# Patient Record
Sex: Male | Born: 1935 | Race: White | Hispanic: No | Marital: Married
Health system: Midwestern US, Community
[De-identification: ages and names within clinical notes are randomized; demographics above are authoritative.]

## PROBLEM LIST (undated history)

## (undated) DIAGNOSIS — N401 Enlarged prostate with lower urinary tract symptoms: Secondary | ICD-10-CM

## (undated) DIAGNOSIS — M2041 Other hammer toe(s) (acquired), right foot: Secondary | ICD-10-CM

## (undated) DIAGNOSIS — R3915 Urgency of urination: Secondary | ICD-10-CM

## (undated) DIAGNOSIS — N138 Other obstructive and reflux uropathy: Secondary | ICD-10-CM

## (undated) DIAGNOSIS — I499 Cardiac arrhythmia, unspecified: Secondary | ICD-10-CM

## (undated) DIAGNOSIS — Z8679 Personal history of other diseases of the circulatory system: Secondary | ICD-10-CM

## (undated) DIAGNOSIS — G4733 Obstructive sleep apnea (adult) (pediatric): Secondary | ICD-10-CM

## (undated) DIAGNOSIS — K219 Gastro-esophageal reflux disease without esophagitis: Secondary | ICD-10-CM

## (undated) DIAGNOSIS — I4819 Other persistent atrial fibrillation: Secondary | ICD-10-CM

## (undated) DIAGNOSIS — I629 Nontraumatic intracranial hemorrhage, unspecified: Secondary | ICD-10-CM

## (undated) DIAGNOSIS — I340 Nonrheumatic mitral (valve) insufficiency: Secondary | ICD-10-CM

## (undated) HISTORY — DX: Nontraumatic intracranial hemorrhage, unspecified: I62.9

## (undated) HISTORY — DX: Cardiac arrhythmia, unspecified: I49.9

## (undated) HISTORY — DX: Obstructive sleep apnea (adult) (pediatric): G47.33

## (undated) HISTORY — DX: Gastro-esophageal reflux disease without esophagitis: K21.9

## (undated) HISTORY — PX: SHOULDER SURGERY: SHX246

## (undated) HISTORY — PX: CRANIOTOMY: SHX93

---

## 2009-01-27 LAB — METABOLIC PANEL, COMPREHENSIVE
A-G Ratio: 1.4 (ref 0.8–1.7)
ALT (SGPT): 36 U/L (ref 30–65)
AST (SGOT): 19 U/L (ref 15–37)
Albumin: 3.9 g/dL (ref 3.4–5.0)
Alk. phosphatase: 79 U/L (ref 50–136)
Anion gap: 6 mmol/L (ref 5–15)
BUN/Creatinine ratio: 16 (ref 12–20)
BUN: 16 MG/DL (ref 7–18)
Bilirubin, total: 0.8 MG/DL (ref 0.1–0.9)
CO2: 30 MMOL/L (ref 21–32)
Calcium: 8.7 MG/DL (ref 8.4–10.4)
Chloride: 98 MMOL/L — ABNORMAL LOW (ref 100–108)
Creatinine: 1 MG/DL (ref 0.6–1.3)
GFR est AA: >60 mL/min/{1.73_m2} (ref >60)
GFR est non-AA: >60 mL/min/{1.73_m2} (ref >60)
Globulin: 2.7 g/dL (ref 2.0–4.0)
Glucose: 111 MG/DL — ABNORMAL HIGH (ref 74–99)
Potassium: 4.4 MMOL/L (ref 3.5–5.5)
Protein, total: 6.6 g/dL (ref 6.4–8.2)
Sodium: 134 MMOL/L — ABNORMAL LOW (ref 136–145)

## 2009-01-27 LAB — CBC WITH AUTOMATED DIFF
ABS. BASOPHILS: 0 10*3/uL (ref 0.0–0.1)
ABS. EOSINOPHILS: 0.2 10*3/uL (ref 0.0–0.4)
ABS. LYMPHOCYTES: 1.4 10*3/uL (ref 0.8–3.5)
ABS. MONOCYTES: 0.5 10*3/uL (ref 0–1.0)
ABS. NEUTROPHILS: 3.4 10*3/uL (ref 1.8–8.0)
BASOPHILS: 1 % (ref 0–3)
EOSINOPHILS: 3 % (ref 0–5)
HCT: 37.9 % (ref 37.0–49.0)
HGB: 12.8 g/dL — ABNORMAL LOW (ref 13.0–16.0)
LYMPHOCYTES: 25 % (ref 20–51)
MCH: 31.9 PG (ref 25.0–35.0)
MCHC: 33.8 g/dL (ref 31.0–37.0)
MCV: 94.5 FL (ref 78.0–98.0)
MONOCYTES: 9 % (ref 2–9)
MPV: 6.9 FL — ABNORMAL LOW (ref 7.4–10.4)
NEUTROPHILS: 62 % (ref 42–75)
PLATELET: 167 10*3/uL (ref 130–400)
RBC: 4.01 M/uL — ABNORMAL LOW (ref 4.50–5.30)
RDW: 13.5 % (ref 11.5–14.5)
WBC: 5.6 10*3/uL (ref 4.5–13.0)

## 2009-01-29 NOTE — Op Note (Unsigned)
[facility]   [address]   [facility] [place] [address]   OPERATIVE REPORT    PATIENT: Jose Stafford, Jose Stafford  MRN: [id number] DATE: 01/29/2009  BILLING: [id number] LOCATION: [facility]  DICTATING: Chrissie Noa, MD        SURGEON: Dr. Chrissie Noa.    SURGICAL ASSISTANT: Caffie Damme    ANESTHESIOLOGIST: Allyne Gee    PREOPERATIVE DIAGNOSIS: Chronic subacromial impingement, left shoulder,  with full-thickness rotator cuff tear.    POSTOPERATIVE DIAGNOSIS: Chronic subacromial impingement, left shoulder,  with full-thickness rotator cuff tear.    OPERATION: Open repair of rotator cuff tear, left shoulder, with  subacromial decompression, including anterior acromioplasty.    PATHOLOGY: The patient had evidence of chronic impingement occurring on  the undersurface of the anterior acromion where significant bony prominence  was present. This had a sharp leading edge and projected anteromedially as  well anterolaterally at the corner. This was impinging over the area of the  supraspinatus insertion into the greater tuberosity, and a large,  full-thickness tear had occurred. The entire supraspinatus was torn in a  portion of the anterior aspect of the supraspinatus. It was also torn and  retracted medially, creating a U- shaped tear that had a bit of an L  component to it, as the anterior flap seemed more mobile than posterior.  The biceps tendon had ruptured. The subscapular tendon was intact as was  the teres minor. Bursa was thickened and scarred. Some mild adhesions were  present. Range of motion was well preserved. The visible part of the  articular surface looked in good condition.    PROCEDURE: The patient was adequately sedated under general anesthesia  after scalene block was placed in the left shoulder. He was placed in the  beach chair position on the operating table. The left shoulder was  manipulated to make sure full motion was available. We then prepped and   draped the left shoulder in the usual sterile fashion. Time-out was  performed to confirm the correct side surgery. Incision was made as a  two-and-a-half-inch incision starting at the anterior acromion and  extending point lateral to the coracoid process. Deltoid fascia was exposed  and incised starting at the [facility] joint and extending distally for an inch and  a half. Blunt dissection of the deltoid fibers exposed the underlying  bursa, which was incised. The anterior deltoid was released off the  anterior acromion for 1.5 cm to allow good visualization of the anterior  inferior surface of the acromion. The humeral head was depressed and a  1-inch flat osteotome was used to osteotomize the anterior and inferior  one-third of the acromion to create a flat smooth undersurface. Rasp was  used to further smooth this area to prevent further impingement. We  irrigated copiously with bacitracin solution. We then carried out lysis of  adhesions and bursectomy. Rotator cuff tendon tear was identified and the  margins mobilized for repair. It was felt that an L-type repair would be  best and we first created a shallow trough for the anatomic restoration of  the site of reinsertion of the tendon. Through the trough, we placed a  series of three drill holes with a 3/32 drill bit, going through the trough  and out through the lateral cortex of the greater tuberosity. We also  placed a tendon-to-tendon figure-of-eight suture of 0 Tevdek at the  proximal corner where we were going to bring the two margins together and  excised  a small amount of eschar of rotator cuff at that site to prevent  dog-ear. Finally, we inserted a HEALIX 5.5-mm bioabsorbable suture anchor  double loaded with #2 Orthocord sutures. We passed the sutures first  through bone tunnels and through tendon, each suture going through bone and  tendon twice; then we inserted the HEALIX anchor at the apex and began   tying sutures sequentially to gradually lay the tendon down into anatomic  position. Finally, we added a second tendon-to-tendon figure-of-eight  suture of 0 Tevdek, and a very stable anatomic repair was achieved. We  irrigated again copiously with diluted solution and hemostasis was achieved  throughout the case using electrocautery. The anterior deltoid was then  repaired back to the anterior acromion with 0 Tevdek sutures through bone.  The slit in the deltoid was repaired with 2-0 Monocryl as was subcutaneous  tissue, and the skin was closed with running subcuticular stitch of 3-0  Monocryl. Steri-Strips, Xeroform, bulky soft dressing and sling applied.  The patient was taken to the recovery room in satisfactory condition.  Sponge and needle count correct.    SPECIMEN: None.    ESTIMATED BLOOD LOSS: 30 mL.                           Date:______Time:______Signature________________________________   Chrissie Noa, MD        TF:wmx  D: 01/29/2009 11:17 A T: 01/29/2009 12:58 P  BY: [id number]  Job#: [id number] CScriptDoc #: [id number]    cc: Chrissie Noa, MD

## 2010-04-25 NOTE — Progress Notes (Signed)
Elevated PSA   020909 psa 1.9; 081009 psa 2.3; 020810 psa 1.6; 161096 psa 2.2; 030111 psa 3.1; 045409 psa 2.9;   10/04/2009 chart entry - pt has fu appt with me in September having been referred by Dr Burnadette Peter per his retirement; 77 yo with normal psa but abnormal psa velocity; has bph;   11/19/2009 office encounter - Preexam psa pending, pt to call if hasn't heard psa report by two weeks; exam shows 30 gm prostate, l>r wo nodules; reassured pt no sign of cancer; copy of psa to pcp planned;   Psa 2.9, reassuring letter to patient, copy of report to Dr Donette Larry       BPH w LUTS   Early 2000's microwave treatment - successful   [id number] Burnadette Peter) notes This is a follow-up visit for Jose Stafford who has an established diagnosis of BPH with obstruction. There has been improvement of his symptoms. He reports nocturia one time a night. His current medications includes no medications. He has not experienced Problems when on these medications. Since his last visit he notes less frequency less nocturia less urgency   [id number] Burnadette Peter) notes There has been improvement of his symptoms. He reports nocturia is rare now. He is bothered by some daytime urgency, which has become more noticeable over the past 6 mos. His current medications includes no medications. Since his last visit he notes less frequency less nocturia But more daytime urgency; PLAN Sanctura trial   There has been further improvement of his symptoms. He reports nocturia is rare now, and not more than once nightly. He was bothered by some daytime urgency, but began Sanctura with good improvement.   [id number] Burnadette Peter) notes His current medications includes no BPH medications. He continues on Reunion, with good results.    [id number] Burnadette Peter) notes There has been improvement of his symptoms. He reports nocturia one or two times a night. He is emptying well, with a PVR of 310 cc today. He is reasonably happy with his voiding despite this. His current medications includes Sanctura 60 mgm qHS. He has not experienced side effects on this medication.   [id number] [name]) notes irritable sxs controlled with sanctura, notes 40 gm benign prostate   10/04/2009 chart entry - chart reviewed, note pt has rx for sudafed and is on sanctura with 40 gm prostate - will discuss impact of this rx on bph sxs and increased pvr;   11/19/2009 office encounter - bph surveillance, reviewed risk of sudafed with bph - uses rarely; remains on sanctura xr on empty stomach, pleased with bladder control; discussed trial of cessation if he would like since doing; denies hematuria or dysuria; urine is dip and micro negative, exam shows 30 gm benign prostate l>r wo Nodules; ok to trial cesation of sanctura if he'd like       IBE   POST VOID RESIDUAL  10/22/2007  04/21/2008  10/20/2008    PVR  72ml  58cc  310 (A)    11/19/2009 office encounter - auass 4, pvr 45 cc, pt wo bother       Family Hx of Prostate Cancer   11/19/2009 office encounter - reports 3 brothers with pca

## 2010-04-27 ENCOUNTER — Encounter

## 2010-04-27 NOTE — Telephone Encounter (Addendum)
New insurance, sanctura too expensive, needs an alternative  DEBBIE, YOU KNOW THE DRILL!!!!!     Please get list of approved drugs in class of sanctura from his insurance company for [place] to choose from!!!!!!!!!!!!!!!!      Update his allergy list!

## 2010-04-30 NOTE — Telephone Encounter (Signed)
Jose Stafford has scanned the list for the pt and has put it on your desk.

## 2010-05-01 MED ORDER — SOLIFENACIN 10 MG TAB
10 mg | ORAL_TABLET | Freq: Every day | ORAL | Status: DC
Start: 2010-05-01 — End: 2010-05-06

## 2010-05-01 NOTE — Telephone Encounter (Signed)
Elevated PSA   020909 psa 1.9; 081009 psa 2.3; 020810 psa 1.6; 098119 psa 2.2; 030111 psa 3.1; 147829 psa 2.9;   10/04/2009 chart entry - pt has fu appt with me in September having been referred by Dr Burnadette Peter per his retirement; 77 yo with normal psa but abnormal psa velocity; has bph;   11/19/2009 office encounter - Preexam psa pending, pt to call if hasn't heard psa report by two weeks; exam shows 30 gm prostate, l>r wo nodules; reassured pt no sign of cancer; copy of psa to pcp planned;   Psa 2.9, reassuring letter to patient, copy of report to Dr Donette Larry       BPH w LUTS   Early 2000's microwave treatment - successful   [id number] Burnadette Peter) notes This is a follow-up visit for Jose Stafford who has an established diagnosis of BPH with obstruction. There has been improvement of his symptoms. He reports nocturia one time a night. His current medications includes no medications. He has not experienced Problems when on these medications. Since his last visit he notes less frequency less nocturia less urgency   [id number] Burnadette Peter) notes There has been improvement of his symptoms. He reports nocturia is rare now. He is bothered by some daytime urgency, which has become more noticeable over the past 6 mos. His current medications includes no medications. Since his last visit he notes less frequency less nocturia But more daytime urgency; PLAN Sanctura trial   There has been further improvement of his symptoms. He reports nocturia is rare now, and not more than once nightly. He was bothered by some daytime urgency, but began Sanctura with good improvement.   [id number] Burnadette Peter) notes His current medications includes no BPH medications. He continues on Reunion, with good results.    [id number] Burnadette Peter) notes There has been improvement of his symptoms. He reports nocturia one or two times a night. He is emptying well, with a PVR of 310 cc today. He is reasonably happy with his voiding despite this. His current medications includes Sanctura 60 mgm qHS. He has not experienced side effects on this medication.   [id number] [name]) notes irritable sxs controlled with sanctura, notes 40 gm benign prostate   10/04/2009 chart entry - chart reviewed, note pt has rx for sudafed and is on sanctura with 40 gm prostate - will discuss impact of this rx on bph sxs and increased pvr;   11/19/2009 office encounter - bph surveillance, reviewed risk of sudafed with bph - uses rarely; remains on sanctura xr on empty stomach, pleased with bladder control; discussed trial of cessation if he would like since doing; denies hematuria or dysuria; urine is dip and micro negative, exam shows 30 gm benign prostate l>r wo Nodules; ok to trial cesation of sanctura if he'd like   05/01/2010 chart entry ??? changed insurance needs alternative for sanctura since not on his insurance approved list, meds listed include oxybutynin, oxybutynin er 5, 10, 15, enablex, gelnique, vesicare; will go with vesicare 10 mg #30 rfx11, sent to [facility] pharmacy      IBE   POST VOID RESIDUAL  10/22/2007  04/21/2008  10/20/2008    PVR  72ml  58cc  310   11/19/2009 office encounter - auass 4, pvr 45 cc, pt wo bother       Family Hx of Prostate Cancer   11/19/2009 office encounter - reports 3 brothers with pca

## 2010-05-03 NOTE — Telephone Encounter (Signed)
Pt to pu meds

## 2010-05-04 ENCOUNTER — Telehealth

## 2010-05-04 NOTE — Telephone Encounter (Addendum)
vesicare is too expensive for him.  Can we try something else?

## 2010-05-06 MED ORDER — OXYBUTYNIN CHLORIDE SR 10 MG 24 HR TAB
10 mg | ORAL_TABLET | Freq: Every day | ORAL | Status: DC
Start: 2010-05-06 — End: 2010-05-27

## 2010-05-06 NOTE — Telephone Encounter (Signed)
Addended by: Leone Payor D on: 05/06/2010 05:57 AM     Modules accepted: Orders

## 2010-05-06 NOTE — Telephone Encounter (Signed)
Elevated PSA   020909 psa 1.9; 081009 psa 2.3; 020810 psa 1.6; 161096 psa 2.2; 030111 psa 3.1; 045409 psa 2.9;   10/04/2009 chart entry - pt has fu appt with me in September having been referred by Dr Burnadette Peter per his retirement; 77 yo with normal psa but abnormal psa velocity; has bph;   11/19/2009 office encounter - Preexam psa pending, pt to call if hasn't heard psa report by two weeks; exam shows 30 gm prostate, l>r wo nodules; reassured pt no sign of cancer; copy of psa to pcp planned;   Psa 2.9, reassuring letter to patient, copy of report to Dr Donette Larry       BPH w LUTS   Early 2000's microwave treatment - successful   [id number] Burnadette Peter) notes This is a follow-up visit for Jose Stafford who has an established diagnosis of BPH with obstruction. There has been improvement of his symptoms. He reports nocturia one time a night. His current medications includes no medications. He has not experienced Problems when on these medications. Since his last visit he notes less frequency less nocturia less urgency   [id number] Burnadette Peter) notes There has been improvement of his symptoms. He reports nocturia is rare now. He is bothered by some daytime urgency, which has become more noticeable over the past 6 mos. His current medications includes no medications. Since his last visit he notes less frequency less nocturia But more daytime urgency; PLAN Sanctura trial   There has been further improvement of his symptoms. He reports nocturia is rare now, and not more than once nightly. He was bothered by some daytime urgency, but began Sanctura with good improvement.   [id number] Burnadette Peter) notes His current medications includes no BPH medications. He continues on Reunion, with good results.    [id number] Burnadette Peter) notes There has been improvement of his symptoms. He reports nocturia one or two times a night. He is emptying well, with a PVR of 310 cc today. He is reasonably happy with his voiding despite this. His current medications includes Sanctura 60 mgm qHS. He has not experienced side effects on this medication.   [id number] [name]) notes irritable sxs controlled with sanctura, notes 40 gm benign prostate   10/04/2009 chart entry - chart reviewed, note pt has rx for sudafed and is on sanctura with 40 gm prostate - will discuss impact of this rx on bph sxs and increased pvr;   11/19/2009 office encounter - bph surveillance, reviewed risk of sudafed with bph - uses rarely; remains on sanctura xr on empty stomach, pleased with bladder control; discussed trial of cessation if he would like since doing; denies hematuria or dysuria; urine is dip and micro negative, exam shows 30 gm benign prostate l>r wo Nodules; ok to trial cesation of sanctura if he'd like   05/01/2010 chart entry ??? changed insurance needs alternative for sanctura since not on his insurance approved list, meds listed include oxybutynin, oxybutynin er 5, 10, 15, enablex, gelnique, vesicare; will go with vesicare 10 mg #30 rfx11, sent to [facility] pharmacy  05/06/2010 chart entry ??? vesicare too expensive, pt wants generic, pt advised of short term memory loss associated with oxybutynin; Rx oxbutynin er 10 mg daily #30 rfx11      IBE   POST VOID RESIDUAL  10/22/2007  04/21/2008  10/20/2008    PVR  72ml  58cc  310   11/19/2009 office encounter - auass 4, pvr 45 cc, pt wo bother       Family Hx of Prostate  Cancer   11/19/2009 office encounter - reports 3 brothers with pca

## 2010-05-10 NOTE — Telephone Encounter (Signed)
Pt aware of short term memory loss with oxybutynin.  Pt still going to try

## 2010-05-20 NOTE — Progress Notes (Signed)
psa drawn today w/o complications

## 2010-05-21 NOTE — Progress Notes (Addendum)
Elevated PSA   020909 psa 1.9; 081009 psa 2.3; 020810 psa 1.6; 454098 psa 2.2; 030111 psa 3.1; 119147 psa 2.9; 829562 psa 2.23;   10/04/2009 chart entry - pt has fu appt with me in September having been referred by Dr Burnadette Peter per his retirement; 77 yo with normal psa but abnormal psa velocity; has bph;   11/19/2009 office encounter - Preexam psa pending, pt to call if hasn't heard psa report by two weeks; exam shows 30 gm prostate, l>r wo nodules; reassured pt no sign of cancer; copy of psa to pcp planned;   Psa 2.9, reassuring letter to patient, copy of report to Dr Donette Larry   05/20/2010 medical assistant encounter - psa draw 2.23;  appt near future        BPH w LUTS   Early 2000's microwave treatment - successful   [id number] Burnadette Peter) notes This is a follow-up visit for Jose Stafford who has an established diagnosis of BPH with obstruction. There has been improvement of his symptoms. He reports nocturia one time a night. His current medications includes no medications. He has not experienced Problems when on these medications. Since his last visit he notes less frequency less nocturia less urgency   [id number] Burnadette Peter) notes There has been improvement of his symptoms. He reports nocturia is rare now. He is bothered by some daytime urgency, which has become more noticeable over the past 6 mos. His current medications includes no medications. Since his last visit he notes less frequency less nocturia But more daytime urgency; PLAN Sanctura trial   There has been further improvement of his symptoms. He reports nocturia is rare now, and not more than once nightly. He was bothered by some daytime urgency, but began Sanctura with good improvement.   [id number] Burnadette Peter) notes His current medications includes no BPH medications. He continues on Reunion, with good results.    [id number] Burnadette Peter) notes There has been improvement of his symptoms. He reports nocturia one or two times a night. He is emptying well, with a PVR of 310 cc today. He is reasonably happy with his voiding despite this. His current medications includes Sanctura 60 mgm qHS. He has not experienced side effects on this medication.   [id number] [name]) notes irritable sxs controlled with sanctura, notes 40 gm benign prostate   10/04/2009 chart entry - chart reviewed, note pt has rx for sudafed and is on sanctura with 40 gm prostate - will discuss impact of this rx on bph sxs and increased pvr;   11/19/2009 office encounter - bph surveillance, reviewed risk of sudafed with bph - uses rarely; remains on sanctura xr on empty stomach, pleased with bladder control; discussed trial of cessation if he would like since doing; denies hematuria or dysuria; urine is dip and micro negative, exam shows 30 gm benign prostate l>r wo Nodules; ok to trial cesation of sanctura if he'd like   05/01/2010 chart entry ??? changed insurance needs alternative for sanctura since not on his insurance approved list, meds listed include oxybutynin, oxybutynin er 5, 10, 15, enablex, gelnique, vesicare; will go with vesicare 10 mg #30 rfx11, sent to [facility] pharmacy  05/06/2010 chart entry ??? vesicare too expensive, pt wants generic, pt advised of short term memory loss associated with oxybutynin; Rx oxbutynin er 10 mg daily #30 rfx11      IBE   POST VOID RESIDUAL  10/22/2007  04/21/2008  10/20/2008    PVR  72ml  58cc  310   11/19/2009 office encounter -  auass 4, pvr 45 cc, pt wo bother       Family Hx of Prostate Cancer   11/19/2009 office encounter - reports 3 brothers with pca

## 2010-05-27 LAB — AMB POC URINALYSIS DIP STICK AUTO W/ MICRO (MICRO RESULTS)
Bacteria (UA POC): 0
Bilirubin (UA POC): NEGATIVE
Blood (UA POC): NEGATIVE
Epithelial cells (UA POC): 0
Glucose (UA POC): NEGATIVE
Ketones (UA POC): NEGATIVE
Leukocyte esterase (UA POC): NEGATIVE
Nitrites (UA POC): NEGATIVE
Protein (UA POC): NEGATIVE mg/dL
RBCs (UA POC): 0
Specific gravity (UA POC): 1.02 (ref 1.001–1.035)
Urobilinogen (UA POC): 0.2
WBCs (UA POC): 0
pH (UA POC): 5.5 (ref 4.6–8.0)

## 2010-05-27 NOTE — Patient Instructions (Signed)
MyChart Activation    Thank you for requesting access to MyChart. Please follow the instructions below to securely access and download your online medical record. MyChart allows you to send messages to your doctor, view your test results, renew your prescriptions, schedule appointments, and more.    How Do I Sign Up?    1. In your internet browser, go to [email or web address].  2. Click on the First Time User? Click Here link in the Sign In box. You will see the New Member Sign Up page.  3. Enter your MyChart Access Code exactly as it appears below. You will not need to use this code after you???ve completed the sign-up process. If you do not sign up before the expiration date, you must request a new code.    MyChart Access Code: [email or web address]  Expires: 08/25/2010  9:01 AM (This is the date your MyChart access code will expire)    4. Enter the last four digits of your Social Security Number (xxxx) and Date of Birth (mm/dd/yyyy) as indicated and click Submit. You will be taken to the next sign-up page.  5. Create a MyChart ID. This will be your MyChart login ID and cannot be changed, so think of one that is secure and easy to remember.  6. Create a MyChart password. You can change your password at any time.  7. Enter your Password Reset Question and Answer. This can be used at a later time if you forget your password.   8. Enter your e-mail address. You will receive e-mail notification when new information is available in MyChart.  9. Click Sign Up. You can now view and download portions of your medical record.  10. Click the Download Summary menu link to download a portable copy of your medical information.    Additional Information    If you have questions, please call [phone]. Remember, MyChart is NOT to be used for urgent needs. For medical emergencies, dial 911.

## 2010-05-27 NOTE — Progress Notes (Signed)
Jose Heys, MD  [facility]  [address]  [place], [place] [address]  [phone]      ASSESSMENT/PLAN    Encounter Diagnoses   Name Primary?   ??? BPH w urinary obs/LUTS Yes   ??? Urgency of urination    ??? Family hx of prostate cancer    ??? Elevated PSA      Assessment:  psa appropriate for age and prostate size in pt anxious about his family hx of pca; bph sxs controlled with vesicare  Plan:  Titrate to minimum effective dose of vesicare; six month surveillance continue      HISTORY OF PRESENT ILLNESS  He is currently 77 y.o. and his primary care provider is Jose Stafford.    Elevated PSA   020909 psa 1.9; 081009 psa 2.3; 020810 psa 1.6; 914782 psa 2.2; 030111 psa 3.1; 956213 psa 2.9; 086578 psa 2.23;   10/04/2009 chart entry - pt has fu appt with me in September having been referred by Jose Stafford per his retirement; 77 yo with normal psa but abnormal psa velocity; has bph;   11/19/2009 office encounter - Preexam psa pending, pt to call if hasn't heard psa report by two weeks; exam shows 30 gm prostate, l>r wo nodules; reassured pt no sign of cancer; copy of psa to pcp planned;   Psa 2.9, reassuring letter to patient, copy of report to Jose Stafford   05/20/2010 medical assistant encounter - psa draw 2.23;  appt near future  05/27/2010 office encounter  - elev psa surveillance in pt with family hx of sig pca; psa range is 1.9 - 3.1, present 2.23; pt anxious about his psa and family hx wants six month checks; urine is micro benign; exam shows 30 gm prostate r>l wo nodules or induration; reassured pt no sign of prostate cancer but at risk due to age and family hx; reassured him pca death unlikely for him;       BPH w LUTS   Early 2000's microwave treatment - successful    [id number] Burnadette Stafford) notes This is a follow-up visit for Jose Stafford who has an established diagnosis of BPH with obstruction. There has been improvement of his symptoms. He reports nocturia one time a night. His current medications includes no medications. He has not experienced Problems when on these medications. Since his last visit he notes less frequency less nocturia less urgency   [id number] Burnadette Stafford) notes There has been improvement of his symptoms. He reports nocturia is rare now. He is bothered by some daytime urgency, which has become more noticeable over the past 6 mos. His current medications includes no medications. Since his last visit he notes less frequency less nocturia But more daytime urgency; PLAN Sanctura trial   There has been further improvement of his symptoms. He reports nocturia is rare now, and not more than once nightly. He was bothered by some daytime urgency, but began Sanctura with good improvement.   [id number] Burnadette Stafford) notes His current medications includes no BPH medications. He continues on Reunion, with good results.   [id number] Burnadette Stafford) notes There has been improvement of his symptoms. He reports nocturia one or two times a night. He is emptying well, with a PVR of 310 cc today. He is reasonably happy with his voiding despite this. His current medications includes Sanctura 60 mgm qHS. He has not experienced side effects on this medication.   [id number] Jose Stafford) notes irritable sxs controlled with sanctura, notes 40 gm  benign prostate   10/04/2009 chart entry - chart reviewed, note pt has rx for sudafed and is on sanctura with 40 gm prostate - will discuss impact of this rx on bph sxs and increased pvr;    11/19/2009 office encounter - bph surveillance, reviewed risk of sudafed with bph - uses rarely; remains on sanctura xr on empty stomach, pleased with bladder control; discussed trial of cessation if he would like since doing; denies hematuria or dysuria; urine is dip and micro negative, exam shows 30 gm benign prostate l>r wo Nodules; ok to trial cesation of sanctura if he'd like   05/01/2010 chart entry ??? changed insurance needs alternative for sanctura since not on his insurance approved list, meds listed include oxybutynin, oxybutynin er 5, 10, 15, enablex, gelnique, vesicare; will go with vesicare 10 mg #30 rfx11, sent to [facility] pharmacy  05/06/2010 chart entry ??? vesicare too expensive, pt wants generic, pt advised of short term memory loss associated with oxybutynin; Rx oxbutynin er 10 mg daily #30 rfx11  05/27/2010 office encounter  - pt opted to stay w vesicare and avoid side effects with the oxybutynin; his AUA symptom score (detailed in the documents flowsheet) is only 2 on vesicare; pt pleased; will titrate dose to every other and then every third; whatever minimum dosage is effective;       IBE   POST VOID RESIDUAL  10/22/2007  04/21/2008  10/20/2008    PVR  72ml  58cc  310   11/19/2009 office encounter - auass 4, pvr 45 cc, pt wo bother   05/27/2010 office encounter  - auass 2, under influence of vesicare      Family Hx of Prostate Cancer   11/19/2009 office encounter - reports 3 brothers with pca      PAST MEDICAL HISTORY    Current Outpatient Prescriptions   Medication Sig Dispense Refill   ??? azelastine (ASTELIN) 137 mcg nasal spray 1 Spray two (2) times a day. Use in each nostril as directed        ??? ipratropium (ATROVENT) 0.02 % nebulizer solution 0.5 mg by Nebulization route as needed.         ??? cyanocobalamin 1,000 mcg tablet Take 1,000 mcg by mouth daily.         ??? Calcium Carbonate-Vit D3-Min (CALTRATE 600+D PLUS MINERALS) 600 mg (1,500 mg)-400 unit Chew Take  by mouth.          ??? ferrous sulfate (IRON) 325 mg (65 mg elemental iron) tablet Take  by mouth Daily (before breakfast).         ??? ascorbic acid (VITAMIN C) 250 mg tablet Take  by mouth.         ??? clonazePAM (KLONOPIN) 0.5 mg tablet Take  by mouth nightly as needed.         ??? esomeprazole (NEXIUM) 20 mg capsule Take 40 mg by mouth daily.         ??? simvastatin (ZOCOR) 10 mg tablet Take  by mouth nightly.         ??? montelukast (SINGULAIR) 10 mg tablet Take 10 mg by mouth daily.         ??? Cetirizine (ZYRTEC) 10 mg Cap Take  by mouth.         ??? citalopram (CELEXA) 40 mg tablet Take 40 mg by mouth daily.         ??? MULTIVITAMIN (MULTIPLE VITAMINS PO) Take  by mouth.         ???  aspirin delayed-release 81 mg tablet Take  by mouth daily.         ??? solifenacin (VESICARE) 10 mg tablet Take 10 mg by mouth daily.         ??? GUAIFENESIN (MUCINEX PO) Take  by mouth.           Allergies   Allergen Reactions   ??? Sulfa (Sulfonamide Antibiotics) Unknown (comments)         REVIEW OF SYSTEMS  Complete systems review with patient who reports no change since last complete ROS dated 030812 and scanned to chart  Constitutional: Neg for fever, neg for chills, neg for weight loss, neg for change in appetite, neg for changes in energy level  Eyes: Neg for visual disturbance, neg for pain, neg for discharge  ENT: Neg for difficulty speaking, neg for pain with swallowing, neg for hearing difficulty  Respiratory: Neg for Shortness of breath, chronic cough with sputum, neg for hemoptysis  Cardiac: Neg for chest pain, neg for rapid heartbeat, occl irregular heartbeat  GU: Neg for history of kidney stones, neg for frequent UTI, neg for flank pain  GI: Neg for constipation, neg for diarrhea, neg for melena, neg for hematochezia  MSK: Neg for bone pain, some aging muscular weakness, neg for muscular tenderness  Skin: Neg for skin conditions, neg for skin rashes  Neurologic: Neg for focal weakness, neg for numbness, neg for seizures   Psychiatric: Neg for history of psychiatric illness  Endocrine: Neg for diabetes, neg for thyroid disease, neg for excessive urination, neg for excessive thirst, neg for heat intolerance  Lymph: Neg for frequent infections, has easy bruising, neg for  lymph node enlargement    PHYSICAL EXAM  BP 90/40   Ht 5\' 8"  (1.727 m)   Wt 166 lb (75.297 kg)   BMI 25.24 kg/m2    GENERAL APPEARANCE:  77 y.o. Caucasian  male alert, cooperative, no distress, appears stated age not obese;  ABDOMEN:  rounded soft, non-tender. Bowel sounds normal. No masses,  no organomegaly  BACK:  symmetric, no curvature. ROM normal. No CVA tenderness.  GENITOURINARY:    Penis:  normal, noncircumcised  Scrotum:  normal  Testes:  normal, no masses, size 20cc  Epididymides:  normal  Anus/Perineum:  within normal limits  Sphincter tone:  within normal limits  Rectum:  negative without mass, lesions or tenderness    Prostate:  30 gm prostate r>l wo nodules or induration  Seminal vesicles:  nonpalpable  LYMPHATIC:  Cervical, supraclavicular, and axillary nodes normal.    GU DATA    Results for orders placed in visit on 05/27/10   AMB POC URINALYSIS DIP STICK AUTO W/ MICRO (MICRO RESULTS)       Component Value Range    Color Yellow  (none)     Clarity Clear  (none)     Glucose Negative  (none)     Bilirubin Negative  (none)     Ketones Negative  (none)     Spec.Grav. 1.020  1.001 - 1.035     Blood Negative  (none)     pH 5.5  4.6 - 8.0     Protein n  ZOX:[id number](JW/JX)    Urobilinogen 0.2 mg/dL      Nitrites Negative  (none)     Leukocyte esterase Negative  (none)     Epithelial cells 0      WBCs 0      RBCs 0      Bacteria 0  Crystals        Other           AUA Assessment Score:  AUA Score: 2 ;    AUA Bother Rating: Pleased   His  AUASS symptoms are detailed in the document flow sheet

## 2010-11-30 NOTE — Progress Notes (Signed)
Blood drawn by venipuncture for psa

## 2010-12-02 LAB — PSA, DIAGNOSTIC (PROSTATE SPECIFIC AG): Prostate Specific Ag: 2.5 ng/mL (ref 0.00–4.00)

## 2010-12-05 NOTE — Progress Notes (Signed)
Elevated PSA   020909 psa 1.9; 081009 psa 2.3; 020810 psa 1.6; 161096 psa 2.2; 030111 psa 3.1; 045409 psa 2.9; 811914 psa 2.23; 782956 psa 2.5;    10/04/2009 chart entry - pt has fu appt with me in September having been referred by Dr Burnadette Peter per his retirement; 77 yo with normal psa but abnormal psa velocity; has bph;   11/19/2009 office encounter - Preexam psa pending, pt to call if hasn't heard psa report by two weeks; exam shows 30 gm prostate, l>r wo nodules; reassured pt no sign of cancer; copy of psa to pcp planned;   Psa 2.9, reassuring letter to patient, copy of report to Dr Donette Larry   05/20/2010 medical assistant encounter - psa draw 2.23;  appt near future  05/27/2010 office encounter  - elev psa surveillance in pt with family hx of sig pca; psa range is 1.9 - 3.1, present 2.23; pt anxious about his psa and family hx wants six month checks; urine is micro benign; exam shows 30 gm prostate r>l wo nodules or induration; reassured pt no sign of prostate cancer but at risk due to age and family hx; reassured him pca death unlikely for him;   Dec 04, 2010 medical assistant encounter - psa draw, reassuring stable value of 2.5; appt near future      BPH w LUTS   Early 2000's microwave treatment - successful   [id number] Burnadette Peter) notes This is a follow-up visit for Jose Stafford who has an established diagnosis of BPH with obstruction. There has been improvement of his symptoms. He reports nocturia one time a night. His current medications includes no medications. He has not experienced Problems when on these medications. Since his last visit he notes less frequency less nocturia less urgency    [id number] Burnadette Peter) notes There has been improvement of his symptoms. He reports nocturia is rare now. He is bothered by some daytime urgency, which has become more noticeable over the past 6 mos. His current medications includes no medications. Since his last visit he notes less frequency less nocturia But more daytime urgency; PLAN Sanctura trial   There has been further improvement of his symptoms. He reports nocturia is rare now, and not more than once nightly. He was bothered by some daytime urgency, but began Sanctura with good improvement.   [id number] Burnadette Peter) notes His current medications includes no BPH medications. He continues on Reunion, with good results.   [id number] Burnadette Peter) notes There has been improvement of his symptoms. He reports nocturia one or two times a night. He is emptying well, with a PVR of 310 cc today. He is reasonably happy with his voiding despite this. His current medications includes Sanctura 60 mgm qHS. He has not experienced side effects on this medication.   [id number] [name]) notes irritable sxs controlled with sanctura, notes 40 gm benign prostate   10/04/2009 chart entry - chart reviewed, note pt has rx for sudafed and is on sanctura with 40 gm prostate - will discuss impact of this rx on bph sxs and increased pvr;   11/19/2009 office encounter - bph surveillance, reviewed risk of sudafed with bph - uses rarely; remains on sanctura xr on empty stomach, pleased with bladder control; discussed trial of cessation if he would like since doing; denies hematuria or dysuria; urine is dip and micro negative, exam shows 30 gm benign prostate l>r wo Nodules; ok to trial cesation of sanctura if he'd like   05/01/2010 chart entry ??? changed insurance needs alternative for sanctura  since not on his insurance approved list, meds listed include oxybutynin, oxybutynin er 5, 10, 15, enablex, gelnique, vesicare; will go with vesicare 10 mg #30 rfx11, sent to [facility] pharmacy   05/06/2010 chart entry ??? vesicare too expensive, pt wants generic, pt advised of short term memory loss associated with oxybutynin; Rx oxbutynin er 10 mg daily #30 rfx11  05/27/2010 office encounter  - pt opted to stay w vesicare and avoid side effects with the oxybutynin; his AUA symptom score (detailed in the documents flowsheet) is only 2 on vesicare; pt pleased; will titrate dose to every other and then every third; whatever minimum dosage is effective;       IBE   POST VOID RESIDUAL  10/22/2007  04/21/2008  10/20/2008    PVR  72ml  58cc  310   11/19/2009 office encounter - auass 4, pvr 45 cc, pt wo bother   05/27/2010 office encounter  - auass 2, under influence of vesicare      Family Hx of Prostate Cancer   11/19/2009 office encounter - reports 3 brothers with pca

## 2010-12-06 LAB — AMB POC URINALYSIS DIP STICK AUTO W/ MICRO (MICRO RESULTS)
Bacteria (UA POC): NEGATIVE
Bilirubin (UA POC): NEGATIVE
Blood (UA POC): NEGATIVE
Glucose (UA POC): NEGATIVE
Ketones (UA POC): NEGATIVE
Leukocyte esterase (UA POC): NEGATIVE
Nitrites (UA POC): NEGATIVE
Protein (UA POC): NEGATIVE mg/dL
RBCs (UA POC): NEGATIVE
Specific gravity (UA POC): 1.01 (ref 1.001–1.035)
Urobilinogen (UA POC): 0.2
WBCs (UA POC): NEGATIVE
pH (UA POC): 6.5 (ref 4.6–8.0)

## 2010-12-06 LAB — AMB POC PVR, MEAS,POST-VOID RES,US,NON-IMAGING: PVR: 102 cc

## 2010-12-06 NOTE — Progress Notes (Signed)
Jose Heys, MD  [facility] [place]  [address]  [place], [place] [address]  [phone]      ASSESSMENT/PLAN    Encounter Diagnoses   Name Primary?   ??? BPH w urinary obs/LUTS Yes   ??? Urgency of urination    ??? Family hx of prostate cancer        Assessment:  Strong fam hx of pca - brothers, son nephews, high risk for disease from fam hx; psa variable but has stabilized at 2.5;   Plan:  psa in six months, if ok then will recheck psa and exam a year from now;       HISTORY OF PRESENT ILLNESS  He is currently 77 y.o. and his primary care provider is HERNANI VALERIO.    Elevated PSA   020909 psa 1.9; 081009 psa 2.3; 020810 psa 1.6; 914782 psa 2.2; 030111 psa 3.1; 956213 psa 2.9; 086578 psa 2.23; 469629 psa 2.5;    10/04/2009 chart entry - pt has fu appt with me in September having been referred by Dr Burnadette Peter per his retirement; 77 yo with normal psa but abnormal psa velocity; has bph;   11/19/2009 office encounter - Preexam psa pending, pt to call if hasn't heard psa report by two weeks; exam shows 30 gm prostate, l>r wo nodules; reassured pt no sign of cancer; copy of psa to pcp planned;   Psa 2.9, reassuring letter to patient, copy of report to Dr Donette Larry   05/20/2010 medical assistant encounter - psa draw 2.23;  appt near future  05/27/2010 office encounter  - elev psa surveillance in pt with family hx of sig pca; psa range is 1.9 - 3.1, present 2.23; pt anxious about his psa and family hx wants six month checks; urine is micro benign; exam shows 30 gm prostate r>l wo nodules or induration; reassured pt no sign of prostate cancer but at risk due to age and family hx; reassured him pca death unlikely for him;   2010-12-26 medical assistant encounter - psa draw, reassuring stable value of 2.5; appt near future   12/06/2010 office encounter  - elevated psa hx in pt with strong fam hx of pca - brothers and his son and 3 nephews all with pca; reviewed his psa hx which is reassuring with stable value of 2.5; pt wo sxs; feels great - even though 8 he wishes active surveillance; exam shows 30 gm prostate r>l wo nodules or induration; will continue six month surveillance      BPH w LUTS   Early 2000's microwave treatment - successful   [id number] Burnadette Peter) notes This is a follow-up visit for Jose Stafford who has an established diagnosis of BPH with obstruction. There has been improvement of his symptoms. He reports nocturia one time a night. His current medications includes no medications. He has not experienced Problems when on these medications. Since his last visit he notes less frequency less nocturia less urgency   [id number] Burnadette Peter) notes There has been improvement of his symptoms. He reports nocturia is rare now. He is bothered by some daytime urgency, which has become more noticeable over the past 6 mos. His current medications includes no medications. Since his last visit he notes less frequency less nocturia But more daytime urgency; PLAN Sanctura trial   There has been further improvement of his symptoms. He reports nocturia is rare now, and not more than once nightly. He was bothered by some daytime urgency, but began Reunion  with good improvement.   [id number] Burnadette Peter) notes His current medications includes no BPH medications. He continues on Reunion, with good results.   [id number] Burnadette Peter) notes There has been improvement of his symptoms. He reports nocturia one or two times a night. He is emptying well, with a PVR of 310 cc today. He is reasonably happy with his voiding despite this. His current medications includes Sanctura 60 mgm qHS. He has not experienced side effects on this medication.   [id number] [name]) notes irritable sxs controlled with sanctura, notes 40 gm benign prostate    10/04/2009 chart entry - chart reviewed, note pt has rx for sudafed and is on sanctura with 40 gm prostate - will discuss impact of this rx on bph sxs and increased pvr;   11/19/2009 office encounter - bph surveillance, reviewed risk of sudafed with bph - uses rarely; remains on sanctura xr on empty stomach, pleased with bladder control; discussed trial of cessation if he would like since doing; denies hematuria or dysuria; urine is dip and micro negative, exam shows 30 gm benign prostate l>r wo Nodules; ok to trial cesation of sanctura if he'd like   05/01/2010 chart entry ??? changed insurance needs alternative for sanctura since not on his insurance approved list, meds listed include oxybutynin, oxybutynin er 5, 10, 15, enablex, gelnique, vesicare; will go with vesicare 10 mg #30 rfx11, sent to [facility] pharmacy  05/06/2010 chart entry ??? vesicare too expensive, pt wants generic, pt advised of short term memory loss associated with oxybutynin; Rx oxbutynin er 10 mg daily #30 rfx11  05/27/2010 office encounter  - pt opted to stay w vesicare and avoid side effects with the oxybutynin; his AUA symptom score (detailed in the documents flowsheet) is only 2 on vesicare; pt pleased; will titrate dose to every other and then every third; whatever minimum dosage is effective;   12/06/2010 office encounter  - weaned off vesicare, his AUA symptom score (detailed in the documents flowsheet) is 7; denies hematuria or dysuria; ua dip and micro negative;       IBE   POST VOID RESIDUAL  10/22/2007  04/21/2008  10/20/2008    PVR  72ml  58cc  310   11/19/2009 office encounter - auass 4, pvr 45 cc, pt wo bother   05/27/2010 office encounter  - auass 2, under influence of vesicare  12/06/2010 office encounter  - auass 7, has stopped vesicare, pvr 10cc      Family Hx of Prostate Cancer   11/19/2009 office encounter - reports 3 brothers with pca  12/06/2010 office encounter  - now his 66 yo son has been diagnosed with pca;     PAST MEDICAL HISTORY     Current Outpatient Prescriptions   Medication Sig Dispense Refill   ??? omeprazole (PRILOSEC OTC) 20 mg tablet Take 20 mg by mouth daily.         ??? azelastine (ASTELIN) 137 mcg nasal spray 1 Spray two (2) times a day. Use in each nostril as directed        ??? cyanocobalamin 1,000 mcg tablet Take 1,000 mcg by mouth daily.         ??? Calcium Carbonate-Vit D3-Min (CALTRATE 600+D PLUS MINERALS) 600 mg (1,500 mg)-400 unit Chew Take  by mouth.         ??? simvastatin (ZOCOR) 10 mg tablet Take  by mouth nightly.         ??? montelukast (SINGULAIR) 10 mg tablet Take 10 mg by mouth daily.         ???  Cetirizine (ZYRTEC) 10 mg Cap Take  by mouth.         ??? aspirin delayed-release 81 mg tablet Take  by mouth daily.         ??? GUAIFENESIN (MUCINEX PO) Take  by mouth.           Allergies   Allergen Reactions   ??? Sulfa(Sulfonamide Antibiotics) Unknown (comments)       REVIEW OF SYSTEMS  Complete systems review with patient who reports no change since last complete ROS dated 030812 and scanned to chart  Constitutional: Neg for fever, neg for chills, neg for weight loss, neg for change in appetite, neg for changes in energy level  Eyes: Neg for visual disturbance, neg for pain, neg for discharge  ENT: Neg for difficulty speaking, neg for pain with swallowing, neg for hearing difficulty  Respiratory: Neg for Shortness of breath, chronic cough with sputum, neg for hemoptysis  Cardiac: Neg for chest pain, neg for rapid heartbeat, occl irregular heartbeat  GU: Neg for history of kidney stones, neg for frequent UTI, neg for flank pain  GI: Neg for constipation, neg for diarrhea, neg for melena, neg for hematochezia  MSK: Neg for bone pain, some aging muscular weakness, neg for muscular tenderness  Skin: Neg for skin conditions, neg for skin rashes  Neurologic: Neg for focal weakness, neg for numbness, neg for seizures  Psychiatric: Neg for history of psychiatric illness   Endocrine: Neg for diabetes, neg for thyroid disease, neg for excessive urination, neg for excessive thirst, neg for heat intolerance  Lymph: Neg for frequent infections, has easy bruising, neg for  lymph node enlargement    PHYSICAL EXAM  BP 120/80   Ht 5\' 7"  (1.702 m)   Wt 158 lb (71.668 kg)   BMI 24.75 kg/m2  Discussed the patient's BMI with him.  The BMI follow up plan is as follows: BMI is in an acceptable range.    GENERAL APPEARANCE:  77 y.o. Caucasian  male alert, cooperative, no distress, appears stated age not obese;  ABDOMEN:  flat soft, non-tender. Bowel sounds normal. No masses,  no organomegaly  BACK:  symmetric, no curvature. ROM normal. No CVA tenderness.  GENITOURINARY:    Penis:  normal, noncircumcised  Scrotum:  normal  Testes:  normal, no masses, size 20cc  Epididymides:  normal  Anus/Perineum:  within normal limits  Sphincter tone:  within normal limits  Rectum:  negative without mass, lesions or tenderness    Prostate:  30 gm prostate r>l wo nodules or induration  Seminal vesicles:  nonpalpable  LYMPHATIC:  Cervical, supraclavicular, and axillary nodes normal.    GU DATA    Results for orders placed in visit on 12/06/10   AMB POC PVR, MEAS,POST-VOID RES,US,NON-IMAGING       Component Value Range    PVR 102     AMB POC URINALYSIS DIP STICK AUTO W/ MICRO (MICRO RESULTS)       Component Value Range    Color Yellow  (none)    Clarity Clear  (none)    Glucose Negative  (none)    Bilirubin Negative  (none)    Ketones Negative  (none)    Spec.Grav. 1.010  1.001 - 1.035    Blood Negative  (none)    pH 6.5  4.6 - 8.0    Protein n  Negative mg/dL    Urobilinogen 0.2 mg/dL      Nitrites Negative  (none)    Leukocyte esterase Negative  (  none)    Epithelial cells        WBCs n      RBCs n      Bacteria n      Crystals        Other           AUA Assessment Score:  AUA Score: 7 ;    AUA Bother Rating: Pleased   His  AUASS symptoms are detailed in the document flow sheet

## 2011-06-06 NOTE — Progress Notes (Signed)
psa drawn today w/o complications

## 2011-06-06 NOTE — Patient Instructions (Signed)
MyChart Activation    Thank you for requesting access to MyChart. Please follow the instructions below to securely access and download your online medical record. MyChart allows you to send messages to your doctor, view your test results, renew your prescriptions, schedule appointments, and more.    How Do I Sign Up?    1. In your internet browser, go to [email or web address]  2. Click on the First Time User? Click Here link in the Sign In box. You will be redirect to the New Member Sign Up page.  3. Enter your MyChart Access Code exactly as it appears below. You will not need to use this code after you???ve completed the sign-up process. If you do not sign up before the expiration date, you must request a new code.    MyChart Access Code: Not generated  Current MyChart Status: Active (This is the date your MyChart access code will expire)    4. Enter the last four digits of your Social Security Number (xxxx) and Date of Birth (mm/dd/yyyy) as indicated and click Submit. You will be taken to the next sign-up page.  5. Create a MyChart ID. This will be your MyChart login ID and cannot be changed, so think of one that is secure and easy to remember.  6. Create a MyChart password. You can change your password at any time.  7. Enter your Password Reset Question and Answer. This can be used at a later time if you forget your password.   8. Enter your e-mail address. You will receive e-mail notification when new information is available in MyChart.  9. Click Sign Up. You can now view and download portions of your medical record.  10. Click the Download Summary menu link to download a portable copy of your medical information.    Additional Information    If you have questions, please visit the Frequently Asked Questions section of the MyChart website at [email or web address] Remember, MyChart is NOT to be used for urgent needs. For medical emergencies, dial 911.

## 2011-06-07 LAB — PSA, DIAGNOSTIC (PROSTATE SPECIFIC AG): Prostate Specific Ag: 2.2 ng/mL (ref 0.0–4.0)

## 2011-06-09 NOTE — Progress Notes (Signed)
LVM for pt to call office for lab results/[id number]

## 2011-06-09 NOTE — Progress Notes (Signed)
Elevated PSA   020909 psa 1.9; 081009 psa 2.3; 020810 psa 1.6; 213086 psa 2.2; 030111 psa 3.1; 578469 psa 2.9; 629528 psa 2.23; 413244 psa 2.5; 010272 psa 2.2;     10/04/2009 chart entry - pt has fu appt with me in September having been referred by Dr Jose Stafford per his retirement; 78 yo with normal psa but abnormal psa velocity; has bph;   11/19/2009 office encounter - Preexam psa pending, pt to call if hasn't heard psa report by two weeks; exam shows 30 gm prostate, l>r wo nodules; reassured pt no sign of cancer; copy of psa to pcp planned;   Psa 2.9, reassuring letter to patient, copy of report to Dr Jose Stafford   05/20/2010 medical assistant encounter - psa draw 2.23;  appt near future  05/27/2010 office encounter  - elev psa surveillance in pt with family hx of sig pca; psa range is 1.9 - 3.1, present 2.23; pt anxious about his psa and family hx wants six month checks; urine is micro benign; exam shows 30 gm prostate r>l wo nodules or induration; reassured pt no sign of prostate cancer but at risk due to age and family hx; reassured him pca death unlikely for him;   12-26-10 medical assistant encounter - psa draw, reassuring stable value of 2.5; appt near future  12/06/2010 office encounter  - elevated psa hx in pt with strong fam hx of pca - brothers and his son and 3 nephews all with pca; reviewed his psa hx which is reassuring with stable value of 2.5; pt wo sxs; feels great - even though 31 he wishes active surveillance; exam shows 30 gm prostate r>l wo nodules or induration; will continue six month surveillance  06/06/2011 medical assistant encounter - psa 2.2, cw, let pt know his psa improved normal at 2.2; arrange appt with me in six months for " ua surveillance bph luts fam hx pca " - i recommend no psa testing; if he insists on psa testing, then get psa level two week prior;         BPH w LUTS   Early 2000's microwave treatment - successful   [id number] Jose Stafford) notes This is a follow-up visit for Jose Stafford who  has an established diagnosis of BPH with obstruction. There has been improvement of his symptoms. He reports nocturia one time a night. His current medications includes no medications. He has not experienced Problems when on these medications. Since his last visit he notes less frequency less nocturia less urgency   [id number] Jose Stafford) notes There has been improvement of his symptoms. He reports nocturia is rare now. He is bothered by some daytime urgency, which has become more noticeable over the past 6 mos. His current medications includes no medications. Since his last visit he notes less frequency less nocturia But more daytime urgency; PLAN Sanctura trial   There has been further improvement of his symptoms. He reports nocturia is rare now, and not more than once nightly. He was bothered by some daytime urgency, but began Sanctura with good improvement.   [id number] Jose Stafford) notes His current medications includes no BPH medications. He continues on Reunion, with good results.   [id number] Jose Stafford) notes There has been improvement of his symptoms. He reports nocturia one or two times a night. He is emptying well, with a PVR of 310 cc today. He is reasonably happy with his voiding despite this. His current medications includes Sanctura 60 mgm qHS. He has not experienced side effects on this  medication.   [id number] Jose Stafford) notes irritable sxs controlled with sanctura, notes 40 gm benign prostate   10/04/2009 chart entry - chart reviewed, note pt has rx for sudafed and is on sanctura with 40 gm prostate - will discuss impact of this rx on bph sxs and increased pvr;   11/19/2009 office encounter - bph surveillance, reviewed risk of sudafed with bph - uses rarely; remains on sanctura xr on empty stomach, pleased with bladder control; discussed trial of cessation if he would like since doing; denies hematuria or dysuria; urine is dip and micro negative, exam shows 30 gm benign prostate l>r wo Nodules; ok to trial cesation of sanctura if  he'd like   05/01/2010 chart entry ??? changed insurance needs alternative for sanctura since not on his insurance approved list, meds listed include oxybutynin, oxybutynin er 5, 10, 15, enablex, gelnique, vesicare; will go with vesicare 10 mg #30 rfx11, sent to [facility] pharmacy  05/06/2010 chart entry ??? vesicare too expensive, pt wants generic, pt advised of short term memory loss associated with oxybutynin; Rx oxbutynin er 10 mg daily #30 rfx11  05/27/2010 office encounter  - pt opted to stay w vesicare and avoid side effects with the oxybutynin; his AUA symptom score (detailed in the documents flowsheet) is only 2 on vesicare; pt pleased; will titrate dose to every other and then every third; whatever minimum dosage is effective;   12/06/2010 office encounter  - weaned off vesicare, his AUA symptom score (detailed in the documents flowsheet) is 7; denies hematuria or dysuria; ua dip and micro negative;       IBE   POST VOID RESIDUAL  10/22/2007  04/21/2008  10/20/2008    PVR  72ml  58cc  310   11/19/2009 office encounter - auass 4, pvr 45 cc, pt wo bother   05/27/2010 office encounter  - auass 2, under influence of vesicare  12/06/2010 office encounter  - auass 7, has stopped vesicare, pvr 10cc      Family Hx of Prostate Cancer   11/19/2009 office encounter - reports 3 brothers with pca  12/06/2010 office encounter  - now his 37 yo son has been diagnosed with pca;

## 2011-06-14 NOTE — Progress Notes (Signed)
PSA results to pt and appt scheduled/[id number]

## 2011-06-14 NOTE — Progress Notes (Signed)
LVM for pt to contact office for lab results and appt/[id number]

## 2011-12-08 LAB — AMB POC URINALYSIS DIP STICK AUTO W/ MICRO (MICRO RESULTS)
Bacteria (UA POC): 0
Bilirubin (UA POC): NEGATIVE
Blood (UA POC): NEGATIVE
Glucose (UA POC): NEGATIVE
Ketones (UA POC): NEGATIVE
Leukocyte esterase (UA POC): NEGATIVE
Nitrites (UA POC): NEGATIVE
Protein (UA POC): NEGATIVE mg/dL
RBCs (UA POC): 0
Specific gravity (UA POC): 1.015 (ref 1.001–1.035)
Urobilinogen (UA POC): 0.2 (ref 0.2–1)
WBCs (UA POC): 0
pH (UA POC): 6 (ref 4.6–8.0)

## 2011-12-08 NOTE — Addendum Note (Signed)
Addended by: Clarene Critchley A on: 12/08/2011 02:51 PM     Modules accepted: Orders

## 2011-12-08 NOTE — Progress Notes (Signed)
Blood drawn by venipuncture for psa

## 2011-12-08 NOTE — Progress Notes (Addendum)
Jose Stafford  DOB [birth year]    [name], MD  [facility], [facility]  [address], [address]  [place], [place] [address]  [phone]      ASSESSMENT/PLAN    Encounter Diagnoses   Name Primary?   ??? BPH w urinary obs/LUTS Yes   ??? Urgency of urination    ??? Family hx of prostate cancer        Assessment:  BPH wo bother, hx of elevated psa, strong fam hx of pca; stable exam  Plan:  Pt requests psa in six months, exam in a year      HISTORY OF PRESENT ILLNESS  He is currently 78 y.o. and his primary care provider is [name], MD.    Chief Complaint   Patient presents with   ??? Prostate Cancer     Elevated PSA   020909 psa 1.9; 081009 psa 2.3; 020810 psa 1.6; 914782 psa 2.2; 030111 psa 3.1; 956213 psa 2.9; 086578 psa 2.23; 469629 psa 2.5; 528413 psa 2.2; 244010 psa 2.2;     10/04/2009 chart entry - pt has fu appt with me in September having been referred by Dr Burnadette Peter per his retirement; 78 yo with normal psa but abnormal psa velocity; has bph;   11/19/2009 office encounter - Preexam psa pending, pt to call if hasn't heard psa report by two weeks; exam shows 30 gm prostate, l>r wo nodules; reassured pt no sign of cancer; copy of psa to pcp planned;   Psa 2.9, reassuring letter to patient, copy of report to Dr Donette Larry   05/20/2010 medical assistant encounter - psa draw 2.23;  appt near future  05/27/2010 office encounter  - elev psa surveillance in pt with family hx of sig pca; psa range is 1.9 - 3.1, present 2.23; pt anxious about his psa and family hx wants six month checks; urine is micro benign; exam shows 30 gm prostate r>l wo nodules or induration; reassured pt no sign of prostate cancer but at risk due to age and family hx; reassured him pca death unlikely for him;   2010-12-07 medical assistant encounter - psa draw, reassuring stable value of 2.5; appt near future  12/06/2010 office encounter  - elevated psa hx in pt with strong fam hx of pca - brothers and his son and 3 nephews all with  pca; reviewed his psa hx which is reassuring with stable value of 2.5; pt wo sxs; feels great - even though 76 he wishes active surveillance; exam shows 30 gm prostate r>l wo nodules or induration; will continue six month surveillance  06/06/2011 medical assistant encounter - psa 2.2, cw, let pt know his psa improved normal at 2.2; arrange appt with me in six months for " ua surveillance bph luts fam hx pca " - i recommend no psa testing; if he insists on psa testing, then get psa level two week prior;   12/08/2011 office encounter  - extensive fam hx of pca; here for fu exam, psa's have been normal recently so pt deferred psa testing; exam shows 30 gm prostate R>L  wo nodules; continue six months surveillance  psa was drawn for some reason, psa 2.2; no more psa's, letter to patient      BPH w LUTS   Early 2000's microwave treatment - successful   [id number] Burnadette Peter) notes This is a follow-up visit for [name] who has an established diagnosis of BPH with obstruction. There has been improvement of his symptoms.  He reports nocturia one time a night. His current medications includes no medications. He has not experienced Problems when on these medications. Since his last visit he notes less frequency less nocturia less urgency   [id number] Burnadette Peter) notes There has been improvement of his symptoms. He reports nocturia is rare now. He is bothered by some daytime urgency, which has become more noticeable over the past 6 mos. His current medications includes no medications. Since his last visit he notes less frequency less nocturia But more daytime urgency; PLAN Sanctura trial   There has been further improvement of his symptoms. He reports nocturia is rare now, and not more than once nightly. He was bothered by some daytime urgency, but began Sanctura with good improvement.   [id number] Burnadette Peter) notes His current medications includes no BPH medications. He continues on Reunion, with good results.   [id number] Burnadette Peter) notes There has  been improvement of his symptoms. He reports nocturia one or two times a night. He is emptying well, with a PVR of 310 cc today. He is reasonably happy with his voiding despite this. His current medications includes Sanctura 60 mgm qHS. He has not experienced side effects on this medication.   [id number] [name]) notes irritable sxs controlled with sanctura, notes 40 gm benign prostate   10/04/2009 chart entry - chart reviewed, note pt has rx for sudafed and is on sanctura with 40 gm prostate - will discuss impact of this rx on bph sxs and increased pvr;   11/19/2009 office encounter - bph surveillance, reviewed risk of sudafed with bph - uses rarely; remains on sanctura xr on empty stomach, pleased with bladder control; discussed trial of cessation if he would like since doing; denies hematuria or dysuria; urine is dip and micro negative, exam shows 30 gm benign prostate l>r wo Nodules; ok to trial cesation of sanctura if he'd like   05/01/2010 chart entry ??? changed insurance needs alternative for sanctura since not on his insurance approved list, meds listed include oxybutynin, oxybutynin er 5, 10, 15, enablex, gelnique, vesicare; will go with vesicare 10 mg #30 rfx11, sent to [facility] pharmacy  05/06/2010 chart entry ??? vesicare too expensive, pt wants generic, pt advised of short term memory loss associated with oxybutynin; Rx oxbutynin er 10 mg daily #30 rfx11  05/27/2010 office encounter  - pt opted to stay w vesicare and avoid side effects with the oxybutynin; his AUA symptom score (detailed in the documents flowsheet) is only 2 on vesicare; pt pleased; will titrate dose to every other and then every third; whatever minimum dosage is effective;   12/06/2010 office encounter  - weaned off vesicare, his AUA symptom score (detailed in the documents flowsheet) is 7; denies hematuria or dysuria; ua dip and micro negative;   12/08/2011 office encounter  - bph surveillance, no meds; pt satisfied with voiding; his AUA symptom score  (detailed in the documents flowsheet) is 4, denies hematuria or dysuria; ua dip and micro negative; occl urgency      IBE   POST VOID RESIDUAL  10/22/2007  04/21/2008  10/20/2008    PVR  72ml  58cc  310   11/19/2009 office encounter - auass 4, pvr 45 cc, pt wo bother   05/27/2010 office encounter  - auass 2, under influence of vesicare  12/06/2010 office encounter  - auass 7, has stopped vesicare, pvr 10cc  12/08/2011 office encounter  - auass 4 wo bother, no meds      Family Hx of Prostate Cancer  11/19/2009 office encounter - reports 3 brothers with pca  12/06/2010 office encounter  - now his 63 yo son has been diagnosed with pca;     PAST MEDICAL HISTORY    Current Outpatient Prescriptions   Medication Sig Dispense Refill   ??? acetaminophen (TYLENOL) 325 mg tablet Take  by mouth every four (4) hours as needed.       ??? sertraline (ZOLOFT) 25 mg tablet Take  by mouth daily.       ??? CEPHALEXIN (KEFLEX PO) Take  by mouth.       ??? omeprazole (PRILOSEC OTC) 20 mg tablet Take 20 mg by mouth daily.         ??? azelastine (ASTELIN) 137 mcg nasal spray 1 Spray two (2) times a day. Use in each nostril as directed        ??? simvastatin (ZOCOR) 10 mg tablet Take  by mouth nightly.         ??? montelukast (SINGULAIR) 10 mg tablet Take 10 mg by mouth daily.         ??? Cetirizine (ZYRTEC) 10 mg Cap Take  by mouth.         ??? aspirin delayed-release 81 mg tablet Take  by mouth daily.         ??? GUAIFENESIN (MUCINEX PO) Take  by mouth.           Allergies   Allergen Reactions   ??? Sulfa (Sulfonamide Antibiotics) Unknown (comments)       REVIEW OF SYSTEMS  Complete systems review with patient who reports no change TODAY [phone] since last complete ROS dated 030812 and scanned to chart  Constitutional: Neg for fever, neg for chills, neg for weight loss, neg for change in appetite, neg for changes in energy level  Eyes: Neg for visual disturbance, neg for pain, neg for discharge  ENT: Neg for difficulty speaking, neg for pain with swallowing, neg for  hearing difficulty  Respiratory: Neg for Shortness of breath, chronic cough with sputum, neg for hemoptysis  Cardiac: Neg for chest pain, neg for rapid heartbeat, occl irregular heartbeat  GU: Neg for history of kidney stones, neg for frequent UTI, neg for flank pain  GI: Neg for constipation, neg for diarrhea, neg for melena, neg for hematochezia  MSK: Neg for bone pain, some aging muscular weakness, neg for muscular tenderness  Skin: Neg for skin conditions, neg for skin rashes  Neurologic: Neg for focal weakness, neg for numbness, neg for seizures  Psychiatric: Neg for history of psychiatric illness  Endocrine: Neg for diabetes, neg for thyroid disease, neg for excessive urination, neg for excessive thirst, neg for heat intolerance  Lymph: Neg for frequent infections, has easy bruising, neg for  lymph node enlargement    PHYSICAL EXAM  BP 102/62   Ht 5\' 5"  (1.651 m)   Wt 163 lb (73.936 kg)   BMI 27.12 kg/m2  Discussed the patient's BMI with him.  The BMI follow up plan is as follows: BMI OK.    GENERAL APPEARANCE:  78 y.o. Caucasian  male alert, cooperative, no distress, appears stated age not obese;  ABDOMEN:  flat soft, non-tender. Bowel sounds normal. No masses,  no organomegaly  BACK:  symmetric, no curvature. ROM normal. No CVA tenderness.  GENITOURINARY:    Penis:  normal, noncircumcised  Scrotum:  normal  Testes:  normal, no masses, size 20cc  Epididymides:  normal  Anus/Perineum:  within normal limits  Sphincter tone:  within normal  limits  Rectum:  negative without mass, lesions or tenderness    Prostate:  30 gm prostate r>l wo nodules or induration  Seminal vesicles:  nonpalpable  LYMPHATIC:  Cervical, supraclavicular, and axillary nodes normal.    GU DATA    Results for orders placed in visit on 12/08/11   AMB POC URINALYSIS DIP STICK AUTO W/ MICRO (MICRO RESULTS)       Component Value Range    Color (UA POC) Yellow  (none)    Clarity (UA POC) Clear  (none)    Glucose (UA POC) Negative  (none)     Bilirubin (UA POC) Negative  (none)    Ketones (UA POC) Negative  (none)    Specific gravity (UA POC) 1.015  1.001 - 1.035    Blood (UA POC) Negative  (none)    pH (UA POC) 6.0  4.6 - 8.0    Protein (UA POC) Negative  Negative mg/dL    Urobilinogen (UA POC) 0.2 mg/dL  0.2 - 1    Nitrites (UA POC) Negative  (none)    Leukocyte esterase (UA POC) Negative  (none)    Epithelial cells (UA POC)        WBCs (UA POC) 0      RBCs (UA POC) 0      Bacteria (UA POC) 0      Crystals (UA POC)        Other (UA POC)           AUA Assessment Score:  AUA Score: 4 ;    AUA Bother Rating: Pleased   His  AUASS symptoms are detailed in the document flow sheet

## 2011-12-09 LAB — PSA, DIAGNOSTIC (PROSTATE SPECIFIC AG): Prostate Specific Ag: 2.2 ng/mL (ref 0.0–4.0)

## 2012-06-06 NOTE — Progress Notes (Signed)
After verifying pt's name and date of birth   psa drawn today  and sent to [facility] w/o incident   pt tolerated procedure well. (600.01)

## 2012-06-07 LAB — PSA, DIAGNOSTIC (PROSTATE SPECIFIC AG): Prostate Specific Ag: 2.4 ng/mL (ref 0.0–4.0)

## 2012-06-07 NOTE — Progress Notes (Signed)
Elevated PSA   020909 psa 1.9; 081009 psa 2.3; 020810 psa 1.6; 147829 psa 2.2; 030111 psa 3.1; 562130 psa 2.9; 865784 psa 2.23; 696295 psa 2.5; 284132 psa 2.2; 440102 psa 2.2;  725366 psa 2.4;   10/04/2009 chart entry - pt has fu appt with me in September having been referred by Dr Burnadette Peter per his retirement; 79 yo with normal psa but abnormal psa velocity; has bph;   11/19/2009 office encounter - Preexam psa pending, pt to call if hasn't heard psa report by two weeks; exam shows 30 gm prostate, l>r wo nodules; reassured pt no sign of cancer; copy of psa to pcp planned;   Psa 2.9, reassuring letter to patient, copy of report to Dr Donette Larry   05/20/2010 medical assistant encounter - psa draw 2.23;  appt near future  05/27/2010 office encounter  - elev psa surveillance in pt with family hx of sig pca; psa range is 1.9 - 3.1, present 2.23; pt anxious about his psa and family hx wants six month checks; urine is micro benign; exam shows 30 gm prostate r>l wo nodules or induration; reassured pt no sign of prostate cancer but at risk due to age and family hx; reassured him pca death unlikely for him;   12/09/2010 medical assistant encounter - psa draw, reassuring stable value of 2.5; appt near future  12/06/2010 office encounter  - elevated psa hx in pt with strong fam hx of pca - brothers and his son and 3 nephews all with pca; reviewed his psa hx which is reassuring with stable value of 2.5; pt wo sxs; feels great - even though 16 he wishes active surveillance; exam shows 30 gm prostate r>l wo nodules or induration; will continue six month surveillance  06/06/2011 medical assistant encounter - psa 2.2, cw, let pt know his psa improved normal at 2.2; arrange appt with me in six months for " ua surveillance bph luts fam hx pca " - i recommend no psa testing; if he insists on psa testing, then get psa level two week prior;   12/08/2011 office encounter  - extensive fam hx of pca; here for fu exam, psa's have been normal recently  so pt deferred psa testing; exam shows 30 gm prostate R>L  wo nodules; continue six months surveillance  psa was drawn for some reason, psa 2.2; no more psa's, letter to patient      BPH w LUTS   Early 2000's microwave treatment - successful   [id number] Burnadette Peter) notes This is a follow-up visit for Jose Stafford who has an established diagnosis of BPH with obstruction. There has been improvement of his symptoms. He reports nocturia one time a night. His current medications includes no medications. He has not experienced Problems when on these medications. Since his last visit he notes less frequency less nocturia less urgency   [id number] Burnadette Peter) notes There has been improvement of his symptoms. He reports nocturia is rare now. He is bothered by some daytime urgency, which has become more noticeable over the past 6 mos. His current medications includes no medications. Since his last visit he notes less frequency less nocturia But more daytime urgency; PLAN Sanctura trial   There has been further improvement of his symptoms. He reports nocturia is rare now, and not more than once nightly. He was bothered by some daytime urgency, but began Sanctura with good improvement.   [id number] Burnadette Peter) notes His current medications includes no BPH medications. He continues on Reunion, with good results.  [id number] Burnadette Peter) notes There has been improvement of his symptoms. He reports nocturia one or two times a night. He is emptying well, with a PVR of 310 cc today. He is reasonably happy with his voiding despite this. His current medications includes Sanctura 60 mgm qHS. He has not experienced side effects on this medication.   [id number] [name]) notes irritable sxs controlled with sanctura, notes 40 gm benign prostate   10/04/2009 chart entry - chart reviewed, note pt has rx for sudafed and is on sanctura with 40 gm prostate - will discuss impact of this rx on bph sxs and increased pvr;   11/19/2009 office encounter - bph surveillance, reviewed  risk of sudafed with bph - uses rarely; remains on sanctura xr on empty stomach, pleased with bladder control; discussed trial of cessation if he would like since doing; denies hematuria or dysuria; urine is dip and micro negative, exam shows 30 gm benign prostate l>r wo Nodules; ok to trial cesation of sanctura if he'd like   05/01/2010 chart entry ??? changed insurance needs alternative for sanctura since not on his insurance approved list, meds listed include oxybutynin, oxybutynin er 5, 10, 15, enablex, gelnique, vesicare; will go with vesicare 10 mg #30 rfx11, sent to [facility] pharmacy  05/06/2010 chart entry ??? vesicare too expensive, pt wants generic, pt advised of short term memory loss associated with oxybutynin; Rx oxbutynin er 10 mg daily #30 rfx11  05/27/2010 office encounter  - pt opted to stay w vesicare and avoid side effects with the oxybutynin; his AUA symptom score (detailed in the documents flowsheet) is only 2 on vesicare; pt pleased; will titrate dose to every other and then every third; whatever minimum dosage is effective;   12/06/2010 office encounter  - weaned off vesicare, his AUA symptom score (detailed in the documents flowsheet) is 7; denies hematuria or dysuria; ua dip and micro negative;   12/08/2011 office encounter  - bph surveillance, no meds; pt satisfied with voiding; his AUA symptom score (detailed in the documents flowsheet) is 4, denies hematuria or dysuria; ua dip and micro negative; occl urgency      IBE   POST VOID RESIDUAL  10/22/2007  04/21/2008  10/20/2008    PVR  72ml  58cc  310   11/19/2009 office encounter - auass 4, pvr 45 cc, pt wo bother   05/27/2010 office encounter  - auass 2, under influence of vesicare  12/06/2010 office encounter  - auass 7, has stopped vesicare, pvr 10cc  12/08/2011 office encounter  - auass 4 wo bother, no meds      Family Hx of Prostate Cancer   11/19/2009 office encounter - reports 3 brothers with pca  12/06/2010 office encounter  - now his 29 yo son has been  diagnosed with pca;

## 2012-06-18 LAB — AMB POC URINALYSIS DIP STICK AUTO W/ MICRO (MICRO RESULTS)
Bacteria (UA POC): NEGATIVE
Bilirubin (UA POC): NEGATIVE
Blood (UA POC): NEGATIVE
Glucose (UA POC): NEGATIVE
Ketones (UA POC): NEGATIVE
Leukocyte esterase (UA POC): NEGATIVE
Nitrites (UA POC): NEGATIVE
Protein (UA POC): NEGATIVE mg/dL
RBCs (UA POC): NEGATIVE
Specific gravity (UA POC): 1.015 (ref 1.001–1.035)
Urobilinogen (UA POC): 0.2 (ref 0.2–1)
WBCs (UA POC): NEGATIVE
pH (UA POC): 7 (ref 4.6–8.0)

## 2012-06-18 LAB — AMB POC PVR, MEAS,POST-VOID RES,US,NON-IMAGING: PVR: 163 cc

## 2012-06-18 NOTE — Progress Notes (Signed)
Jose Stafford  DOB [birth year]    Jose Heys, MD  [facility] [place]  [address]  [place], [place] [address]  [phone]      ASSESSMENT/PLAN               Encounter Diagnoses   Name Primary?   ??? BPH w urinary obs/LUTS Yes   ??? Urgency of urination    ??? Family hx of prostate cancer    ??? Incomplete bladder emptying        Assessment:  Weakening of urinary stream, will check pvr before he leaves; sig fear of pca with strong fam hx of pca death - insists on continued six month surveillance  Plan:  pvr check today; fu in six month with psa prior; (pvr after seeing patient was around 269, had pt double void and pvr 163 cc - will recheck pvr in a month)       HISTORY OF PRESENT ILLNESS  He is currently 79 y.o. and his primary care provider is Jose Cage, MD.    Chief Complaint   Patient presents with   ??? Prostate Cancer       Elevated PSA   020909 psa 1.9; 081009 psa 2.3; 020810 psa 1.6; 914782 psa 2.2; 956213 psa 3.1; 086578 psa 2.9; 469629 psa 2.23; 528413 psa 2.5; 244010 psa 2.2; 272536 psa 2.2;  644034 psa 2.4;   10/04/2009 chart entry - pt has fu appt with me in September having been referred by Dr Burnadette Peter per his retirement; 79 yo with normal psa but abnormal psa velocity; has bph;   11/19/2009 office encounter - Preexam psa pending, pt to call if hasn't heard psa report by two weeks; exam shows 30 gm prostate, l>r wo nodules; reassured pt no sign of cancer; copy of psa to pcp planned;   Psa 2.9, reassuring letter to patient, copy of report to Dr Donette Larry   05/20/2010 medical assistant encounter - psa draw 2.23;  appt near future  05/27/2010 office encounter  - elev psa surveillance in pt with family hx of sig pca; psa range is 1.9 - 3.1, present 2.23; pt anxious about his psa and family hx wants six month checks; urine is micro benign; exam shows 30 gm prostate r>l wo nodules or induration; reassured pt no sign of prostate cancer but at risk due to age and family hx; reassured  him pca death unlikely for him;   12/20/2010 medical assistant encounter - psa draw, reassuring stable value of 2.5; appt near future  12/06/2010 office encounter  - elevated psa hx in pt with strong fam hx of pca - brothers and his son and 3 nephews all with pca; reviewed his psa hx which is reassuring with stable value of 2.5; pt wo sxs; feels great - even though 39 he wishes active surveillance; exam shows 30 gm prostate r>l wo nodules or induration; will continue six month surveillance  06/06/2011 medical assistant encounter - psa 2.2, cw, let pt know his psa improved normal at 2.2; arrange appt with me in six months for " ua surveillance bph luts fam hx pca " - i recommend no psa testing; if he insists on psa testing, then get psa level two week prior;   12/08/2011 office encounter  - extensive fam hx of pca; here for fu exam, psa's have been normal recently so pt deferred psa testing; exam shows 30 gm prostate R>L  wo nodules; continue six months surveillance  psa was drawn for some reason, psa 2.2; no more psa's, letter to patient  06/18/2012 office encounter  - pt insists on continued psa checks in addition to prostate exam due to strong family hx of pca death; exam shows 30 gm prostate l>r wo nodules or induration; psa currently is 2.4, reviewed psa hx with im, recheck in six months;       BPH w LUTS   Early 2000's microwave treatment - successful   [id number] Burnadette Peter) notes This is a follow-up visit for Jose Stafford who has an established diagnosis of BPH with obstruction. There has been improvement of his symptoms. He reports nocturia one time a night. His current medications includes no medications. He has not experienced Problems when on these medications. Since his last visit he notes less frequency less nocturia less urgency   [id number] Burnadette Peter) notes There has been improvement of his symptoms. He reports nocturia is rare now. He is bothered by some daytime urgency, which has become more noticeable over the past 6  mos. His current medications includes no medications. Since his last visit he notes less frequency less nocturia But more daytime urgency; PLAN Sanctura trial   There has been further improvement of his symptoms. He reports nocturia is rare now, and not more than once nightly. He was bothered by some daytime urgency, but began Sanctura with good improvement.   [id number] Burnadette Peter) notes His current medications includes no BPH medications. He continues on Reunion, with good results.   [id number] Burnadette Peter) notes There has been improvement of his symptoms. He reports nocturia one or two times a night. He is emptying well, with a PVR of 310 cc today. He is reasonably happy with his voiding despite this. His current medications includes Sanctura 60 mgm qHS. He has not experienced side effects on this medication.   [id number] [name]) notes irritable sxs controlled with sanctura, notes 40 gm benign prostate   10/04/2009 chart entry - chart reviewed, note pt has rx for sudafed and is on sanctura with 40 gm prostate - will discuss impact of this rx on bph sxs and increased pvr;   11/19/2009 office encounter - bph surveillance, reviewed risk of sudafed with bph - uses rarely; remains on sanctura xr on empty stomach, pleased with bladder control; discussed trial of cessation if he would like since doing; denies hematuria or dysuria; urine is dip and micro negative, exam shows 30 gm benign prostate l>r wo Nodules; ok to trial cesation of sanctura if he'd like   05/01/2010 chart entry ??? changed insurance needs alternative for sanctura since not on his insurance approved list, meds listed include oxybutynin, oxybutynin er 5, 10, 15, enablex, gelnique, vesicare; will go with vesicare 10 mg #30 rfx11, sent to [facility] pharmacy  05/06/2010 chart entry ??? vesicare too expensive, pt wants generic, pt advised of short term memory loss associated with oxybutynin; Rx oxbutynin er 10 mg daily #30 rfx11  05/27/2010 office encounter  - pt opted to stay w vesicare  and avoid side effects with the oxybutynin; his AUA symptom score (detailed in the documents flowsheet) is only 2 on vesicare; pt pleased; will titrate dose to every other and then every third; whatever minimum dosage is effective;   12/06/2010 office encounter  - weaned off vesicare, his AUA symptom score (detailed in the documents flowsheet) is 7; denies hematuria or dysuria; ua dip and micro negative;   12/08/2011 office encounter  - bph surveillance, no meds; pt satisfied with voiding; his AUA symptom  score (detailed in the documents flowsheet) is 4, denies hematuria or dysuria; ua dip and micro negative; occl urgency  06/18/2012 office encounter  - not on any meds right now, stream a little slower; his AUA symptom score (detailed in the documents flowsheet) is 9; denies hematuria or dysuria; ua dip and micro negative; exam shows 30 gm benign prostate wo nodules or induration; will check pvr; pt defers any meds for now; if beomes bothered will call and we will trial flomax;       IBE   POST VOID RESIDUAL  10/22/2007  04/21/2008  10/20/2008    PVR  72ml  58cc  310   11/19/2009 office encounter - auass 4, pvr 45 cc, pt wo bother   05/27/2010 office encounter  - auass 2, under influence of vesicare  12/06/2010 office encounter  - auass 7, has stopped vesicare, pvr 10cc  12/08/2011 office encounter  - auass 4 wo bother, no meds  06/18/2012 office encounter  - auass 9, stream a little slower; pvr 163 cc (initially 269, had pt double void and pvr 163 cc) will recheck pvr in a month;       Family Hx of Prostate Cancer   11/19/2009 office encounter - reports 3 brothers with pca  12/06/2010 office encounter  - now his 30 yo son has been diagnosed with pca;     PAST MEDICAL HISTORY    Past Surgical History   Procedure Laterality Date   ??? Hx orthopaedic     ??? Pr cardiac surg procedure unlist         Past Medical History   Diagnosis Date   ??? Elevated PSA    ??? Family hx of prostate cancer    ??? Urgency of urination    ??? Benign localized  hyperplasia of prostate with urinary obstruction and other lower urinary tract symptoms (LUTS)(600.21)      Current Outpatient Prescriptions   Medication Sig Dispense Refill   ??? lansoprazole (PREVACID) 15 mg capsule Take  by mouth Daily (before breakfast).       ??? ipratropium (ATROVENT) 0.03 % nasal spray 2 Sprays every twelve (12) hours.       ??? cyanocobalamin (VITAMIN B-12) 500 mcg tablet Take 500 mcg by mouth daily.       ??? Ferrous Fumarate 325 mg (106 mg iron) tab Take  by mouth.       ??? ASCORBATE CALCIUM (VITAMIN C PO) Take  by mouth.       ??? acetaminophen (TYLENOL) 325 mg tablet Take  by mouth every four (4) hours as needed.       ??? sertraline (ZOLOFT) 25 mg tablet Take  by mouth daily.       ??? simvastatin (ZOCOR) 10 mg tablet Take  by mouth nightly.         ??? montelukast (SINGULAIR) 10 mg tablet Take 10 mg by mouth daily.         ??? Cetirizine (ZYRTEC) 10 mg Cap Take  by mouth.         ??? aspirin delayed-release 81 mg tablet Take  by mouth daily.         ??? GUAIFENESIN (MUCINEX PO) Take  by mouth.           Allergies   Allergen Reactions   ??? Milk Contact Dermatitis   ??? Sulfa (Sulfonamide Antibiotics) Unknown (comments)     Family History   Problem Relation Age of Onset   ??? Cancer     ???  Hypertension         Social  History   Smoking status   ??? Never Smoker    Smokeless tobacco   ??? Never Used     History   Alcohol Use   ??? 0.5 oz/week   ??? 1 Glasses of wine per week        REVIEW OF SYSTEMS    Constitutional: Fever: No  Skin: Rash:   HEENT: Hearing difficulty: No  Eyes: Blurred vision: No  Cardiovascular: Chest pain: No  Respiratory: Shortness of breath: No  Gastrointestinal: Nausea/vomiting: No  Musculoskeletal: Back pain: No  Neurological: Weakness: Yes  Psychological: Memory loss: Yes  Comments/additional findings:       PHYSICAL EXAM  BP 102/62   Ht 5\' 7"  (1.702 m)   Wt 160 lb (72.576 kg)   BMI 25.05 kg/m2    The BMI follow up plan is as follows: bmi ok    GENERAL APPEARANCE:  79 y.o. Caucasian  male alert,  cooperative, no distress, appears stated age not obese;  ABDOMEN:  flat soft, non-tender. Bowel sounds normal. No masses,  no organomegaly  BACK:  symmetric, no curvature. ROM normal. No CVA tenderness.  GENITOURINARY:    Penis:  normal, noncircumcised  Scrotum:  normal  Testes:  normal, no masses, size 20cc  Epididymides:  normal  Anus/Perineum:  within normal limits  Sphincter tone:  within normal limits  Rectum:  negative without mass, lesions or tenderness    Prostate:  30 gm prostate l>r wo nodules or induration  Seminal vesicles:  nonpalpable  LYMPHATIC:  Cervical, supraclavicular, and axillary nodes normal.      GU DATA    Results for orders placed in visit on 06/18/12   AMB POC URINALYSIS DIP STICK AUTO W/ MICRO (MICRO RESULTS)       Result Value Range    Color (UA POC) Yellow      Clarity (UA POC) Clear      Glucose (UA POC) Negative  Negative    Bilirubin (UA POC) Negative  Negative    Ketones (UA POC) Negative  Negative    Specific gravity (UA POC) 1.015  1.001 - 1.035    Blood (UA POC) Negative  Negative    pH (UA POC) 7.0  4.6 - 8.0    Protein (UA POC) Negative  Negative mg/dL    Urobilinogen (UA POC) 0.2 mg/dL  0.2 - 1    Nitrites (UA POC) Negative  Negative    Leukocyte esterase (UA POC) Negative  Negative    Epithelial cells (UA POC)        WBCs (UA POC) n      RBCs (UA POC) n      Bacteria (UA POC) n      Crystals (UA POC)    Negative    Other (UA POC)       AMB POC PVR, MEAS,POST-VOID RES,US,NON-IMAGING       Result Value Range    PVR 163         AUA Symptom Score 06/18/2012   Over the past month how often have you had the sensation that your baldder was not completely empty after you finished urinating? 1   Over the past month, how often have had to urinate again less than 2 hours after you last finished urinating? 0   Over the past month, how often have you found you stopped and started again several times when you urinated? 1   Over  the past month, how often have you found it dificult to postpone  urination? 1   Over the past month, how often have you had a weak urinary stream? 3   Over the past month, how often have you had to push or strain to begin urinating? 2   Over the past month, how many times did you most tipically get up to urinate from the time you went to bed at night until the time you got up in the moring? 1   AUA Score 9   If you were to spend the rest of your life with your urinary condition the way it is now, how would you feel about that? Pleased       His  AUASS symptoms are detailed in the document flow sheet

## 2012-07-18 LAB — AMB POC UROFLOWMETRY
AVG FLOW RATE: 2 ml/Seconds
FLOW TIME: 64 Seconds
MAX FLOW RATE: 7.1 ml/Seconds
TIME TO MAX, POC: 18 Seconds
VOIDED VOLUME: 135 ml
VOIDING TIME: 186 Seconds

## 2012-07-18 LAB — AMB POC PVR, MEAS,POST-VOID RES,US,NON-IMAGING: PVR: 94 cc

## 2012-07-18 NOTE — Progress Notes (Signed)
Pt here for pvr auass and uroflow.  pvr 94ml.  AUA Assessment Score:  AUA Score: 6;    AUA Bother Rating: Pleased  See uroflow for results

## 2012-12-18 NOTE — Progress Notes (Signed)
Jose Stafford presents today for lab draw per Dr. Nicanor Alcon order.    Patient will be notified by the physician with lab results.    PSA obtained via venipuncture without any difficulty.      No orders of the defined types were placed in this encounter.       Belford Pascucci A Kitrina Maurin on 12/18/2012

## 2012-12-19 LAB — PSA TOTAL+% FREE
PSA, % free: 19 %
PSA, Free: 0.949 ng/mL
Prostate Specific Ag: 5.09 ng/mL — AB (ref 0.00–4.00)

## 2012-12-24 NOTE — Progress Notes (Signed)
Elevated PSA   020909 psa 1.9; 081009 psa 2.3; 020810 psa 1.6; 080910 psa 2.2; 030111 psa 3.1; 161096 psa 2.9; 045409 psa 2.23; 811914 psa 2.5; 782956 psa 2.2; 213086 psa 2.2;  578469 psa 2.4; 100714 psa 5.09,%free psa 19%;   10/04/2009 chart entry - pt has fu appt with me in September having been referred by Dr Burnadette Peter per his retirement; 79 yo with normal psa but abnormal psa velocity; has bph;   11/19/2009 office encounter - Preexam psa pending, pt to call if hasn't heard psa report by two weeks; exam shows 30 gm prostate, l>r wo nodules; reassured pt no sign of cancer; copy of psa to pcp planned;   Psa 2.9, reassuring letter to patient, copy of report to Dr Donette Larry   05/20/2010 medical assistant encounter - psa draw 2.23;  appt near future  05/27/2010 office encounter  - elev psa surveillance in pt with family hx of sig pca; psa range is 1.9 - 3.1, present 2.23; pt anxious about his psa and family hx wants six month checks; urine is micro benign; exam shows 30 gm prostate r>l wo nodules or induration; reassured pt no sign of prostate cancer but at risk due to age and family hx; reassured him pca death unlikely for him;   2010/12/22 medical assistant encounter - psa draw, reassuring stable value of 2.5; appt near future  12/06/2010 office encounter  - elevated psa hx in pt with strong fam hx of pca - brothers and his son and 3 nephews all with pca; reviewed his psa hx which is reassuring with stable value of 2.5; pt wo sxs; feels great - even though 23 he wishes active surveillance; exam shows 30 gm prostate r>l wo nodules or induration; will continue six month surveillance  06/06/2011 medical assistant encounter - psa 2.2, cw, let pt know his psa improved normal at 2.2; arrange appt with me in six months for " ua surveillance bph luts fam hx pca " - i recommend no psa testing; if he insists on psa testing, then get psa level two week prior;   12/08/2011 office encounter  - extensive fam hx of pca; here for fu exam,  psa's have been normal recently so pt deferred psa testing; exam shows 30 gm prostate R>L  wo nodules; continue six months surveillance  psa was drawn for some reason, psa 2.2; no more psa's, letter to patient  06/18/2012 office encounter  - pt insists on continued psa checks in addition to prostate exam due to strong family hx of pca death; exam shows 30 gm prostate l>r wo nodules or induration; psa currently is 2.4, reviewed psa hx with im, recheck in six months;       BPH w LUTS   Early 2000's microwave treatment - successful   [id number] Burnadette Peter) notes This is a follow-up visit for Jose Stafford who has an established diagnosis of BPH with obstruction. There has been improvement of his symptoms. He reports nocturia one time a night. His current medications includes no medications. He has not experienced Problems when on these medications. Since his last visit he notes less frequency less nocturia less urgency   [id number] Burnadette Peter) notes There has been improvement of his symptoms. He reports nocturia is rare now. He is bothered by some daytime urgency, which has become more noticeable over the past 6 mos. His current medications includes no medications. Since his last visit he notes less frequency less nocturia But more daytime urgency; PLAN Sanctura trial  There has been further improvement of his symptoms. He reports nocturia is rare now, and not more than once nightly. He was bothered by some daytime urgency, but began Sanctura with good improvement.   [id number] Burnadette Peter) notes His current medications includes no BPH medications. He continues on Reunion, with good results.   [id number] Burnadette Peter) notes There has been improvement of his symptoms. He reports nocturia one or two times a night. He is emptying well, with a PVR of 310 cc today. He is reasonably happy with his voiding despite this. His current medications includes Sanctura 60 mgm qHS. He has not experienced side effects on this medication.   [id number] [name]) notes  irritable sxs controlled with sanctura, notes 40 gm benign prostate   10/04/2009 chart entry - chart reviewed, note pt has rx for sudafed and is on sanctura with 40 gm prostate - will discuss impact of this rx on bph sxs and increased pvr;   11/19/2009 office encounter - bph surveillance, reviewed risk of sudafed with bph - uses rarely; remains on sanctura xr on empty stomach, pleased with bladder control; discussed trial of cessation if he would like since doing; denies hematuria or dysuria; urine is dip and micro negative, exam shows 30 gm benign prostate l>r wo Nodules; ok to trial cesation of sanctura if he'd like   05/01/2010 chart entry ??? changed insurance needs alternative for sanctura since not on his insurance approved list, meds listed include oxybutynin, oxybutynin er 5, 10, 15, enablex, gelnique, vesicare; will go with vesicare 10 mg #30 rfx11, sent to [facility] pharmacy  05/06/2010 chart entry ??? vesicare too expensive, pt wants generic, pt advised of short term memory loss associated with oxybutynin; Rx oxbutynin er 10 mg daily #30 rfx11  05/27/2010 office encounter  - pt opted to stay w vesicare and avoid side effects with the oxybutynin; his AUA symptom score (detailed in the documents flowsheet) is only 2 on vesicare; pt pleased; will titrate dose to every other and then every third; whatever minimum dosage is effective;   12/06/2010 office encounter  - weaned off vesicare, his AUA symptom score (detailed in the documents flowsheet) is 7; denies hematuria or dysuria; ua dip and micro negative;   12/08/2011 office encounter  - bph surveillance, no meds; pt satisfied with voiding; his AUA symptom score (detailed in the documents flowsheet) is 4, denies hematuria or dysuria; ua dip and micro negative; occl urgency  06/18/2012 office encounter  - not on any meds right now, stream a little slower; his AUA symptom score (detailed in the documents flowsheet) is 9; denies hematuria or dysuria; ua dip and micro negative;  exam shows 30 gm benign prostate wo nodules or induration; will check pvr; pt defers any meds for now; if beomes bothered will call and we will trial flomax;       IBE   POST VOID RESIDUAL  10/22/2007  04/21/2008  10/20/2008    PVR  72ml  58cc  310   11/19/2009 office encounter - auass 4, pvr 45 cc, pt wo bother   05/27/2010 office encounter  - auass 2, under influence of vesicare  12/06/2010 office encounter  - auass 7, has stopped vesicare, pvr 10cc  12/08/2011 office encounter  - auass 4 wo bother, no meds  06/18/2012 office encounter  - auass 9, stream a little slower; pvr 163 cc (initially 269, had pt double void and pvr 163 cc) will recheck pvr in a month;       Family  Hx of Prostate Cancer   11/19/2009 office encounter - reports 3 brothers with pca  12/06/2010 office encounter  - now his 72 yo son has been diagnosed with pca;

## 2012-12-25 LAB — AMB POC URINALYSIS DIP STICK AUTO W/ MICRO (MICRO RESULTS)
Bacteria (UA POC): NEGATIVE
Bilirubin (UA POC): NEGATIVE
Blood (UA POC): NEGATIVE
Glucose (UA POC): NEGATIVE
Ketones (UA POC): NEGATIVE
Leukocyte esterase (UA POC): NEGATIVE
Nitrites (UA POC): NEGATIVE
Protein (UA POC): NEGATIVE mg/dL
RBCs (UA POC): NEGATIVE
Specific gravity (UA POC): 1.01 (ref 1.001–1.035)
Urobilinogen (UA POC): 0.2 (ref 0.2–1)
WBCs (UA POC): NEGATIVE
pH (UA POC): 5.5 (ref 4.6–8.0)

## 2012-12-25 LAB — AMB POC PVR, MEAS,POST-VOID RES,US,NON-IMAGING: PVR: 184 cc

## 2012-12-25 NOTE — Progress Notes (Signed)
Jose Heys, MD  [facility], [facility] [place]  [address]  [place], [place] [address]  [id number]    Patient:  Jose Stafford  DOB:  05-11-1927  Date of Service:  12/25/2012    ASSESSMENT/PLAN    Encounter Diagnoses   Name Primary?   ??? BPH w urinary obs/LUTS Yes   ??? Incomplete bladder emptying    ??? Urgency of urination    ??? Family hx of prostate cancer        Assessment:  Sudden rise in psa over the past six months inpatient with sig fam hx of pca and pca death; bph/luts some bother, defers meds  Plan:  F/t psa in 3 months if stable, then anohter f/t psa in six months and exam in six months; if psa rises more, then will proceed with nbxp    HISTORY OF PRESENT ILLNESS  He is currently 79 y.o. and his primary care provider is [name], MD.    Chief Complaint   Patient presents with   ??? Benign Prostatic Hypertrophy   ??? (LUTS) Lower Urinary Tract Symptoms       Elevated PSA   020909 psa 1.9; 081009 psa 2.3; 020810 psa 1.6; 914782 psa 2.2; 956213 psa 3.1; 086578 psa 2.9; 469629 psa 2.23; 528413 psa 2.5; 244010 psa 2.2; 272536 psa 2.2;  644034 psa 2.4; 100714 psa 5.09,%free psa 19%;   10/04/2009 chart entry - pt has fu appt with me in September having been referred by Dr Burnadette Peter per his retirement; 79 yo with normal psa but abnormal psa velocity; has bph;   11/19/2009 office encounter - Preexam psa pending, pt to call if hasn't heard psa report by two weeks; exam shows 30 gm prostate, l>r wo nodules; reassured pt no sign of cancer; copy of psa to pcp planned;   Psa 2.9, reassuring letter to patient, copy of report to Dr Donette Larry   05/20/2010 medical assistant encounter - psa draw 2.23;  appt near future  05/27/2010 office encounter  - elev psa surveillance in pt with family hx of sig pca; psa range is 1.9 - 3.1, present 2.23; pt anxious about his psa and family hx wants six month checks; urine is micro benign; exam shows 30 gm prostate r>l wo nodules or induration; reassured pt no sign of  prostate cancer but at risk due to age and family hx; reassured him pca death unlikely for him;   07-Dec-2010 medical assistant encounter - psa draw, reassuring stable value of 2.5; appt near future  12/06/2010 office encounter  - elevated psa hx in pt with strong fam hx of pca - brothers and his son and 3 nephews all with pca; reviewed his psa hx which is reassuring with stable value of 2.5; pt wo sxs; feels great - even though 36 he wishes active surveillance; exam shows 30 gm prostate r>l wo nodules or induration; will continue six month surveillance  06/06/2011 medical assistant encounter - psa 2.2, cw, let pt know his psa improved normal at 2.2; arrange appt with me in six months for " ua surveillance bph luts fam hx pca " - i recommend no psa testing; if he insists on psa testing, then get psa level two week prior;   12/08/2011 office encounter  - extensive fam hx of pca; here for fu exam, psa's have been normal recently so pt deferred psa testing; exam shows 30 gm prostate R>L  wo nodules; continue six months surveillance  psa  was drawn for some reason, psa 2.2; no more psa's, letter to patient  06/18/2012 office encounter  - pt insists on continued psa checks in addition to prostate exam due to strong family hx of pca death; exam shows 30 gm prostate l>r wo nodules or induration; psa currently is 2.4, reviewed psa hx with im, recheck in six months;   12/25/2012 office encounter  - pt concerned about psa jump from 2.4 to 5; has sig fam hx pca; appetite good, weight stable, no unusual bone or joint pains; 30 gm prostate l>r wo nodules or induration; will recheck f/t psa in thee months, as long as stable, then fu exam in six months with f/t psa      BPH w LUTS   Early 2000's microwave treatment - successful   [id number] Burnadette Peter) notes This is a follow-up visit for Jose Stafford who has an established diagnosis of BPH with obstruction. There has been improvement of his symptoms. He reports nocturia one time a night. His  current medications includes no medications. He has not experienced Problems when on these medications. Since his last visit he notes less frequency less nocturia less urgency   [id number] Burnadette Peter) notes There has been improvement of his symptoms. He reports nocturia is rare now. He is bothered by some daytime urgency, which has become more noticeable over the past 6 mos. His current medications includes no medications. Since his last visit he notes less frequency less nocturia But more daytime urgency; PLAN Sanctura trial   There has been further improvement of his symptoms. He reports nocturia is rare now, and not more than once nightly. He was bothered by some daytime urgency, but began Sanctura with good improvement.   [id number] Burnadette Peter) notes His current medications includes no BPH medications. He continues on Reunion, with good results.   [id number] Burnadette Peter) notes There has been improvement of his symptoms. He reports nocturia one or two times a night. He is emptying well, with a PVR of 310 cc today. He is reasonably happy with his voiding despite this. His current medications includes Sanctura 60 mgm qHS. He has not experienced side effects on this medication.   [id number] [name]) notes irritable sxs controlled with sanctura, notes 40 gm benign prostate   10/04/2009 chart entry - chart reviewed, note pt has rx for sudafed and is on sanctura with 40 gm prostate - will discuss impact of this rx on bph sxs and increased pvr;   11/19/2009 office encounter - bph surveillance, reviewed risk of sudafed with bph - uses rarely; remains on sanctura xr on empty stomach, pleased with bladder control; discussed trial of cessation if he would like since doing; denies hematuria or dysuria; urine is dip and micro negative, exam shows 30 gm benign prostate l>r wo Nodules; ok to trial cesation of sanctura if he'd like   05/01/2010 chart entry ??? changed insurance needs alternative for sanctura since not on his insurance approved list, meds listed  include oxybutynin, oxybutynin er 5, 10, 15, enablex, gelnique, vesicare; will go with vesicare 10 mg #30 rfx11, sent to [facility] pharmacy  05/06/2010 chart entry ??? vesicare too expensive, pt wants generic, pt advised of short term memory loss associated with oxybutynin; Rx oxbutynin er 10 mg daily #30 rfx11  05/27/2010 office encounter  - pt opted to stay w vesicare and avoid side effects with the oxybutynin; his AUA symptom score (detailed in the documents flowsheet) is only 2 on vesicare; pt pleased; will titrate dose to every other  and then every third; whatever minimum dosage is effective;   12/06/2010 office encounter  - weaned off vesicare, his AUA symptom score (detailed in the documents flowsheet) is 7; denies hematuria or dysuria; ua dip and micro negative;   12/08/2011 office encounter  - bph surveillance, no meds; pt satisfied with voiding; his AUA symptom score (detailed in the documents flowsheet) is 4, denies hematuria or dysuria; ua dip and micro negative; occl urgency  06/18/2012 office encounter  - not on any meds right now, stream a little slower; his AUA symptom score (detailed in the documents flowsheet) is 9; denies hematuria or dysuria; ua dip and micro negative; exam shows 30 gm benign prostate wo nodules or induration; will check pvr; pt defers any meds for now; if beomes bothered will call and we will trial flomax;   12/25/2012 office encounter  - bph surveillance, mild bother, pt defers meds in spite of stop and start nature to stream;       IBE /Weak Stream  POST VOID RESIDUAL  10/22/2007  04/21/2008  10/20/2008    PVR  72ml  58cc  310   11/19/2009 office encounter - auass 4, pvr 45 cc, pt wo bother   05/27/2010 office encounter  - auass 2, under influence of vesicare  12/06/2010 office encounter  - auass 7, has stopped vesicare, pvr 10cc  12/08/2011 office encounter  - auass 4 wo bother, no meds  06/18/2012 office encounter  - auass 9, stream a little slower; pvr 163 cc (initially 269, had pt double void  and pvr 163 cc) will recheck pvr in a month;   12/25/2012 office encounter  - auass 13, not bothered, pvr 184 cc;       Family Hx of Prostate Cancer   11/19/2009 office encounter - reports 3 brothers with pca  12/06/2010 office encounter  - now his 53 yo son has been diagnosed with pca;  12/25/2012 office encounter  -  Sig fam hx of pca;     PAST MEDICAL HISTORY    Past Surgical History   Procedure Laterality Date   ??? Hx orthopaedic     ??? Pr cardiac surg procedure unlist         Past Medical History   Diagnosis Date   ??? Elevated PSA    ??? Family hx of prostate cancer    ??? Urgency of urination    ??? Benign localized hyperplasia of prostate with urinary obstruction and other lower urinary tract symptoms (LUTS)(600.21)    ??? Incomplete bladder emptying    ??? BPH without obstruction/lower urinary tract symptoms    ??? Urgency of urination      Current Outpatient Prescriptions   Medication Sig Dispense Refill   ??? oxycodone-acetaminophen (PERCOCET) 5-325 mg per tablet        ??? gabapentin (NEURONTIN) 300 mg capsule        ??? OMEPRAZOLE (PRILOSEC PO) Take  by mouth.       ??? ipratropium (ATROVENT) 0.03 % nasal spray 2 Sprays every twelve (12) hours.       ??? acetaminophen (TYLENOL) 325 mg tablet Take  by mouth every four (4) hours as needed.       ??? sertraline (ZOLOFT) 25 mg tablet Take  by mouth daily.       ??? montelukast (SINGULAIR) 10 mg tablet Take 10 mg by mouth daily.         ??? Cetirizine (ZYRTEC) 10 mg Cap Take  by mouth.         ???  aspirin delayed-release 81 mg tablet Take  by mouth daily.         ??? GUAIFENESIN (MUCINEX PO) Take  by mouth.           Allergies   Allergen Reactions   ??? Milk Contact Dermatitis   ??? Sulfa (Sulfonamide Antibiotics) Unknown (comments)     Family History   Problem Relation Age of Onset   ??? Cancer     ??? Hypertension         Social  History   Smoking status   ??? Never Smoker    Smokeless tobacco   ??? Never Used     History   Alcohol Use   ??? 0.5 oz/week   ??? 1 Glasses of wine per week        REVIEW OF SYSTEMS   Constitutional: Fever: No  Skin: Rash: No  HEENT: Hearing difficulty: No  Eyes: Blurred vision: No  Cardiovascular: Chest pain: No  Respiratory: Shortness of breath: No  Gastrointestinal: Nausea/vomiting: No  Musculoskeletal: Back pain: Yes  Neurological: Weakness: Yes  Psychological: Memory loss: No  Comments/additional findings:      PHYSICAL EXAM  BP 120/80   Ht 5\' 7"  (1.702 m)   Wt 163 lb (73.936 kg)   BMI 25.52 kg/m2    The BMI follow up plan is as follows: bmi ok    GENERAL APPEARANCE:  79 y.o. Caucasian  male alert, cooperative, no distress, appears stated age not obese;  ABDOMEN:  flat soft, non-tender. Bowel sounds normal. No masses,  no organomegaly  BACK:  symmetric, no curvature. ROM normal. No CVA tenderness.  GENITOURINARY:    Penis:  normal, noncircumcised  Scrotum:  normal  Testes:  normal, no masses, size 20cc  Epididymides:  normal  Anus/Perineum:  within normal limits  Sphincter tone:  within normal limits  Rectum:  negative without mass, lesions or tenderness    Prostate:  30 gm prostate l>r wo nodules or induration  Seminal vesicles:  nonpalpable  LYMPHATIC:  Cervical, supraclavicular, and axillary nodes normal.      GU DATA/REVIEW OF LABS AND IMAGING    AUA Assessment Score:  AUA Score: 13;    AUA Bother Rating: Pleased    AUA Symptom Score 12/25/2012   Over the past month how often have you had the sensation that your bladder was not completely empty after you finished urinating? 2   Over the past month, how often have had to urinate again less than 2 hours after you last finished urinating? 2   Over the past month, how often have you found you stopped and started again several times when you urinated? 4   Over the past month, how often have you found it difficult to postpone urination? 1   Over the past month, how often have you had a weak urinary stream? 2   Over the past month, how often have you had to push or strain to begin urinating? 1   Over the past month, how many times did you most  typically get up to urinate from the time you went to bed at night until the time you got up in the morning? 1   AUA Score 13   If you were to spend the rest of your life with your urinary condition the way it is now, how would you feel about that? Pleased       Results for orders placed in visit on 12/25/12   AMB POC PVR, MEAS,POST-VOID  RES,US,NON-IMAGING       Result Value Range    PVR 184     AMB POC URINALYSIS DIP STICK AUTO W/ MICRO (MICRO RESULTS)       Result Value Range    Color (UA POC) Yellow      Clarity (UA POC) Clear      Glucose (UA POC) Negative  Negative    Bilirubin (UA POC) Negative  Negative    Ketones (UA POC) Negative  Negative    Specific gravity (UA POC) 1.010  1.001 - 1.035    Blood (UA POC) Negative  Negative    pH (UA POC) 5.5  4.6 - 8.0    Protein (UA POC) Negative  Negative mg/dL    Urobilinogen (UA POC) 0.2 mg/dL  0.2 - 1    Nitrites (UA POC) Negative  Negative    Leukocyte esterase (UA POC) Negative  Negative    Epithelial cells (UA POC)        WBCs (UA POC) n      RBCs (UA POC) n      Bacteria (UA POC) n      Crystals (UA POC)    Negative    Other (UA POC)           Other Lab Data Reviewed with patient:   YES    PSA    Copy to [name], MD and other appropriate care team members eFaxed today.     Jose Heys, MD  12/25/2012

## 2013-01-08 NOTE — Progress Notes (Addendum)
Jose Stafford presents today for lab draw per Dr. Lasater order.    Patient will be notified by the physician with lab results.    F/T PSA obtained via venipuncture without any difficulty.      No orders of the defined types were placed in this encounter.       Azariel Banik E Shalynn Jorstad on 01/08/2013       Reviewed by John Lasater, MD 01/14/2013

## 2013-01-10 LAB — PSA TOTAL+% FREE
PSA, % Free: 33.5 %
PSA, Free: 0.77 ng/mL
Prostate Specific Ag: 2.3 ng/mL (ref 0.0–4.0)

## 2013-01-10 NOTE — Progress Notes (Deleted)
Elevated PSA   020909 psa 1.9; 081009 psa 2.3; 020810 psa 1.6; 080910 psa 2.2; 030111 psa 3.1; 161096 psa 2.9; 045409 psa 2.23; 811914 psa 2.5; 782956 psa 2.2; 213086 psa 2.2;  578469 psa 2.4; 100714 psa 5.09,%free psa 19%; 629528 psa 2.3,%free psa 33.5%;  10/04/2009 chart entry - pt has fu appt with me in September having been referred by Dr Jose Stafford per his retirement; 79 yo with normal psa but abnormal psa velocity; has bph;   11/19/2009 office encounter - Preexam psa pending, pt to call if hasn't heard psa report by two weeks; exam shows 30 gm prostate, l>r wo nodules; reassured pt no sign of cancer; copy of psa to pcp planned;   Psa 2.9, reassuring letter to patient, copy of report to Dr Donette Larry   05/20/2010 medical assistant encounter - psa draw 2.23;  appt near future  05/27/2010 office encounter  - elev psa surveillance in pt with family hx of sig pca; psa range is 1.9 - 3.1, present 2.23; pt anxious about his psa and family hx wants six month checks; urine is micro benign; exam shows 30 gm prostate r>l wo nodules or induration; reassured pt no sign of prostate cancer but at risk due to age and family hx; reassured him pca death unlikely for him;   December 24, 2010 medical assistant encounter - psa draw, reassuring stable value of 2.5; appt near future  12/06/2010 office encounter  - elevated psa hx in pt with strong fam hx of pca - brothers and his son and 3 nephews all with pca; reviewed his psa hx which is reassuring with stable value of 2.5; pt wo sxs; feels great - even though 12 he wishes active surveillance; exam shows 30 gm prostate r>l wo nodules or induration; will continue six month surveillance  06/06/2011 medical assistant encounter - psa 2.2, cw, let pt know his psa improved normal at 2.2; arrange appt with me in six months for " ua surveillance bph luts fam hx pca " - i recommend no psa testing; if he insists on psa testing, then get psa level two week prior;   12/08/2011 office encounter  - extensive fam  hx of pca; here for fu exam, psa's have been normal recently so pt deferred psa testing; exam shows 30 gm prostate R>L  wo nodules; continue six months surveillance  psa was drawn for some reason, psa 2.2; no more psa's, letter to patient  06/18/2012 office encounter  - pt insists on continued psa checks in addition to prostate exam due to strong family hx of pca death; exam shows 30 gm prostate l>r wo nodules or induration; psa currently is 2.4, reviewed psa hx with im, recheck in six months;   12/25/2012 office encounter  - pt concerned about psa jump from 2.4 to 5; has sig fam hx pca; appetite good, weight stable, no unusual bone or joint pains; 30 gm prostate l>r wo nodules or induration; will recheck f/t psa in thee months, as long as stable, then fu exam in six months with f/t psa  01/08/2013 medical assistant encounter - pt not willing to wait three months for recheck psa, repeat psa 2.3 with reassuring %free psa 33.5%  01/14/2013 telephone encounter - debbie, call pt, im sure he already knows value but his repeat psa 2.3 and %free psa is normal 33.5%, i recommend no further psa testing but pt insists on psa testing; cancel planned appt in January for psa testing; ask him when he wants his next  psa done? Likely arrange nurse appt mid April for f/t psa and exam with me late April for " pvr/ua surveillance fam hx pca bph luts ibe (confirm f/t psa prior) "       BPH w LUTS   Early 2000's microwave treatment - successful   [id number] Jose Stafford) notes This is a follow-up visit for Jose Stafford who has an established diagnosis of BPH with obstruction. There has been improvement of his symptoms. He reports nocturia one time a night. His current medications includes no medications. He has not experienced Problems when on these medications. Since his last visit he notes less frequency less nocturia less urgency   020909 Jose Stafford) notes There has been improvement of his symptoms. He reports nocturia is rare now. He is bothered  by some daytime urgency, which has become more noticeable over the past 6 mos. His current medications includes no medications. Since his last visit he notes less frequency less nocturia But more daytime urgency; PLAN Sanctura trial   There has been further improvement of his symptoms. He reports nocturia is rare now, and not more than once nightly. He was bothered by some daytime urgency, but began Sanctura with good improvement.   [id number] Jose Stafford) notes His current medications includes no BPH medications. He continues on [place]union, with good results.   811914 Jose Stafford) notes There has been improvement of his symptoms. He reports nocturia one or two times a night. He is emptying well, with a PVR of 310 cc today. He is reasonably happy with his voiding despite this. His current medications includes Sanctura 60 mgm qHS. He has not experienced side effects on this medication.   [id number] Jose Stafford) notes irritable sxs controlled with sanctura, notes 40 gm benign prostate   10/04/2009 chart entry - chart reviewed, note pt has rx for sudafed and is on sanctura with 40 gm prostate - will discuss impact of this rx on bph sxs and increased pvr;   11/19/2009 office encounter - bph surveillance, reviewed risk of sudafed with bph - uses rarely; remains on sanctura xr on empty stomach, pleased with bladder control; discussed trial of cessation if he would like since doing; denies hematuria or dysuria; urine is dip and micro negative, exam shows 30 gm benign prostate l>r wo Nodules; ok to trial cesation of sanctura if he'd like   05/01/2010 chart entry ??? changed insurance needs alternative for sanctura since not on his insurance approved list, meds listed include oxybutynin, oxybutynin er 5, 10, 15, enablex, gelnique, vesicare; will go with vesicare 10 mg #30 rfx11, sent to [facility] pharmacy  05/06/2010 chart entry ??? vesicare too expensive, pt wants generic, pt advised of short term memory loss associated with oxybutynin; Rx oxbutynin er 10 mg  daily #30 rfx11  05/27/2010 office encounter  - pt opted to stay w vesicare and avoid side effects with the oxybutynin; his AUA symptom score (detailed in the documents flowsheet) is only 2 on vesicare; pt pleased; will titrate dose to every other and then every third; whatever minimum dosage is effective;   12/06/2010 office encounter  - weaned off vesicare, his AUA symptom score (detailed in the documents flowsheet) is 7; denies hematuria or dysuria; ua dip and micro negative;   12/08/2011 office encounter  - bph surveillance, no meds; pt satisfied with voiding; his AUA symptom score (detailed in the documents flowsheet) is 4, denies hematuria or dysuria; ua dip and micro negative; occl urgency  06/18/2012 office encounter  - not on any meds right  now, stream a little slower; his AUA symptom score (detailed in the documents flowsheet) is 9; denies hematuria or dysuria; ua dip and micro negative; exam shows 30 gm benign prostate wo nodules or induration; will check pvr; pt defers any meds for now; if beomes bothered will call and we will trial flomax;   12/25/2012 office encounter  - bph surveillance, mild bother, pt defers meds in spite of stop and start nature to stream;       IBE /Weak Stream  POST VOID RESIDUAL  10/22/2007  04/21/2008  10/20/2008    PVR  72ml  58cc  310   11/19/2009 office encounter - auass 4, pvr 45 cc, pt wo bother   05/27/2010 office encounter  - auass 2, under influence of vesicare  12/06/2010 office encounter  - auass 7, has stopped vesicare, pvr 10cc  12/08/2011 office encounter  - auass 4 wo bother, no meds  06/18/2012 office encounter  - auass 9, stream a little slower; pvr 163 cc (initially 269, had pt double void and pvr 163 cc) will recheck pvr in a month;   12/25/2012 office encounter  - auass 13, not bothered, pvr 184 cc;       Family Hx of Prostate Cancer   11/19/2009 office encounter - reports 3 brothers with pca  12/06/2010 office encounter  - now his 46 yo son has been diagnosed with pca;   12/25/2012 office encounter  -  Sig fam hx of pca;

## 2013-01-14 NOTE — Progress Notes (Signed)
Pt already aware of results.  Pt had April apt with jdl.  F/t psa scheduled

## 2013-01-14 NOTE — Progress Notes (Signed)
Jose Stafford presents today for lab draw per Dr. Nicanor Alcon order.    Patient will be notified by the physician with lab results.    F/T PSA obtained via venipuncture without any difficulty.      No orders of the defined types were placed in this encounter.       Lorenza Cambridge on 01/08/2013       Reviewed by Leone Payor, MD 01/14/2013

## 2013-01-14 NOTE — Progress Notes (Signed)
Elevated PSA   020909 psa 1.9; 081009 psa 2.3; 020810 psa 1.6; 080910 psa 2.2; 030111 psa 3.1; 090811 psa 2.9; 030812 psa 2.23; 091812 psa 2.5; 032513 psa 2.2; 092613 psa 2.2;  032614 psa 2.4; 100714 psa 5.09,%free psa 19%; 102814 psa 2.3,%free psa 33.5%;  10/04/2009 chart entry - pt has fu appt with me in September having been referred by Dr Lynch per his retirement; 79 yo with normal psa but abnormal psa velocity; has bph;   11/19/2009 office encounter - Preexam psa pending, pt to call if hasn't heard psa report by two weeks; exam shows 30 gm prostate, l>r wo nodules; reassured pt no sign of cancer; copy of psa to pcp planned;   Psa 2.9, reassuring letter to patient, copy of report to Dr Sharp-Warthan   05/20/2010 medical assistant encounter - psa draw 2.23;  appt near future  05/27/2010 office encounter  - elev psa surveillance in pt with family hx of sig pca; psa range is 1.9 - 3.1, present 2.23; pt anxious about his psa and family hx wants six month checks; urine is micro benign; exam shows 30 gm prostate r>l wo nodules or induration; reassured pt no sign of prostate cancer but at risk due to age and family hx; reassured him pca death unlikely for him;   11/30/2010 medical assistant encounter - psa draw, reassuring stable value of 2.5; appt near future  12/06/2010 office encounter  - elevated psa hx in pt with strong fam hx of pca - brothers and his son and 3 nephews all with pca; reviewed his psa hx which is reassuring with stable value of 2.5; pt wo sxs; feels great - even though 83 he wishes active surveillance; exam shows 30 gm prostate r>l wo nodules or induration; will continue six month surveillance  06/06/2011 medical assistant encounter - psa 2.2, cw, let pt know his psa improved normal at 2.2; arrange appt with me in six months for " ua surveillance bph luts fam hx pca " - i recommend no psa testing; if he insists on psa testing, then get psa level two week prior;   12/08/2011 office encounter  - extensive fam  hx of pca; here for fu exam, psa's have been normal recently so pt deferred psa testing; exam shows 30 gm prostate R>L  wo nodules; continue six months surveillance  psa was drawn for some reason, psa 2.2; no more psa's, letter to patient  06/18/2012 office encounter  - pt insists on continued psa checks in addition to prostate exam due to strong family hx of pca death; exam shows 30 gm prostate l>r wo nodules or induration; psa currently is 2.4, reviewed psa hx with im, recheck in six months;   12/25/2012 office encounter  - pt concerned about psa jump from 2.4 to 5; has sig fam hx pca; appetite good, weight stable, no unusual bone or joint pains; 30 gm prostate l>r wo nodules or induration; will recheck f/t psa in thee months, as long as stable, then fu exam in six months with f/t psa  01/08/2013 medical assistant encounter - pt not willing to wait three months for recheck psa, repeat psa 2.3 with reassuring %free psa 33.5%  01/14/2013 telephone encounter - debbie, call pt, im sure he already knows value but his repeat psa 2.3 and %free psa is normal 33.5%, i recommend no further psa testing but pt insists on psa testing; cancel planned appt in January for psa testing; ask him when he wants his next   psa done? Likely arrange nurse appt mid April for f/t psa and exam with me late April for " pvr/ua surveillance fam hx pca bph luts ibe (confirm f/t psa prior) "       BPH w LUTS   Early 2000's microwave treatment - successful   080808 (Lynch) notes This is a follow-up visit for Markham Mignano who has an established diagnosis of BPH with obstruction. There has been improvement of his symptoms. He reports nocturia one time a night. His current medications includes no medications. He has not experienced Problems when on these medications. Since his last visit he notes less frequency less nocturia less urgency   020909 (Lynch) notes There has been improvement of his symptoms. He reports nocturia is rare now. He is bothered  by some daytime urgency, which has become more noticeable over the past 6 mos. His current medications includes no medications. Since his last visit he notes less frequency less nocturia But more daytime urgency; PLAN Sanctura trial   There has been further improvement of his symptoms. He reports nocturia is rare now, and not more than once nightly. He was bothered by some daytime urgency, but began Sanctura with good improvement.   081009 (Lynch) notes His current medications includes no BPH medications. He continues on Sanctura, with good results.   080910 (Lynch) notes There has been improvement of his symptoms. He reports nocturia one or two times a night. He is emptying well, with a PVR of 310 cc today. He is reasonably happy with his voiding despite this. His current medications includes Sanctura 60 mgm qHS. He has not experienced side effects on this medication.   030111 (Oscar) notes irritable sxs controlled with sanctura, notes 40 gm benign prostate   10/04/2009 chart entry - chart reviewed, note pt has rx for sudafed and is on sanctura with 40 gm prostate - will discuss impact of this rx on bph sxs and increased pvr;   11/19/2009 office encounter - bph surveillance, reviewed risk of sudafed with bph - uses rarely; remains on sanctura xr on empty stomach, pleased with bladder control; discussed trial of cessation if he would like since doing; denies hematuria or dysuria; urine is dip and micro negative, exam shows 30 gm benign prostate l>r wo Nodules; ok to trial cesation of sanctura if he'd like   05/01/2010 chart entry ??? changed insurance needs alternative for sanctura since not on his insurance approved list, meds listed include oxybutynin, oxybutynin er 5, 10, 15, enablex, gelnique, vesicare; will go with vesicare 10 mg #30 rfx11, sent to [facility] pharmacy  05/06/2010 chart entry ??? vesicare too expensive, pt wants generic, pt advised of short term memory loss associated with oxybutynin; Rx oxbutynin er 10 mg  daily #30 rfx11  05/27/2010 office encounter  - pt opted to stay w vesicare and avoid side effects with the oxybutynin; his AUA symptom score (detailed in the documents flowsheet) is only 2 on vesicare; pt pleased; will titrate dose to every other and then every third; whatever minimum dosage is effective;   12/06/2010 office encounter  - weaned off vesicare, his AUA symptom score (detailed in the documents flowsheet) is 7; denies hematuria or dysuria; ua dip and micro negative;   12/08/2011 office encounter  - bph surveillance, no meds; pt satisfied with voiding; his AUA symptom score (detailed in the documents flowsheet) is 4, denies hematuria or dysuria; ua dip and micro negative; occl urgency  06/18/2012 office encounter  - not on any meds right   now, stream a little slower; his AUA symptom score (detailed in the documents flowsheet) is 9; denies hematuria or dysuria; ua dip and micro negative; exam shows 30 gm benign prostate wo nodules or induration; will check pvr; pt defers any meds for now; if beomes bothered will call and we will trial flomax;   12/25/2012 office encounter  - bph surveillance, mild bother, pt defers meds in spite of stop and start nature to stream;       IBE /Weak Stream  POST VOID RESIDUAL  10/22/2007  04/21/2008  10/20/2008    PVR  72ml  58cc  310   11/19/2009 office encounter - auass 4, pvr 45 cc, pt wo bother   05/27/2010 office encounter  - auass 2, under influence of vesicare  12/06/2010 office encounter  - auass 7, has stopped vesicare, pvr 10cc  12/08/2011 office encounter  - auass 4 wo bother, no meds  06/18/2012 office encounter  - auass 9, stream a little slower; pvr 163 cc (initially 269, had pt double void and pvr 163 cc) will recheck pvr in a month;   12/25/2012 office encounter  - auass 13, not bothered, pvr 184 cc;       Family Hx of Prostate Cancer   11/19/2009 office encounter - reports 3 brothers with pca  12/06/2010 office encounter  - now his 51 yo son has been diagnosed with pca;   12/25/2012 office encounter  -  Sig fam hx of pca;

## 2013-02-01 NOTE — Progress Notes (Signed)
[facility]  [address] [place], [place], [place] [address]  Phone: [phone]   Fax: [phone]    Plan of Care/ Statement of Necessity for Physical Therapy Services    Patient name: Jose Stafford Start of Care: 02/01/2013   Referral source: Azucena Fallen, MD DOB: 1927-11-20    Medical Diagnosis: Pain in joint, pelvic region and thigh [719.45]   Onset Date:3-6 months ago    Treatment Diagnosis: L hip pain   Prior Hospitalization: see medical history Provider#: [id number]   Medications: Verified on Patient summary List    Comorbidities: arthritis, peripheral neuropathy, mitral valve prolapse, atrial fib, subdural hematoma, shoulder surgery x3, carpal tunnel surgery x2   Prior Level of Function: able to stand and walk without pain      The Plan of Care and following information is based on the information from the initial evaluation.  Physical Therapy Evaluation- Hip    The pt reports that he has had L hip pain for the past 3-6 months which got worse in the past month. During the past month he had company and they did a lot of walking around [place] and he was pushing his wife in a wheelchair. He has also noticed some back pain on the L side which comes and goes and he is not sure if it is related or not. He went to see the doctor and had a x-ray done and was told that there wasn't any significant arthritis and the joint looks good. He was told he has bursitis and given a cortisone injection which he thinks might have decreased his pain but it didn't make it go away.  The pt rates his current pain a 2-3/10 while he is sitting. He rates his pain an 8/10 at worst and a 0/10 at best.  Functional limitations: the pt has increased pain after standing for 5-10 minutes, he has increased pain after walking 20-17ft, sometimes he has difficulty sleeping because of pain and he is unable to sleep on his L side, he has some pain with climbing stairs as well  The pt scored a 55/80 on the LEFS  today.  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     Gait:  []  Normal    [x]  Abnormal    []  Antalgic    []  NWB    Device:    Describe: decreased hip flexion and extension, increased knee flexion, bilateral toeing out         ROM/Strength        AROM              Strength (1-5)  Hip Right Left Right Left   Flexion 103 104 5 4+   Glute max  (standing)   5 4+   Glute med   4- 4   Abduction 29 26/p! 5 4+   Adduction   5 3-   ER   5 4+   IR   4+ 4   Knee Right Left Right Left   Extension   5 5   Flexion  (seated)   4+ 4        Flexibility: []  Unable to assess at this time  Hamstrings:    (L) Tightness= []  WNL   []  Min   [x]  Mod   []  Severe    (R) Tightness= []  WNL   []  Min   [x]  Mod   []  Severe  Gastroc:    (L) Tightness= []  WNL   [x]  Min   []  Mod   []  Severe    (R) Tightness= []  WNL   [x]  Min   []  Mod   []  Severe                                  Other tests/ comments:  L greater trochanter tender to palpation  + L ober's test  + FABER's on L  - Scour test  Tenderness to palpation along bilateral IT band but greater on the L    Assessment/ key information: 80 yo male presents with decreased hip strength and painful ROM most likely due to bursa irritation. He has muscle shortness and IT band shortness which could be contributing to this bursa irritation. He would benefit from therapy to increase his strength and reduce muscle and fascia restrictions so that he is able to return to his PLOF.    Problem List: pain affecting function, decrease strength, impaired gait/ balance, decrease ADL/ functional abilitiies, decrease activity tolerance and decrease flexibility/ joint mobility   Treatment Plan may include any combination of the following: Therapeutic exercise, Therapeutic activities, Neuromuscular re-education, Physical agent/modality, Gait/balance training, Manual therapy, Aquatic therapy and Patient education  Patient / Family readiness to learn indicated by: asking questions,  trying to perform skills and interest  Persons(s) to be included in education: patient (P)  Barriers to Learning/Limitations: no  Patient Goal (s): ???minimize or eradicate pain???  Patient Self Reported Health Status: good  Rehabilitation Potential: good    Short Term Goals: To be accomplished in 4  treatments:   1) L standing glute max strength will increase to 5/5.    Long Term Goals: To be accomplished in 8  treatments:   1) The pt will be able to walk for at least 15 minutes without increased pain so he can complete shopping tasks.   2) The pt will be able to stand for 20 minutes without increased pain for cooking and cleaning tasks.   3) L abduction and glute med strength will increase to 5/5 to provide stability for standing and walking.    Frequency / Duration: Patient to be seen 2 times per week for 4  weeks.    Patient/ Caregiver education and instruction: other plan of care   [x]   Plan of care has been reviewed with PTA    G-Codes (GP)  Mobility  [phone] Current  CJ= 20-39%  [id number] Goal  CJ= 20-39%    The severity rating is based on clinical judgment and the Lower Extremity Funtional Score (LEFS) score.    Certification Period: 02/01/2013 to 03/01/2013   Lillie Columbia, PT 02/01/2013 9:04 AM  _____________________________________________________________________  I certify that the above Therapy Services are being furnished while the patient is under my care. I agree with the treatment plan and certify that this therapy is necessary.    Physician's Signature:____________________  Date:__________Time:______    Please sign and return to   [facility]  [address] [place], [place], [place] [address]  Phone: [phone]   Fax: [phone]

## 2013-02-01 NOTE — Progress Notes (Signed)
PT DAILY TREATMENT NOTE - MCR 8-14    Patient Name: Jose Stafford  Date:02/01/2013  DOB: [birth year]  [x]   Patient DOB Verified  Payor: [place] MED[employer]  Plan: [place] MEDICARE PART A & B  Product Type: Medicare     In time:9:00  Out time:9:45  Total Treatment Time (min): 45  Total Timed Codes (min): 8  1:1 Treatment Time (MC only): 45   Visit #: 1 of 9    Treatment Area: Pain in joint, pelvic region and thigh [719.45]    SUBJECTIVE  Pain Level (0-10 scale): 2-3/10  Any medication changes, allergies to medications, adverse drug reactions, diagnosis change, or new procedure performed?: [x]  No    []  Yes (see summary sheet for update)  Subjective functional status/changes:   []  No changes reported  See eval    OBJECTIVE    Modality rationale: increase tissue extensibility to improve the patient???s ability to stand   Min Type Additional Details    []  Estim: [] Att   [] Unatt        [] TENS instruct                  [] IFC  [] Premod   [] NMES                     [] Other:  [] w/US   [] w/ice   [] w/heat  Position:  Location:    []   Traction: []  Cervical       [] Lumbar                       []  Prone          [] Supine                       [] Intermittent   [] Continuous Lbs:  []  before manual  []  after manual    []   Ultrasound: [] Continuous   []  Pulsed                           []   [] Location:  W/cm2:    []   Iontophoresis with dexamethasone         Location: []  Take home patch   []  In clinic   5 []   Ice     [x]   heat  []   Ice massage Position: sidelying  Location: L IT band    []   Laser  []   Other: Position:  Location:    []   Vasopneumatic Device Pressure:       []  lo []  med []  hi   Temperature: []  lo []  med []  hi   []  Skin assessment post-treatment:  [] intact [] redness- no adverse reaction    [] redness ??? adverse reaction:      min Therapeutic Exercise:  []  See flow sheet :   Rationale:      min Therapeutic Activity:  []   See flow sheet :   Rationale:       min Neuromuscular Re-education:  []   See flow sheet :   Rationale:     8 min  Manual Therapy:  L IT band rolling, STM and trigger point release   Rationale: decrease pain and increase tissue extensibility to walk     min Gait Training:  ___ feet with ___ device on level surfaces with ___ level of assist   Rationale:          With   []   TE   []  TA   []  neuro   []  other: Patient Education: []  Review HEP    []  Progressed/Changed HEP based on:   []  positioning   []  body mechanics   []  transfers   []  heat/ice application    []  other:      Other Objective/Functional Measures: see eval     Pain Level (0-10 scale) post treatment: 1/10  The pt had reduced pain after manual work although he did have tenderness over one trigger point.     [x]   See Plan of Care  []   See progress note/recertification  []   See Discharge Summary         PLAN  []   Upgrade activities as tolerated     [x]   Continue plan of care  []   Update interventions per flow sheet       []   Discharge due to:_  []   Other:_      Lillie Columbia, PT 02/01/2013  9:55 AM

## 2013-02-12 NOTE — Progress Notes (Signed)
PT DAILY TREATMENT NOTE - MCR 8-14    Patient Name: Jose Stafford  Date:02/12/2013  DOB: 22-Oct-1927  [x]   Patient DOB Verified  Payor: [employer] MEDICARE  Plan: [place] MEDICARE PART A & B  Product Type: Medicare     In time:10:25  Out time:11:10  Total Treatment Time (min): 45  Total Timed Codes (min): 45  1:1 Treatment Time (MC only): 45   Visit #: 2 of 9    Treatment Area: Pain in joint, pelvic region and thigh [719.45]    SUBJECTIVE  Pain Level (0-10 scale): 2/10  Any medication changes, allergies to medications, adverse drug reactions, diagnosis change, or new procedure performed?: [x]  No    []  Yes (see summary sheet for update)  Subjective functional status/changes:   []  No changes reported  The pt reports that he has been taking lyrica for his neuropathy and he thinks it is helping with his hip pain as well.    OBJECTIVE    Modality rationale:    Min Type Additional Details    []  Estim: [] Att   [] Unatt        [] TENS instruct                  [] IFC  [] Premod   [] NMES                     [] Other:  [] w/US   [] w/ice   [] w/heat  Position:  Location:    []   Traction: []  Cervical       [] Lumbar                       []  Prone          [] Supine                       [] Intermittent   [] Continuous Lbs:  []  before manual  []  after manual    []   Ultrasound: [] Continuous   []  Pulsed                           []   [] Location:  W/cm2:    []   Iontophoresis with dexamethasone         Location: []  Take home patch   []  In clinic    []   Ice     []   heat  []   Ice massage Position:  Location:    []   Laser  []   Other: Position:  Location:    []   Vasopneumatic Device Pressure:       []  lo []  med []  hi   Temperature: []  lo []  med []  hi   []  Skin assessment post-treatment:  [] intact [] redness- no adverse reaction    [] redness ??? adverse reaction:     35 min Therapeutic Exercise:  [x]  See flow sheet :   Rationale: increase strength to improve the patient???s ability to walk     min Therapeutic Activity:  []   See flow sheet :   Rationale:       min Neuromuscular Re-education:  []   See flow sheet :   Rationale:     10 min Manual Therapy:  L ITB rolling and trigger point release   Rationale: decrease pain, increase tissue extensibility and decrease trigger points to improve his tolerance to standing     min Gait Training:  ___ feet with ___ device on level surfaces  with ___ level of assist   Rationale:          With   []  TE   []  TA   []  neuro   []  other: Patient Education: [x]  Review HEP    []  Progressed/Changed HEP based on:   []  positioning   []  body mechanics   []  transfers   []  heat/ice application    []  other:      Other Objective/Functional Measures: see goal review     Pain Level (0-10 scale) post treatment: 0/10  The pt had some L hip discomfort when he was performing standing R hip abduction but overall felt better after his session.     ASSESSMENT/Changes in Function: The pt continues to have some muscle restrictions and hip weakness which relates to his hip and bursa irritation. He would benefit from continued therapy to increase hip strength and stability so that he is able to stand and walk for prolonged periods of time without pain.    Patient will continue to benefit from skilled PT services to modify and progress therapeutic interventions, address functional mobility deficits, address strength deficits, analyze and address soft tissue restrictions, analyze and cue movement patterns, analyze and modify body mechanics/ergonomics and instruct in home and community integration to attain remaining goals.     [x]   See Plan of Care  []   See progress note/recertification  []   See Discharge Summary         Progress towards goals / Updated goals:  Short Term Goals: To be accomplished in 4 treatments:   1) L standing glute max strength will increase to 5/5.   Not met    Long Term Goals: To be accomplished in 8 treatments:   1) The pt will be able to walk for at least 15 minutes without increased pain so he can complete shopping tasks.   Progressing, pt  hasn't had any issues with walking lately but believes it is due to the medicine he is taking for his neuropathy  2) The pt will be able to stand for 20 minutes without increased pain for cooking and cleaning tasks.   Not met, pt can only stand for about 10 minutes  3) L abduction and glute med strength will increase to 5/5 to provide stability for standing and walking.  Not met, L abduction strength is 4+/5 and glute med is 4/5    PLAN  [x]   Upgrade activities as tolerated     [x]   Continue plan of care  []   Update interventions per flow sheet       []   Discharge due to:_  []   Other:_      Lillie Columbia, PT 02/12/2013  10:28 AM

## 2013-02-14 NOTE — Progress Notes (Signed)
PT DAILY TREATMENT NOTE - MCR 8-14    Patient Name: Jose Stafford  Date:02/14/2013  DOB: June 30, 1927  [x]   Patient DOB Verified  Payor: [employer] MEDICARE  Plan: [place] MEDICARE PART A & B  Product Type: Medicare     In time:10:00  Out time:10:43  Total Treatment Time (min): 43  Total Timed Codes (min): 40  1:1 Treatment Time (MC only): 38   Visit #: 3 of 9    Treatment Area: Pain in joint, pelvic region and thigh [719.45]    SUBJECTIVE  Pain Level (0-10 scale): 1/10  Any medication changes, allergies to medications, adverse drug reactions, diagnosis change, or new procedure performed?: [x]  No    []  Yes (see summary sheet for update)  Subjective functional status/changes:   []  No changes reported  The pt reports that he is barely having any pain today.    OBJECTIVE    Modality rationale:    Min Type Additional Details    []  Estim: [] Att   [] Unatt        [] TENS instruct                  [] IFC  [] Premod   [] NMES                     [] Other:  [] w/US   [] w/ice   [] w/heat  Position:  Location:    []   Traction: []  Cervical       [] Lumbar                       []  Prone          [] Supine                       [] Intermittent   [] Continuous Lbs:  []  before manual  []  after manual    []   Ultrasound: [] Continuous   []  Pulsed                           []   [] Location:  W/cm2:    []   Iontophoresis with dexamethasone         Location: []  Take home patch   []  In clinic    []   Ice     []   heat  []   Ice massage Position:  Location:    []   Laser  []   Other: Position:  Location:    []   Vasopneumatic Device Pressure:       []  lo []  med []  hi   Temperature: []  lo []  med []  hi   []  Skin assessment post-treatment:  [] intact [] redness- no adverse reaction    [] redness ??? adverse reaction:     32 min Therapeutic Exercise:  []  See flow sheet :   Rationale: increase ROM and increase strength to improve the patient???s ability to stand and walk     min Therapeutic Activity:  []   See flow sheet :   Rationale:      min Neuromuscular Re-education:  []    See flow sheet :   Rationale:      min Manual Therapy:     Rationale:      min Gait Training:  ___ feet with ___ device on level surfaces with ___ level of assist   Rationale:          With   []  TE   []  TA   []   neuro   []  other: Patient Education: [x]  Review HEP    []  Progressed/Changed HEP based on:   []  positioning   []  body mechanics   []  transfers   []  heat/ice application    []  other:      Other Objective/Functional Measures:      Pain Level (0-10 scale) post treatment: 0/10  The pt continues to respond well to exercises and manual and gets pain relief from it.    ASSESSMENT/Changes in Function: The pt would benefit from continued therapy to improve his strength and reduce muscle restrictions to improve his ability to stand walk and sleep without limitations due to pain.    Patient will continue to benefit from skilled PT services to modify and progress therapeutic interventions, address functional mobility deficits, address ROM deficits, address strength deficits, analyze and address soft tissue restrictions, analyze and cue movement patterns and instruct in home and community integration to attain remaining goals.     [x]   See Plan of Care  []   See progress note/recertification  []   See Discharge Summary         PLAN  [x]   Upgrade activities as tolerated     [x]   Continue plan of care  []   Update interventions per flow sheet       []   Discharge due to:_  []   Other:_      Lillie Columbia, PT 02/14/2013  11:28 AM

## 2013-02-19 NOTE — Progress Notes (Signed)
PT DAILY TREATMENT NOTE - MCR 8-14    Patient Name: Jose Stafford  Date:02/19/2013  DOB: 10-Jul-1927  [x]   Patient DOB Verified  Payor: Monia Pouch  Plan: [employer] PPO  Product Type: PPO     In time:9:55  Out time:10:40  Total Treatment Time (min): 45  Total Timed Codes (min): 45  1:1 Treatment Time (MC only): 40   Visit #: 4 of 9    Treatment Area: Pain in joint, pelvic region and thigh [719.45]    SUBJECTIVE  Pain Level (0-10 scale): 2/10  Any medication changes, allergies to medications, adverse drug reactions, diagnosis change, or new procedure performed?: [x]  No    []  Yes (see summary sheet for update)  Subjective functional status/changes:   []  No changes reported  The pt reports that he had an EMG done yesterday so he is more tired and sore today.    OBJECTIVE    Modality rationale:    Min Type Additional Details    []  Estim: [] Att   [] Unatt        [] TENS instruct                  [] IFC  [] Premod   [] NMES                     [] Other:  [] w/US   [] w/ice   [] w/heat  Position:  Location:    []   Traction: []  Cervical       [] Lumbar                       []  Prone          [] Supine                       [] Intermittent   [] Continuous Lbs:  []  before manual  []  after manual    []   Ultrasound: [] Continuous   []  Pulsed                           []   [] Location:  W/cm2:    []   Iontophoresis with dexamethasone         Location: []  Take home patch   []  In clinic    []   Ice     []   heat  []   Ice massage Position:  Location:    []   Laser  []   Other: Position:  Location:    []   Vasopneumatic Device Pressure:       []  lo []  med []  hi   Temperature: []  lo []  med []  hi   []  Skin assessment post-treatment:  [] intact [] redness- no adverse reaction    [] redness ??? adverse reaction:     37 min Therapeutic Exercise:  [x]  See flow sheet :   Rationale: increase ROM and increase strength to improve the patient???s ability to walk     min Therapeutic Activity:  []   See flow sheet :   Rationale:       min Neuromuscular Re-education:  []    See flow sheet :   Rationale:     8 min Manual Therapy:  IT band rolling and STM   Rationale: decrease pain, increase tissue extensibility and decrease trigger points to improve the pt's tolerance to standing     min Gait Training:  ___ feet with ___ device on level surfaces with ___ level of assist  Rationale:          With   []  TE   []  TA   []  neuro   []  other: Patient Education: [x]  Review HEP    []  Progressed/Changed HEP based on:   []  positioning   []  body mechanics   []  transfers   []  heat/ice application    []  other:      Other Objective/Functional Measures: see goal review     Pain Level (0-10 scale) post treatment: 0/10  The pt didn't have any increased pain with exercises and felt better after manual.    ASSESSMENT/Changes in Function: The pt would benefit from continued therapy to further increase his hip strength and reduce muscle and tissue restrictions contributing to irritation so that he is able to stand and walk without limitations from pain.    Patient will continue to benefit from skilled PT services to modify and progress therapeutic interventions, address functional mobility deficits, address strength deficits, analyze and address soft tissue restrictions, analyze and cue movement patterns, analyze and modify body mechanics/ergonomics and instruct in home and community integration to attain remaining goals.     [x]   See Plan of Care  []   See progress note/recertification  []   See Discharge Summary         Progress towards goals / Updated goals:  Short Term Goals: To be accomplished in 4 treatments:   1) L standing glute max strength will increase to 5/5.   Met    Long Term Goals: To be accomplished in 8 treatments:   1) The pt will be able to walk for at least 15 minutes without increased pain so he can complete shopping tasks.   Progressing, pt can walk for a few minutes but then starts to have increased pain, but he states it is getting better  2) The pt will be able to stand for 20 minutes  without increased pain for cooking and cleaning tasks.   Not met, pt can only stand for about 10 minutes   3) L abduction and glute med strength will increase to 5/5 to provide stability for standing and walking.   Progressing, L abduction strength is 4+/5 and glute med is 4+/5    PLAN  [x]   Upgrade activities as tolerated     [x]   Continue plan of care  []   Update interventions per flow sheet       []   Discharge due to:_  []   Other:_      Lillie Columbia, PT 02/19/2013  10:05 AM

## 2013-02-21 NOTE — Progress Notes (Signed)
PT DAILY TREATMENT NOTE - MCR 8-14    Patient Name: Jose Stafford  Date:02/21/2013  DOB: Jun 17, 1927  [x]   Patient DOB Verified  Payor: Monia Pouch  Plan: [employer] P[employer]  Product Type: PPO     In time:10:00  Out time:10:44  Total Treatment Time (min): 44  Total Timed Codes (min): 42  1:1 Treatment Time (MC only): 42   Visit #: 5 of 9    Treatment Area: Pain in joint, pelvic region and thigh [719.45]    SUBJECTIVE  Pain Level (0-10 scale): 3/10  Any medication changes, allergies to medications, adverse drug reactions, diagnosis change, or new procedure performed?: [x]  No    []  Yes (see summary sheet for update)  Subjective functional status/changes:   []  No changes reported  The pt reports that he had some pain when he came in but once he did the stepper it went away.    OBJECTIVE    Modality rationale:    Min Type Additional Details    []  Estim: [] Att   [] Unatt        [] TENS instruct                  [] IFC  [] Premod   [] NMES                     [] Other:  [] w/US   [] w/ice   [] w/heat  Position:  Location:    []   Traction: []  Cervical       [] Lumbar                       []  Prone          [] Supine                       [] Intermittent   [] Continuous Lbs:  []  before manual  []  after manual    []   Ultrasound: [] Continuous   []  Pulsed                           []   [] Location:  W/cm2:    []   Iontophoresis with dexamethasone         Location: []  Take home patch   []  In clinic    []   Ice     []   heat  []   Ice massage Position:  Location:    []   Laser  []   Other: Position:  Location:    []   Vasopneumatic Device Pressure:       []  lo []  med []  hi   Temperature: []  lo []  med []  hi   []  Skin assessment post-treatment:  [] intact [] redness- no adverse reaction    [] redness ??? adverse reaction:     34 min Therapeutic Exercise:  [x]  See flow sheet :   Rationale: increase strength to improve the patient???s ability to walk     min Therapeutic Activity:  []   See flow sheet :   Rationale:       min Neuromuscular Re-education:  []   See  flow sheet :   Rationale:     8 min Manual Therapy:  STM and rolling to L ITB   Rationale: decrease pain, increase tissue extensibility and decrease trigger points to stand     min Gait Training:  ___ feet with ___ device on level surfaces with ___ level of assist   Rationale:  With   []  TE   []  TA   []  neuro   []  other: Patient Education: [x]  Review HEP    []  Progressed/Changed HEP based on:   []  positioning   []  body mechanics   []  transfers   []  heat/ice application    []  other:      Other Objective/Functional Measures:      Pain Level (0-10 scale) post treatment: 0/10  The pt continues to respond well to exercises and get pain relief from them.    ASSESSMENT/Changes in Function: The pt would benefit from continued therapy to progress exercises so that he is able to tolerate standing and walking for more functional lengths of time without increased pain.    Patient will continue to benefit from skilled PT services to modify and progress therapeutic interventions, address functional mobility deficits, address strength deficits, analyze and address soft tissue restrictions, analyze and cue movement patterns, analyze and modify body mechanics/ergonomics and instruct in home and community integration to attain remaining goals.     [x]   See Plan of Care  []   See progress note/recertification  []   See Discharge Summary         PLAN  [x]   Upgrade activities as tolerated     [x]   Continue plan of care  []   Update interventions per flow sheet       []   Discharge due to:_  []   Other:_      Lillie Columbia, PT 02/21/2013  11:17 AM

## 2013-02-26 NOTE — Progress Notes (Signed)
PT DAILY TREATMENT NOTE - MCR 8-14    Patient Name: Jose Stafford  Date:02/26/2013  DOB: [birth year]  [x]   Patient DOB Verified  Payor: Monia Pouch  Plan: [employer] P[employer]  Product Type: PPO     In time:1:30  Out time:2:15  Total Treatment Time (min): 45  Total Timed Codes (min): 42  1:1 Treatment Time (MC only): 25   Visit #: 6 of 9    Treatment Area: Pain in joint, pelvic region and thigh [719.45]    SUBJECTIVE  Pain Level (0-10 scale): 0/10  Any medication changes, allergies to medications, adverse drug reactions, diagnosis change, or new procedure performed?: [x]  No    []  Yes (see summary sheet for update)  Subjective functional status/changes:   []  No changes reported  The pt reports that he doesn't have any pain this afternoon.    OBJECTIVE    Modality rationale:    Min Type Additional Details    []  Estim: [] Att   [] Unatt        [] TENS instruct                  [] IFC  [] Premod   [] NMES                     [] Other:  [] w/US   [] w/ice   [] w/heat  Position:  Location:    []   Traction: []  Cervical       [] Lumbar                       []  Prone          [] Supine                       [] Intermittent   [] Continuous Lbs:  []  before manual  []  after manual    []   Ultrasound: [] Continuous   []  Pulsed                           []   [] Location:  W/cm2:    []   Iontophoresis with dexamethasone         Location: []  Take home patch   []  In clinic    []   Ice     []   heat  []   Ice massage Position:  Location:    []   Laser  []   Other: Position:  Location:    []   Vasopneumatic Device Pressure:       []  lo []  med []  hi   Temperature: []  lo []  med []  hi   []  Skin assessment post-treatment:  [] intact [] redness- no adverse reaction    [] redness ??? adverse reaction:     37 min Therapeutic Exercise:  [x]  See flow sheet :   Rationale: increase strength to improve the patient???s ability to walk     min Therapeutic Activity:  []   See flow sheet :   Rationale:       min Neuromuscular Re-education:  []   See flow sheet :   Rationale:     8 min  Manual Therapy:  Rolling and STM to L ITB   Rationale: decrease pain, increase tissue extensibility and decrease trigger points to improve his ability to perform household tasks     min Gait Training:  ___ feet with ___ device on level surfaces with ___ level of assist   Rationale:  With   []  TE   []  TA   []  neuro   []  other: Patient Education: [x]  Review HEP    []  Progressed/Changed HEP based on:   []  positioning   []  body mechanics   []  transfers   []  heat/ice application    []  other:      Other Objective/Functional Measures: see goal review     Pain Level (0-10 scale) post treatment: 0/10  The pt didn't have any pain with exercises today.    ASSESSMENT/Changes in Function: The pt would benefit from continued therapy to further strengthen his hip and reduce IT band irritation so that he is able to perform all walking, standing and household chores without limitations from pain.    Patient will continue to benefit from skilled PT services to modify and progress therapeutic interventions, address functional mobility deficits, address strength deficits, analyze and address soft tissue restrictions, analyze and cue movement patterns, analyze and modify body mechanics/ergonomics and instruct in home and community integration to attain remaining goals.     []   See Plan of Care  []   See progress note/recertification  [x]   See Discharge Summary         Progress towards goals / Updated goals:  Short Term Goals: To be accomplished in 4 treatments:   1) L standing glute max strength will increase to 5/5.   Met     Long Term Goals: To be accomplished in 8 treatments:   1) The pt will be able to walk for at least 15 minutes without increased pain so he can complete shopping tasks.   Met, the pt reports that he has been able to walk for more then 20 minutes recently without pain  2) The pt will be able to stand for 20 minutes without increased pain for cooking and cleaning tasks.   Progressing, the pt has not had any  pain with standing recently but usually doesn't stand for any longer then 10 minutes   3) L abduction and glute med strength will increase to 5/5 to provide stability for standing and walking.   Progressing, L abduction strength is 4+/5 and glute med is 4+/5      PLAN  [x]   Upgrade activities as tolerated     [x]   Continue plan of care  []   Update interventions per flow sheet       []   Discharge due to:_  []   Other:_      Lillie Columbia, PT 02/26/2013  1:36 PM

## 2013-03-25 NOTE — Progress Notes (Signed)
[facility] [place], [place] [address]  Phone: [phone]   Fax: [phone]    Discharge Summary    Patient name: Jose Stafford     Start of Care: 02/01/2013  Referral source: Jacinto Halim, MD    DOB: [birth year]  Medical/Treatment Diagnosis: Pain in joint, pelvic region and thigh [719.45]  Onset Date:3-6 months prior to eval  Prior Hospitalization: see medical history   Provider#: [id number]  Comorbidities: arthritis, peripheral neuropathy, mitral valve prolapse, atrial fib, subdural hematoma, shoulder surgery x3, carpal tunnel surgery x2  Prior Level of Function:able to stand and walk without pain  Medications: Verified on Patient Summary List    Visits from Start of Care: 6    Missed Visits: 3  Reporting Period : 02/01/2013 to 02/26/2013    Short Term Goals: To be accomplished in 4 treatments:   1) L standing glute max strength will increase to 5/5.   Met     Long Term Goals: To be accomplished in 8 treatments:   1) The pt will be able to walk for at least 15 minutes without increased pain so he can complete shopping tasks.   Met, the pt reports that he has been able to walk for more then 20 minutes recently without pain   2) The pt will be able to stand for 20 minutes without increased pain for cooking and cleaning tasks.   Progressing, the pt has not had any pain with standing recently but usually doesn't stand for any longer then 10 minutes   3) L abduction and glute med strength will increase to 5/5 to provide stability for standing and walking.   Progressing, L abduction strength is 4+/5 and glute med is 4+/5    Assessment/ Summary of Care: The pt was making good progress and doing well with therapy but then his wife got ill and was in the hospital so he canceled his remaining appointments. He contacted the clinic recently and asked to be discharged because he is feeling better and he is busy caring for his wife. The above goal review was taken from his  last attended treatment on 02/26/2013.    RECOMMENDATIONS:  '[x]' Discontinue therapy: '[]' Patient has reached or is progressing toward set goals      '[x]' Patient is non-compliant or has abdicated      '[]' Due to lack of appreciable progress towards set goals    Lollie Marrow, PT 03/25/2013 3:47 PM

## 2013-06-12 NOTE — Progress Notes (Addendum)
Dena BilletHarold Mikels presents today for lab draw per Dr. Nicanor AlconLasater order.    Patient will be notified by the physician with lab results.    Free and Total and PSA obtained via venipuncture without any difficulty.      Orders Placed This Encounter   ??? PSA (TOTAL, REFLEX TO FREE)       Lonzo CloudAnn M Viviann Broyles on 06/12/2013       Results for orders placed in visit on 06/12/13   PSA (TOTAL, REFLEX TO FREE)       Result Value Ref Range    Prostate Specific Ag 2.78  0.00 - 4.00 ng/mL   PSA (FREE)       Result Value Ref Range    PSA, Free 0.903      % Free PSA 32

## 2013-06-13 LAB — PSA (TOTAL, REFLEX TO FREE): Prostate Specific Ag: 2.78 ng/mL (ref 0.00–4.00)

## 2013-06-14 LAB — PSA (FREE)
PSA, % Free: 32 %
PSA, Free: 0.903 ng/mL

## 2013-06-16 NOTE — Progress Notes (Signed)
Elevated PSA   020909 psa 1.9; 081009 psa 2.3; 020810 psa 1.6; 080910 psa 2.2; 030111 psa 3.1; 161096 psa 2.9; 045409 psa 2.23; 811914 psa 2.5; 782956 psa 2.2; 213086 psa 2.2;  578469 psa 2.4; 100714 psa 5.09,%free psa 19%; 102814 psa 2.3,%free psa 33.5%; 629528 psa 2.78 %free psa 32%;   10/04/2009 chart entry - pt has fu appt with me in September having been referred by Dr Burnadette Peter per his retirement; 80 yo with normal psa but abnormal psa velocity; has bph;   11/19/2009 office encounter - Preexam psa pending, pt to call if hasn't heard psa report by two weeks; exam shows 30 gm prostate, l>r wo nodules; reassured pt no sign of cancer; copy of psa to pcp planned;   Psa 2.9, reassuring letter to patient, copy of report to Dr Donette Larry   05/20/2010 medical assistant encounter - psa draw 2.23;  appt near future  05/27/2010 office encounter  - elev psa surveillance in pt with family hx of sig pca; psa range is 1.9 - 3.1, present 2.23; pt anxious about his psa and family hx wants six month checks; urine is micro benign; exam shows 30 gm prostate r>l wo nodules or induration; reassured pt no sign of prostate cancer but at risk due to age and family hx; reassured him pca death unlikely for him;   12/08/2010 medical assistant encounter - psa draw, reassuring stable value of 2.5; appt near future  12/06/2010 office encounter  - elevated psa hx in pt with strong fam hx of pca - brothers and his son and 3 nephews all with pca; reviewed his psa hx which is reassuring with stable value of 2.5; pt wo sxs; feels great - even though 31 he wishes active surveillance; exam shows 30 gm prostate r>l wo nodules or induration; will continue six month surveillance  06/06/2011 medical assistant encounter - psa 2.2, cw, let pt know his psa improved normal at 2.2; arrange appt with me in six months for " ua surveillance bph luts fam hx pca " - i recommend no psa testing; if he insists on psa testing, then get psa level two week prior;   12/08/2011  office encounter  - extensive fam hx of pca; here for fu exam, psa's have been normal recently so pt deferred psa testing; exam shows 30 gm prostate R>L  wo nodules; continue six months surveillance  psa was drawn for some reason, psa 2.2; no more psa's, letter to patient  06/18/2012 office encounter  - pt insists on continued psa checks in addition to prostate exam due to strong family hx of pca death; exam shows 30 gm prostate l>r wo nodules or induration; psa currently is 2.4, reviewed psa hx with im, recheck in six months;   12/25/2012 office encounter  - pt concerned about psa jump from 2.4 to 5; has sig fam hx pca; appetite good, weight stable, no unusual bone or joint pains; 30 gm prostate l>r wo nodules or induration; will recheck f/t psa in thee months, as long as stable, then fu exam in six months with f/t psa  01/08/2013 medical assistant encounter - pt not willing to wait three months for recheck psa, repeat psa 2.3 with reassuring %free psa 33.5%  01/14/2013 telephone encounter - debbie, call pt, im sure he already knows value but his repeat psa 2.3 and %free psa is normal 33.5%, i recommend no further psa testing but pt insists on psa testing; cancel planned appt in January for psa testing;  ask him when he wants his next psa done? Likely arrange nurse appt mid April for f/t psa and exam with me late April for " pvr/ua surveillance fam hx pca bph luts ibe (confirm f/t psa prior) "   06/12/2013 medical assistant encounter - psa normal 2.78 %free psa 32%; pt has an invalid appt for april 8th, this was missed on checkout april 1st and not rescheduled to a valid date and appt time; [name], please make sure this gets changed on Monday apirl 6th so pt doesn't come to office expecting to be seen on Wednesday        BPH w LUTS   Early 2000's microwave treatment - successful   [id number] Burnadette Peter) notes This is a follow-up visit for [name] who has an established diagnosis of BPH with obstruction. There has been  improvement of his symptoms. He reports nocturia one time a night. His current medications includes no medications. He has not experienced Problems when on these medications. Since his last visit he notes less frequency less nocturia less urgency   [id number] Burnadette Peter) notes There has been improvement of his symptoms. He reports nocturia is rare now. He is bothered by some daytime urgency, which has become more noticeable over the past 6 mos. His current medications includes no medications. Since his last visit he notes less frequency less nocturia But more daytime urgency; PLAN Sanctura trial   There has been further improvement of his symptoms. He reports nocturia is rare now, and not more than once nightly. He was bothered by some daytime urgency, but began Sanctura with good improvement.   [id number] Burnadette Peter) notes His current medications includes no BPH medications. He continues on Reunion, with good results.   [id number] Burnadette Peter) notes There has been improvement of his symptoms. He reports nocturia one or two times a night. He is emptying well, with a PVR of 310 cc today. He is reasonably happy with his voiding despite this. His current medications includes Sanctura 60 mgm qHS. He has not experienced side effects on this medication.   [id number] [name]) notes irritable sxs controlled with sanctura, notes 40 gm benign prostate   10/04/2009 chart entry - chart reviewed, note pt has rx for sudafed and is on sanctura with 40 gm prostate - will discuss impact of this rx on bph sxs and increased pvr;   11/19/2009 office encounter - bph surveillance, reviewed risk of sudafed with bph - uses rarely; remains on sanctura xr on empty stomach, pleased with bladder control; discussed trial of cessation if he would like since doing; denies hematuria or dysuria; urine is dip and micro negative, exam shows 30 gm benign prostate l>r wo Nodules; ok to trial cesation of sanctura if he'd like   05/01/2010 chart entry ??? changed insurance needs  alternative for sanctura since not on his insurance approved list, meds listed include oxybutynin, oxybutynin er 5, 10, 15, enablex, gelnique, vesicare; will go with vesicare 10 mg #30 rfx11, sent to [facility] pharmacy  05/06/2010 chart entry ??? vesicare too expensive, pt wants generic, pt advised of short term memory loss associated with oxybutynin; Rx oxbutynin er 10 mg daily #30 rfx11  05/27/2010 office encounter  - pt opted to stay w vesicare and avoid side effects with the oxybutynin; his AUA symptom score (detailed in the documents flowsheet) is only 2 on vesicare; pt pleased; will titrate dose to every other and then every third; whatever minimum dosage is effective;   12/06/2010 office encounter  - weaned off  vesicare, his AUA symptom score (detailed in the documents flowsheet) is 7; denies hematuria or dysuria; ua dip and micro negative;   12/08/2011 office encounter  - bph surveillance, no meds; pt satisfied with voiding; his AUA symptom score (detailed in the documents flowsheet) is 4, denies hematuria or dysuria; ua dip and micro negative; occl urgency  06/18/2012 office encounter  - not on any meds right now, stream a little slower; his AUA symptom score (detailed in the documents flowsheet) is 9; denies hematuria or dysuria; ua dip and micro negative; exam shows 30 gm benign prostate wo nodules or induration; will check pvr; pt defers any meds for now; if beomes bothered will call and we will trial flomax;   12/25/2012 office encounter  - bph surveillance, mild bother, pt defers meds in spite of stop and start nature to stream;       IBE /Weak Stream  POST VOID RESIDUAL  10/22/2007  04/21/2008  10/20/2008    PVR  72ml  58cc  310   11/19/2009 office encounter - auass 4, pvr 45 cc, pt wo bother   05/27/2010 office encounter  - auass 2, under influence of vesicare  12/06/2010 office encounter  - auass 7, has stopped vesicare, pvr 10cc  12/08/2011 office encounter  - auass 4 wo bother, no meds  06/18/2012 office encounter  -  auass 9, stream a little slower; pvr 163 cc (initially 269, had pt double void and pvr 163 cc) will recheck pvr in a month;   12/25/2012 office encounter  - auass 13, not bothered, pvr 184 cc;       Family Hx of Prostate Cancer   11/19/2009 office encounter - reports 3 brothers with pca  12/06/2010 office encounter  - now his 78 yo son has been diagnosed with pca;  12/25/2012 office encounter  -  Sig fam hx of pca;

## 2013-06-16 NOTE — Addendum Note (Signed)
Addended by: Leone PayorLASATER, Shanika Levings D on: 06/16/2013 07:28 PM     Modules accepted: Level of Service

## 2013-07-09 LAB — AMB POC URINALYSIS DIP STICK AUTO W/ MICRO (MICRO RESULTS)
Bacteria (UA POC): NEGATIVE
Bilirubin (UA POC): NEGATIVE
Blood (UA POC): NEGATIVE
Glucose (UA POC): NEGATIVE
Ketones (UA POC): NEGATIVE
Leukocyte esterase (UA POC): NEGATIVE
Nitrites (UA POC): NEGATIVE
Protein (UA POC): NEGATIVE mg/dL
RBCs (UA POC): NEGATIVE
Specific gravity (UA POC): 1.015 (ref 1.001–1.035)
Urobilinogen (UA POC): 0.2 (ref 0.2–1)
WBCs (UA POC): NEGATIVE
pH (UA POC): 6 (ref 4.6–8.0)

## 2013-07-09 LAB — AMB POC PVR, MEAS,POST-VOID RES,US,NON-IMAGING: PVR: 98 cc

## 2013-07-09 NOTE — Progress Notes (Signed)
Jose Heys, MD  [facility]  [address]  [place], [place] [address]  [phone]    Patient:  Jose Stafford  DOB:  [birth year]  Date of Service:  07/09/2013    ASSESSMENT/PLAN    Encounter Diagnoses   Name Primary?   ??? Elevated prostate specific antigen (PSA) Yes   ??? BPH w urinary obs/LUTS    ??? Urgency of urination    ??? Incomplete bladder emptying    ??? Family hx of prostate cancer        Assessment:  Sig fam hx of pca and pca death; has normal psa, normal %free psa and bph w mild luts he tolerates  Plan:  Pt insists on six month surveillance with f/t psa and exam     HISTORY OF PRESENT ILLNESS  He is currently 80 y.o. and his primary care provider is Jose Cage, MD.    Chief Complaint   Patient presents with   ??? Benign Prostatic Hypertrophy       Elevated PSA   020909 psa 1.9; 081009 psa 2.3; 020810 psa 1.6; 914782 psa 2.2; 956213 psa 3.1; 086578 psa 2.9; 469629 psa 2.23; 528413 psa 2.5; 244010 psa 2.2; 272536 psa 2.2;  644034 psa 2.4; 100714 psa 5.09,%free psa 19%; 102814 psa 2.3,%free psa 33.5%; 742595 psa 2.78 %free psa 32%;   10/04/2009 chart entry - pt has fu appt with me in September having been referred by Dr Jose Stafford per his retirement; 80 yo with normal psa but abnormal psa velocity; has bph;   11/19/2009 office encounter - Preexam psa pending, pt to call if hasn't heard psa report by two weeks; exam shows 30 gm prostate, l>r wo nodules; reassured pt no sign of cancer; copy of psa to pcp planned;   Psa 2.9, reassuring letter to patient, copy of report to Dr Jose Stafford   05/20/2010 medical assistant encounter - psa draw 2.23;  appt near future  05/27/2010 office encounter  - elev psa surveillance in pt with family hx of sig pca; psa range is 1.9 - 3.1, present 2.23; pt anxious about his psa and family hx wants six month checks; urine is micro benign; exam shows 30 gm prostate r>l wo nodules or induration; reassured pt no sign of prostate cancer but at risk due to age  and family hx; reassured him pca death unlikely for him;   Dec 12, 2010 medical assistant encounter - psa draw, reassuring stable value of 2.5; appt near future  12/06/2010 office encounter  - elevated psa hx in pt with strong fam hx of pca - brothers and his son and 3 nephews all with pca; reviewed his psa hx which is reassuring with stable value of 2.5; pt wo sxs; feels great - even though 65 he wishes active surveillance; exam shows 30 gm prostate r>l wo nodules or induration; will continue six month surveillance  06/06/2011 medical assistant encounter - psa 2.2, cw, let pt know his psa improved normal at 2.2; arrange appt with me in six months for " ua surveillance bph luts fam hx pca " - i recommend no psa testing; if he insists on psa testing, then get psa level two week prior;   12/08/2011 office encounter  - extensive fam hx of pca; here for fu exam, psa's have been normal recently so pt deferred psa testing; exam shows 30 gm prostate R>L  wo nodules; continue six months surveillance  psa was drawn for some reason, psa 2.2;  no more psa's, letter to patient  06/18/2012 office encounter  - pt insists on continued psa checks in addition to prostate exam due to strong family hx of pca death; exam shows 30 gm prostate l>r wo nodules or induration; psa currently is 2.4, reviewed psa hx with im, recheck in six months;   12/25/2012 office encounter  - pt concerned about psa jump from 2.4 to 5; has sig fam hx pca; appetite good, weight stable, no unusual bone or joint pains; 30 gm prostate l>r wo nodules or induration; will recheck f/t psa in thee months, as long as stable, then fu exam in six months with f/t psa  01/08/2013 medical assistant encounter - pt not willing to wait three months for recheck psa, repeat psa 2.3 with reassuring %free psa 33.5%  01/14/2013 telephone encounter - debbie, call pt, im sure he already knows value but his repeat psa 2.3 and %free psa is normal 33.5%, i recommend no further psa testing but  pt insists on psa testing; cancel planned appt in January for psa testing; ask him when he wants his next psa done? Likely arrange nurse appt mid April for f/t psa and exam with me late April for " pvr/ua surveillance fam hx pca bph luts ibe (confirm f/t psa prior) "   06/12/2013 medical assistant encounter - psa normal 2.78 %free psa 32%; pt has an invalid appt for april 8th, this was missed on checkout april 1st and not rescheduled to a valid date and appt time; [name], please make sure this gets changed on Monday apirl 6th so pt doesn't come to office expecting to be seen on Wednesday  07/09/2013 office encounter  - current psa 2.78, pt with fam hx of pca in two brothers, one son with pca, one died of pca; pt has high anxiety about pca; reviewed his psa hx in detail with him, asided from transient spike last october, psa have been very reassuringly normal and with reassuring %free psa; 3xam today shows 35 gm benign prostate wo nodules or induration; pt wants continued six month surveillance with f/t psa and exam;         BPH w LUTS   Early 2000's microwave treatment - successful   [id number] Jose Stafford) notes This is a follow-up visit for Jose Stafford who has an established diagnosis of BPH with obstruction. There has been improvement of his symptoms. He reports nocturia one time a night. His current medications includes no medications. He has not experienced Problems when on these medications. Since his last visit he notes less frequency less nocturia less urgency   [id number] Jose Stafford) notes There has been improvement of his symptoms. He reports nocturia is rare now. He is bothered by some daytime urgency, which has become more noticeable over the past 6 mos. His current medications includes no medications. Since his last visit he notes less frequency less nocturia But more daytime urgency; PLAN Sanctura trial   There has been further improvement of his symptoms. He reports nocturia is rare now, and not more than once nightly.  He was bothered by some daytime urgency, but began Sanctura with good improvement.   [id number] Jose Stafford) notes His current medications includes no BPH medications. He continues on Reunion, with good results.   [id number] Jose Stafford) notes There has been improvement of his symptoms. He reports nocturia one or two times a night. He is emptying well, with a PVR of 310 cc today. He is reasonably happy with his voiding despite this. His current  medications includes Sanctura 60 mgm qHS. He has not experienced side effects on this medication.   [id number] [name](Oscar) notes irritable sxs controlled with sanctura, notes 40 gm benign prostate   10/04/2009 chart entry - chart reviewed, note pt has rx for sudafed and is on sanctura with 40 gm prostate - will discuss impact of this rx on bph sxs and increased pvr;   11/19/2009 office encounter - bph surveillance, reviewed risk of sudafed with bph - uses rarely; remains on sanctura xr on empty stomach, pleased with bladder control; discussed trial of cessation if he would like since doing; denies hematuria or dysuria; urine is dip and micro negative, exam shows 30 gm benign prostate l>r wo Nodules; ok to trial cesation of sanctura if he'd like   05/01/2010 chart entry ??? changed insurance needs alternative for sanctura since not on his insurance approved list, meds listed include oxybutynin, oxybutynin er 5, 10, 15, enablex, gelnique, vesicare; will go with vesicare 10 mg #30 [id number], [facility]  05/06/2010 chart entry ??? vesicare too expensive, pt wants generic, pt advised of short term memory loss associated with oxybutynin; Rx oxbutynin er 10 mg daily #30 [id number]  05/27/2010 office encounter  - pt opted to stay w vesicare and avoid side effects with the oxybutynin; his AUA symptom score (detailed in the documents flowsheet) is only 2 on vesicare; pt pleased; will titrate dose to every other and then every third; whatever minimum dosage is effective;   12/06/2010 office encounter  - weaned off  vesicare, his AUA symptom score (detailed in the documents flowsheet) is 7; denies hematuria or dysuria; ua dip and micro negative;   12/08/2011 office encounter  - bph surveillance, no meds; pt satisfied with voiding; his AUA symptom score (detailed in the documents flowsheet) is 4, denies hematuria or dysuria; ua dip and micro negative; occl urgency  06/18/2012 office encounter  - not on any meds right now, stream a little slower; his AUA symptom score (detailed in the documents flowsheet) is 9; denies hematuria or dysuria; ua dip and micro negative; exam shows 30 gm benign prostate wo nodules or induration; will check pvr; pt defers any meds for now; if beomes bothered will call and we will trial flomax;   12/25/2012 office encounter  - bph surveillance, mild bother, pt defers meds in spite of stop and start nature to stream;   07/09/2013 office encounter  - bph surveillance, has enlarged prostate, tolerates his auass 12 ok; denies hematuria or dysuria;       IBE /Weak Stream  POST VOID RESIDUAL  10/22/2007  04/21/2008  10/20/2008    PVR  72ml  58cc  310   11/19/2009 office encounter - auass 4, pvr 45 cc, pt wo bother   05/27/2010 office encounter  - auass 2, under influence of vesicare  12/06/2010 office encounter  - auass 7, has stopped vesicare, pvr 10cc  12/08/2011 office encounter  - auass 4 wo bother, no meds  06/18/2012 office encounter  - auass 9, stream a little slower; pvr 163 cc (initially 269, had pt double void and pvr 163 cc) will recheck pvr in a month;   12/25/2012 office encounter  - auass 13, not bothered, pvr 184 cc;   07/09/2013 office encounter  - auass 12, pvr 98 cc, defers meds      Family Hx of Prostate Cancer   11/19/2009 office encounter - reports 3 brothers with pca  12/06/2010 office encounter  - now his 78  yo son has been diagnosed with pca;  12/25/2012 office encounter  -  Sig fam hx of pca;     PAST MEDICAL HISTORY    Past Surgical History   Procedure Laterality Date   ??? Hx orthopaedic     ??? Pr cardiac  surg procedure unlist         Past Medical History   Diagnosis Date   ??? Elevated PSA    ??? Family hx of prostate cancer    ??? Urgency of urination    ??? Benign localized hyperplasia of prostate with urinary obstruction and other lower urinary tract symptoms (LUTS)(600.21)    ??? Incomplete bladder emptying    ??? BPH without obstruction/lower urinary tract symptoms    ??? Urgency of urination      Current Outpatient Prescriptions   Medication Sig Dispense Refill   ??? loratadine (CLARITIN) 10 mg tablet Take 10 mg by mouth daily.       ??? gabapentin (NEURONTIN) 300 mg capsule        ??? OMEPRAZOLE (PRILOSEC PO) Take  by mouth.       ??? ipratropium (ATROVENT) 0.03 % nasal spray 2 Sprays every twelve (12) hours.       ??? acetaminophen (TYLENOL) 325 mg tablet Take  by mouth every four (4) hours as needed.       ??? sertraline (ZOLOFT) 25 mg tablet Take  by mouth daily.       ??? aspirin delayed-release 81 mg tablet Take  by mouth daily.         ??? GUAIFENESIN (MUCINEX PO) Take  by mouth.         ??? sertraline (ZOLOFT) 50 mg tablet     3   ??? oxycodone-acetaminophen (PERCOCET) 5-325 mg per tablet        ??? montelukast (SINGULAIR) 10 mg tablet Take 10 mg by mouth daily.         ??? Cetirizine (ZYRTEC) 10 mg Cap Take  by mouth.           Allergies   Allergen Reactions   ??? Milk Contact Dermatitis   ??? Other Plant, Animal, Environmental Other (comments)     Pollens, trees, dust causes chronic rhinitis and congestion   ??? Sulfa (Sulfonamide Antibiotics) Unknown (comments)     Family History   Problem Relation Age of Onset   ??? Cancer     ??? Hypertension         Social  History   Smoking status   ??? Never Smoker    Smokeless tobacco   ??? Never Used     History   Alcohol Use   ??? 0.5 oz/week   ??? 1 Glasses of wine per week        REVIEW OF SYSTEMS  Constitutional: Fever: No;  Skin: Rash: Yes;  HEENT: Hearing difficulty: No;  Eyes: Blurred vision: No;  Cardiovascular: Chest pain: No;  Respiratory: Shortness of breath: No;  Gastrointestinal: Nausea/vomiting: No;   Musculoskeletal: Back pain: No;  Neurological: Weakness: No;  Psychological: Memory loss: No;  Comments/additional findings:      PHYSICAL EXAM  BP 118/66   Ht 5\' 7"  (1.702 m)   Wt 157 lb (71.215 kg)   BMI 24.58 kg/m2   The BMI follow up plan is as follows: bmi ok    GENERAL APPEARANCE:  80 y.o. Caucasian  male alert, cooperative, no distress, appears stated age not obese;  ABDOMEN:  flat soft, non-tender. Bowel sounds normal.  No masses,  no organomegaly  BACK:  symmetric, no curvature. ROM normal. No CVA tenderness.  GENITOURINARY:    Penis:  normal, noncircumcised  Scrotum:  normal  Testes:  normal, no masses, size 20cc  Epididymides:  normal  Anus/Perineum:  within normal limits  Sphincter tone:  within normal limits  Rectum:  negative without mass, lesions or tenderness    Prostate:  35 gm benign prostate wo nodules or induration  Seminal vesicles:  nonpalpable  LYMPHATIC:  Cervical, supraclavicular, and axillary nodes normal.      GU DATA/REVIEW OF LABS AND IMAGING    AUA Assessment Score:  AUA Score: 12;    AUA Bother Rating: Mostly satisified    AUA Symptom Score 07/09/2013   Over the past month how often have you had the sensation that your bladder was not completely empty after you finished urinating? 2   Over the past month, how often have had to urinate again less than 2 hours after you last finished urinating? 1   Over the past month, how often have you found you stopped and started again several times when you urinated? 2   Over the past month, how often have you found it difficult to postpone urination? 2   Over the past month, how often have you had a weak urinary stream? 2   Over the past month, how often have you had to push or strain to begin urinating? 2   Over the past month, how many times did you most typically get up to urinate from the time you went to bed at night until the time you got up in the morning? 1   AUA Score 12   If you were to spend the rest of your life with your urinary  condition the way it is now, how would you feel about that? Mostly satisified       Results for orders placed in visit on 07/09/13   AMB POC PVR, MEAS,POST-VOID RES,US,NON-IMAGING       Result Value Ref Range    PVR 98     AMB POC URINALYSIS DIP STICK AUTO W/ MICRO (MICRO RESULTS)       Result Value Ref Range    Color (UA POC) Yellow      Clarity (UA POC) Clear      Glucose (UA POC) Negative  Negative    Bilirubin (UA POC) Negative  Negative    Ketones (UA POC) Negative  Negative    Specific gravity (UA POC) 1.015  1.001 - 1.035    Blood (UA POC) Negative  Negative    pH (UA POC) 6.0  4.6 - 8.0    Protein (UA POC) Negative  Negative mg/dL    Urobilinogen (UA POC) 0.2 mg/dL  0.2 - 1    Nitrites (UA POC) Negative  Negative    Leukocyte esterase (UA POC) Negative  Negative    Epithelial cells (UA POC)        WBCs (UA POC) n      RBCs (UA POC) n      Bacteria (UA POC) n      Crystals (UA POC)    Negative    Other (UA POC)           Other Lab Data Reviewed with patient:   YES    PSA    Copy to Jose CageHernani A Valerio, MD and other appropriate care team members eFaxed today.     Jose HeysJohn D Brandonn Capelli, MD  07/09/2013

## 2013-08-15 NOTE — ED Notes (Signed)
Patient armband removed and shreddedI have reviewed discharge instructions with the patient.  The patient verbalized understanding.

## 2013-08-15 NOTE — Progress Notes (Signed)
Kathlene November, ER Tech called to send patient for CT of Head

## 2013-08-15 NOTE — ED Provider Notes (Signed)
HPI Comments:   9:45 PM   Jose Stafford is a 80 y.o. male presenting to the ED C/O sharp headache and left-sided facial numbness onset 35-45 minutes ago. Pt reports he was watching TV when he noticed sxs. Took 2 extra strength Tylenol and baby aspirin for pain. Pt reports pain was initially about 8/10, is currently about 1/10. PMHx of subdural hematoma (Dr. Jiles Prows (neurosurgeon), 5-10 years ago). Pt is a social drinker. Pt denies smoking, visual changes, feeling unstable, changes in sound perception, numbness/tingling in arms and legs, nausea, vomiting, and any other Sx or complaints.      The history is provided by the patient. No language interpreter was used.    Written by Salina April, ED Scribe, as dictated by Placido Sou, MD        Past Medical History   Diagnosis Date   ??? Elevated PSA    ??? Family hx of prostate cancer    ??? Urgency of urination    ??? Benign localized hyperplasia of prostate with urinary obstruction and other lower urinary tract symptoms (LUTS)(600.21)    ??? Incomplete bladder emptying    ??? BPH without obstruction/lower urinary tract symptoms    ??? Urgency of urination         Past Surgical History   Procedure Laterality Date   ??? Hx orthopaedic     ??? Pr cardiac surg procedure unlist           Family History   Problem Relation Age of Onset   ??? Cancer     ??? Hypertension          History     Social History   ??? Marital Status: MARRIED     Spouse Name: N/A     Number of Children: N/A   ??? Years of Education: N/A     Occupational History   ??? Not on file.     Social History Main Topics   ??? Smoking status: Never Smoker    ??? Smokeless tobacco: Never Used   ??? Alcohol Use: 0.5 oz/week     1 Glasses of wine per week   ??? Drug Use: No   ??? Sexual Activity: Not on file     Other Topics Concern   ??? Not on file     Social History Narrative                  ALLERGIES: Milk; Other plant, animal, environmental; and Sulfa (sulfonamide antibiotics)      Review of Systems   Constitutional: Negative for fever,  chills, diaphoresis and unexpected weight change.   HENT: Negative for congestion, drooling, ear pain, rhinorrhea, sore throat, tinnitus and trouble swallowing.    Eyes: Negative for photophobia, pain, redness and visual disturbance.   Respiratory: Negative for cough, choking, chest tightness, shortness of breath, wheezing and stridor.    Cardiovascular: Negative for chest pain, palpitations and leg swelling.   Gastrointestinal: Negative for nausea, vomiting, abdominal pain, diarrhea, constipation, blood in stool, abdominal distention and anal bleeding.   Genitourinary: Negative for dysuria, urgency, frequency, hematuria, flank pain and difficulty urinating.   Musculoskeletal: Negative for back pain, arthralgias and neck pain.   Skin: Negative for color change, rash and wound.   Neurological: Positive for numbness (left-sided facial) and headaches. Negative for dizziness, seizures, syncope, speech difficulty and light-headedness.   Hematological: Does not bruise/bleed easily.   Psychiatric/Behavioral: Negative for suicidal ideas, hallucinations, behavioral problems, self-injury and agitation. The patient is not  hyperactive.    Written by Salina April, ED Scribe, as dictated by Placido Sou, MD        Filed Vitals:    08/15/13 2151   BP: 184/79   Pulse: 67   Temp: 98.5 ??F (36.9 ??C)   Resp: 22   Height: 5\' 7"  (1.702 m)   Weight: 71.215 kg (157 lb)   SpO2: 100%            Physical Exam   Constitutional: He is oriented to person, place, and time. He appears well-developed and well-nourished. No distress.   HENT:   Head: Normocephalic and atraumatic.   Right Ear: External ear normal.   Left Ear: External ear normal.   Mouth/Throat: Oropharynx is clear and moist. No oropharyngeal exudate.   Eyes: Conjunctivae and EOM are normal. Pupils are equal, round, and reactive to light. No scleral icterus.   No pallor   Neck: Normal range of motion. Neck supple. No JVD present. No tracheal deviation present. No thyromegaly present.    Cardiovascular: Normal rate, regular rhythm and normal heart sounds.    Mild 1/6 systolic murmur, heard best in the lower left parasternal margin.    Pulmonary/Chest: Effort normal and breath sounds normal. No stridor. No respiratory distress.   Abdominal: Soft. Bowel sounds are normal. He exhibits no distension. There is no tenderness. There is no rebound and no guarding.   Musculoskeletal: Normal range of motion. He exhibits no edema or tenderness.   No soft tissue injuries   Lymphadenopathy:     He has no cervical adenopathy.   Neurological: He is alert and oriented to person, place, and time. He has normal reflexes. No cranial nerve deficit. Coordination normal.   No pronator drift, no nystagmus. Normal fine motor coordination.   Skin: Skin is warm and dry. No rash noted. He is not diaphoretic. No erythema.   Psychiatric: He has a normal mood and affect. His behavior is normal. Judgment and thought content normal.   Nursing note and vitals reviewed.    RESULTS:    EKG interpretation: (Preliminary)  9:48 PM   NSR. Normal ECG. Vent.rate 67 bpm.   EKG read by Placido Sou, MD     CT HEAD WO CONT   Impression:   1. No evidence of acute intercranial process.   2. Mild atrophy.  As read by radiologist        Labs Reviewed   CARDIAC PANEL,(CK, CKMB & TROPONIN)   CBC WITH AUTOMATED DIFF   MAGNESIUM   PTT   PROTHROMBIN TIME + INR   METABOLIC PANEL, COMPREHENSIVE       Recent Results (from the past 12 hour(s))   EKG, 12 LEAD, INITIAL    Collection Time     08/15/13  9:48 PM       Result Value Ref Range    Ventricular Rate 67      Atrial Rate 67      P-R Interval 196      QRS Duration 98      Q-T Interval 396      QTC Calculation (Bezet) 418      Calculated P Axis 57      Calculated R Axis -26      Calculated T Axis 33      Diagnosis        Value: Normal sinus rhythm  Normal ECG  When compared with ECG of 22-Jan-2010 15:14,  No significant change was found  MDM  Number of Diagnoses or Management Options   Diagnosis management comments: DDx: headache, scalp infection, intracranial hemorrhage, brain tumor       Amount and/or Complexity of Data Reviewed  Clinical lab tests: ordered and reviewed  Tests in the radiology section of CPT??: ordered and reviewed (Head CT wo cont)  Tests in the medicine section of CPT??: ordered and reviewed (EKG)  Independent visualization of images, tracings, or specimens: yes (EKG)    MEDICATIONS GIVEN:  Medications - No data to display      Procedures    PROGRESS NOTE:   9:45 PM   Initial assessment completed.  Written by Salina AprilNick Doyle, ED Scribe, as dictated by Placido SouJeffrey Tori Dattilio, MD     DISCHARGE NOTE:  Jose SharkHarold Stafford's  results have been reviewed with him.  He has been counseled regarding his diagnosis, treatment, and plan.  He verbally conveys understanding and agreement of the signs, symptoms, diagnosis, treatment and prognosis and additionally agrees to follow up as discussed.  He also agrees with the care-plan and conveys that all of his questions have been answered.  I have also provided discharge instructions for him that include: educational information regarding their diagnosis and treatment, and list of reasons why they would want to return to the ED prior to their follow-up appointment, should his condition change.     CLINICAL IMPRESSION:    No diagnosis found.    AFTER VISIT PLAN:    Current Discharge Medication List      CONTINUE these medications which have NOT CHANGED    Details   loratadine (CLARITIN) 10 mg tablet Take 10 mg by mouth daily.      !! sertraline (ZOLOFT) 50 mg tablet Refills: 3      oxycodone-acetaminophen (PERCOCET) 5-325 mg per tablet       gabapentin (NEURONTIN) 300 mg capsule       OMEPRAZOLE (PRILOSEC PO) Take  by mouth.      ipratropium (ATROVENT) 0.03 % nasal spray 2 Sprays every twelve (12) hours.      acetaminophen (TYLENOL) 325 mg tablet Take  by mouth every four (4) hours as needed.      !! sertraline (ZOLOFT) 25 mg tablet Take  by mouth daily.       montelukast (SINGULAIR) 10 mg tablet Take 10 mg by mouth daily.        Cetirizine (ZYRTEC) 10 mg Cap Take  by mouth.        aspirin delayed-release 81 mg tablet Take  by mouth daily.        GUAIFENESIN (MUCINEX PO) Take  by mouth.         !! - Potential duplicate medications found. Please discuss with provider.           Follow-up Information    None           Written by Salina AprilNick Doyle, ED Scribe, as dictated by Placido SouJeffrey Brittany Osier, MD     I agree with the above documentation as written by the scribe.    Nada LibmanJeffrey D Talya Quain, MD

## 2013-08-15 NOTE — ED Notes (Signed)
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 Jiles Prows and pt didn't feel comfortable going to bed tonight without getting checked out; pt drove himself here "[facility] Pt is alert oriented, states pain down to 1/10 slight across from left to right of scalp, very minimal near non existent left facial cheek numbness; pt denies cp/sob/weakness etc;

## 2013-08-16 LAB — CBC WITH AUTOMATED DIFF
ABS. BASOPHILS: 0.1 10*3/uL — ABNORMAL HIGH (ref 0.0–0.06)
ABS. EOSINOPHILS: 0.3 10*3/uL (ref 0.0–0.4)
ABS. LYMPHOCYTES: 2 10*3/uL (ref 0.9–3.6)
ABS. MONOCYTES: 0.7 10*3/uL (ref 0.05–1.2)
ABS. NEUTROPHILS: 3.2 10*3/uL (ref 1.8–8.0)
BASOPHILS: 1 % (ref 0–2)
EOSINOPHILS: 5 % (ref 0–5)
HCT: 39 % (ref 36.0–48.0)
HGB: 13 g/dL (ref 13.0–16.0)
LYMPHOCYTES: 32 % (ref 21–52)
MCH: 30.4 PG (ref 24.0–34.0)
MCHC: 33.3 g/dL (ref 31.0–37.0)
MCV: 91.3 FL (ref 74.0–97.0)
MONOCYTES: 12 % — ABNORMAL HIGH (ref 3–10)
MPV: 8.5 FL — ABNORMAL LOW (ref 9.2–11.8)
NEUTROPHILS: 50 % (ref 40–73)
PLATELET: 173 10*3/uL (ref 135–420)
RBC: 4.27 M/uL — ABNORMAL LOW (ref 4.70–5.50)
RDW: 13.2 % (ref 11.6–14.5)
WBC: 6.4 10*3/uL (ref 4.6–13.2)

## 2013-08-16 LAB — PTT: aPTT: 44.5 s — ABNORMAL HIGH (ref 24.6–37.7)

## 2013-08-16 LAB — METABOLIC PANEL, COMPREHENSIVE
A-G Ratio: 1.2 (ref 0.8–1.7)
ALT (SGPT): 16 U/L (ref 16–61)
AST (SGOT): 15 U/L (ref 15–37)
Albumin: 4 g/dL (ref 3.4–5.0)
Alk. phosphatase: 97 U/L (ref 45–117)
Anion gap: 7 mmol/L (ref 3.0–18)
BUN/Creatinine ratio: 16 (ref 12–20)
BUN: 21 MG/DL — ABNORMAL HIGH (ref 7.0–18)
Bilirubin, total: 0.5 MG/DL (ref 0.2–1.0)
CO2: 30 mmol/L (ref 21–32)
Calcium: 8.9 MG/DL (ref 8.5–10.1)
Chloride: 100 mmol/L (ref 100–108)
Creatinine: 1.28 MG/DL (ref 0.6–1.3)
GFR est AA: >60 mL/min/{1.73_m2} (ref >60)
GFR est non-AA: 57 mL/min/{1.73_m2} — ABNORMAL LOW (ref >60)
Globulin: 3.4 g/dL (ref 2.0–4.0)
Glucose: 101 mg/dL — ABNORMAL HIGH (ref 74–99)
Potassium: 4.1 mmol/L (ref 3.5–5.5)
Protein, total: 7.4 g/dL (ref 6.4–8.2)
Sodium: 137 mmol/L (ref 136–145)

## 2013-08-16 LAB — CARDIAC PANEL,(CK, CKMB & TROPONIN)
CK - MB: 1.7 ng/ml (ref 0.5–3.6)
CK-MB Index: 1.8 % (ref 0.0–4.0)
CK: 97 U/L (ref 39–308)
Troponin-I, QT: <0.02 NG/ML (ref 0.00–0.06)

## 2013-08-16 LAB — PROTHROMBIN TIME + INR
INR: 1 (ref 0.8–1.2)
Prothrombin time: 12.8 s (ref 11.5–15.2)

## 2013-08-16 LAB — MAGNESIUM: Magnesium: 1.9 mg/dL (ref 1.8–2.4)

## 2013-08-17 LAB — EKG, 12 LEAD, INITIAL
Atrial Rate: 67 {beats}/min
Calculated P Axis: 57 degrees
Calculated R Axis: -26 degrees
Calculated T Axis: 33 degrees
P-R Interval: 196 ms
Q-T Interval: 396 ms
QRS Duration: 98 ms
QTC Calculation (Bezet): 418 ms
Ventricular Rate: 67 {beats}/min

## 2013-09-27 LAB — AMB POC URINALYSIS DIP STICK AUTO W/O MICRO
Bilirubin (UA POC): NEGATIVE
Blood (UA POC): NEGATIVE
Glucose (UA POC): NEGATIVE
Ketones (UA POC): NEGATIVE
Leukocyte esterase (UA POC): NEGATIVE
Nitrites (UA POC): NEGATIVE
Protein (UA POC): NEGATIVE mg/dL
Specific gravity (UA POC): 1.01 (ref 1.001–1.035)
Urobilinogen (UA POC): 0.2 (ref 0.2–1)
pH (UA POC): 6 (ref 4.6–8.0)

## 2013-09-27 LAB — PROSTATE SPECIFIC ANTIGEN, TOTAL (PSA): Prostate Specific Ag: 4.44 ng/mL — ABNORMAL HIGH (ref 0.00–4.00)

## 2013-09-27 LAB — AMB POC PVR, MEAS,POST-VOID RES,US,NON-IMAGING: PVR: 80 cc

## 2013-09-27 NOTE — Progress Notes (Addendum)
Rollyn Scialdone is here today per the request of Dr. Nicanor Alcon.     He is here to give a urine specimen.  Urine is obtained from patient via clean catch.       Urine was not sent for culture.      Patient denies urinary sxs     Results for orders placed or performed in visit on 09/27/13   PROSTATE SPECIFIC ANTIGEN, TOTAL (PSA)   Result Value Ref Range    Prostate Specific Ag 4.44 (H) 0.00 - 4.00 ng/mL   AMB POC PVR, MEAS,POST-VOID RES,US,NON-IMAGING   Result Value Ref Range    PVR 80 cc   AMB POC URINALYSIS DIP STICK AUTO W/O MICRO   Result Value Ref Range    Color (UA POC) Yellow     Clarity (UA POC) Clear     Glucose (UA POC) Negative Negative    Bilirubin (UA POC) Negative Negative    Ketones (UA POC) Negative Negative    Specific gravity (UA POC) 1.010 1.001 - 1.035    Blood (UA POC) Negative Negative    pH (UA POC) 6.0 4.6 - 8.0    Protein (UA POC) Negative Negative mg/dL    Urobilinogen (UA POC) 0.2 mg/dL 0.2 - 1    Nitrites (UA POC) Negative Negative    Leukocyte esterase (UA POC) Negative Negative        Orders Placed This Encounter   ??? PROSTATE SPECIFIC ANTIGEN, TOTAL (PSA)   ??? AMB POC PVR, MEAS,POST-VOID RES,US,NON-IMAGING   ??? AMB POC URINALYSIS DIP STICK AUTO W/O MICRO         Pete Schnitzer A Kendyl Festa on 09/27/2013

## 2013-09-27 NOTE — Progress Notes (Signed)
Dena BilletHarold Favaro presents today for lab draw per Dr. Nicanor AlconLasater order.    Patient will be notified by the physician with lab results.    PSA obtained via venipuncture without any difficulty.      Orders Placed This Encounter   ??? PROSTATE SPECIFIC ANTIGEN, TOTAL (PSA)   ??? AMB POC PVR, MEAS,POST-VOID RES,US,NON-IMAGING       Dhanya Bogle A Lennie Vasco on 09/27/2013

## 2013-09-28 NOTE — Addendum Note (Signed)
Addended by: Leone Payor D on: 09/28/2013 08:09 PM      Modules accepted: Level of Service

## 2013-09-28 NOTE — Progress Notes (Signed)
Elevated PSA   020909 psa 1.9; 081009 psa 2.3; 020810 psa 1.6; 080910 psa 2.2; 030111 psa 3.1; 161096 psa 2.9; 045409 psa 2.23; 811914 psa 2.5; 782956 psa 2.2; 213086 psa 2.2;  578469 psa 2.4; 100714 psa 5.09,%free psa 19%; 102814 psa 2.3,%free psa 33.5%; 629528 psa 2.78 %free psa 32%; 413244 psa 4.44;     10/04/2009 chart entry - pt has fu appt with me in September having been referred by Dr Burnadette Peter per his retirement; 80 yo with normal psa but abnormal psa velocity; has bph;   11/19/2009 office encounter - Preexam psa pending, pt to call if hasn't heard psa report by two weeks; exam shows 30 gm prostate, l>r wo nodules; reassured pt no sign of cancer; copy of psa to pcp planned;   Psa 2.9, reassuring letter to patient, copy of report to Dr Donette Larry   05/20/2010 medical assistant encounter - psa draw 2.23;  appt near future  05/27/2010 office encounter  - elev psa surveillance in pt with family hx of sig pca; psa range is 1.9 - 3.1, present 2.23; pt anxious about his psa and family hx wants six month checks; urine is micro benign; exam shows 30 gm prostate r>l wo nodules or induration; reassured pt no sign of prostate cancer but at risk due to age and family hx; reassured him pca death unlikely for him;   12/07/2010 medical assistant encounter - psa draw, reassuring stable value of 2.5; appt near future  12/06/2010 office encounter  - elevated psa hx in pt with strong fam hx of pca - brothers and his son and 3 nephews all with pca; reviewed his psa hx which is reassuring with stable value of 2.5; pt wo sxs; feels great - even though 75 he wishes active surveillance; exam shows 30 gm prostate r>l wo nodules or induration; will continue six month surveillance  06/06/2011 medical assistant encounter - psa 2.2, cw, let pt know his psa improved normal at 2.2; arrange appt with me in six months for " ua surveillance bph luts fam hx pca " - i recommend no psa testing; if he  insists on psa testing, then get psa level two week prior;   12/08/2011 office encounter  - extensive fam hx of pca; here for fu exam, psa's have been normal recently so pt deferred psa testing; exam shows 30 gm prostate R>L  wo nodules; continue six months surveillance  psa was drawn for some reason, psa 2.2; no more psa's, letter to patient  06/18/2012 office encounter  - pt insists on continued psa checks in addition to prostate exam due to strong family hx of pca death; exam shows 30 gm prostate l>r wo nodules or induration; psa currently is 2.4, reviewed psa hx with im, recheck in six months;   12/25/2012 office encounter  - pt concerned about psa jump from 2.4 to 5; has sig fam hx pca; appetite good, weight stable, no unusual bone or joint pains; 30 gm prostate l>r wo nodules or induration; will recheck f/t psa in thee months, as long as stable, then fu exam in six months with f/t psa  01/08/2013 medical assistant encounter - pt not willing to wait three months for recheck psa, repeat psa 2.3 with reassuring %free psa 33.5%  01/14/2013 telephone encounter - debbie, call pt, im sure he already knows value but his repeat psa 2.3 and %free psa is normal 33.5%, i recommend no further psa testing but pt insists on psa testing; cancel planned appt  in January for psa testing; ask him when he wants his next psa done? Likely arrange nurse appt mid April for f/t psa and exam with me late April for " pvr/ua surveillance fam hx pca bph luts ibe (confirm f/t psa prior) "   06/12/2013 medical assistant encounter - psa normal 2.78 %free psa 32%; pt has an invalid appt for april 8th, this was missed on checkout april 1st and not rescheduled to a valid date and appt time; [name], please make sure this gets changed on Monday apirl 6th so pt doesn't come to office expecting to be seen on Wednesday  07/09/2013 office encounter  - current psa 2.78, pt with fam hx of pca in  two brothers, one son with pca, one died of pca; pt has high anxiety about pca; reviewed his psa hx in detail with him, asided from transient spike last october, psa have been very reassuringly normal and with reassuring %free psa; 3xam today shows 35 gm benign prostate wo nodules or induration; pt wants continued six month surveillance with f/t psa and exam;   09/27/2013 medical assistant encounter - psa increased to 4.44, spiked to 5.09 last fall so no particular worry here, pvr 80 cc, ua benign;    09/30/2013 telephone encounter - debbie, not sure why he came in for psa at this time - ask him reason when you call him; yes, his psa did bump up a bit to 4.44 but that is not concerning, had spike last fall to 5.09; he has appts for f/t psa in late september and fu exam mid october; also let him know his urine ok and pvr ok;         BPH w LUTS   Early 2000's microwave treatment - successful   [id number] Burnadette Peter) notes This is a follow-up visit for Jose Stafford who has an established diagnosis of BPH with obstruction. There has been improvement of his symptoms. He reports nocturia one time a night. His current medications includes no medications. He has not experienced Problems when on these medications. Since his last visit he notes less frequency less nocturia less urgency   [id number] Burnadette Peter) notes There has been improvement of his symptoms. He reports nocturia is rare now. He is bothered by some daytime urgency, which has become more noticeable over the past 6 mos. His current medications includes no medications. Since his last visit he notes less frequency less nocturia But more daytime urgency; PLAN Sanctura trial   There has been further improvement of his symptoms. He reports nocturia is rare now, and not more than once nightly. He was bothered by some daytime urgency, but began Sanctura with good improvement.   [id number] Burnadette Peter) notes His current medications includes no BPH medications.  He continues on Reunion, with good results.   [id number] Burnadette Peter) notes There has been improvement of his symptoms. He reports nocturia one or two times a night. He is emptying well, with a PVR of 310 cc today. He is reasonably happy with his voiding despite this. His current medications includes Sanctura 60 mgm qHS. He has not experienced side effects on this medication.   [id number] [name]) notes irritable sxs controlled with sanctura, notes 40 gm benign prostate   10/04/2009 chart entry - chart reviewed, note pt has rx for sudafed and is on sanctura with 40 gm prostate - will discuss impact of this rx on bph sxs and increased pvr;   11/19/2009 office encounter - bph surveillance, reviewed risk of sudafed with bph -  uses rarely; remains on sanctura xr on empty stomach, pleased with bladder control; discussed trial of cessation if he would like since doing; denies hematuria or dysuria; urine is dip and micro negative, exam shows 30 gm benign prostate l>r wo Nodules; ok to trial cesation of sanctura if he'd like   05/01/2010 chart entry ??? changed insurance needs alternative for sanctura since not on his insurance approved list, meds listed include oxybutynin, oxybutynin er 5, 10, 15, enablex, gelnique, vesicare; will go with vesicare 10 mg #30 rfx11, [facility]  05/06/2010 chart entry ??? vesicare too expensive, pt wants generic, pt advised of short term memory loss associated with oxybutynin; Rx oxbutynin er 10 mg daily #30 rfx11  05/27/2010 office encounter  - pt opted to stay w vesicare and avoid side effects with the oxybutynin; his AUA symptom score (detailed in the documents flowsheet) is only 2 on vesicare; pt pleased; will titrate dose to every other and then every third; whatever minimum dosage is effective;   12/06/2010 office encounter  - weaned off vesicare, his AUA symptom score (detailed in the documents flowsheet) is 7; denies hematuria or dysuria; ua dip and micro negative;    12/08/2011 office encounter  - bph surveillance, no meds; pt satisfied with voiding; his AUA symptom score (detailed in the documents flowsheet) is 4, denies hematuria or dysuria; ua dip and micro negative; occl urgency  06/18/2012 office encounter  - not on any meds right now, stream a little slower; his AUA symptom score (detailed in the documents flowsheet) is 9; denies hematuria or dysuria; ua dip and micro negative; exam shows 30 gm benign prostate wo nodules or induration; will check pvr; pt defers any meds for now; if beomes bothered will call and we will trial flomax;   12/25/2012 office encounter  - bph surveillance, mild bother, pt defers meds in spite of stop and start nature to stream;   07/09/2013 office encounter  - bph surveillance, has enlarged prostate, tolerates his auass 12 ok; denies hematuria or dysuria;   09/27/2013 medical assistant encounter - psa increased to 4.44, spiked to 5.09 last fall so no particular worry here, pvr 80 cc, ua benign;        IBE /Weak Stream  POST VOID RESIDUAL  10/22/2007  04/21/2008  10/20/2008    PVR  72ml  58cc  310   11/19/2009 office encounter - auass 4, pvr 45 cc, pt wo bother   05/27/2010 office encounter  - auass 2, under influence of vesicare  12/06/2010 office encounter  - auass 7, has stopped vesicare, pvr 10cc  12/08/2011 office encounter  - auass 4 wo bother, no meds  06/18/2012 office encounter  - auass 9, stream a little slower; pvr 163 cc (initially 269, had pt double void and pvr 163 cc) will recheck pvr in a month;   12/25/2012 office encounter  - auass 13, not bothered, pvr 184 cc;   07/09/2013 office encounter  - auass 12, pvr 98 cc, defers meds  09/27/2013 medical assistant encounter - pvr 80 cc      Family Hx of Prostate Cancer   11/19/2009 office encounter - reports 3 brothers with pca  12/06/2010 office encounter  - now his 78 yo son has been diagnosed with pca;  12/25/2012 office encounter  -  Sig fam hx of pca;

## 2013-09-30 NOTE — Progress Notes (Addendum)
Result to pt.  He is concerned he has prostate cancer.  Has family hx of " fast growing prostate cancer"  He feels he needs a bx.          From   [name]    To   [name], MD    Sent   09/25/2013 3:52 PM        My insurance company will cover another PSA if it is medically necessary. It must be designated as a diagnostic procedure, not a routine procedure. I will call for an appointment.             Previous Messages     ----- Message -----  From: Trey Sailors  Sent: 09/24/2013 5:41 PM EDT  To: [name]  Subject: RE: Non-Urgent Medical Question    Mr Humble,  I would like to get you into the office for a urine check and pvr. We can collect your blood prior to September 30. You will have to check with your insurance first to make sure they will cover it. Please call the desk so we can schedule you for a urine test as soon as possible.    ----- Message -----  FromDena Billet  Sent: 09/24/2013 2:29 PM EDT  To: [name], MD  Subject: Non-Urgent Medical Question    I am experiencing increased urinary problems over the last few months, and recently some pains in the right groin area and lower right back. I know that I might be a little overly concerned about my last PSA rise and my family history of Prostate Cancer, but would it be possible to have another PSA and checkup sooner than the one I have scheduled for September 30? Would my insurance cover that?            RE: Non-Urgent Medical Question    From   [name]    To   [name], MD    Sent   09/24/2013 10:50 PM       I will call for an appointment and I will call my insurance company tomorrow             Previous Messages     ----- Message -----  From: Trey Sailors  Sent: 09/24/2013 5:41 PM EDT  To: [name]  Subject: RE: Non-Urgent Medical Question    Mr Jacober,  I would like to get you into the office for a urine check and pvr. We can collect your blood prior to September 30. You will have to check with your  insurance first to make sure they will cover it. Please call the desk so we can schedule you for a urine test as soon as possible.    ----- Message -----  FromDena Billet  Sent: 09/24/2013 2:29 PM EDT  To: [name], MD  Subject: Non-Urgent Medical Question    I am experiencing increased urinary problems over the last few months, and recently some pains in the right groin area and lower right back. I know that I might be a little overly concerned about my last PSA rise and my family history of Prostate Cancer, but would it be possible to have another PSA and checkup sooner than the one I have scheduled for September 30? Would my insurance cover that?            RE: Non-Urgent Medical Question    From   Trey Sailors  To   Angelino Rumery    Sent and Delivered   09/24/2013 5:41 PM       Last Read in MyChart   09/28/2013 9:09 PM by [name]       Mr Cullipher,  I would like to get you into the office for a urine check and pvr. We can collect your blood prior to September 30. You will have to check with your insurance first to make sure they will cover it. Please call the desk so we can schedule you for a urine test as soon as possible.             Previous

## 2013-12-11 NOTE — Progress Notes (Addendum)
Dena BilletHarold Mcnamara presents today for lab draw per Dr. Nicanor AlconLasater order.    Results for orders placed or performed in visit on 12/11/13   PSA (TOTAL, REFLEX TO FREE)   Result Value Ref Range    Prostate Specific Ag 5.81 (H) 0.00 - 4.00 ng/mL   PSA (FREE)   Result Value Ref Range    PSA, Free 1.420 ng/mL    % Free PSA 24 %       Patient will be notified by the physician with lab results.    PSA F/T obtained via venipuncture without any difficulty.      Orders Placed This Encounter   ??? PSA (TOTAL, REFLEX TO FREE)   ??? PR COLLECTION VENOUS BLOOD,VENIPUNCTURE       Samson Ralph J Smt Lokey on 12/11/2013

## 2013-12-12 LAB — PSA (TOTAL, REFLEX TO FREE): Prostate Specific Ag: 5.81 ng/mL — ABNORMAL HIGH (ref 0.00–4.00)

## 2013-12-13 LAB — PSA (FREE)
PSA, % Free: 24 %
PSA, Free: 1.42 ng/mL

## 2013-12-23 ENCOUNTER — Ambulatory Visit: Admit: 2013-12-23 | Discharge: 2013-12-23 | Payer: MEDICARE | Attending: Urology | Primary: Family Medicine

## 2013-12-23 DIAGNOSIS — R972 Elevated prostate specific antigen [PSA]: Secondary | ICD-10-CM

## 2013-12-23 LAB — AMB POC URINALYSIS DIP STICK AUTO W/ MICRO (MICRO RESULTS)
Bacteria (UA POC): NEGATIVE
Bilirubin (UA POC): NEGATIVE
Blood (UA POC): NEGATIVE
Glucose (UA POC): NEGATIVE
Ketones (UA POC): NEGATIVE
Leukocyte esterase (UA POC): NEGATIVE
Nitrites (UA POC): NEGATIVE
Protein (UA POC): NEGATIVE mg/dL
RBCs (UA POC): NEGATIVE
Specific gravity (UA POC): 1.015 (ref 1.001–1.035)
Urobilinogen (UA POC): 0.2 (ref 0.2–1)
WBCs (UA POC): NEGATIVE
pH (UA POC): 6 (ref 4.6–8.0)

## 2013-12-23 LAB — AMB POC PVR, MEAS,POST-VOID RES,US,NON-IMAGING: PVR: 32 cc

## 2013-12-23 NOTE — Progress Notes (Signed)
[name], MD  [facility] [place]  [address]  [place], [place] [address]  [phone]    Patient:  Jose Stafford  DOB:  [birth year]  Date of Service:  12/23/2013    ASSESSMENT/PLAN    Encounter Diagnoses   Name Primary?   ??? Elevated prostate specific antigen (PSA) Yes   ??? Enlarged prostate with lower urinary tract symptoms (LUTS)    ??? Family hx of prostate cancer    ??? Urgency of urination    ??? Incomplete bladder emptying        Assessment:  Variable psa readings but general increase in psa over the past couple years, has favorable %free psa readings; has fam hx of pca death; pt accustomed to care by gu cancer specialists (shellhammer and lynch in past) and has finally reached his level of intolerable anxiety with my recommended ww/active surveillance at age 26 to request gu onc consultation regarding advisability of proceeding with nbxp at age 27 with psa appropriate for age but admittedly rate of change over the past year is somewhat concerning - I don't see it as threatening but pt very concerned and apprehensive;   Plan:  Consult gu oncology - Dr Given    HISTORY OF PRESENT ILLNESS  He is currently 80 y.o. and his primary care provider is [name], MD.    Chief Complaint   Patient presents with   ??? Elevated PSA       Elevated PSA/Family Hx of PCA death   May 08, 2007 psa 1.9; 081009 psa 2.3; 020810 psa 1.6; 080910 psa 2.2; 102725 psa 3.1; 366440 psa 2.9; 347425 psa 2.23; 956387 psa 2.5; 564332 psa 2.2; 951884 psa 2.2;  166063 psa 2.4; 100714 psa 5.09,%free psa 19%; 102814 psa 2.3,%free psa 33.5%; 016010 psa 2.78 %free psa 32%; 071715 psa 4.44; 932355 psa 5.81 %free psa 24%;      10/04/2009 chart entry - pt has fu appt with me in September having been referred by Dr Burnadette Peter per his retirement; 80 yo with normal psa but abnormal psa velocity; has bph;   11/19/2009 office encounter - Preexam psa pending, pt to call if hasn't  heard psa report by two weeks; exam shows 30 gm prostate, l>r wo nodules; reassured pt no sign of cancer; copy of psa to pcp planned;   Psa 2.9, reassuring letter to patient, copy of report to Dr Donette Larry   05/20/2010 medical assistant encounter - psa draw 2.23;  appt near future  05/27/2010 office encounter  - elev psa surveillance in pt with family hx of sig pca; psa range is 1.9 - 3.1, present 2.23; pt anxious about his psa and family hx wants six month checks; urine is micro benign; exam shows 30 gm prostate r>l wo nodules or induration; reassured pt no sign of prostate cancer but at risk due to age and family hx; reassured him pca death unlikely for him;   12-15-2010 medical assistant encounter - psa draw, reassuring stable value of 2.5; appt near future  12/06/2010 office encounter  - elevated psa hx in pt with strong fam hx of pca - brothers and his son and 3 nephews all with pca; reviewed his psa hx which is reassuring with stable value of 2.5; pt wo sxs; feels great - even though 80 he wishes active surveillance; exam shows 30 gm prostate r>l wo nodules or induration; will continue six month surveillance  06/06/2011 medical assistant encounter - psa 2.2, cw, let pt  know his psa improved normal at 2.2; arrange appt with me in six months for " ua surveillance bph luts fam hx pca " - i recommend no psa testing; if he insists on psa testing, then get psa level two week prior;   12/08/2011 office encounter  - extensive fam hx of pca; here for fu exam, psa's have been normal recently so pt deferred psa testing; exam shows 30 gm prostate R>L  wo nodules; continue six months surveillance  psa was drawn for some reason, psa 2.2; no more psa's, letter to patient  06/18/2012 office encounter  - pt insists on continued psa checks in addition to prostate exam due to strong family hx of pca death; exam shows 30 gm prostate l>r wo nodules or induration; psa currently is 2.4, reviewed psa hx with im, recheck in six months;    12/25/2012 office encounter  - pt concerned about psa jump from 2.4 to 5; has sig fam hx pca; appetite good, weight stable, no unusual bone or joint pains; 30 gm prostate l>r wo nodules or induration; will recheck f/t psa in thee months, as long as stable, then fu exam in six months with f/t psa  01/08/2013 medical assistant encounter - pt not willing to wait three months for recheck psa, repeat psa 2.3 with reassuring %free psa 33.5%  01/14/2013 telephone encounter - debbie, call pt, im sure he already knows value but his repeat psa 2.3 and %free psa is normal 33.5%, i recommend no further psa testing but pt insists on psa testing; cancel planned appt in January for psa testing; ask him when he wants his next psa done? Likely arrange nurse appt mid April for f/t psa and exam with me late April for " pvr/ua surveillance fam hx pca bph luts ibe (confirm f/t psa prior) "   06/12/2013 medical assistant encounter - psa normal 2.78 %free psa 32%; pt has an invalid appt for april 8th, this was missed on checkout april 1st and not rescheduled to a valid date and appt time; [name], please make sure this gets changed on Monday apirl 6th so pt doesn't come to office expecting to be seen on Wednesday  07/09/2013 office encounter  - current psa 2.78, pt with fam hx of pca in two brothers, one son with pca, one died of pca; pt has high anxiety about pca; reviewed his psa hx in detail with him, asided from transient spike last october, psa have been very reassuringly normal and with reassuring %free psa; 3xam today shows 35 gm benign prostate wo nodules or induration; pt wants continued six month surveillance with f/t psa and exam;   09/27/2013 medical assistant encounter - psa increased to 4.44, spiked to 5.09 last fall so no particular worry here, pvr 80 cc, ua benign;    09/30/2013 telephone encounter - debbie, not sure why he came in for psa at this time - ask him reason when you call him; yes, his psa did bump up a  bit to 4.44 but that is not concerning, had spike last fall to 5.09; he has appts for f/t psa in late september and fu exam mid october; also let him know his urine ok and pvr ok;   12/23/2013 office encounter  - psa has increased to 5.81 with favorable %free psa 24% - have finally reached his level of intolerance of my recommended ww/active surveillance; pt has fam hx of pca death and is very apprehensive about this rise in psa; has seen  shellhammer and lynch in past; discussed options - he understands risks of nbxp including death from sepsis; he would like to see oncologist; will arrange for him to see Dr Given; offered him 12 core biopsy today; discussed targeted biopsy but best to go with 12 core first; happy to see him in fu and do biopsy pending his discussion with Dr Given; pt understands reluctance to proceed with radiation therapy at age 80 with its risks and complications; certainly would not entertain surgery; will see what Dr Given has to say.  Exam today shows 35 gm benign prostate wo nodules or induration, left lobe maybe slightly larger than right       BPH w LUTS   Early 2000's microwave treatment - successful   [id number] Burnadette Peter(Lynch) notes This is a follow-up visit for Dena BilletHarold Nickson who has an established diagnosis of BPH with obstruction. There has been improvement of his symptoms. He reports nocturia one time a night. His current medications includes no medications. He has not experienced Problems when on these medications. Since his last visit he notes less frequency less nocturia less urgency   [id number] Burnadette Peter(Lynch) notes There has been improvement of his symptoms. He reports nocturia is rare now. He is bothered by some daytime urgency, which has become more noticeable over the past 6 mos. His current medications includes no medications. Since his last visit he notes less frequency less nocturia But more daytime urgency; PLAN Sanctura trial    There has been further improvement of his symptoms. He reports nocturia is rare now, and not more than once nightly. He was bothered by some daytime urgency, but began Sanctura with good improvement.   [id number] Burnadette Peter(Lynch) notes His current medications includes no BPH medications. He continues on ReunionSanctura, with good results.   [id number] Burnadette Peter(Lynch) notes There has been improvement of his symptoms. He reports nocturia one or two times a night. He is emptying well, with a PVR of 310 cc today. He is reasonably happy with his voiding despite this. His current medications includes Sanctura 60 mgm qHS. He has not experienced side effects on this medication.   [id number] [name](Oscar) notes irritable sxs controlled with sanctura, notes 40 gm benign prostate   10/04/2009 chart entry - chart reviewed, note pt has rx for sudafed and is on sanctura with 40 gm prostate - will discuss impact of this rx on bph sxs and increased pvr;   11/19/2009 office encounter - bph surveillance, reviewed risk of sudafed with bph - uses rarely; remains on sanctura xr on empty stomach, pleased with bladder control; discussed trial of cessation if he would like since doing; denies hematuria or dysuria; urine is dip and micro negative, exam shows 30 gm benign prostate l>r wo Nodules; ok to trial cesation of sanctura if he'd like   05/01/2010 chart entry ??? changed insurance needs alternative for sanctura since not on his insurance approved list, meds listed include oxybutynin, oxybutynin er 5, 10, 15, enablex, gelnique, vesicare; will go with vesicare 10 mg #30 rfx11, [facility]  05/06/2010 chart entry ??? vesicare too expensive, pt wants generic, pt advised of short term memory loss associated with oxybutynin; Rx oxbutynin er 10 mg daily #30 [id number]  05/27/2010 office encounter  - pt opted to stay w vesicare and avoid side effects with the oxybutynin; his AUA symptom score (detailed in the documents flowsheet) is only 2 on vesicare; pt pleased; will titrate dose  to every other and then every third; whatever minimum dosage is  effective;   12/06/2010 office encounter  - weaned off vesicare, his AUA symptom score (detailed in the documents flowsheet) is 7; denies hematuria or dysuria; ua dip and micro negative;   12/08/2011 office encounter  - bph surveillance, no meds; pt satisfied with voiding; his AUA symptom score (detailed in the documents flowsheet) is 4, denies hematuria or dysuria; ua dip and micro negative; occl urgency  06/18/2012 office encounter  - not on any meds right now, stream a little slower; his AUA symptom score (detailed in the documents flowsheet) is 9; denies hematuria or dysuria; ua dip and micro negative; exam shows 30 gm benign prostate wo nodules or induration; will check pvr; pt defers any meds for now; if beomes bothered will call and we will trial flomax;   12/25/2012 office encounter  - bph surveillance, mild bother, pt defers meds in spite of stop and start nature to stream;   07/09/2013 office encounter  - bph surveillance, has enlarged prostate, tolerates his auass 12 ok; denies hematuria or dysuria;   09/27/2013 medical assistant encounter - psa increased to 4.44, spiked to 5.09 last fall so no particular worry here, pvr 80 cc, ua benign;    12/23/2013 office encounter  - bph /luts still some bother but not more so than his anxiety about pca and rising psa; his AUA symptom score (detailed in GU DATA/REVIEW OF LABS AND IMAGING below) is 18;       IBE /Weak Stream  POST VOID RESIDUAL  10/22/2007  04/21/2008  10/20/2008    PVR  72ml  58cc  310   11/19/2009 office encounter - auass 4, pvr 45 cc, pt wo bother   05/27/2010 office encounter  - auass 2, under influence of vesicare  12/06/2010 office encounter  - auass 7, has stopped vesicare, pvr 10cc  12/08/2011 office encounter  - auass 4 wo bother, no meds  06/18/2012 office encounter  - auass 9, stream a little slower; pvr 163 cc (initially 269, had pt double void and pvr 163 cc) will recheck pvr in a month;    12/25/2012 office encounter  - auass 13, not bothered, pvr 184 cc;   07/09/2013 office encounter  - auass 12, pvr 98 cc, defers meds  09/27/2013 medical assistant encounter - pvr 80 cc  12/23/2013 office encounter  - auass 18; pvr 32 cc, some bother      Family Hx of Prostate Cancer   11/19/2009 office encounter - reports 3 brothers with pca  12/06/2010 office encounter  - now his 74 yo son has been diagnosed with pca;  12/25/2012 office encounter  -  Sig fam hx of pca;     PAST MEDICAL HISTORY    Past Surgical History   Procedure Laterality Date   ??? Hx orthopaedic     ??? Pr cardiac surg procedure unlist         Past Medical History   Diagnosis Date   ??? Elevated PSA    ??? Family hx of prostate cancer    ??? Urgency of urination    ??? Benign localized hyperplasia of prostate with urinary obstruction and other lower urinary tract symptoms (LUTS)(600.21)    ??? Incomplete bladder emptying    ??? BPH without obstruction/lower urinary tract symptoms    ??? Urgency of urination      Current Outpatient Prescriptions   Medication Sig Dispense Refill   ??? FLUZONE HIGH-DOSE 2015-16, PF, syrg injection   0   ??? simvastatin (ZOCOR) 20 mg  tablet   1   ??? loratadine (CLARITIN) 10 mg tablet Take 10 mg by mouth daily.     ??? sertraline (ZOLOFT) 50 mg tablet   3   ??? oxycodone-acetaminophen (PERCOCET) 5-325 mg per tablet      ??? gabapentin (NEURONTIN) 300 mg capsule      ??? OMEPRAZOLE (PRILOSEC PO) Take  by mouth.     ??? ipratropium (ATROVENT) 0.03 % nasal spray 2 Sprays every twelve (12) hours.     ??? acetaminophen (TYLENOL) 325 mg tablet Take  by mouth every four (4) hours as needed.     ??? montelukast (SINGULAIR) 10 mg tablet Take 10 mg by mouth daily.       ??? Cetirizine (ZYRTEC) 10 mg Cap Take  by mouth.       ??? aspirin delayed-release 81 mg tablet Take  by mouth daily.       ??? GUAIFENESIN (MUCINEX PO) Take  by mouth.         Allergies   Allergen Reactions   ??? Milk Contact Dermatitis   ??? Other Plant, Animal, Environmental Other (comments)      Pollens, trees, dust causes chronic rhinitis and congestion   ??? Sulfa (Sulfonamide Antibiotics) Unknown (comments)     Family History   Problem Relation Age of Onset   ??? Cancer     ??? Hypertension         Social  History   Smoking status   ??? Never Smoker    Smokeless tobacco   ??? Never Used     History   Alcohol Use   ??? 0.5 oz/week   ??? 1 Glasses of wine per week        REVIEW OF SYSTEMS  Constitutional: Fever: No;  Skin: Rash: No;  HEENT: Hearing difficulty: No;  Eyes: Blurred vision: No;  Cardiovascular: Chest pain: No;  Respiratory: Shortness of breath: No;  Gastrointestinal: Nausea/vomiting: No;  Musculoskeletal: Back pain: No;  Neurological: Weakness: No;  Psychological: Memory loss: No;  Comments/additional findings:      PHYSICAL EXAM  BP 110/60 mmHg   Ht 5\' 7"  (1.702 m)   Wt 162 lb (73.483 kg)   BMI 25.37 kg/m2    The BMI follow up plan is as follows: bmi no issue    GENERAL APPEARANCE:  80 y.o. Caucasian  male alert, cooperative, no distress, appears stated age not obese;  ABDOMEN:  flat soft, non-tender. Bowel sounds normal. No masses,  no organomegaly  BACK:  symmetric, no curvature. ROM normal. No CVA tenderness.  GENITOURINARY:    Penis:  normal, noncircumcised  Scrotum:  normal  Testes:  normal, no masses, size 20cc  Epididymides:  normal  Anus/Perineum:  within normal limits  Sphincter tone:  within normal limits  Rectum:  negative without mass, lesions or tenderness    Prostate:  35 gm benign prostate wo nodules or induration, left lobe maybe slightly larger than right  Seminal vesicles:  nonpalpable  LYMPHATIC:  Cervical, supraclavicular, and axillary nodes normal.      GU DATA/REVIEW OF LABS AND IMAGING    AUA Assessment Score:  AUA Score: 19;    AUA Bother Rating: Mixed-about equally satisfied    AUA Symptom Score 12/23/2013   Over the past month how often have you had the sensation that your bladder was not completely empty after you finished urinating? 3    Over the past month, how often have had to urinate again less than 2 hours after  you last finished urinating? 2   Over the past month, how often have you found you stopped and started again several times when you urinated? 4   Over the past month, how often have you found it difficult to postpone urination? 2   Over the past month, how often have you had a weak urinary stream? 4   Over the past month, how often have you had to push or strain to begin urinating? 3   Over the past month, how many times did you most typically get up to urinate from the time you went to bed at night until the time you got up in the morning? 1   AUA Score 19   If you were to spend the rest of your life with your urinary condition the way it is now, how would you feel about that? Mixed-about equally satisfied       Results for orders placed or performed in visit on 12/23/13   AMB POC PVR, MEAS,POST-VOID RES,US,NON-IMAGING   Result Value Ref Range    PVR 32 cc   AMB POC URINALYSIS DIP STICK AUTO W/ MICRO (MICRO RESULTS)   Result Value Ref Range    Color (UA POC) Yellow     Clarity (UA POC) Clear     Glucose (UA POC) Negative Negative    Bilirubin (UA POC) Negative Negative    Ketones (UA POC) Negative Negative    Specific gravity (UA POC) 1.015 1.001 - 1.035    Blood (UA POC) Negative Negative    pH (UA POC) 6.0 4.6 - 8.0    Protein (UA POC) Negative Negative mg/dL    Urobilinogen (UA POC) 0.2 mg/dL 0.2 - 1    Nitrites (UA POC) Negative Negative    Leukocyte esterase (UA POC) Negative Negative    Epithelial cells (UA POC)      WBCs (UA POC) n     RBCs (UA POC) n     Bacteria (UA POC) n Negative    Crystals (UA POC)  Negative    Other (UA POC)       Other Lab Data Reviewed with patient:   YES    PSA    Copy to [name], MD  and other appropriate care team members eFaxed today.     [name], MD  12/23/2013

## 2013-12-24 NOTE — Progress Notes (Signed)
Patient called to see if he can get earlier appointment with dr. Given due to his rising PSA level ref. From dr. Nicanor AlconLasater. Call was [facility] to [facility].

## 2014-01-01 ENCOUNTER — Encounter: Primary: Family Medicine

## 2014-01-01 ENCOUNTER — Institutional Professional Consult (permissible substitution): Admit: 2014-01-01 | Discharge: 2014-01-01 | Payer: MEDICARE | Primary: Family Medicine

## 2014-01-01 DIAGNOSIS — R972 Elevated prostate specific antigen [PSA]: Secondary | ICD-10-CM

## 2014-01-01 NOTE — Progress Notes (Signed)
Jose BilletHarold Stafford presents today for lab draw per Dr. Given order.    Patient will be notified by the physician with lab results.    PSA F/T obtained via venipuncture without any difficulty.      Orders Placed This Encounter   ??? PSA, TOTAL &  FREE   ??? PR COLLECTION VENOUS BLOOD,VENIPUNCTURE       [name] on 01/01/2014

## 2014-01-02 LAB — PSA, TOTAL &  FREE
PSA, % Free: 31.8 %
PSA, Free: 1.05 ng/mL
Prostate Specific Ag: 3.3 ng/mL (ref 0.0–4.0)

## 2014-01-09 ENCOUNTER — Encounter: Attending: Urology | Primary: Family Medicine

## 2014-01-15 ENCOUNTER — Ambulatory Visit: Admit: 2014-01-15 | Discharge: 2014-01-15 | Payer: MEDICARE | Attending: Urology | Primary: Family Medicine

## 2014-01-15 DIAGNOSIS — R972 Elevated prostate specific antigen [PSA]: Secondary | ICD-10-CM

## 2014-01-15 LAB — AMB POC URINALYSIS DIP STICK AUTO W/O MICRO
Bilirubin (UA POC): NEGATIVE
Blood (UA POC): NEGATIVE
Glucose (UA POC): NEGATIVE
Ketones (UA POC): NEGATIVE
Leukocyte esterase (UA POC): NEGATIVE
Nitrites (UA POC): NEGATIVE
Protein (UA POC): NEGATIVE mg/dL
Specific gravity (UA POC): 1.01 (ref 1.001–1.035)
Urobilinogen (UA POC): 0.2 (ref 0.2–1)
pH (UA POC): 5.5 (ref 4.6–8.0)

## 2014-01-15 NOTE — Progress Notes (Signed)
IMPRESSION:  1. Variable PSA, highest of 5.81ng/ml in 11/2013 and most recently 3.3ng/ml in 12/2013. Has been on active surveillance in the past with Dr. Nicanor AlconLasater but requested consultation with oncology. I advised pt at his age that he should not get too excited about his PSA, in fact the guidelines would not advise further screening.    2. BPH with mild LUTS, specifically weak FOS, followed by Dr. Nicanor AlconLasater     PLAN:  1. FU with Dr. Lasater q326months as planned     Chief Complaint   Patient presents with   ??? Elevated PSA       HPI:    Jose Stafford is a 80 y.o. male who presents for elevated PSA consult referred by Dr. Nicanor AlconLasater. He has been previously followed by Dr. Nicanor AlconLasater for prostate cancer screening however he wanted a second opinion as to if he should remain on active surveillance. Highest documented PSA of 5.81ng/ml in 11/2013 and most recently 3.3ng/ml in 12/2013. Patient admits to being anxious about his PSA readings as his brother died from metastatic prostate cancer. Patient with strong family history of prostate cancer in 3 brothers and 2 sons.     Patient also with a hx of BPH with mild LUTS. Currently reports weakened FOS and difficulty starting and stopping his urinary stream. Denies dysuria, urgency, frequency, or incontinence.       Past Medical History   Diagnosis Date   ??? Elevated PSA    ??? Family hx of prostate cancer    ??? Urgency of urination    ??? Benign localized hyperplasia of prostate with urinary obstruction and other lower urinary tract symptoms (LUTS)(600.21)    ??? Incomplete bladder emptying    ??? BPH without obstruction/lower urinary tract symptoms    ??? Urgency of urination    :  Past Surgical History   Procedure Laterality Date   ??? Hx orthopaedic     ??? Pr cardiac surg procedure unlist       Current Outpatient Prescriptions on File Prior to Visit   Medication Sig Dispense Refill   ??? simvastatin (ZOCOR) 20 mg tablet   1   ??? sertraline (ZOLOFT) 50 mg tablet   3    ??? OMEPRAZOLE (PRILOSEC PO) Take  by mouth.     ??? ipratropium (ATROVENT) 0.03 % nasal spray 2 Sprays every twelve (12) hours.     ??? acetaminophen (TYLENOL) 325 mg tablet Take  by mouth every four (4) hours as needed.     ??? montelukast (SINGULAIR) 10 mg tablet Take 10 mg by mouth daily.       ??? Cetirizine (ZYRTEC) 10 mg Cap Take  by mouth.       ??? aspirin delayed-release 81 mg tablet Take  by mouth daily.       ??? GUAIFENESIN (MUCINEX PO) Take  by mouth.         No current facility-administered medications on file prior to visit.   :      Allergies   Allergen Reactions   ??? Milk Contact Dermatitis   ??? Other Plant, Animal, Environmental Other (comments)     Pollens, trees, dust causes chronic rhinitis and congestion   ??? Sulfa (Sulfonamide Antibiotics) Unknown (comments)   :    Family History   Problem Relation Age of Onset   ??? Cancer     ??? Hypertension       History     Social History   ??? Marital Status: MARRIED  Spouse Name: N/A     Number of Children: N/A   ??? Years of Education: N/A     Occupational History   ??? Not on file.     Social History Main Topics   ??? Smoking status: Never Smoker    ??? Smokeless tobacco: Never Used   ??? Alcohol Use: 0.5 oz/week     1 Glasses of wine per week   ??? Drug Use: No   ??? Sexual Activity: Not on file     Other Topics Concern   ??? Not on file     Social History Narrative       Review of Systems  Constitutional: Fever: No  Skin: Rash: No  HEENT: Hearing difficulty: No  Eyes: Blurred vision: No  Cardiovascular: Chest pain: No  Respiratory: Shortness of breath: No  Gastrointestinal: Nausea/vomiting: No  Musculoskeletal: Back pain: No  Neurological: Weakness: No  Psychological: Memory loss: No  Comments/additional findings:     PHYSICAL EXAM:  BP 130/70 mmHg   Ht 5\' 7"  (1.702 m)   Wt 156 lb (70.761 kg)   BMI 24.43 kg/m2  General: alert, oriented to person, place and time, no acute distress and no anxiety, depression or agitation   Chest: inspection normal - no chest wall deformities or tenderness, respiratory effort normal  Prostate: 2+ smooth and symmetric without nodules or tenderness  Extremities: extremities normal, atraumatic, no cyanosis or edema  Neuro: alert, oriented x3         Results for orders placed or performed in visit on 01/15/14   AMB POC URINALYSIS DIP STICK AUTO W/O MICRO   Result Value Ref Range    Color (UA POC)      Clarity (UA POC)      Glucose (UA POC) Negative Negative    Bilirubin (UA POC) Negative Negative    Ketones (UA POC) Negative Negative    Specific gravity (UA POC) 1.010 1.001 - 1.035    Blood (UA POC) Negative Negative    pH (UA POC) 5.5 4.6 - 8.0    Protein (UA POC) Negative Negative mg/dL    Urobilinogen (UA POC) 0.2 mg/dL 0.2 - 1    Nitrites (UA POC) Negative Negative    Leukocyte esterase (UA POC) Negative Negative     PSA /TESTOSTERONE - B[facility]SI PSA PSA   Latest Ref Rng 0.0 - 4.0 ng/mL 0.00 - 4.00 ng/mL   01/01/2014 3.3    12/11/2013 5.81 (H)    09/27/2013 4.44 (H)    08/15/2013     06/12/2013 2.78    01/08/2013 2.3    12/18/2012  5.09 (A)   06/06/2012 2.4    12/08/2011 2.2    06/06/2011 2.2    11/30/2010  2.500        CC: Talbert CageHernani A Valerio, MD       Molly Maduroobert Mayur Duman MD   Urologic Oncologist  [facility]   Ann and Oren SectionPaul Schellhammer MD Professorship  Associate Professor of Urology  [facility] [employer]  Office [phone] Ext [phone]    Medical Documentation is provided with the assistance of [facility] Hines, medical scribe for Abbott PaoOBERT W Caileigh Canche, MD on 01/17/2014.

## 2014-01-19 NOTE — Progress Notes (Signed)
Elevated PSA/Family Hx of PCA death   05-07-2007 psa 1.9; 081009 psa 2.3; 020810 psa 1.6; 080910 psa 2.2; 030111 psa 3.1; 161096 psa 2.9; 045409 psa 2.23; 811914 psa 2.5; 782956 psa 2.2; 213086 psa 2.2;  578469 psa 2.4; 100714 psa 5.09,%free psa 19%; 102814 psa 2.3,%free psa 33.5%; 629528 psa 2.78 %free psa 32%; 413244 psa 4.44; 010272 psa 5.81 %free psa 24%;      10/04/2009 chart entry - pt has fu appt with me in September having been referred by Dr Burnadette Peter per his retirement; 80 yo with normal psa but abnormal psa velocity; has bph;   11/19/2009 office encounter - Preexam psa pending, pt to call if hasn't heard psa report by two weeks; exam shows 30 gm prostate, l>r wo nodules; reassured pt no sign of cancer; copy of psa to pcp planned;   Psa 2.9, reassuring letter to patient, copy of report to Dr Donette Larry   05/20/2010 medical assistant encounter - psa draw 2.23;  appt near future  05/27/2010 office encounter  - elev psa surveillance in pt with family hx of sig pca; psa range is 1.9 - 3.1, present 2.23; pt anxious about his psa and family hx wants six month checks; urine is micro benign; exam shows 30 gm prostate r>l wo nodules or induration; reassured pt no sign of prostate cancer but at risk due to age and family hx; reassured him pca death unlikely for him;   2010-12-14 medical assistant encounter - psa draw, reassuring stable value of 2.5; appt near future  12/06/2010 office encounter  - elevated psa hx in pt with strong fam hx of pca - brothers and his son and 3 nephews all with pca; reviewed his psa hx which is reassuring with stable value of 2.5; pt wo sxs; feels great - even though 80 he wishes active surveillance; exam shows 30 gm prostate r>l wo nodules or induration; will continue six month surveillance  06/06/2011 medical assistant encounter - psa 2.2, cw, let pt know his psa improved normal at 2.2; arrange appt with me in six months for " ua surveillance bph luts fam hx pca " - i recommend no psa testing; if he  insists on psa testing, then get psa level two week prior;   12/08/2011 office encounter  - extensive fam hx of pca; here for fu exam, psa's have been normal recently so pt deferred psa testing; exam shows 30 gm prostate R>L  wo nodules; continue six months surveillance  psa was drawn for some reason, psa 2.2; no more psa's, letter to patient  06/18/2012 office encounter  - pt insists on continued psa checks in addition to prostate exam due to strong family hx of pca death; exam shows 30 gm prostate l>r wo nodules or induration; psa currently is 2.4, reviewed psa hx with im, recheck in six months;   12/25/2012 office encounter  - pt concerned about psa jump from 2.4 to 5; has sig fam hx pca; appetite good, weight stable, no unusual bone or joint pains; 30 gm prostate l>r wo nodules or induration; will recheck f/t psa in thee months, as long as stable, then fu exam in six months with f/t psa  01/08/2013 medical assistant encounter - pt not willing to wait three months for recheck psa, repeat psa 2.3 with reassuring %free psa 33.5%  01/14/2013 telephone encounter - debbie, call pt, im sure he already knows value but his repeat psa 2.3 and %free psa is normal 33.5%, i recommend no further  psa testing but pt insists on psa testing; cancel planned appt in January for psa testing; ask him when he wants his next psa done? Likely arrange nurse appt mid April for f/t psa and exam with me late April for " pvr/ua surveillance fam hx pca bph luts ibe (confirm f/t psa prior) "   06/12/2013 medical assistant encounter - psa normal 2.78 %free psa 32%; pt has an invalid appt for april 8th, this was missed on checkout april 1st and not rescheduled to a valid date and appt time; [name], please make sure this gets changed on Monday apirl 6th so pt doesn't come to office expecting to be seen on Wednesday  07/09/2013 office encounter  - current psa 2.78, pt with fam hx of pca in  two brothers, one son with pca, one died of pca; pt has high anxiety about pca; reviewed his psa hx in detail with him, asided from transient spike last october, psa have been very reassuringly normal and with reassuring %free psa; 3xam today shows 35 gm benign prostate wo nodules or induration; pt wants continued six month surveillance with f/t psa and exam;   09/27/2013 medical assistant encounter - psa increased to 4.44, spiked to 5.09 last fall so no particular worry here, pvr 80 cc, ua benign;    09/30/2013 telephone encounter - debbie, not sure why he came in for psa at this time - ask him reason when you call him; yes, his psa did bump up a bit to 4.44 but that is not concerning, had spike last fall to 5.09; he has appts for f/t psa in late september and fu exam mid october; also let him know his urine ok and pvr ok;   12/23/2013 office encounter  - psa has increased to 5.81 with favorable %free psa 24% - have finally reached his level of intolerance of my recommended ww/active surveillance; pt has fam hx of pca death and is very apprehensive about this rise in psa; has seen [facility]hammer and lynch in past; discussed options - he understands risks of nbxp including death from sepsis; he would like to see oncologist; will arrange for him to see Dr Given; offered him 12 core biopsy today; discussed targeted biopsy but best to go with 12 core first; happy to see him in fu and do biopsy pending his discussion with Dr Given; pt understands reluctance to proceed with radiation therapy at age 80 with its risks and complications; certainly would not entertain surgery; will see what Dr Given has to say.  Exam today shows 35 gm benign prostate wo nodules or induration, left lobe maybe slightly larger than right   [id number] GU ONC CONSULT GIVEN notes IMPRESSION: 1. Variable PSA, highest of 5.81ng/ml in 11/2013 and most recently 3.3ng/ml in 12/2013. Has been on active surveillance in the past with Dr. Nicanor Alcon but requested  consultation with oncology. I advised pt at his age that he should not get too excited about his PSA, in fact the guidelines would not advise further screening. 2. BPH with mild LUTS, specifically weak FOS, followed by Dr. Nicanor Alcon PLAN: 1. FU with Dr. Nicanor Alcon q 6 months as planned       BPH w LUTS   Early 2000's microwave treatment - successful   [id number] Burnadette Peter) notes This is a follow-up visit for Jose Stafford who has an established diagnosis of BPH with obstruction. There has been improvement of his symptoms. He reports nocturia one time a night. His current medications includes no medications.  He has not experienced Problems when on these medications. Since his last visit he notes less frequency less nocturia less urgency   [id number] Burnadette Peter(Lynch) notes There has been improvement of his symptoms. He reports nocturia is rare now. He is bothered by some daytime urgency, which has become more noticeable over the past 6 mos. His current medications includes no medications. Since his last visit he notes less frequency less nocturia But more daytime urgency; PLAN Sanctura trial   There has been further improvement of his symptoms. He reports nocturia is rare now, and not more than once nightly. He was bothered by some daytime urgency, but began Sanctura with good improvement.   [id number] Burnadette Peter(Lynch) notes His current medications includes no BPH medications. He continues on ReunionSanctura, with good results.   [id number] Burnadette Peter(Lynch) notes There has been improvement of his symptoms. He reports nocturia one or two times a night. He is emptying well, with a PVR of 310 cc today. He is reasonably happy with his voiding despite this. His current medications includes Sanctura 60 mgm qHS. He has not experienced side effects on this medication.   [id number] [name](Oscar) notes irritable sxs controlled with sanctura, notes 40 gm benign prostate   10/04/2009 chart entry - chart reviewed, note pt has rx for sudafed and is  on sanctura with 40 gm prostate - will discuss impact of this rx on bph sxs and increased pvr;   11/19/2009 office encounter - bph surveillance, reviewed risk of sudafed with bph - uses rarely; remains on sanctura xr on empty stomach, pleased with bladder control; discussed trial of cessation if he would like since doing; denies hematuria or dysuria; urine is dip and micro negative, exam shows 30 gm benign prostate l>r wo Nodules; ok to trial cesation of sanctura if he'd like   05/01/2010 chart entry ??? changed insurance needs alternative for sanctura since not on his insurance approved list, meds listed include oxybutynin, oxybutynin er 5, 10, 15, enablex, gelnique, vesicare; will go with vesicare 10 mg #30 [id number], [facility]  05/06/2010 chart entry ??? vesicare too expensive, pt wants generic, pt advised of short term memory loss associated with oxybutynin; Rx oxbutynin er 10 mg daily #30 [id number]  05/27/2010 office encounter  - pt opted to stay w vesicare and avoid side effects with the oxybutynin; his AUA symptom score (detailed in the documents flowsheet) is only 2 on vesicare; pt pleased; will titrate dose to every other and then every third; whatever minimum dosage is effective;   12/06/2010 office encounter  - weaned off vesicare, his AUA symptom score (detailed in the documents flowsheet) is 7; denies hematuria or dysuria; ua dip and micro negative;   12/08/2011 office encounter  - bph surveillance, no meds; pt satisfied with voiding; his AUA symptom score (detailed in the documents flowsheet) is 4, denies hematuria or dysuria; ua dip and micro negative; occl urgency  06/18/2012 office encounter  - not on any meds right now, stream a little slower; his AUA symptom score (detailed in the documents flowsheet) is 9; denies hematuria or dysuria; ua dip and micro negative; exam shows 30 gm benign prostate wo nodules or induration; will check pvr; pt defers any  meds for now; if beomes bothered will call and we will trial flomax;   12/25/2012 office encounter  - bph surveillance, mild bother, pt defers meds in spite of stop and start nature to stream;   07/09/2013 office encounter  - bph surveillance, has enlarged prostate, tolerates his  auass 12 ok; denies hematuria or dysuria;   09/27/2013 medical assistant encounter - psa increased to 4.44, spiked to 5.09 last fall so no particular worry here, pvr 80 cc, ua benign;    12/23/2013 office encounter  - bph /luts still some bother but not more so than his anxiety about pca and rising psa; his AUA symptom score (detailed in GU DATA/REVIEW OF LABS AND IMAGING below) is 18;       IBE /Weak Stream  POST VOID RESIDUAL  10/22/2007  04/21/2008  10/20/2008    PVR  72ml  58cc  310   11/19/2009 office encounter - auass 4, pvr 45 cc, pt wo bother   05/27/2010 office encounter  - auass 2, under influence of vesicare  12/06/2010 office encounter  - auass 7, has stopped vesicare, pvr 10cc  12/08/2011 office encounter  - auass 4 wo bother, no meds  06/18/2012 office encounter  - auass 9, stream a little slower; pvr 163 cc (initially 269, had pt double void and pvr 163 cc) will recheck pvr in a month;   12/25/2012 office encounter  - auass 13, not bothered, pvr 184 cc;   07/09/2013 office encounter  - auass 12, pvr 98 cc, defers meds  09/27/2013 medical assistant encounter - pvr 80 cc  12/23/2013 office encounter  - auass 18; pvr 32 cc, some bother      Family Hx of Prostate Cancer   11/19/2009 office encounter - reports 3 brothers with pca  12/06/2010 office encounter  - now his 78 yo son has been diagnosed with pca;  12/25/2012 office encounter  -  Sig fam hx of pca;

## 2014-01-30 ENCOUNTER — Encounter: Primary: Family Medicine

## 2014-02-12 ENCOUNTER — Encounter: Attending: Urology | Primary: Family Medicine

## 2014-07-16 ENCOUNTER — Encounter: Attending: Urology | Primary: Family Medicine

## 2014-07-17 ENCOUNTER — Encounter: Attending: Urology | Primary: Family Medicine

## 2014-08-05 ENCOUNTER — Ambulatory Visit: Attending: Urology | Primary: Family Medicine

## 2014-08-05 LAB — AMB POC URINALYSIS DIP STICK AUTO W/ MICRO (MICRO RESULTS)
Bacteria (UA POC): NEGATIVE
Bilirubin (UA POC): NEGATIVE
Blood (UA POC): NEGATIVE
Glucose (UA POC): NEGATIVE
Ketones (UA POC): NEGATIVE
Leukocyte esterase (UA POC): NEGATIVE
Nitrites (UA POC): NEGATIVE
Protein (UA POC): NEGATIVE mg/dL
RBCs (UA POC): NEGATIVE
Specific gravity (UA POC): 1.01 (ref 1.001–1.035)
Urobilinogen (UA POC): 0.2 (ref 0.2–1)
WBCs (UA POC): NEGATIVE
pH (UA POC): 6.5 (ref 4.6–8.0)

## 2014-08-05 NOTE — Progress Notes (Addendum)
[name], MD  [facility] [place], [facility]  [address]  [place], [place] [address]  [phone]    Patient:  Jose Stafford  DOB:  Mar 10, 1928  Date of Service:  08/05/2014    ASSESSMENT/PLAN    Encounter Diagnoses   Name Primary?   ??? Elevated prostate specific antigen (PSA) Yes   ??? Family hx of prostate cancer    ??? Benign non-nodular prostatic hyperplasia with lower urinary tract symptoms    ??? Urgency of urination    ??? Incomplete bladder emptying        Assessment:  Sig fam hx of pca, pt has anxiety about pca; has rising psa, current psa pending, bph /luts not as bothersome as before;   Plan:  arrrange fu pending restuls today      HISTORY OF PRESENT ILLNESS  He is currently 81 y.o. and his primary care provider is [name], MD.    Chief Complaint   Patient presents with   ??? Elevated PSA       Elevated PSA/Family Hx of PCA death   May 02, 2007 psa 1.9; 081009 psa 2.3; 020810 psa 1.6; 914782 psa 2.2; 956213 psa 3.1; 086578 psa 2.9; 469629 psa 2.23; 528413 psa 2.5; 244010 psa 2.2; 272536 psa 2.2;  644034 psa 2.4; 100714 psa 5.09,%free psa 19%; 102814 psa 2.3,%free psa 33.5%; 742595 psa 2.78 %free psa 32%; 071715 psa 4.44; 093015 psa 5.81 %free psa 24%; 052416 psa 2.91 %free psa 32%;      10/04/2009 chart entry - pt has fu appt with me in September having been referred by Dr Burnadette Peter per his retirement; 81 yo with normal psa but abnormal psa velocity; has bph;   11/19/2009 office encounter - Preexam psa pending, pt to call if hasn't heard psa report by two weeks; exam shows 30 gm prostate, l>r wo nodules; reassured pt no sign of cancer; copy of psa to pcp planned;   Psa 2.9, reassuring letter to patient, copy of report to Dr Donette Larry   05/20/2010 medical assistant encounter - psa draw 2.23;  appt near future  05/27/2010 office encounter  - elev psa surveillance in pt with family hx of sig pca; psa range is 1.9 - 3.1, present 2.23; pt anxious about his psa  and family hx wants six month checks; urine is micro benign; exam shows 30 gm prostate r>l wo nodules or induration; reassured pt no sign of prostate cancer but at risk due to age and family hx; reassured him pca death unlikely for him;   12/09/10 medical assistant encounter - psa draw, reassuring stable value of 2.5; appt near future  12/06/2010 office encounter  - elevated psa hx in pt with strong fam hx of pca - brothers and his son and 3 nephews all with pca; reviewed his psa hx which is reassuring with stable value of 2.5; pt wo sxs; feels great - even though 1 he wishes active surveillance; exam shows 30 gm prostate r>l wo nodules or induration; will continue six month surveillance  06/06/2011 medical assistant encounter - psa 2.2, cw, let pt know his psa improved normal at 2.2; arrange appt with me in six months for " ua surveillance bph luts fam hx pca " - i recommend no psa testing; if he insists on psa testing, then get psa level two week prior;   12/08/2011 office encounter  - extensive fam hx of pca; here for fu exam, psa's have been normal recently so pt deferred  psa testing; exam shows 30 gm prostate R>L  wo nodules; continue six months surveillance  psa was drawn for some reason, psa 2.2; no more psa's, letter to patient  06/18/2012 office encounter  - pt insists on continued psa checks in addition to prostate exam due to strong family hx of pca death; exam shows 30 gm prostate l>r wo nodules or induration; psa currently is 2.4, reviewed psa hx with im, recheck in six months;   12/25/2012 office encounter  - pt concerned about psa jump from 2.4 to 5; has sig fam hx pca; appetite good, weight stable, no unusual bone or joint pains; 30 gm prostate l>r wo nodules or induration; will recheck f/t psa in thee months, as long as stable, then fu exam in six months with f/t psa  01/08/2013 medical assistant encounter - pt not willing to wait three  months for recheck psa, repeat psa 2.3 with reassuring %free psa 33.5%  01/14/2013 telephone encounter - debbie, call pt, im sure he already knows value but his repeat psa 2.3 and %free psa is normal 33.5%, i recommend no further psa testing but pt insists on psa testing; cancel planned appt in January for psa testing; ask him when he wants his next psa done? Likely arrange nurse appt mid April for f/t psa and exam with me late April for " pvr/ua surveillance fam hx pca bph luts ibe (confirm f/t psa prior) "   06/12/2013 medical assistant encounter - psa normal 2.78 %free psa 32%; pt has an invalid appt for april 8th, this was missed on checkout april 1st and not rescheduled to a valid date and appt time; [name], please make sure this gets changed on Monday apirl 6th so pt doesn't come to office expecting to be seen on Wednesday  07/09/2013 office encounter  - current psa 2.78, pt with fam hx of pca in two brothers, one son with pca, one died of pca; pt has high anxiety about pca; reviewed his psa hx in detail with him, asided from transient spike last october, psa have been very reassuringly normal and with reassuring %free psa; 3xam today shows 35 gm benign prostate wo nodules or induration; pt wants continued six month surveillance with f/t psa and exam;   09/27/2013 medical assistant encounter - psa increased to 4.44, spiked to 5.09 last fall so no particular worry here, pvr 80 cc, ua benign;    09/30/2013 telephone encounter - debbie, not sure why he came in for psa at this time - ask him reason when you call him; yes, his psa did bump up a bit to 4.44 but that is not concerning, had spike last fall to 5.09; he has appts for f/t psa in late september and fu exam mid october; also let him know his urine ok and pvr ok;   12/23/2013 office encounter  - psa has increased to 5.81 with favorable %free psa 24% - have finally reached his level of intolerance of my  recommended ww/active surveillance; pt has fam hx of pca death and is very apprehensive about this rise in psa; has seen shellhammer and lynch in past; discussed options - he understands risks of nbxp including death from sepsis; he would like to see oncologist; will arrange for him to see Dr Given; offered him 12 core biopsy today; discussed targeted biopsy but best to go with 12 core first; happy to see him in fu and do biopsy pending his discussion with Dr Given; pt understands reluctance to  proceed with radiation therapy at age 18 with its risks and complications; certainly would not entertain surgery; will see what Dr Given has to say.  Exam today shows 35 gm benign prostate wo nodules or induration, left lobe maybe slightly larger than right   [id number] GU ONC CONSULT GIVEN notes IMPRESSION: 1. Variable PSA, highest of 5.81ng/ml in 11/2013 and most recently 3.3ng/ml in 12/2013. Has been on active surveillance in the past with Dr. Nicanor Alcon but requested consultation with oncology. I advised pt at his age that he should not get too excited about his PSA, in fact the guidelines would not advise further screening. 2. BPH with mild LUTS, specifically weak FOS, followed by Dr. Nicanor Alcon PLAN: 1. FU with Dr. Nicanor Alcon q 6 months as planned   08/05/2014 office encounter  - Preexam F/T PSA pending, pt to call if hasn't heard F/T PSA report by two weeks; sig fam hx of pca; exam shows 40 gm benign prostate wo nodules or induration; will make fu plans pending current psa results  psa 2.91, %free psa 32%; letter to patient and results to pcp, gave pt choice of fu six months or a year.      BPH w LUTS   Early 2000's microwave treatment - successful   [id number] Burnadette Peter) notes This is a follow-up visit for Ponciano Shealy who has an established diagnosis of BPH with obstruction. There has been improvement of his symptoms. He reports nocturia one time a night. His current medications includes no medications. He has not experienced  Problems when on these medications. Since his last visit he notes less frequency less nocturia less urgency   [id number] Burnadette Peter) notes There has been improvement of his symptoms. He reports nocturia is rare now. He is bothered by some daytime urgency, which has become more noticeable over the past 6 mos. His current medications includes no medications. Since his last visit he notes less frequency less nocturia But more daytime urgency; PLAN Sanctura trial   There has been further improvement of his symptoms. He reports nocturia is rare now, and not more than once nightly. He was bothered by some daytime urgency, but began Sanctura with good improvement.   [id number] Burnadette Peter) notes His current medications includes no BPH medications. He continues on Reunion, with good results.   [id number] Burnadette Peter) notes There has been improvement of his symptoms. He reports nocturia one or two times a night. He is emptying well, with a PVR of 310 cc today. He is reasonably happy with his voiding despite this. His current medications includes Sanctura 60 mgm qHS. He has not experienced side effects on this medication.   [id number] [name]) notes irritable sxs controlled with sanctura, notes 40 gm benign prostate   10/04/2009 chart entry - chart reviewed, note pt has rx for sudafed and is on sanctura with 40 gm prostate - will discuss impact of this rx on bph sxs and increased pvr;   11/19/2009 office encounter - bph surveillance, reviewed risk of sudafed with bph - uses rarely; remains on sanctura xr on empty stomach, pleased with bladder control; discussed trial of cessation if he would like since doing; denies hematuria or dysuria; urine is dip and micro negative, exam shows 30 gm benign prostate l>r wo Nodules; ok to trial cesation of sanctura if he'd like   05/01/2010 chart entry ??? changed insurance needs alternative for sanctura since not on his insurance approved list, meds listed include oxybutynin,  oxybutynin er 5, 10, 15, enablex, gelnique, vesicare; will go  with vesicare 10 mg #30 rfx11, [facility]  05/06/2010 chart entry ??? vesicare too expensive, pt wants generic, pt advised of short term memory loss associated with oxybutynin; Rx oxbutynin er 10 mg daily #30 [id number]  05/27/2010 office encounter  - pt opted to stay w vesicare and avoid side effects with the oxybutynin; his AUA symptom score (detailed in the documents flowsheet) is only 2 on vesicare; pt pleased; will titrate dose to every other and then every third; whatever minimum dosage is effective;   12/06/2010 office encounter  - weaned off vesicare, his AUA symptom score (detailed in the documents flowsheet) is 7; denies hematuria or dysuria; ua dip and micro negative;   12/08/2011 office encounter  - bph surveillance, no meds; pt satisfied with voiding; his AUA symptom score (detailed in the documents flowsheet) is 4, denies hematuria or dysuria; ua dip and micro negative; occl urgency  06/18/2012 office encounter  - not on any meds right now, stream a little slower; his AUA symptom score (detailed in the documents flowsheet) is 9; denies hematuria or dysuria; ua dip and micro negative; exam shows 30 gm benign prostate wo nodules or induration; will check pvr; pt defers any meds for now; if beomes bothered will call and we will trial flomax;   12/25/2012 office encounter  - bph surveillance, mild bother, pt defers meds in spite of stop and start nature to stream;   07/09/2013 office encounter  - bph surveillance, has enlarged prostate, tolerates his auass 12 ok; denies hematuria or dysuria;   09/27/2013 medical assistant encounter - psa increased to 4.44, spiked to 5.09 last fall so no particular worry here, pvr 80 cc, ua benign;    12/23/2013 office encounter  - bph /luts still some bother but not more so than his anxiety about pca and rising psa; his AUA symptom score (detailed in GU DATA/REVIEW OF LABS AND IMAGING below) is 18; \\   08/05/2014 office encounter  - bph surveillance, his AUA symptom score (detailed in GU DATA/REVIEW OF LABS AND IMAGING below) is 13, tolerates, denies gross hematuria or dysuria; ua dip and micro negative;      IBE /Weak Stream  POST VOID RESIDUAL  10/22/2007  04/21/2008  10/20/2008    PVR  72ml  58cc  310   11/19/2009 office encounter - auass 4, pvr 45 cc, pt wo bother   05/27/2010 office encounter  - auass 2, under influence of vesicare  12/06/2010 office encounter  - auass 7, has stopped vesicare, pvr 10cc  12/08/2011 office encounter  - auass 4 wo bother, no meds  06/18/2012 office encounter  - auass 9, stream a little slower; pvr 163 cc (initially 269, had pt double void and pvr 163 cc) will recheck pvr in a month;   12/25/2012 office encounter  - auass 13, not bothered, pvr 184 cc;   07/09/2013 office encounter  - auass 12, pvr 98 cc, defers meds  09/27/2013 medical assistant encounter - pvr 80 cc  12/23/2013 office encounter  - auass 18; pvr 32 cc, some bother  08/05/2014 office encounter  - auass 13, pt tolerates      Family Hx of Prostate Cancer   11/19/2009 office encounter - reports 3 brothers with pca  12/06/2010 office encounter  - now his 79 yo son has been diagnosed with pca;  12/25/2012 office encounter  -  Sig fam hx of pca;     PAST MEDICAL HISTORY    Past Surgical History  Procedure Laterality Date   ??? Hx orthopaedic     ??? Pr cardiac surg procedure unlist         Past Medical History   Diagnosis Date   ??? Elevated PSA    ??? Family hx of prostate cancer    ??? Urgency of urination    ??? Benign localized hyperplasia of prostate with urinary obstruction and other lower urinary tract symptoms (LUTS)(600.21)    ??? Incomplete bladder emptying    ??? BPH without obstruction/lower urinary tract symptoms    ??? Urgency of urination      Current Outpatient Prescriptions   Medication Sig Dispense Refill   ??? cyanocobalamin (VITAMIN B-12) 2,500 mcg sublingual tablet Take 2,500 mcg by mouth daily.      ??? simvastatin (ZOCOR) 20 mg tablet   1   ??? sertraline (ZOLOFT) 50 mg tablet   3   ??? OMEPRAZOLE (PRILOSEC PO) Take  by mouth.     ??? ipratropium (ATROVENT) 0.03 % nasal spray 2 Sprays every twelve (12) hours.     ??? acetaminophen (TYLENOL) 325 mg tablet Take  by mouth every four (4) hours as needed.     ??? montelukast (SINGULAIR) 10 mg tablet Take 10 mg by mouth daily.       ??? Cetirizine (ZYRTEC) 10 mg Cap Take  by mouth.       ??? aspirin delayed-release 81 mg tablet Take  by mouth daily.       ??? GUAIFENESIN (MUCINEX PO) Take  by mouth.         Allergies   Allergen Reactions   ??? Milk Contact Dermatitis   ??? Other Plant, Animal, Environmental Other (comments)     Pollens, trees, dust causes chronic rhinitis and congestion   ??? Sulfa (Sulfonamide Antibiotics) Unknown (comments)     Family History   Problem Relation Age of Onset   ??? Cancer     ??? Hypertension         Social  History   Smoking status   ??? Never Smoker    Smokeless tobacco   ??? Never Used     History   Alcohol Use   ??? 0.5 oz/week   ??? 1 Glasses of wine per week        REVIEW OF SYSTEMS  Constitutional: Fever: No  Skin: Rash: No  HEENT: Hearing difficulty: No  Eyes: Blurred vision: No  Cardiovascular: Chest pain: No  Respiratory: Shortness of breath: No  Gastrointestinal: Nausea/vomiting: No  Musculoskeletal: Back pain: No  Neurological: Weakness: No  Psychological: Memory loss: No  Comments/additional findings:      PHYSICAL EXAM  BP 120/70 mmHg   Ht 5\' 7"  (1.702 m)   Wt 165 lb (74.844 kg)   BMI 25.84 kg/m2    The BMI follow up plan is as follows: bmi ok    GENERAL APPEARANCE:  81 y.o. Caucasian  male alert, cooperative, no distress, appears stated age not obese;  ABDOMEN:  flat soft, non-tender. Bowel sounds normal. No masses,  no organomegaly  BACK:  symmetric, no curvature. ROM normal. No CVA tenderness.  GENITOURINARY:    Penis:  normal, noncircumcised  Scrotum:  normal  Testes:  normal, no masses, size 20cc  Epididymides:  normal   Anus/Perineum:  within normal limits  Sphincter tone:  within normal limits  Rectum:  negative without mass, lesions or tenderness    Prostate:  40 gm benign prostate wo nodules or induration, left lobe maybe slightly larger  than right  Seminal vesicles:  nonpalpable  LYMPHATIC:  Cervical, supraclavicular, and axillary nodes normal.    GU DATA/REVIEW OF LABS AND IMAGING    AUA Assessment Score:  AUA Score: 13;    AUA Bother Rating: Mostly satisified    AUA Symptom Score 08/05/2014   Over the past month how often have you had the sensation that your bladder was not completely empty after you finished urinating? 1   Over the past month, how often have had to urinate again less than 2 hours after you last finished urinating? 3   Over the past month, how often have you found you stopped and started again several times when you urinated? 3   Over the past month, how often have you found it difficult to postpone urination? 3   Over the past month, how often have you had a weak urinary stream? 2   Over the past month, how often have you had to push or strain to begin urinating? 1   Over the past month, how many times did you most typically get up to urinate from the time you went to bed at night until the time you got up in the morning? 0   AUA Score 13   If you were to spend the rest of your life with your urinary condition the way it is now, how would you feel about that? Mostly satisified       Results for orders placed or performed in visit on 08/05/14   PSA (TOTAL, REFLEX TO FREE)   Result Value Ref Range    Prostate Specific Ag 2.91 0.00 - 4.00 ng/mL   PSA (FREE)   Result Value Ref Range    PSA, Free 0.919 ng/mL    PSA, % Free 32 %   AMB POC URINALYSIS DIP STICK AUTO W/ MICRO (MICRO RESULTS)   Result Value Ref Range    Color (UA POC) Yellow     Clarity (UA POC) Clear     Glucose (UA POC) Negative Negative    Bilirubin (UA POC) Negative Negative    Ketones (UA POC) Negative Negative     Specific gravity (UA POC) 1.010 1.001 - 1.035    Blood (UA POC) Negative Negative    pH (UA POC) 6.5 4.6 - 8.0    Protein (UA POC) Negative Negative mg/dL    Urobilinogen (UA POC) 0.2 mg/dL 0.2 - 1    Nitrites (UA POC) Negative Negative    Leukocyte esterase (UA POC) Negative Negative    Epithelial cells (UA POC)      WBCs (UA POC) n     RBCs (UA POC) n     Bacteria (UA POC) n Negative    Crystals (UA POC)  Negative    Other (UA POC)         Copy to [name], MD and other appropriate care team members eFaxed today.     [name], MD  08/05/2014

## 2014-08-06 LAB — PSA (TOTAL, REFLEX TO FREE): Prostate Specific Ag: 2.91 ng/mL (ref 0.00–4.00)

## 2014-08-08 LAB — PSA (FREE)
PSA, % Free: 32 %
PSA, Free: 0.919 ng/mL

## 2015-01-13 ENCOUNTER — Encounter: Primary: Family Medicine

## 2015-01-19 ENCOUNTER — Institutional Professional Consult (permissible substitution): Admit: 2015-01-19 | Discharge: 2015-01-19 | Payer: MEDICARE | Primary: Family Medicine

## 2015-01-19 DIAGNOSIS — N401 Enlarged prostate with lower urinary tract symptoms: Secondary | ICD-10-CM

## 2015-01-19 NOTE — Progress Notes (Addendum)
Jose BilletHarold Stafford presents today for lab draw per Dr. Nicanor AlconLasater order.    Results for orders placed or performed in visit on 01/19/15   PSA (TOTAL, REFLEX TO FREE)   Result Value Ref Range    Prostate Specific Ag 2.69 0.00 - 4.00 ng/mL   PSA (FREE)   Result Value Ref Range    PSA, Free 0.841 ng/mL    PSA, % Free 31 %       Patient will be notified by the physician with lab results.    PSA free and total obtained via venipuncture without any difficulty.      Orders Placed This Encounter   ??? PSA (TOTAL, REFLEX TO FREE)   ??? PR COLLECTION VENOUS BLOOD,VENIPUNCTURE       [name]

## 2015-01-20 LAB — PSA (TOTAL, REFLEX TO FREE): Prostate Specific Ag: 2.69 ng/mL (ref 0.00–4.00)

## 2015-01-22 LAB — PSA (FREE)
PSA, % Free: 31 %
PSA, Free: 0.841 ng/mL

## 2015-01-22 NOTE — Addendum Note (Signed)
Addended by: Leone PayorLASATER, Lynnette Pote D on: 01/22/2015 04:14 AM      Modules accepted: Level of Service

## 2015-01-27 ENCOUNTER — Ambulatory Visit: Admit: 2015-01-27 | Discharge: 2015-01-27 | Payer: MEDICARE | Attending: Urology | Primary: Family Medicine

## 2015-01-27 DIAGNOSIS — N401 Enlarged prostate with lower urinary tract symptoms: Secondary | ICD-10-CM

## 2015-01-27 LAB — AMB POC URINALYSIS DIP STICK AUTO W/ MICRO (MICRO RESULTS)
Bilirubin (UA POC): NEGATIVE
Blood (UA POC): NEGATIVE
Crystals (UA POC): NEGATIVE
Epithelial cells (UA POC): NEGATIVE
Glucose (UA POC): NEGATIVE
Ketones (UA POC): NEGATIVE
Leukocyte esterase (UA POC): NEGATIVE
Nitrites (UA POC): NEGATIVE
Other (UA POC): NEGATIVE
Protein (UA POC): NEGATIVE mg/dL
RBCs (UA POC): 0
Specific gravity (UA POC): 1.015 (ref 1.001–1.035)
Urobilinogen (UA POC): 0.2 (ref 0.2–1)
WBCs (UA POC): 0
pH (UA POC): 6.5 (ref 4.6–8.0)

## 2015-01-27 NOTE — Progress Notes (Signed)
[name], MD  [facility], [facility]  [address]  [place], [place] [address]  [phone]    Patient:  Jose Stafford  DOB:  1927/06/21  Date of Service:  01/27/2015    ASSESSMENT/PLAN    Encounter Diagnoses   Name Primary?   ??? Benign non-nodular prostatic hyperplasia with lower urinary tract symptoms Yes   ??? Urgency of urination    ??? Incomplete bladder emptying    ??? Family hx of prostate cancer        Assessment:  Bph/luts with hx of transient elev psa and fam hx of pca - pt has high anxiety in spite of normal psa now with favorable %free psa  Plan:  Six month surveillance      HISTORY OF PRESENT ILLNESS  He is currently 81 y.o. and his primary care provider is [name], MD.    Chief Complaint   Patient presents with   ??? Elevated PSA       Elevated PSA/Family Hx of PCA death   05/15/07 psa 1.9; 081009 psa 2.3; 020810 psa 1.6; 962952 psa 2.2; 841324 psa 3.1; 401027 psa 2.9; 253664 psa 2.23; 403474 psa 2.5; 259563 psa 2.2; 875643 psa 2.2;  329518 psa 2.4; 100714 psa 5.09,%free psa 19%; 102814 psa 2.3,%free psa 33.5%; 841660 psa 2.78 %free psa 32%; 071715 psa 4.44; 093015 psa 5.81 %free psa 24%; 052416 psa 2.91 %free psa 32%;      10/04/2009 chart entry - pt has fu appt with me in September having been referred by Dr Burnadette Peter per his retirement; 81 yo with normal psa but abnormal psa velocity; has bph;   11/19/2009 office encounter - Preexam psa pending, pt to call if hasn't heard psa report by two weeks; exam shows 30 gm prostate, l>r wo nodules; reassured pt no sign of cancer; copy of psa to pcp planned;   Psa 2.9, reassuring letter to patient, copy of report to Dr Donette Larry   05/20/2010 medical assistant encounter - psa draw 2.23;  appt near future  05/27/2010 office encounter  - elev psa surveillance in pt with family hx of sig pca; psa range is 1.9 - 3.1, present 2.23; pt anxious about his psa and family hx wants six month checks; urine is micro benign; exam shows 30  gm prostate r>l wo nodules or induration; reassured pt no sign of prostate cancer but at risk due to age and family hx; reassured him pca death unlikely for him;   Dec 22, 2010 medical assistant encounter - psa draw, reassuring stable value of 2.5; appt near future  12/06/2010 office encounter  - elevated psa hx in pt with strong fam hx of pca - brothers and his son and 3 nephews all with pca; reviewed his psa hx which is reassuring with stable value of 2.5; pt wo sxs; feels great - even though 81 he wishes active surveillance; exam shows 30 gm prostate r>l wo nodules or induration; will continue six month surveillance  06/06/2011 medical assistant encounter - psa 2.2, cw, let pt know his psa improved normal at 2.2; arrange appt with me in six months for " ua surveillance bph luts fam hx pca " - i recommend no psa testing; if he insists on psa testing, then get psa level two week prior;   12/08/2011 office encounter  - extensive fam hx of pca; here for fu exam, psa's have been normal recently so pt deferred psa testing; exam shows 30 gm prostate R>L  wo nodules; continue six months surveillance  psa was drawn for some reason, psa 2.2; no more psa's, letter to patient  06/18/2012 office encounter  - pt insists on continued psa checks in addition to prostate exam due to strong family hx of pca death; exam shows 30 gm prostate l>r wo nodules or induration; psa currently is 2.4, reviewed psa hx with im, recheck in six months;   12/25/2012 office encounter  - pt concerned about psa jump from 2.4 to 5; has sig fam hx pca; appetite good, weight stable, no unusual bone or joint pains; 30 gm prostate l>r wo nodules or induration; will recheck f/t psa in thee months, as long as stable, then fu exam in six months with f/t psa  01/08/2013 medical assistant encounter - pt not willing to wait three months for recheck psa, repeat psa 2.3 with reassuring %free psa 33.5%   01/14/2013 telephone encounter - debbie, call pt, im sure he already knows value but his repeat psa 2.3 and %free psa is normal 33.5%, i recommend no further psa testing but pt insists on psa testing; cancel planned appt in January for psa testing; ask him when he wants his next psa done? Likely arrange nurse appt mid April for f/t psa and exam with me late April for " pvr/ua surveillance fam hx pca bph luts ibe (confirm f/t psa prior) "   06/12/2013 medical assistant encounter - psa normal 2.78 %free psa 32%; pt has an invalid appt for april 8th, this was missed on checkout april 1st and not rescheduled to a valid date and appt time; [name], please make sure this gets changed on Monday apirl 6th so pt doesn't come to office expecting to be seen on Wednesday  07/09/2013 office encounter  - current psa 2.78, pt with fam hx of pca in two brothers, one son with pca, one died of pca; pt has high anxiety about pca; reviewed his psa hx in detail with him, asided from transient spike last october, psa have been very reassuringly normal and with reassuring %free psa; 3xam today shows 35 gm benign prostate wo nodules or induration; pt wants continued six month surveillance with f/t psa and exam;   09/27/2013 medical assistant encounter - psa increased to 4.44, spiked to 5.09 last fall so no particular worry here, pvr 80 cc, ua benign;    09/30/2013 telephone encounter - debbie, not sure why he came in for psa at this time - ask him reason when you call him; yes, his psa did bump up a bit to 4.44 but that is not concerning, had spike last fall to 5.09; he has appts for f/t psa in late september and fu exam mid october; also let him know his urine ok and pvr ok;   12/23/2013 office encounter  - psa has increased to 5.81 with favorable %free psa 24% - have finally reached his level of intolerance of my recommended ww/active surveillance; pt has fam hx of pca death and is very  apprehensive about this rise in psa; has seen shellhammer and lynch in past; discussed options - he understands risks of nbxp including death from sepsis; he would like to see oncologist; will arrange for him to see Dr Given; offered him 12 core biopsy today; discussed targeted biopsy but best to go with 12 core first; happy to see him in fu and do biopsy pending his discussion with Dr Given; pt understands reluctance to proceed with radiation therapy at age 10 with its  risks and complications; certainly would not entertain surgery; will see what Dr Given has to say.  Exam today shows 35 gm benign prostate wo nodules or induration, left lobe maybe slightly larger than right   [id number] GU ONC CONSULT GIVEN notes IMPRESSION: 1. Variable PSA, highest of 5.81ng/ml in 11/2013 and most recently 3.3ng/ml in 12/2013. Has been on active surveillance in the past with Dr. Nicanor Alcon but requested consultation with oncology. I advised pt at his age that he should not get too excited about his PSA, in fact the guidelines would not advise further screening. 2. BPH with mild LUTS, specifically weak FOS, followed by Dr. Nicanor Alcon PLAN: 1. FU with Dr. Nicanor Alcon q 6 months as planned   08/05/2014 office encounter  - Preexam F/T PSA pending, pt to call if hasn't heard F/T PSA report by two weeks; sig fam hx of pca; exam shows 40 gm benign prostate wo nodules or induration; will make fu plans pending current psa results  psa 2.91, %free psa 32%; letter to patient and results to pcp, gave pt choice of fu six months or a year.   01/27/2015 office encounter  - hx of elev psa, fam hx pca, pt has high anxiety, current psa favorable 2.69 and %free psa favorable 31%; urine benign; exam shows 35 gm prostate L>R wo nodules or induration; pt wants six month surveillance       BPH w LUTS   Early 2000's microwave treatment - successful   [id number] Burnadette Peter) notes This is a follow-up visit for [name] who has  an established diagnosis of BPH with obstruction. There has been improvement of his symptoms. He reports nocturia one time a night. His current medications includes no medications. He has not experienced Problems when on these medications. Since his last visit he notes less frequency less nocturia less urgency   [id number] Burnadette Peter) notes There has been improvement of his symptoms. He reports nocturia is rare now. He is bothered by some daytime urgency, which has become more noticeable over the past 6 mos. His current medications includes no medications. Since his last visit he notes less frequency less nocturia But more daytime urgency; PLAN Sanctura trial   There has been further improvement of his symptoms. He reports nocturia is rare now, and not more than once nightly. He was bothered by some daytime urgency, but began Sanctura with good improvement.   [id number] Burnadette Peter) notes His current medications includes no BPH medications. He continues on Reunion, with good results.   [id number] Burnadette Peter) notes There has been improvement of his symptoms. He reports nocturia one or two times a night. He is emptying well, with a PVR of 310 cc today. He is reasonably happy with his voiding despite this. His current medications includes Sanctura 60 mgm qHS. He has not experienced side effects on this medication.   [id number] [name]) notes irritable sxs controlled with sanctura, notes 40 gm benign prostate   10/04/2009 chart entry - chart reviewed, note pt has rx for sudafed and is on sanctura with 40 gm prostate - will discuss impact of this rx on bph sxs and increased pvr;   11/19/2009 office encounter - bph surveillance, reviewed risk of sudafed with bph - uses rarely; remains on sanctura xr on empty stomach, pleased with bladder control; discussed trial of cessation if he would like since doing; denies hematuria or dysuria; urine is dip and micro negative, exam shows 30 gm benign prostate l>r wo Nodules; ok to trial cesation of  sanctura  if he'd like   05/01/2010 chart entry ??? changed insurance needs alternative for sanctura since not on his insurance approved list, meds listed include oxybutynin, oxybutynin er 5, 10, 15, enablex, gelnique, vesicare; will go with vesicare 10 mg #30 rfx11, [facility]  05/06/2010 chart entry ??? vesicare too expensive, pt wants generic, pt advised of short term memory loss associated with oxybutynin; Rx oxbutynin er 10 mg daily #30 [id number]  05/27/2010 office encounter  - pt opted to stay w vesicare and avoid side effects with the oxybutynin; his AUA symptom score (detailed in the documents flowsheet) is only 2 on vesicare; pt pleased; will titrate dose to every other and then every third; whatever minimum dosage is effective;   12/06/2010 office encounter  - weaned off vesicare, his AUA symptom score (detailed in the documents flowsheet) is 7; denies hematuria or dysuria; ua dip and micro negative;   12/08/2011 office encounter  - bph surveillance, no meds; pt satisfied with voiding; his AUA symptom score (detailed in the documents flowsheet) is 4, denies hematuria or dysuria; ua dip and micro negative; occl urgency  06/18/2012 office encounter  - not on any meds right now, stream a little slower; his AUA symptom score (detailed in the documents flowsheet) is 9; denies hematuria or dysuria; ua dip and micro negative; exam shows 30 gm benign prostate wo nodules or induration; will check pvr; pt defers any meds for now; if beomes bothered will call and we will trial flomax;   12/25/2012 office encounter  - bph surveillance, mild bother, pt defers meds in spite of stop and start nature to stream;   07/09/2013 office encounter  - bph surveillance, has enlarged prostate, tolerates his auass 12 ok; denies hematuria or dysuria;   09/27/2013 medical assistant encounter - psa increased to 4.44, spiked to 5.09 last fall so no particular worry here, pvr 80 cc, ua benign;     12/23/2013 office encounter  - bph /luts still some bother but not more so than his anxiety about pca and rising psa; his AUA symptom score (detailed in GU DATA/REVIEW OF LABS AND IMAGING below) is 18; \\  08/05/2014 office encounter  - bph surveillance, his AUA symptom score (detailed in GU DATA/REVIEW OF LABS AND IMAGING below) is 13, tolerates, denies gross hematuria or dysuria; ua dip and micro negative;  01/27/2015 office encounter  - denies gross hematuria or dysuria; ua dip and micro negative; his AUA symptom score (detailed in GU DATA/REVIEW OF LABS AND IMAGING below) is 18, offered alpha blocker pt declines      IBE /Weak Stream  POST VOID RESIDUAL  10/22/2007  04/21/2008  10/20/2008    PVR  72ml  58cc  310   11/19/2009 office encounter - auass 4, pvr 45 cc, pt wo bother   05/27/2010 office encounter  - auass 2, under influence of vesicare  12/06/2010 office encounter  - auass 7, has stopped vesicare, pvr 10cc  12/08/2011 office encounter  - auass 4 wo bother, no meds  06/18/2012 office encounter  - auass 9, stream a little slower; pvr 163 cc (initially 269, had pt double void and pvr 163 cc) will recheck pvr in a month;   12/25/2012 office encounter  - auass 13, not bothered, pvr 184 cc;   07/09/2013 office encounter  - auass 12, pvr 98 cc, defers meds  09/27/2013 medical assistant encounter - pvr 80 cc  12/23/2013 office encounter  - auass 18; pvr 32 cc, some bother  08/05/2014 office encounter  - auass 13, pt tolerates  01/27/2015 office encounter  - auass 18, declines tx; tolerates      Family Hx of Prostate Cancer   11/19/2009 office encounter - reports 3 brothers with pca  12/06/2010 office encounter  - now his 19 yo son has been diagnosed with pca;  12/25/2012 office encounter  -  Sig fam hx of pca;     PAST MEDICAL HISTORY    Past Surgical History   Procedure Laterality Date   ??? Hx orthopaedic     ??? Pr cardiac surg procedure unlist         Past Medical History   Diagnosis Date    ??? Benign localized hyperplasia of prostate with urinary obstruction and other lower urinary tract symptoms (LUTS)(600.21)    ??? BPH without obstruction/lower urinary tract symptoms    ??? Elevated PSA    ??? Family hx of prostate cancer    ??? Incomplete bladder emptying    ??? Urgency of urination    ??? Urgency of urination      Current Outpatient Prescriptions   Medication Sig Dispense Refill   ??? cyanocobalamin (VITAMIN B-12) 2,500 mcg sublingual tablet Take 2,500 mcg by mouth daily.     ??? simvastatin (ZOCOR) 20 mg tablet   1   ??? sertraline (ZOLOFT) 50 mg tablet   3   ??? OMEPRAZOLE (PRILOSEC PO) Take  by mouth.     ??? ipratropium (ATROVENT) 0.03 % nasal spray 2 Sprays every twelve (12) hours.     ??? acetaminophen (TYLENOL) 325 mg tablet Take  by mouth every four (4) hours as needed.     ??? montelukast (SINGULAIR) 10 mg tablet Take 10 mg by mouth daily.       ??? Cetirizine (ZYRTEC) 10 mg Cap Take  by mouth.       ??? aspirin delayed-release 81 mg tablet Take  by mouth daily.       ??? GUAIFENESIN (MUCINEX PO) Take  by mouth.         Allergies   Allergen Reactions   ??? Milk Contact Dermatitis   ??? Other Plant, Animal, Environmental Other (comments)     Pollens, trees, dust causes chronic rhinitis and congestion   ??? Sulfa (Sulfonamide Antibiotics) Unknown (comments)     Family History   Problem Relation Age of Onset   ??? Cancer     ??? Hypertension         Social  History   Smoking Status   ??? Never Smoker   Smokeless Tobacco   ??? Never Used     History   Alcohol Use   ??? 0.5 oz/week   ??? 1 Glasses of wine per week        REVIEW OF SYSTEMS  Constitutional: Fever: No  Skin: Rash: No  HEENT: Hearing difficulty: No  Eyes: Blurred vision: No  Cardiovascular: Chest pain: No  Respiratory: Shortness of breath: No  Gastrointestinal: Nausea/vomiting: No  Musculoskeletal: Back pain: No  Neurological: Weakness: No  Psychological: Memory loss: No  Comments/additional findings:      PHYSICAL EXAM  Visit Vitals   ??? BP 130/84   ??? Ht  (1.702 m)    ??? Wt 160 lb (72.6 kg)   ??? BMI 25.06 kg/m2      The BMI follow up plan is as follows: bmi ok    GENERAL APPEARANCE:  81 y.o. Caucasian  male alert, cooperative, no distress, appears stated age  not obese;  ABDOMEN:  flat soft, non-tender. Bowel sounds normal. No masses,  no organomegaly  BACK:  symmetric, no curvature. ROM normal. No CVA tenderness.  GENITOURINARY:    Penis:  normal, noncircumcised  Scrotum:  normal  Testes:  normal, no masses, size 20cc  Epididymides:  normal  Anus/Perineum:  within normal limits  Sphincter tone:  within normal limits  Rectum:  negative without mass, lesions or tenderness    Prostate:  35 gm benign prostate wo nodules or induration, left lobe maybe slightly larger than right  Seminal vesicles:  nonpalpable  LYMPHATIC:  Cervical, supraclavicular, and axillary nodes normal.      GU DATA/REVIEW OF LABS AND IMAGING    AUA Assessment Score:  AUA Score: 18;    AUA Bother Rating: Mostly satisified    AUA Symptom Score 01/27/2015   Over the past month how often have you had the sensation that your bladder was not completely empty after you finished urinating? 3   Over the past month, how often have had to urinate again less than 2 hours after you last finished urinating? 3   Over the past month, how often have you found you stopped and started again several times when you urinated? 5   Over the past month, how often have you found it difficult to postpone urination? 2   Over the past month, how often have you had a weak urinary stream? 3   Over the past month, how often have you had to push or strain to begin urinating? 2   Over the past month, how many times did you most typically get up to urinate from the time you went to bed at night until the time you got up in the morning? 0   AUA Score 18   If you were to spend the rest of your life with your urinary condition the way it is now, how would you feel about that? Mostly satisified        Results for orders placed or performed in visit on 01/19/15   PSA (TOTAL, REFLEX TO FREE)   Result Value Ref Range    Prostate Specific Ag 2.69 0.00 - 4.00 ng/mL   PSA (FREE)   Result Value Ref Range    PSA, Free 0.841 ng/mL    PSA, % Free 31 %     Results for orders placed or performed in visit on 01/27/15   AMB POC URINALYSIS DIP STICK AUTO W/ MICRO (MICRO RESULTS)   Result Value Ref Range    Color (UA POC) Yellow     Clarity (UA POC) Clear     Glucose (UA POC) Negative Negative    Bilirubin (UA POC) Negative Negative    Ketones (UA POC) Negative Negative    Specific gravity (UA POC) 1.015 1.001 - 1.035    Blood (UA POC) Negative Negative    pH (UA POC) 6.5 4.6 - 8.0    Protein (UA POC) Negative Negative mg/dL    Urobilinogen (UA POC) 0.2 mg/dL 0.2 - 1    Nitrites (UA POC) Negative Negative    Leukocyte esterase (UA POC) Negative Negative    Epithelial cells (UA POC) n     WBCs (UA POC) 0      RBCs (UA POC) 0      Bacteria (UA POC) None Negative    Crystals (UA POC) Negative Negative    Other (UA POC) n        Copy to Hernani  Lorenza Evangelist, MD and other appropriate care team members eFaxed today.     [name], MD  01/27/2015

## 2015-07-13 ENCOUNTER — Encounter: Primary: Family Medicine

## 2015-07-17 ENCOUNTER — Institutional Professional Consult (permissible substitution): Admit: 2015-07-17 | Discharge: 2015-07-17 | Payer: MEDICARE | Primary: Family Medicine

## 2015-07-17 DIAGNOSIS — R972 Elevated prostate specific antigen [PSA]: Secondary | ICD-10-CM

## 2015-07-17 NOTE — Progress Notes (Signed)
Jose BilletHarold Stafford presents today for lab draw per Dr. Nicanor AlconLasater order.    Patient will be notified by the physician with lab results.    PSA f/t obtained via venipuncture without any difficulty.      Orders Placed This Encounter   ??? PSA TOTAL+% FREE   ??? PR COLLECTION VENOUS BLOOD,VENIPUNCTURE       Trey Sailorseborah A Guerline Happ

## 2015-07-20 LAB — PSA TOTAL+% FREE
PSA, % Free: 30.4 %
PSA, Free: 0.73 ng/mL
Prostate Specific Ag: 2.4 ng/mL (ref 0.0–4.0)

## 2015-07-27 ENCOUNTER — Encounter: Attending: Urology | Primary: Family Medicine

## 2015-07-30 ENCOUNTER — Ambulatory Visit: Admit: 2015-07-30 | Discharge: 2015-07-30 | Payer: MEDICARE | Attending: Urology | Primary: Family Medicine

## 2015-07-30 DIAGNOSIS — N401 Enlarged prostate with lower urinary tract symptoms: Secondary | ICD-10-CM

## 2015-07-30 LAB — AMB POC URINALYSIS DIP STICK AUTO W/ MICRO (MICRO RESULTS)
Bilirubin (UA POC): NEGATIVE
Blood (UA POC): NEGATIVE
Crystals (UA POC): NEGATIVE
Epithelial cells (UA POC): 0
Glucose (UA POC): NEGATIVE
Ketones (UA POC): NEGATIVE
Leukocyte esterase (UA POC): NEGATIVE
Nitrites (UA POC): NEGATIVE
Protein (UA POC): NEGATIVE mg/dL
RBCs (UA POC): 0
Specific gravity (UA POC): 1.01 (ref 1.001–1.035)
Urobilinogen (UA POC): 0.2 (ref 0.2–1)
WBCs (UA POC): 0
pH (UA POC): 6 (ref 4.6–8.0)

## 2015-07-30 NOTE — Progress Notes (Addendum)
[name], MD  [facility]  [address]  [place], [place] [address]  [phone]    Patient:  Jose Stafford  DOB:  16-Oct-1927  Date of Service:  07/30/2015    ASSESSMENT/PLAN    Encounter Diagnoses   Name Primary?   ??? Benign non-nodular prostatic hyperplasia with lower urinary tract symptoms Yes   ??? History of elevated PSA    ??? Slowing of urinary stream    ??? Family hx of prostate cancer        Assessment:  Bph/luts wo bother, hx of elevated psa and fam hx of pca with stable normal psa and reassuring %free psa  Plan:  Pt insists on six month surveillance,       HISTORY OF PRESENT ILLNESS  He is currently 82 y.o. and his primary care provider is [name], MD.    Chief Complaint   Patient presents with   ??? Elevated PSA       Elevated PSA/Family Hx of PCA death   05/23/2007 psa 1.9; 081009 psa 2.3; 020810 psa 1.6; 132440 psa 2.2; 102725 psa 3.1; 366440 psa 2.9; 347425 psa 2.23; 956387 psa 2.5; 564332 psa 2.2; 951884 psa 2.2;  166063 psa 2.4; 100714 psa 5.09,%free psa 19%; 102814 psa 2.3,%free psa 33.5%; 016010 psa 2.78 %free psa 32%; 071715 psa 4.44; 093015 psa 5.81 %free psa 24%; 052416 psa 2.91 %free psa 32%; 932355 psa 2.4 %free psa 30%;       10/04/2009 chart entry - pt has fu appt with me in September having been referred by Dr Burnadette Peter per his retirement; 82 yo with normal psa but abnormal psa velocity; has bph;   11/19/2009 office encounter - Preexam psa pending, pt to call if hasn't heard psa report by two weeks; exam shows 30 gm prostate, l>r wo nodules; reassured pt no sign of cancer; copy of psa to pcp planned;   Psa 2.9, reassuring letter to patient, copy of report to Dr Donette Larry   05/20/2010 medical assistant encounter - psa draw 2.23;  appt near future  05/27/2010 office encounter  - elev psa surveillance in pt with family hx of sig pca; psa range is 1.9 - 3.1, present 2.23; pt anxious about his psa  and family hx wants six month checks; urine is micro benign; exam shows 30 gm prostate r>l wo nodules or induration; reassured pt no sign of prostate cancer but at risk due to 82 and family hx; reassured him pca death unlikely for him;   30-Dec-2010 medical assistant encounter - psa draw, reassuring stable value of 2.5; appt near future  12/06/2010 office encounter  - elevated psa hx in pt with strong fam hx of pca - brothers and his son and 3 nephews all with pca; reviewed his psa hx which is reassuring with stable value of 2.5; pt wo sxs; feels great - even though 82 he wishes active surveillance; exam shows 30 gm prostate r>l wo nodules or induration; will continue six month surveillance  06/06/2011 medical assistant encounter - psa 2.2, cw, let pt know his psa improved normal at 2.2; arrange appt with me in six months for " ua surveillance bph luts fam hx pca " - i recommend no psa testing; if he insists on psa testing, then get psa level two week prior;   12/08/2011 office encounter  - extensive fam hx of pca; here for fu exam, psa's have been normal recently so pt deferred psa testing;  exam shows 30 gm prostate R>L  wo nodules; continue six months surveillance  psa was drawn for some reason, psa 2.2; no more psa's, letter to patient  06/18/2012 office encounter  - pt insists on continued psa checks in addition to prostate exam due to strong family hx of pca death; exam shows 30 gm prostate l>r wo nodules or induration; psa currently is 2.4, reviewed psa hx with im, recheck in six months;   12/25/2012 office encounter  - pt concerned about psa jump from 2.4 to 5; has sig fam hx pca; appetite good, weight stable, no unusual bone or joint pains; 30 gm prostate l>r wo nodules or induration; will recheck f/t psa in thee months, as long as stable, then fu exam in six months with f/t psa  01/08/2013 medical assistant encounter - pt not willing to wait three  months for recheck psa, repeat psa 2.3 with reassuring %free psa 33.5%  01/14/2013 telephone encounter - debbie, call pt, im sure he already knows value but his repeat psa 2.3 and %free psa is normal 33.5%, i recommend no further psa testing but pt insists on psa testing; cancel planned appt in January for psa testing; ask him when he wants his next psa done? Likely arrange nurse appt mid April for f/t psa and exam with me late April for " pvr/ua surveillance fam hx pca bph luts ibe (confirm f/t psa prior) "   06/12/2013 medical assistant encounter - psa normal 2.78 %free psa 32%; pt has an invalid appt for april 8th, this was missed on checkout april 1st and not rescheduled to a valid date and appt time; Florentina Addisonkatie, please make sure this gets changed on Monday apirl 6th so pt doesn't come to office expecting to be seen on Wednesday  07/09/2013 office encounter  - current psa 2.78, pt with fam hx of pca in two brothers, one son with pca, one died of pca; pt has high anxiety about pca; reviewed his psa hx in detail with him, asided from transient spike last october, psa have been very reassuringly normal and with reassuring %free psa; 3xam today shows 35 gm benign prostate wo nodules or induration; pt wants continued six month surveillance with f/t psa and exam;   09/27/2013 medical assistant encounter - psa increased to 4.44, spiked to 5.09 last fall so no particular worry here, pvr 80 cc, ua benign;    09/30/2013 telephone encounter - debbie, not sure why he came in for psa at this time - ask him reason when you call him; yes, his psa did bump up a bit to 4.44 but that is not concerning, had spike last fall to 5.09; he has appts for f/t psa in late september and fu exam mid october; also let him know his urine ok and pvr ok;   12/23/2013 office encounter  - psa has increased to 5.81 with favorable %free psa 24% - have finally reached his level of intolerance of my  recommended ww/active surveillance; pt has fam hx of pca death and is very apprehensive about this rise in psa; has seen shellhammer and lynch in past; discussed options - he understands risks of nbxp including death from sepsis; he would like to see oncologist; will arrange for him to see Dr Given; offered him 12 core biopsy today; discussed targeted biopsy but best to go with 12 core first; happy to see him in fu and do biopsy pending his discussion with Dr Given; pt understands reluctance to proceed with  radiation therapy at age 63 with its risks and complications; certainly would not entertain surgery; will see what Dr Given has to say.  Exam today shows 35 gm benign prostate wo nodules or induration, left lobe maybe slightly larger than right   [id number] GU ONC CONSULT GIVEN notes IMPRESSION: 1. Variable PSA, highest of 5.81ng/ml in 11/2013 and most recently 3.3ng/ml in 12/2013. Has been on active surveillance in the past with Dr. Nicanor Alcon but requested consultation with oncology. I advised pt at his age that he should not get too excited about his PSA, in fact the guidelines would not advise further screening. 2. BPH with mild LUTS, specifically weak FOS, followed by Dr. Nicanor Alcon PLAN: 1. FU with Dr. Nicanor Alcon q 6 months as planned   08/05/2014 office encounter  - Preexam F/T PSA pending, pt to call if hasn't heard F/T PSA report by two weeks; sig fam hx of pca; exam shows 40 gm benign prostate wo nodules or induration; will make fu plans pending current psa results  psa 2.91, %free psa 32%; letter to patient and results to pcp, gave pt choice of fu six months or a year.   01/27/2015 office encounter  - hx of elev psa, fam hx pca, pt has high anxiety, current psa favorable 2.69 and %free psa favorable 31%; urine benign; exam shows 35 gm prostate L>R wo nodules or induration; pt wants six month surveillance   07/30/2015 office encounter  - hx of elev psa surveillance and fam hx of  pca; pt has high anxiety regarding pca and insists on surveillance; , current psa 2.4 %free psa 30%; urine benign, exam shows 30 gm benign prostate l>r wo nodules or induration; pt wants six month surveillance      BPH w LUTS   Early 2000's microwave treatment - successful   [id number] Burnadette Peter) notes This is a follow-up visit for [name] who has an established diagnosis of BPH with obstruction. There has been improvement of his symptoms. He reports nocturia one time a night. His current medications includes no medications. He has not experienced Problems when on these medications. Since his last visit he notes less frequency less nocturia less urgency   [id number] Burnadette Peter) notes There has been improvement of his symptoms. He reports nocturia is rare now. He is bothered by some daytime urgency, which has become more noticeable over the past 6 mos. His current medications includes no medications. Since his last visit he notes less frequency less nocturia But more daytime urgency; PLAN Sanctura trial   There has been further improvement of his symptoms. He reports nocturia is rare now, and not more than once nightly. He was bothered by some daytime urgency, but began Sanctura with good improvement.   [id number] Burnadette Peter) notes His current medications includes no BPH medications. He continues on Reunion, with good results.   [id number] Burnadette Peter) notes There has been improvement of his symptoms. He reports nocturia one or two times a night. He is emptying well, with a PVR of 310 cc today. He is reasonably happy with his voiding despite this. His current medications includes Sanctura 60 mgm qHS. He has not experienced side effects on this medication.   [id number] [name]) notes irritable sxs controlled with sanctura, notes 40 gm benign prostate   10/04/2009 chart entry - chart reviewed, note pt has rx for sudafed and is on sanctura with 40 gm prostate - will discuss impact of this rx on bph sxs and increased pvr;    11/19/2009 office encounter -  bph surveillance, reviewed risk of sudafed with bph - uses rarely; remains on sanctura xr on empty stomach, pleased with bladder control; discussed trial of cessation if he would like since doing; denies hematuria or dysuria; urine is dip and micro negative, exam shows 30 gm benign prostate l>r wo Nodules; ok to trial cesation of sanctura if he'd like   05/01/2010 chart entry ??? changed insurance needs alternative for sanctura since not on his insurance approved list, meds listed include oxybutynin, oxybutynin er 5, 10, 15, enablex, gelnique, vesicare; will go with vesicare 10 mg #30 [id number], sent to [facility] pharmacy  05/06/2010 chart entry ??? vesicare too expensive, pt wants generic, pt advised of short term memory loss associated with oxybutynin; Rx oxbutynin er 10 mg daily #30 [id number]  05/27/2010 office encounter  - pt opted to stay w vesicare and avoid side effects with the oxybutynin; his AUA symptom score (detailed in the documents flowsheet) is only 2 on vesicare; pt pleased; will titrate dose to every other and then every third; whatever minimum dosage is effective;   12/06/2010 office encounter  - weaned off vesicare, his AUA symptom score (detailed in the documents flowsheet) is 7; denies hematuria or dysuria; ua dip and micro negative;   12/08/2011 office encounter  - bph surveillance, no meds; pt satisfied with voiding; his AUA symptom score (detailed in the documents flowsheet) is 4, denies hematuria or dysuria; ua dip and micro negative; occl urgency  06/18/2012 office encounter  - not on any meds right now, stream a little slower; his AUA symptom score (detailed in the documents flowsheet) is 9; denies hematuria or dysuria; ua dip and micro negative; exam shows 30 gm benign prostate wo nodules or induration; will check pvr; pt defers any meds for now; if beomes bothered will call and we will trial flomax;   12/25/2012 office encounter  - bph surveillance, mild bother, pt defers  meds in spite of stop and start nature to stream;   07/09/2013 office encounter  - bph surveillance, has enlarged prostate, tolerates his auass 12 ok; denies hematuria or dysuria;   09/27/2013 medical assistant encounter - psa increased to 4.44, spiked to 5.09 last fall so no particular worry here, pvr 80 cc, ua benign;    12/23/2013 office encounter  - bph /luts still some bother but not more so than his anxiety about pca and rising psa; his AUA symptom score (detailed in GU DATA/REVIEW OF LABS AND IMAGING below) is 18; \\  08/05/2014 office encounter  - bph surveillance, his AUA symptom score (detailed in GU DATA/REVIEW OF LABS AND IMAGING below) is 13, tolerates, denies gross hematuria or dysuria; ua dip and micro negative;  01/27/2015 office encounter  - denies gross hematuria or dysuria; ua dip and micro negative; his AUA symptom score (detailed in GU DATA/REVIEW OF LABS AND IMAGING below) is 18, offered alpha blocker pt declines  07/30/2015 office encounter  - bph surveillance, nlo meds and satisfied with voidning; his AUA symptom score (detailed in GU DATA/REVIEW OF LABS AND IMAGING below) is 11, denies hematuria or dysuria; ua dip and micro negative;      IBE /Weak Stream  POST VOID RESIDUAL  10/22/2007  04/21/2008  10/20/2008    PVR  72ml  58cc  310   11/19/2009 office encounter - auass 4, pvr 45 cc, pt wo bother   05/27/2010 office encounter  - auass 2, under influence of vesicare  12/06/2010 office encounter  - auass 7, has stopped vesicare,  pvr 10cc  12/08/2011 office encounter  - auass 4 wo bother, no meds  06/18/2012 office encounter  - auass 9, stream a little slower; pvr 163 cc (initially 269, had pt double void and pvr 163 cc) will recheck pvr in a month;   12/25/2012 office encounter  - auass 13, not bothered, pvr 184 cc;   07/09/2013 office encounter  - auass 12, pvr 98 cc, defers meds  09/27/2013 medical assistant encounter - pvr 80 cc  12/23/2013 office encounter  - auass 18; pvr 32 cc, some bother   08/05/2014 office encounter  - auass 13, pt tolerates  01/27/2015 office encounter  - auass 18, declines tx; tolerates  07/30/2015 office encounter  - auass 11, no bother      Family Hx of Prostate Cancer   11/19/2009 office encounter - reports 3 brothers with pca  12/06/2010 office encounter  - now his 10 yo son has been diagnosed with pca;  12/25/2012 office encounter  -  Sig fam hx of pca;     PAST MEDICAL HISTORY    Past Surgical History:   Procedure Laterality Date   ??? CARDIAC SURG PROCEDURE UNLIST     ??? HX ORTHOPAEDIC         Past Medical History:   Diagnosis Date   ??? Benign localized hyperplasia of prostate with urinary obstruction and other lower urinary tract symptoms (LUTS)    ??? BPH without obstruction/lower urinary tract symptoms    ??? Elevated PSA    ??? Family hx of prostate cancer    ??? Incomplete bladder emptying    ??? Urgency of urination    ??? Urgency of urination      Current Outpatient Prescriptions   Medication Sig Dispense Refill   ??? cyanocobalamin (VITAMIN B-12) 2,500 mcg sublingual tablet Take 2,500 mcg by mouth daily.     ??? simvastatin (ZOCOR) 20 mg tablet   1   ??? sertraline (ZOLOFT) 50 mg tablet   3   ??? OMEPRAZOLE (PRILOSEC PO) Take  by mouth.     ??? ipratropium (ATROVENT) 0.03 % nasal spray 2 Sprays every twelve (12) hours.     ??? acetaminophen (TYLENOL) 325 mg tablet Take  by mouth every four (4) hours as needed.     ??? montelukast (SINGULAIR) 10 mg tablet Take 10 mg by mouth daily.       ??? Cetirizine (ZYRTEC) 10 mg Cap Take  by mouth.       ??? aspirin delayed-release 81 mg tablet Take  by mouth daily.       ??? GUAIFENESIN (MUCINEX PO) Take  by mouth.         Allergies   Allergen Reactions   ??? Milk Contact Dermatitis   ??? Other Plant, Animal, Environmental Other (comments)     Pollens, trees, dust causes chronic rhinitis and congestion   ??? Sulfa (Sulfonamide Antibiotics) Unknown (comments)   ??? Tolmetin Other (comments)     Family History   Problem Relation Age of Onset   ??? Cancer     ??? Hypertension          Social  History   Smoking Status   ??? Never Smoker   Smokeless Tobacco   ??? Never Used     History   Alcohol Use   ??? 0.5 oz/week   ??? 1 Glasses of wine per week        REVIEW OF SYSTEMS  Constitutional: Fever: No  Skin: Rash: No  HEENT: Hearing difficulty: No  Eyes: Blurred vision: No  Cardiovascular: Chest pain: No  Respiratory: Shortness of breath: No  Gastrointestinal: Nausea/vomiting: No  Musculoskeletal: Back pain: No  Neurological: Weakness: No  Psychological: Memory loss: No  Comments/additional findings:      PHYSICAL EXAM  Visit Vitals   ??? BP 110/50   ??? Ht  (1.702 m)   ??? Wt 159 lb (72.1 kg)   ??? BMI 24.9 kg/m2     GENERAL APPEARANCE:  82 y.o. Caucasian  male alert, cooperative, no distress, appears stated age not obese;  ABDOMEN:  flat soft, non-tender. Bowel sounds normal. No masses,  no organomegaly  BACK:  symmetric, no curvature. ROM normal. No CVA tenderness.  GENITOURINARY:    Penis:  normal, noncircumcised  Scrotum:  normal  Testes:  normal, no masses, size 20cc  Epididymides:  normal  Anus/Perineum:  within normal limits  Sphincter tone:  within normal limits  Rectum:  negative without mass, lesions or tenderness    Prostate:  30 gm benign prostate wo nodules or induration, left lobe maybe slightly larger than right  Seminal vesicles:  nonpalpable  LYMPHATIC:  Cervical, supraclavicular, and axillary nodes normal.      GU DATA/REVIEW OF LABS AND IMAGING    AUA Assessment Score:  AUA Score: 11;    AUA Bother Rating: Pleased    AUA Symptom Score 07/30/2015   Over the past month how often have you had the sensation that your bladder was not completely empty after you finished urinating? 2   Over the past month, how often have had to urinate again less than 2 hours after you last finished urinating? 3   Over the past month, how often have you found you stopped and started again several times when you urinated? 3   Over the past month, how often have you found it difficult to postpone urination? 0    Over the past month, how often have you had a weak urinary stream? 3   Over the past month, how often have you had to push or strain to begin urinating? 0   Over the past month, how many times did you most typically get up to urinate from the time you went to bed at night until the time you got up in the morning? 0   AUA Score 11   If you were to spend the rest of your life with your urinary condition the way it is now, how would you feel about that? Pleased       Results for orders placed or performed in visit on 07/30/15   AMB POC URINALYSIS DIP STICK AUTO W/ MICRO (MICRO RESULTS)   Result Value Ref Range    Color (UA POC) Yellow     Clarity (UA POC) Clear     Glucose (UA POC) Negative Negative    Bilirubin (UA POC) Negative Negative    Ketones (UA POC) Negative Negative    Specific gravity (UA POC) 1.010 1.001 - 1.035    Blood (UA POC) Negative Negative    pH (UA POC) 6.0 4.6 - 8.0    Protein (UA POC) Negative Negative mg/dL    Urobilinogen (UA POC) 0.2 mg/dL 0.2 - 1    Nitrites (UA POC) Negative Negative    Leukocyte esterase (UA POC) Negative Negative    Epithelial cells (UA POC) 0     WBCs (UA POC) 0      RBCs (UA POC) 0  Bacteria (UA POC) None Negative    Crystals (UA POC) Negative Negative    Other (UA POC) none        Copy to [name], MD and other appropriate care team members eFaxed today.     [name], MD  07/30/2015

## 2015-08-04 ENCOUNTER — Inpatient Hospital Stay: Admit: 2015-08-04 | Payer: PRIVATE HEALTH INSURANCE | Attending: Podiatrist | Primary: Family Medicine

## 2015-08-04 ENCOUNTER — Encounter

## 2015-08-04 DIAGNOSIS — Z01818 Encounter for other preprocedural examination: Secondary | ICD-10-CM

## 2015-08-04 LAB — METABOLIC PANEL, BASIC
Anion gap: 7 mmol/L (ref 3.0–18)
BUN/Creatinine ratio: 18 (ref 12–20)
BUN: 19 MG/DL — ABNORMAL HIGH (ref 7.0–18)
CO2: 28 mmol/L (ref 21–32)
Calcium: 8.1 MG/DL — ABNORMAL LOW (ref 8.5–10.1)
Chloride: 105 mmol/L (ref 100–108)
Creatinine: 1.03 MG/DL (ref 0.6–1.3)
GFR est AA: >60 mL/min/{1.73_m2} (ref >60)
GFR est non-AA: >60 mL/min/{1.73_m2} (ref >60)
Glucose: 80 mg/dL (ref 74–99)
Potassium: 4.3 mmol/L (ref 3.5–5.5)
Sodium: 140 mmol/L (ref 136–145)

## 2015-08-04 LAB — CBC WITH AUTOMATED DIFF
ABS. BASOPHILS: 0.1 10*3/uL — ABNORMAL HIGH (ref 0.0–0.06)
ABS. EOSINOPHILS: 0.2 10*3/uL (ref 0.0–0.4)
ABS. LYMPHOCYTES: 1.2 10*3/uL (ref 0.9–3.6)
ABS. MONOCYTES: 0.6 10*3/uL (ref 0.05–1.2)
ABS. NEUTROPHILS: 3.7 10*3/uL (ref 1.8–8.0)
BASOPHILS: 1 % (ref 0–2)
EOSINOPHILS: 4 % (ref 0–5)
HCT: 34.7 % — ABNORMAL LOW (ref 36.0–48.0)
HGB: 11.6 g/dL — ABNORMAL LOW (ref 13.0–16.0)
LYMPHOCYTES: 21 % (ref 21–52)
MCH: 30.1 PG (ref 24.0–34.0)
MCHC: 33.4 g/dL (ref 31.0–37.0)
MCV: 90.1 FL (ref 74.0–97.0)
MONOCYTES: 10 % (ref 3–10)
MPV: 8.1 FL — ABNORMAL LOW (ref 9.2–11.8)
NEUTROPHILS: 64 % (ref 40–73)
PLATELET: 162 10*3/uL (ref 135–420)
RBC: 3.85 M/uL — ABNORMAL LOW (ref 4.70–5.50)
RDW: 13.2 % (ref 11.6–14.5)
WBC: 5.8 10*3/uL (ref 4.6–13.2)

## 2015-08-05 LAB — MIH FAX RESULT: FAX TO NUMBER: 8330062

## 2015-08-13 ENCOUNTER — Inpatient Hospital Stay: Admit: 2015-08-14 | Payer: PRIVATE HEALTH INSURANCE | Primary: Family Medicine

## 2015-08-13 DIAGNOSIS — M2041 Other hammer toe(s) (acquired), right foot: Secondary | ICD-10-CM

## 2016-01-27 ENCOUNTER — Encounter: Primary: Family Medicine

## 2016-02-02 ENCOUNTER — Institutional Professional Consult (permissible substitution): Admit: 2016-02-02 | Discharge: 2016-02-02 | Payer: MEDICARE | Primary: Family Medicine

## 2016-02-02 DIAGNOSIS — R972 Elevated prostate specific antigen [PSA]: Secondary | ICD-10-CM

## 2016-02-02 NOTE — Progress Notes (Signed)
Jose BilletHarold Stafford presents today for lab draw per Dr. Nicanor AlconLasater order.    Results for orders placed or performed in visit on 02/02/16   PSA TOTAL+% FREE   Result Value Ref Range    Prostate Specific Ag 3.0 0.0 - 4.0 ng/mL    PSA, Free 1.00 N/A ng/mL    PSA, % Free 33.3 %       Patient will be notified by the physician with lab results.    PSA obtained via venipuncture without any difficulty.      Orders Placed This Encounter   ??? PSA TOTAL+% FREE   ??? PR COLLECTION VENOUS BLOOD,VENIPUNCTURE

## 2016-02-04 LAB — PSA TOTAL+% FREE
PSA, % Free: 33.3 %
PSA, Free: 1 ng/mL
Prostate Specific Ag: 3 ng/mL (ref 0.0–4.0)

## 2016-02-10 ENCOUNTER — Encounter: Attending: Urology | Primary: Family Medicine

## 2016-02-16 ENCOUNTER — Encounter: Attending: Urology | Primary: Family Medicine

## 2016-02-25 ENCOUNTER — Ambulatory Visit: Admit: 2016-02-25 | Discharge: 2016-02-25 | Payer: MEDICARE | Attending: Urology | Primary: Family Medicine

## 2016-02-25 DIAGNOSIS — N401 Enlarged prostate with lower urinary tract symptoms: Secondary | ICD-10-CM

## 2016-02-25 LAB — AMB POC URINALYSIS DIP STICK AUTO W/ MICRO (MICRO RESULTS)
Blood (UA POC): NEGATIVE
Glucose (UA POC): NEGATIVE
Ketones (UA POC): NEGATIVE
Leukocyte esterase (UA POC): NEGATIVE
Nitrites (UA POC): NEGATIVE
RBCs (UA POC): 0
Specific gravity (UA POC): 1.02 (ref 1.001–1.035)
Urobilinogen (UA POC): 0.2 (ref 0.2–1)
WBCs (UA POC): 0
pH (UA POC): 5.5 (ref 4.6–8.0)

## 2016-02-25 NOTE — Progress Notes (Signed)
Jose HeysJohn D Domingue Coltrain, MD  [facility]  [address], [address]  [place], [place] [id number]  [phone]    Patient:  Jose BilletHarold Stafford  DOB:  [birth year]  Date of Service:  02/25/2016    ASSESSMENT/PLAN    Encounter Diagnoses   Name Primary?   ??? Benign prostatic hyperplasia with lower urinary tract symptoms, symptom details unspecified Yes   ??? Urgency of urination    ??? Family hx of prostate cancer    ??? History of elevated PSA        Assessment:  bph w luts tolerated, fam hx of pca death and pt remains anxious regarding prostate cancer risk, psa normal stable and exam normal stable  Plan:  Reassured pt that no sign of pca risk other than fam hx of pca - pt wishes six month surveillance;       HISTORY OF PRESENT ILLNESS  He is currently 82 y.o. and his primary care provider is Talbert CageHernani A Valerio, MD.    Chief Complaint   Patient presents with   ??? Elevated PSA       Elevated PSA/Family Hx of PCA death   020909 psa 1.9; 081009 psa 2.3; 020810 psa 1.6; [id number] psa 2.2; [id number] psa 3.1; [id number] psa 2.9; [id number] psa 2.23; [id number] psa 2.5; [id number] psa 2.2; [id number] psa 2.2;  [id number] psa 2.4; 100714 psa 5.09,%free psa 19%; 102814 psa 2.3,%free psa 33.5%; [id number] psa 2.78 %free psa 32%; 071715 psa 4.44; 093015 psa 5.81 %free psa 24%; 052416 psa 2.91 %free psa 32%; [id number] psa 2.4 %free psa 30%; 110117 psa 3.0, %free psa 33%;        10/04/2009 chart entry - pt has fu appt with me in September having been referred by Dr Burnadette PeterLynch per his retirement; 82 yo with normal psa but abnormal psa velocity; has bph;   11/19/2009 office encounter - Preexam psa pending, pt to call if hasn't heard psa report by two weeks; exam shows 30 gm prostate, l>r wo nodules; reassured pt no sign of cancer; copy of psa to pcp planned;   Psa 2.9, reassuring letter to patient, copy of report to Dr Donette LarrySharp-Warthan   05/20/2010 medical assistant encounter - psa draw 2.23;  appt near future  05/27/2010 office encounter  - elev psa surveillance in pt with family hx  of sig pca; psa range is 1.9 - 3.1, present 2.23; pt anxious about his psa and family hx wants six month checks; urine is micro benign; exam shows 30 gm prostate r>l wo nodules or induration; reassured pt no sign of prostate cancer but at risk due to age and family hx; reassured him pca death unlikely for him;   11/30/2010 medical assistant encounter - psa draw, reassuring stable value of 2.5; appt near future  12/06/2010 office encounter  - elevated psa hx in pt with strong fam hx of pca - brothers and his son and 3 nephews all with pca; reviewed his psa hx which is reassuring with stable value of 2.5; pt wo sxs; feels great - even though 7381 he wishes active surveillance; exam shows 30 gm prostate r>l wo nodules or induration; will continue six month surveillance  06/06/2011 medical assistant encounter - psa 2.2, cw, let pt know his psa improved normal at 2.2; arrange appt with me in six months for " ua surveillance bph luts fam hx pca " - i recommend no psa testing; if he insists on psa testing, then get psa level two week prior;  12/08/2011 office encounter  - extensive fam hx of pca; here for fu exam, psa's have been normal recently so pt deferred psa testing; exam shows 30 gm prostate R>L  wo nodules; continue six months surveillance  psa was drawn for some reason, psa 2.2; no more psa's, letter to patient  06/18/2012 office encounter  - pt insists on continued psa checks in addition to prostate exam due to strong family hx of pca death; exam shows 30 gm prostate l>r wo nodules or induration; psa currently is 2.4, reviewed psa hx with im, recheck in six months;   12/25/2012 office encounter  - pt concerned about psa jump from 2.4 to 5; has sig fam hx pca; appetite good, weight stable, no unusual bone or joint pains; 30 gm prostate l>r wo nodules or induration; will recheck f/t psa in thee months, as long as stable, then fu exam in six months with f/t psa   01/08/2013 medical assistant encounter - pt not willing to wait three months for recheck psa, repeat psa 2.3 with reassuring %free psa 33.5%  01/14/2013 telephone encounter - debbie, call pt, im sure he already knows value but his repeat psa 2.3 and %free psa is normal 33.5%, i recommend no further psa testing but pt insists on psa testing; cancel planned appt in January for psa testing; ask him when he wants his next psa done? Likely arrange nurse appt mid April for f/t psa and exam with me late April for " pvr/ua surveillance fam hx pca bph luts ibe (confirm f/t psa prior) "   06/12/2013 medical assistant encounter - psa normal 2.78 %free psa 32%; pt has an invalid appt for april 8th, this was missed on checkout april 1st and not rescheduled to a valid date and appt time; [name], please make sure this gets changed on Monday apirl 6th so pt doesn't come to office expecting to be seen on Wednesday  07/09/2013 office encounter  - current psa 2.78, pt with fam hx of pca in two brothers, one son with pca, one died of pca; pt has high anxiety about pca; reviewed his psa hx in detail with him, asided from transient spike last october, psa have been very reassuringly normal and with reassuring %free psa; 3xam today shows 35 gm benign prostate wo nodules or induration; pt wants continued six month surveillance with f/t psa and exam;   09/27/2013 medical assistant encounter - psa increased to 4.44, spiked to 5.09 last fall so no particular worry here, pvr 80 cc, ua benign;    09/30/2013 telephone encounter - debbie, not sure why he came in for psa at this time - ask him reason when you call him; yes, his psa did bump up a bit to 4.44 but that is not concerning, had spike last fall to 5.09; he has appts for f/t psa in late september and fu exam mid october; also let him know his urine ok and pvr ok;   12/23/2013 office encounter  - psa has increased to 5.81 with favorable  %free psa 24% - have finally reached his level of intolerance of my recommended ww/active surveillance; pt has fam hx of pca death and is very apprehensive about this rise in psa; has seen shellhammer and lynch in past; discussed options - he understands risks of nbxp including death from sepsis; he would like to see oncologist; will arrange for him to see Dr Given; offered him 12 core biopsy today; discussed targeted biopsy but best to go with  12 core first; happy to see him in fu and do biopsy pending his discussion with Dr Given; pt understands reluctance to proceed with radiation therapy at age 82 with its risks and complications; certainly would not entertain surgery; will see what Dr Given has to say.  Exam today shows 35 gm benign prostate wo nodules or induration, left lobe maybe slightly larger than right   [id number] GU ONC CONSULT GIVEN notes IMPRESSION: 1. Variable PSA, highest of 5.81ng/ml in 11/2013 and most recently 3.3ng/ml in 12/2013. Has been on active surveillance in the past with Dr. Nicanor Alcon but requested consultation with oncology. I advised pt at his age that he should not get too excited about his PSA, in fact the guidelines would not advise further screening. 2. BPH with mild LUTS, specifically weak FOS, followed by Dr. Nicanor Alcon PLAN: 1. FU with Dr. Nicanor Alcon q 6 months as planned   08/05/2014 office encounter  - Preexam F/T PSA pending, pt to call if hasn't heard F/T PSA report by two weeks; sig fam hx of pca; exam shows 40 gm benign prostate wo nodules or induration; will make fu plans pending current psa results  psa 2.91, %free psa 32%; letter to patient and results to pcp, gave pt choice of fu six months or a year.   01/27/2015 office encounter  - hx of elev psa, fam hx pca, pt has high anxiety, current psa favorable 2.69 and %free psa favorable 31%; urine benign; exam shows 35 gm prostate L>R wo nodules or induration; pt wants six month surveillance    07/30/2015 office encounter  - hx of elev psa surveillance and fam hx of pca; pt has high anxiety regarding pca and insists on surveillance; , current psa 2.4 %free psa 30%; urine benign, exam shows 30 gm benign prostate l>r wo nodules or induration; pt wants six month surveillance  02/25/2016 office encounter  - continues with personal requested six month surveillance with exam and psa due to fam hx pca death; current psa stable 3.0 with reassuring %free psa 33%; denies hematuria or dysuria; ua dip and micro negative; exam shows 30 gm benign prostate wo nodules or induration; reassured pt no sign of pca risk other than his fam hx;       BPH w LUTS   Early 2000's microwave treatment - successful   [id number] Burnadette Peter) notes This is a follow-up visit for Jose Stafford who has an established diagnosis of BPH with obstruction. There has been improvement of his symptoms. He reports nocturia one time a night. His current medications includes no medications. He has not experienced Problems when on these medications. Since his last visit he notes less frequency less nocturia less urgency   [id number] Burnadette Peter) notes There has been improvement of his symptoms. He reports nocturia is rare now. He is bothered by some daytime urgency, which has become more noticeable over the past 6 mos. His current medications includes no medications. Since his last visit he notes less frequency less nocturia But more daytime urgency; PLAN Sanctura trial   There has been further improvement of his symptoms. He reports nocturia is rare now, and not more than once nightly. He was bothered by some daytime urgency, but began Sanctura with good improvement.   [id number] Burnadette Peter) notes His current medications includes no BPH medications. He continues on Reunion, with good results.   [id number] Burnadette Peter) notes There has been improvement of his symptoms. He reports nocturia one or two times a night. He is emptying well, with  a PVR  of 310 cc today. He is reasonably happy with his voiding despite this. His current medications includes Sanctura 60 mgm qHS. He has not experienced side effects on this medication.   [id number] [name]) notes irritable sxs controlled with sanctura, notes 40 gm benign prostate   10/04/2009 chart entry - chart reviewed, note pt has rx for sudafed and is on sanctura with 40 gm prostate - will discuss impact of this rx on bph sxs and increased pvr;   11/19/2009 office encounter - bph surveillance, reviewed risk of sudafed with bph - uses rarely; remains on sanctura xr on empty stomach, pleased with bladder control; discussed trial of cessation if he would like since doing; denies hematuria or dysuria; urine is dip and micro negative, exam shows 30 gm benign prostate l>r wo Nodules; ok to trial cesation of sanctura if he'd like   05/01/2010 chart entry ??? changed insurance needs alternative for sanctura since not on his insurance approved list, meds listed include oxybutynin, oxybutynin er 5, 10, 15, enablex, gelnique, vesicare; will go with vesicare 10 mg #30 rfx11, sent to [facility] pharmacy  05/06/2010 chart entry ??? vesicare too expensive, pt wants generic, pt advised of short term memory loss associated with oxybutynin; Rx oxbutynin er 10 mg daily #30 rfx11  05/27/2010 office encounter  - pt opted to stay w vesicare and avoid side effects with the oxybutynin; his AUA symptom score (detailed in the documents flowsheet) is only 2 on vesicare; pt pleased; will titrate dose to every other and then every third; whatever minimum dosage is effective;   12/06/2010 office encounter  - weaned off vesicare, his AUA symptom score (detailed in the documents flowsheet) is 7; denies hematuria or dysuria; ua dip and micro negative;   12/08/2011 office encounter  - bph surveillance, no meds; pt satisfied with voiding; his AUA symptom score (detailed in the documents flowsheet) is 4,  denies hematuria or dysuria; ua dip and micro negative; occl urgency  06/18/2012 office encounter  - not on any meds right now, stream a little slower; his AUA symptom score (detailed in the documents flowsheet) is 9; denies hematuria or dysuria; ua dip and micro negative; exam shows 30 gm benign prostate wo nodules or induration; will check pvr; pt defers any meds for now; if beomes bothered will call and we will trial flomax;   12/25/2012 office encounter  - bph surveillance, mild bother, pt defers meds in spite of stop and start nature to stream;   07/09/2013 office encounter  - bph surveillance, has enlarged prostate, tolerates his auass 12 ok; denies hematuria or dysuria;   09/27/2013 medical assistant encounter - psa increased to 4.44, spiked to 5.09 last fall so no particular worry here, pvr 80 cc, ua benign;    12/23/2013 office encounter  - bph /luts still some bother but not more so than his anxiety about pca and rising psa; his AUA symptom score (detailed in GU DATA/REVIEW OF LABS AND IMAGING below) is 18; \\  08/05/2014 office encounter  - bph surveillance, his AUA symptom score (detailed in GU DATA/REVIEW OF LABS AND IMAGING below) is 13, tolerates, denies gross hematuria or dysuria; ua dip and micro negative;  01/27/2015 office encounter  - denies gross hematuria or dysuria; ua dip and micro negative; his AUA symptom score (detailed in GU DATA/REVIEW OF LABS AND IMAGING below) is 18, offered alpha blocker pt declines  07/30/2015 office encounter  - bph surveillance, nlo meds and satisfied with voidning; his  AUA symptom score (detailed in GU DATA/REVIEW OF LABS AND IMAGING below) is 11, denies hematuria or dysuria; ua dip and micro negative;  02/25/2016 office encounter  - bph surveillance, his AUA symptom score (detailed in GU DATA/REVIEW OF LABS AND IMAGING below) is 9 denies hematuria or dysuria; urine shows clear;       IBE /Weak Stream  POST VOID RESIDUAL  10/22/2007  04/21/2008  10/20/2008     PVR  72ml  58cc  310   11/19/2009 office encounter - auass 4, pvr 45 cc, pt wo bother   06/18/2012 office encounter  - auass 9, stream a little slower; pvr 163 cc (initially 269, had pt double void and pvr 163 cc) will recheck pvr in a month;   12/25/2012 office encounter  - auass 13, not bothered, pvr 184 cc;   07/09/2013 office encounter  - auass 12, pvr 98 cc, defers meds  09/27/2013 medical assistant encounter - pvr 80 cc  12/23/2013 office encounter  - auass 18; pvr 32 cc, some bother  02/25/2016 office encounter  - auass 9, no bother, no meds;       Family Hx of Prostate Cancer   11/19/2009 office encounter - reports 3 brothers with pca  12/06/2010 office encounter  - now his 49 yo son has been diagnosed with pca;  12/25/2012 office encounter  -  Sig fam hx of pca;     PAST MEDICAL HISTORY    Past Surgical History:   Procedure Laterality Date   ??? CARDIAC SURG PROCEDURE UNLIST     ??? HX ORTHOPAEDIC         Past Medical History:   Diagnosis Date   ??? Benign localized hyperplasia of prostate with urinary obstruction and other lower urinary tract symptoms (LUTS)(600.21)    ??? BPH without obstruction/lower urinary tract symptoms    ??? Elevated PSA    ??? Family hx of prostate cancer    ??? Incomplete bladder emptying    ??? Urgency of urination    ??? Urgency of urination      Current Outpatient Prescriptions   Medication Sig Dispense Refill   ??? cyanocobalamin (VITAMIN B-12) 2,500 mcg sublingual tablet Take 2,500 mcg by mouth daily.     ??? simvastatin (ZOCOR) 20 mg tablet   1   ??? sertraline (ZOLOFT) 50 mg tablet   3   ??? OMEPRAZOLE (PRILOSEC PO) Take  by mouth.     ??? ipratropium (ATROVENT) 0.03 % nasal spray 2 Sprays every twelve (12) hours.     ??? acetaminophen (TYLENOL) 325 mg tablet Take  by mouth every four (4) hours as needed.     ??? montelukast (SINGULAIR) 10 mg tablet Take 10 mg by mouth daily.       ??? Cetirizine (ZYRTEC) 10 mg Cap Take  by mouth.       ??? aspirin delayed-release 81 mg tablet Take  by mouth daily.        ??? GUAIFENESIN (MUCINEX PO) Take  by mouth.         Allergies   Allergen Reactions   ??? Milk Contact Dermatitis   ??? Other Plant, Animal, Environmental Other (comments)     Pollens, trees, dust causes chronic rhinitis and congestion   ??? Sulfa (Sulfonamide Antibiotics) Unknown (comments)   ??? Tolmetin Other (comments)     Family History   Problem Relation Age of Onset   ??? Cancer     ??? Hypertension  Social  History   Smoking Status   ??? Never Smoker   Smokeless Tobacco   ??? Never Used     History   Alcohol Use   ??? 0.5 oz/week   ??? 1 Glasses of wine per week        REVIEW OF SYSTEMS  Constitutional: Fever: No  Skin: Rash: No  HEENT: Hearing difficulty: No  Eyes: Blurred vision: No  Cardiovascular: Chest pain: No  Respiratory: Shortness of breath: No  Gastrointestinal: Nausea/vomiting: No  Musculoskeletal: Back pain: No  Neurological: Weakness: No  Psychological: Memory loss: No  Comments/additional findings:      PHYSICAL EXAM  Visit Vitals   ??? BP 110/62   ??? Ht 5\' 7"  (1.702 m)   ??? Wt 155 lb (70.3 kg)   ??? BMI 24.28 kg/m2     GENERAL APPEARANCE:  82 y.o. Caucasian  male alert, cooperative, no distress, appears stated age not obese;  ABDOMEN:  flat soft, non-tender. Bowel sounds normal. No masses,  no organomegaly  BACK:  symmetric, no curvature. ROM normal. No CVA tenderness.  GENITOURINARY:    Penis:  normal, noncircumcised  Scrotum:  normal  Testes:  normal, no masses, size 20cc  Epididymides:  normal  Anus/Perineum:  within normal limits  Sphincter tone:  within normal limits  Rectum:  negative without mass, lesions or tenderness    Prostate:  30 gm benign prostate wo nodules or induration  Seminal vesicles:  nonpalpable  LYMPHATIC:  Cervical, supraclavicular, and axillary nodes normal.      GU DATA/REVIEW OF LABS AND IMAGING    AUA Assessment Score:  AUA Score: 9;    AUA Bother Rating: Pleased    AUA Symptom Score 02/25/2016   Over the past month how often have you had the sensation that your bladder  was not completely empty after you finished urinating? 1   Over the past month, how often have had to urinate again less than 2 hours after you last finished urinating? 1   Over the past month, how often have you found you stopped and started again several times when you urinated? 2   Over the past month, how often have you found it difficult to postpone urination? 2   Over the past month, how often have you had a weak urinary stream? 2   Over the past month, how often have you had to push or strain to begin urinating? 0   Over the past month, how many times did you most typically get up to urinate from the time you went to bed at night until the time you got up in the morning? 1   AUA Score 9   If you were to spend the rest of your life with your urinary condition the way it is now, how would you feel about that? Pleased       Results for orders placed or performed in visit on 02/02/16   PSA TOTAL+% FREE   Result Value Ref Range    Prostate Specific Ag 3.0 0.0 - 4.0 ng/mL    PSA, Free 1.00 N/A ng/mL    PSA, % Free 33.3 %     Results for orders placed or performed in visit on 02/25/16   AMB POC URINALYSIS DIP STICK AUTO W/ MICRO (MICRO RESULTS)   Result Value Ref Range    Color (UA POC) Yellow     Clarity (UA POC) Clear     Glucose (UA POC) Negative Negative  Bilirubin (UA POC) 1+ Negative    Ketones (UA POC) Negative Negative    Specific gravity (UA POC) 1.020 1.001 - 1.035    Blood (UA POC) Negative Negative    pH (UA POC) 5.5 4.6 - 8.0    Protein (UA POC) Trace Negative    Urobilinogen (UA POC) 0.2 mg/dL 0.2 - 1    Nitrites (UA POC) Negative Negative    Leukocyte esterase (UA POC) Negative Negative    Epithelial cells (UA POC)      WBCs (UA POC) 0      RBCs (UA POC) 0      Bacteria (UA POC) None Negative    Crystals (UA POC)  Negative    Other (UA POC)            Copy to [name], MD and other appropriate care team members eFaxed today.     Jose Heys, MD  02/25/2016

## 2016-04-03 NOTE — Progress Notes (Deleted)
ED Provider Notes  Date of Service: 04/03/16 1028  Garrison, Reuben A, PA   Emergency Medicine   Cosigned by: Smith, Joshua R, MD at 04/03/16 1301      Hide copied text  Hover for attribution information  ??  [facility] EMERGENCY DEPARTMENT     Time of Arrival:  04/03/2016 10:20 AM   .  ??      Chief Complaint   Patient presents with   ??? HEMATURIA   ??  HPI  -----------------------------------------HISTORY OF PRESENT ILLNESS----------------------------------------  04/03/2016, 10:44 AM              Jose W Scott Jr. is a 83 y.o. male with hx of gout, hypercholesteremia, and HTN presenting to the ED for hematuria onset 4 days ago. Reports Jose Stafford was recently prescribed Flomax by Dr. Bowers (PCP), however Jose Stafford stopped taking it 4 days ago when Jose Stafford noted the hematuria. Complains of decreased urination since Jose Stafford stopped taking the Flomax, adding when Jose Stafford is able to urinate Jose Stafford noted an increased amount of blood. Additionally complains of suprapubic pain. Notes at initial onset of symptoms Jose Stafford felt a burning sensation in his penis, that has since resolved. Has hx of kidney stones but current sxs do not feel similar. Denies fevers, chills, congestion,cough, SOB, CP, N/V/D, falls, dysuria, rash, or HA.  ??  Review of Systems:  Constitutional: Negative for fever and chills.   HENT: Negative for congestion.    Eyes: Negative for pain.   Respiratory: Negative for cough and shortness of breath.    Cardiovascular: Negative for chest pain.   Gastrointestinal: Positive for abdominal pain. Negative for nausea, vomiting and diarrhea.   Genitourinary: Positive for hematuria. Negative for dysuria.   Musculoskeletal: Negative for falls.   Skin: Negative for rash.   Neurological: Negative for headaches.   ??  ??       Past Medical History:   Diagnosis Date   ??? BPH (benign prostatic hyperplasia) ??   ??? Gout ??   ??? Hypercholesteremia ??   ??? Hypertension ??   ??  No past surgical history on file.  No family history on file.  Social History   ??         Social History   ??? Marital status: Married   ?? ?? Spouse name: N/A   ??? Number of children: N/A   ??? Years of education: N/A   ??      Occupational History   ??? Not on file.   ??        Social History Main Topics   ??? Smoking status: Former Smoker   ?? ?? Quit date: 04/28/2000   ??? Smokeless tobacco: Not on file   ??? Alcohol use 1.5 oz/week   ?? ?? 1 Cans of beer, 1 Shots of liquor, 1 Glasses of wine per week   ??? Drug use: Unknown   ??? Sexual activity: Not on file   ??  Other Topics Concern   ??? Not on file   ??      Social History Narrative   ??? No narrative on file   ??  No outpatient prescriptions have been marked as taking for the 04/03/16 encounter (Hospital Encounter).   ??  No Known Allergies  ??  Vital Signs:  Patient Vitals for the past 72 hrs:  ?? Temp Heart Rate Resp BP BP Mean SpO2   04/03/16 1022 98.1 ??F (36.7 ??C) 111 17 166/94 118 MM HG 95 %   ??  ??    Physical Exam  ----------------------------------------------------PHYSICAL EXAM----------------------------------------------------  ??  CONSTITUTIONAL: Well developed, well nourished. Awake & alert. No acute respiratory distress, non-toxic in appearance, not diaphoretic.   ??  HEAD: Atraumatic. Normocephalic.  ??  ENT: Mucous membranes are moist and intact. Extra-ocular muscles are intact. Posterior pharynx is clear, no exudates.  ??  NECK: Normal ROM. Neck is supple without meningismus.  ??  CARDIOVASCULAR: Regular rate and rhythm, no murmurs, rubs, or gallops. Peripheral pulses are 2+ and equal.  ??  PULMONARY/CHEST: Clear to auscultation bilaterally. No wheezing, rales or rhonchi. Patient is speaking in full sentences without accessory muscle usage.  ??  ABDOMINAL: Soft and non-distended. There is suprapubic tenderness. No rebound, guarding, or rigidity.  ??  BACK: Non-tender. No CVA tenderness.  ??  MUSCULOSKELETAL: Full range of motion in all extremities. No peripheral edema or muscular tenderness.  ??  SKIN: Skin is warm and dry. No diaphoresis, rashes, or lesions.  ??   NEUROLOGICAL: Alert, awake, and appropriately oriented. Non-focal.  ??  -----------------------------------------------------DIAGNOSTICS------------------------------------------------------  ??  LABORATORY:         Results for orders placed or performed during the hospital encounter of 04/03/16   BASIC METABOLIC PANEL   Result Value Ref Range   ?? Potassium 4.7 3.5 - 5.5 mmol/L   ?? SODIUM 146 (H) 133 - 145 mmol/L   ?? CHLORIDE 106 98 - 110 mmol/L   ?? Glucose 160 (H) 70 - 99 mg/dL   ?? CALCIUM 10.0 8.4 - 10.4 mg/dL   ?? BUN 31 (H) 6 - 22 mg/dL   ?? CREATININE 1.1 0.8 - 1.6 mg/dL   ?? CO2 25 20 - 32 mmol/L   ?? eGFR African American >60.0 >60.0   ?? eGFR Non African American >60.0 >60.0   ?? ANION GAP 14.8 mmol/L   CBC WITH DIFFERENTIAL   Result Value Ref Range   ?? WBC x 10*3 8.7 4.0 - 11.0 K/uL   ?? RBC x 10^6 3.98 3.80 - 5.80 M/uL   ?? HGB 10.8 (L) 12.6 - 17.1 g/dL   ?? HCT 33.5 (L) 37.8 - 52.2 %   ?? MCV 84 80 - 95 fL   ?? MCH 27 26 - 34 pg   ?? MCHC 32 32 - 36 g/dL   ?? RDW 14.3 10.0 - 16.0 %   ?? Platelet 357 140 - 440 K/uL   ?? MPV 9.9 6.0 - 10.8 fL   ?? Segmented Neutrophils 80 (H) 40 - 75 %   ?? Lymphocytes 12 (L) 27 - 45 %   ?? Monocytes 7 3 - 9 %   ?? Eosinophil 2 0 - 6 %   ?? Basophils 0 0 - 2 %   ?? Absolute Neutrophils 6.9 1.8 - 7.7 K/uL   ?? Absolute Lymphocytes 1.0 1.0 - 4.8 K/uL   ?? Absolute Monocyte Count 0.6 0.1 - 0.9 K/uL   ?? Absolute Eosinophil 0.1 0.0 - 0.5 K/uL   ?? Absolute Basophil Count 0.0 0.0 - 0.2 K/uL   Urinalysis with Microscopic   Result Value Ref Range   ?? Urine pH 5.0 5.0 - 8.0 pH   ?? Urine Protein Screen 30* (A) Negative, Trace mg/dL   ?? Urine Glucose Negative Negative mg/dL   ?? Urine Ketones Negative Negative mg/dL   ?? Urine Occult Blood Moderate (A) Negative   ?? Urine Specific Gravity 1.016 1.005 - 1.030   ?? Urine Nitrite Negative Negative   ?? Urine Leukocyte Esterase Negative Negative   ??   Urine Bilirubin Negative Negative   ?? Urine Urobilinogen <2.0 <2.0 mg/dL   ?? Urine RBC 20-50 (A) Negative, 0-2 /hpf    ?? Urine WBC 0-2 0 - 2 /hpf   ??  ??  RADIOLOGY:   No orders to display   Interpreted by radiologist.  ??  ED Objective Order Summary:       Orders Placed This Encounter   Procedures   ??? BASIC METABOLIC PANEL   ??? CBC WITH DIFFERENTIAL   ??? Urinalysis with Microscopic   ??? CBC WITH DIFFERENTIAL   ??  ??  -----------------------------ED COURSE -AND- MEDICAL DECISION MAKING----------------------------  ??  Documentation Review:              I have examined the available past medical records from previous ED visits, hospital admissions, or clinic visits.  The information obtained has contributed to PMSHx and the context for this visit.                The nursing notes for this patient's current visit have been reviewed, and any pertinent information has been incorporated into this note, particularly the vital signs, PMSFSHx, and initial presenting complaint.                ??  Medications   cephALEXin (KEFLEX) capsule 500 mg (500 mg Oral Given 04/03/16 1216)   ??  ??  ??  Provider's Notes:   DIFFERENTIAL DIAGNOSIS INCLUDES: urinary retention, BPH, UTI, prostatitis, urethral stricture, prostate lesion, hematuria with clots, outlet obstruction, renal insufficiency or failure.  ??  12:18 PM Jose Stafford was found to be in urinary retention with >999mL on bladder scan, after foley placed Jose Stafford feels sig better. Abdomen soft non-tender on reassessment. No pain. Instructed to restart Flomax, will send home with foley in place and leg bag, start on abx and refer to urology for definitive treatment. Urinary retention/hematuria/foley d/c instructions, f/u and RTED precautions reviewed, Jose Stafford is agreeable and all questions are answered.   ??  I have discussed the results and have answered all questions Jose Stafford during his ED visit. If, at any time, Jose Stafford feels worse or is not improving, I have urged him to return to the ED immediately, and Jose Stafford agrees with the plan to be discharged. I have discussed the need for  follow up with his PCP/referral physician and Jose Stafford has agreed to follow up as instructed in his discharge instructions.     ??  FINAL DIAGNOSIS:  (R33.8) Acute urinary retention  ??  (R31.0) Gross hematuria  ??  -------------------------------------------------------DISPOSITION-----------------------------------------------------  ??  Destination: Home  ??  Discharge Rx:        New Prescriptions   ?? CEPHALEXIN (KEFLEX) 500 MG PO CAPS    Take 1 Cap by Mouth Every 12 hours for 7 days.   ??  ??  Scribe Note:  By signing my name below, I, SARAH I BAYSAL, attest that this documentation has been prepared under the direction and in the presence of Reuben Garrison, PA-C.  SARAH I BAYSAL, ED scribe  04/03/2016, 10:29 AM  ??  I, Reuben Garrison, PA-C, personally performed the services described in this documentation. All medical record entries made by the scribe were at my direction and in my presence. I have reviewed the chart and discharge instructions (if applicable) and agree that the record reflects my personal performance and is accurate and complete.  Reuben Garrison, PA-C  04/03/2016, 12:17 PM  ??            ED on 04/03/2016    ??       Revision History    ??       Detailed Report    ??   Electronic Signature Info     Author Note Status Last Update User   Garrison, Reuben A, PA Signed Garrison, Reuben A, PA   Last Update Date/Time: 04/03/2016 12:20 PM

## 2016-04-03 NOTE — Progress Notes (Signed)
A user error has taken place: encounter opened in error, closed for administrative reasons.

## 2016-04-17 ENCOUNTER — Inpatient Hospital Stay
Admit: 2016-04-17 | Discharge: 2016-04-17 | Disposition: A | Discharge status: Home / Self Care | Payer: MEDICARE | Attending: Internal Medicine

## 2016-04-17 DIAGNOSIS — N3001 Acute cystitis with hematuria: Secondary | ICD-10-CM

## 2016-04-17 LAB — URINALYSIS W/ RFLX MICROSCOPIC
Bilirubin: NEGATIVE
Glucose: NEGATIVE mg/dL
Ketone: NEGATIVE mg/dL
Nitrites: NEGATIVE
Specific gravity: 1.016 (ref 1.005–1.030)
Urobilinogen: 0.2 EU/dL (ref 0.2–1.0)
pH (UA): 5.5 (ref 5.0–8.0)

## 2016-04-17 LAB — URINE MICROSCOPIC ONLY
Epithelial cells: NEGATIVE /lpf (ref 0–5)
RBC: 4 /hpf (ref 0–5)

## 2016-04-17 MED ORDER — CEPHALEXIN 500 MG CAP
500 mg | ORAL_CAPSULE | Freq: Two times a day (BID) | ORAL | 0 refills | Status: AC
Start: 2016-04-17 — End: 2016-04-24

## 2016-04-17 MED ORDER — CEPHALEXIN 500 MG CAP
500 mg | ORAL_CAPSULE | Freq: Two times a day (BID) | ORAL | 0 refills | Status: DC
Start: 2016-04-17 — End: 2016-04-17

## 2016-04-17 NOTE — Progress Notes (Signed)
Pending sensitivity. Jose ShaveElizabeth M Sya Stafford, [place]

## 2016-04-17 NOTE — ED Provider Notes (Signed)
EMERGENCY DEPARTMENT HISTORY AND PHYSICAL EXAM    Date: 04/17/2016  Patient Name: Jose Stafford    History of Presenting Illness     Chief Complaint   Patient presents with   ??? Urinary Pain         History Provided By: Patient    Chief Complaint: dysuria  Duration: 6 Weeks  Timing:  recurrent  Quality: Burning  Severity: Mild  Modifying Factors: none  Associated Symptoms: frequency    Additional History (Context):   10:50 AM   Jose Stafford is a 83 y.o. male with PMHX of UTI (6 weeks ago) who presents to the emergency department C/O recurrent burning dysuria onset 3 days ago. Associated sxs include increased frequency. Pt was seen at [facility] for his previous UTI  About 6 weeks ago and was prescribed a drug BID for 10 days, and states that sxs are consistent with UTI. Pt is seen by [name], MD, Urologist for BPH and urinary urgency. Pt states that he gets HA when he takes Sulfa. Pt states that he has "rampant" FHx of prostate CA. Pt denies fever, testicular pain, rash or swelling in the genitals, abdominal pain, and any other sxs or complaints.     PCP: [name], MD    Current Outpatient Prescriptions   Medication Sig Dispense Refill   ??? ferrous sulfate ER (IRON) 160 mg (50 mg iron) TbER tablet Take 1 Tab by mouth daily.     ??? cephALEXin (KEFLEX) 500 mg capsule Take 1 Cap by mouth two (2) times a day for 7 days. 14 Cap 0   ??? cyanocobalamin (VITAMIN B-12) 2,500 mcg sublingual tablet Take 2,500 mcg by mouth daily.     ??? simvastatin (ZOCOR) 20 mg tablet   1   ??? sertraline (ZOLOFT) 50 mg tablet   3   ??? OMEPRAZOLE (PRILOSEC PO) Take  by mouth.     ??? acetaminophen (TYLENOL) 325 mg tablet Take  by mouth every four (4) hours as needed.     ??? montelukast (SINGULAIR) 10 mg tablet Take 10 mg by mouth daily.       ??? Cetirizine (ZYRTEC) 10 mg Cap Take  by mouth.       ??? aspirin delayed-release 81 mg tablet Take  by mouth daily.       ??? GUAIFENESIN (MUCINEX PO) Take  by mouth.           Past History      Past Medical History:  Past Medical History:   Diagnosis Date   ??? Anemia    ??? Atrial fibrillation (HCC)    ??? Benign localized hyperplasia of prostate with urinary obstruction and other lower urinary tract symptoms (LUTS)(600.21)    ??? BPH without obstruction/lower urinary tract symptoms    ??? Elevated PSA    ??? Family hx of prostate cancer    ??? Incomplete bladder emptying    ??? PVC (premature ventricular contraction)    ??? Sleep apnea    ??? Urgency of urination    ??? Urgency of urination        Past Surgical History:  Past Surgical History:   Procedure Laterality Date   ??? CARDIAC SURG PROCEDURE UNLIST     ??? HX ORTHOPAEDIC     ??? HX OTHER SURGICAL      subdural hematoma   ??? HX SHOULDER ARTHROSCOPY      bilateral        Family History:  Family History  Problem Relation Age of Onset   ??? Cancer Other    ??? Hypertension Other        Social History:  Social History   Substance Use Topics   ??? Smoking status: Never Smoker   ??? Smokeless tobacco: Never Used   ??? Alcohol use 0.5 oz/week     1 Glasses of wine per week      Comment: rare       Allergies:  Allergies   Allergen Reactions   ??? Milk Contact Dermatitis   ??? Other Plant, Animal, Environmental Other (comments)     Pollens, trees, dust causes chronic rhinitis and congestion   ??? Sulfa (Sulfonamide Antibiotics) Unknown (comments)   ??? Tolmetin Other (comments)         Review of Systems   Review of Systems   Constitutional: Negative for fever.   Gastrointestinal: Negative for abdominal pain.   Genitourinary: Positive for dysuria and frequency. Negative for penile pain, penile swelling, scrotal swelling and testicular pain.   All other systems reviewed and are negative.      Physical Exam     Vitals:    04/17/16 1019 04/17/16 1134   BP: 105/46 103/48   Pulse: 73 70   Resp: 16 16   Temp: 98.1 ??F (36.7 ??C)    SpO2: 98% 99%   Weight: 65.8 kg (145 lb)    Height: 5\' 7"  (1.702 m)      Physical Exam   Nursing note and vitals reviewed.  Vital signs and nursing notes reviewed.     CONSTITUTIONAL: Alert. Well-appearing; well-nourished; elderly male, in no apparent distress.      CV: Normal S1, S2; no murmurs, rubs, or gallops.   RESPIRATORY: Normal chest excursion with respiration; breath sounds clear and equal bilaterally; no wheezes, rhonchi, or rales.  GI: Normal bowel sounds; non-distended; non-tender; no guarding or rigidity; no palpable organomegaly. No CVA tenderness.   GU:  Normal appearing uncircumcised penis without rash or discharge; foreskin easily retracted; no scrotal rash or testicular tenderness or mass.  SKIN: Normal for age and race; warm; dry; good turgor; no apparent lesions or exudate.  NEURO: A & O x3.   PSYCH:  Mood and affect appropriate.         Diagnostic Study Results     Labs -     Recent Results (from the past 12 hour(s))   URINALYSIS W/ RFLX MICROSCOPIC    Collection Time: 04/17/16 10:22 AM   Result Value Ref Range    Color YELLOW      Appearance TURBID      Specific gravity 1.016 1.005 - 1.030      pH (UA) 5.5 5.0 - 8.0      Protein TRACE (A) NEG mg/dL    Glucose NEGATIVE  NEG mg/dL    Ketone NEGATIVE  NEG mg/dL    Bilirubin NEGATIVE  NEG      Blood MODERATE (A) NEG      Urobilinogen 0.2 0.2 - 1.0 EU/dL    Nitrites NEGATIVE  NEG      Leukocyte Esterase LARGE (A) NEG     URINE MICROSCOPIC ONLY    Collection Time: 04/17/16 10:22 AM   Result Value Ref Range    WBC TOO NUMEROUS TO COUNT 0 - 5 /hpf    RBC 4 to 10 0 - 5 /hpf    Epithelial cells NEGATIVE  0 - 5 /lpf    Bacteria 2+ (A) NEG /hpf  Radiologic Studies -   No orders to display     CT Results  (Last 48 hours)    None        CXR Results  (Last 48 hours)    None          Medications given in the ED-  Medications - No data to display      Medical Decision Making   I am the first provider for this patient.    I reviewed the vital signs, available nursing notes, past medical history, past surgical history, family history and social history.    Vital Signs-Reviewed the patient's vital signs.     Pulse Oximetry Analysis - 98% on RA     Records Reviewed: Nursing Notes and Old Medical Records   Stable, normal  PSA. Urology note states no risk of Prostate CA, although he has a FHx of this.    Provider Notes (Medical Decision Making):     Procedures:  Procedures    ED Course:   10:50 AM Initial assessment performed. The patients presenting problems have been discussed, and they are in agreement with the care plan formulated and outlined with them.  I have encouraged them to ask questions as they arise throughout their visit.    Diagnosis and Disposition       DISCHARGE NOTE:  11:24 AM  Jake Shark [place]'s  results have been reviewed with him.  He has been counseled regarding his diagnosis, treatment, and plan.  He verbally conveys understanding and agreement of the signs, symptoms, diagnosis, treatment and prognosis and additionally agrees to follow up as discussed.  He also agrees with the care-plan and conveys that all of his questions have been answered.  I have also provided discharge instructions for him that include: educational information regarding their diagnosis and treatment, and list of reasons why they would want to return to the ED prior to their follow-up appointment, should his condition change. He has been provided with education for proper emergency department utilization.     CLINICAL IMPRESSION:    1. Acute cystitis with hematuria        PLAN:  1. D/C Home  2.   Discharge Medication List as of 04/17/2016 11:35 AM        3.   Follow-up Information     Follow up With Details Comments Contact Info    [name], MD Schedule an appointment as soon as possible for a visit in 2 days For follow up with urology [address]  [place] [address]  [facility] [place] [address]  [phone]      [facility] EMERGENCY DEPT Go to As needed, If symptoms worsen [address]  [place] [address]  [phone]        _______________________________    Attestations:   This note is prepared by [employer], acting as Neurosurgeon for [employer], PA-C.    Zebedee Iba, PA-C:  The scribe's documentation has been prepared under my direction and personally reviewed by me in its entirety.  I confirm that the note above accurately reflects all work, treatment, procedures, and medical decision making performed by me.  _______________________________

## 2016-04-17 NOTE — ED Triage Notes (Signed)
Pt states he is having uti sx. Treated for same about 4-6 weeks ago, pt c/o burning with urination and increase frequency

## 2016-04-17 NOTE — ED Notes (Signed)
I have reviewed discharge instructions with the patient.  The patient verbalized understanding.

## 2016-04-18 NOTE — Progress Notes (Signed)
Pt aware of appt.  [id number]

## 2016-04-18 NOTE — Progress Notes (Addendum)
BPH w LUTS/Hx of Elevated PSA/Family Hx of PCA death   May 21, 2007 psa 1.9; 081009 psa 2.3; 020810 psa 1.6; 080910 psa 2.2; 030111 psa 3.1; 161096 psa 2.9; 045409 psa 2.23; 811914 psa 2.5; 032513 psa 2.2; 782956 psa 2.2;  213086 psa 2.4; 100714 psa 5.09,%free psa 19%; 102814 psa 2.3,%free psa 33.5%; 578469 psa 2.78 %free psa 32%; 071715 psa 4.44; 093015 psa 5.81 %free psa 24%; 052416 psa 2.91 %free psa 32%; 629528 psa 2.4 %free psa 30%; 110117 psa 3.0, %free psa 33%;        10/04/2009 chart entry - pt has fu appt with me in September having been referred by Dr Burnadette Peter per his retirement; 83 yo with normal psa but abnormal psa velocity; has bph;   11/19/2009 office encounter - Preexam psa pending, pt to call if hasn't heard psa report by two weeks; exam shows 30 gm prostate, l>r wo nodules; reassured pt no sign of cancer; copy of psa to pcp planned;   Psa 2.9, reassuring letter to patient, copy of report to Dr Donette Larry   05/20/2010 medical assistant encounter - psa draw 2.23;  appt near future  05/27/2010 office encounter  - elev psa surveillance in pt with family hx of sig pca; psa range is 1.9 - 3.1, present 2.23; pt anxious about his psa and family hx wants six month checks; urine is micro benign; exam shows 30 gm prostate r>l wo nodules or induration; reassured pt no sign of prostate cancer but at risk due to age and family hx; reassured him pca death unlikely for him;   12/28/2010 medical assistant encounter - psa draw, reassuring stable value of 2.5; appt near future  12/06/2010 office encounter  - elevated psa hx in pt with strong fam hx of pca - brothers and his son and 3 nephews all with pca; reviewed his psa hx which is reassuring with stable value of 2.5; pt wo sxs; feels great - even though 48 he wishes active surveillance; exam shows 30 gm prostate r>l wo nodules or induration; will continue six month surveillance  06/06/2011 medical assistant encounter - psa 2.2, cw, let pt know his psa  improved normal at 2.2; arrange appt with me in six months for " ua surveillance bph luts fam hx pca " - i recommend no psa testing; if he insists on psa testing, then get psa level two week prior;   12/08/2011 office encounter  - extensive fam hx of pca; here for fu exam, psa's have been normal recently so pt deferred psa testing; exam shows 30 gm prostate R>L  wo nodules; continue six months surveillance  psa was drawn for some reason, psa 2.2; no more psa's, letter to patient  06/18/2012 office encounter  - pt insists on continued psa checks in addition to prostate exam due to strong family hx of pca death; exam shows 30 gm prostate l>r wo nodules or induration; psa currently is 2.4, reviewed psa hx with im, recheck in six months;   12/25/2012 office encounter  - pt concerned about psa jump from 2.4 to 5; has sig fam hx pca; appetite good, weight stable, no unusual bone or joint pains; 30 gm prostate l>r wo nodules or induration; will recheck f/t psa in thee months, as long as stable, then fu exam in six months with f/t psa  01/08/2013 medical assistant encounter - pt not willing to wait three months for recheck psa, repeat psa 2.3 with reassuring %free psa 33.5%  01/14/2013 telephone encounter -  debbie, call pt, im sure he already knows value but his repeat psa 2.3 and %free psa is normal 33.5%, i recommend no further psa testing but pt insists on psa testing; cancel planned appt in January for psa testing; ask him when he wants his next psa done? Likely arrange nurse appt mid April for f/t psa and exam with me late April for " pvr/ua surveillance fam hx pca bph luts ibe (confirm f/t psa prior) "   06/12/2013 medical assistant encounter - psa normal 2.78 %free psa 32%; pt has an invalid appt for april 8th, this was missed on checkout april 1st and not rescheduled to a valid date and appt time; [name], please make sure this gets changed on Monday apirl 6th so pt doesn't come to office expecting to be seen on Wednesday   07/09/2013 office encounter  - current psa 2.78, pt with fam hx of pca in two brothers, one son with pca, one died of pca; pt has high anxiety about pca; reviewed his psa hx in detail with him, asided from transient spike last october, psa have been very reassuringly normal and with reassuring %free psa; 3xam today shows 35 gm benign prostate wo nodules or induration; pt wants continued six month surveillance with f/t psa and exam;   09/27/2013 medical assistant encounter - psa increased to 4.44, spiked to 5.09 last fall so no particular worry here, pvr 80 cc, ua benign;    09/30/2013 telephone encounter - debbie, not sure why he came in for psa at this time - ask him reason when you call him; yes, his psa did bump up a bit to 4.44 but that is not concerning, had spike last fall to 5.09; he has appts for f/t psa in late september and fu exam mid october; also let him know his urine ok and pvr ok;   12/23/2013 office encounter  - psa has increased to 5.81 with favorable %free psa 24% - have finally reached his level of intolerance of my recommended ww/active surveillance; pt has fam hx of pca death and is very apprehensive about this rise in psa; has seen [facility] and [facility] in past; discussed options - he understands risks of nbxp including death from sepsis; he would like to see oncologist; will arrange for him to see Dr Given; offered him 12 core biopsy today; discussed targeted biopsy but best to go with 12 core first; happy to see him in fu and do biopsy pending his discussion with Dr Given; pt understands reluctance to proceed with radiation therapy at age 82 with its risks and complications; certainly would not entertain surgery; will see what Dr Given has to say.  Exam today shows 35 gm benign prostate wo nodules or induration, left lobe maybe slightly larger than right   [id number] [facility] ONC CONSULT GIVEN notes IMPRESSION: 1. Variable PSA, highest of  5.81ng/ml in 11/2013 and most recently 3.3ng/ml in 12/2013. Has been on active surveillance in the past with Dr. Nicanor Alcon but requested consultation with oncology. I advised pt at his age that he should not get too excited about his PSA, in fact the guidelines would not advise further screening. 2. BPH with mild LUTS, specifically weak FOS, followed by Dr. Nicanor Alcon PLAN: 1. FU with Dr. Nicanor Alcon q 6 months as planned   08/05/2014 office encounter  - Preexam F/T PSA pending, pt to call if hasn't heard F/T PSA report by two weeks; sig fam hx of pca; exam shows 40 gm benign prostate wo  nodules or induration; will make fu plans pending current psa results  psa 2.91, %free psa 32%; letter to patient and results to pcp, gave pt choice of fu six months or a year.   01/27/2015 office encounter  - hx of elev psa, fam hx pca, pt has high anxiety, current psa favorable 2.69 and %free psa favorable 31%; urine benign; exam shows 35 gm prostate L>R wo nodules or induration; pt wants six month surveillance   07/30/2015 office encounter  - hx of elev psa surveillance and fam hx of pca; pt has high anxiety regarding pca and insists on surveillance; , current psa 2.4 %free psa 30%; urine benign, exam shows 30 gm benign prostate l>r wo nodules or induration; pt wants six month surveillance  02/25/2016 office encounter  - continues with personal requested six month surveillance with exam and psa due to fam hx pca death; current psa stable 3.0 with reassuring %free psa 33%; denies hematuria or dysuria; ua dip and micro negative; exam shows 30 gm benign prostate wo nodules or induration; reassured pt no sign of pca risk other than his fam hx;   [id number] [facility] ED notes Alexandre Faries is a 83 y.o. male with PMHX of UTI (6 weeks ago) who presents to the emergency department C/O recurrent burning dysuria onset 3 days ago. Associated sxs include increased frequency. Pt was seen at [facility] for his previous UTI  About 6 weeks ago and was  prescribed a drug BID for 10 days, and states that sxs are consistent with UTI. Pt is seen by [name], MD, Urologist for BPH and urinary urgency. Pt states that he gets HA when he takes Sulfa. Pt states that he has "rampant" FHx of prostate CA. Pt denies fever, testicular pain, rash or swelling in the genitals, abdominal pain, and any other sxs or complaints. sx uti, ua shows pyuria, culture set up Rx keflex  04/18/2016 telephone encounter - lynn/cs - work together on this - call pt to let him know has appt this thursday at 0900 - should have culture back by then, pt on keflex, reassure him that Dr Nicanor Alcon aware of his ER visit, please make sure the urine culture from [id number] is followed up on to make sure pt is on correct abx  Thanks/[id number]      IBE /Weak Stream  POST VOID RESIDUAL  10/22/2007  04/21/2008  10/20/2008    PVR  72ml  58cc  310   11/19/2009 office encounter - auass 4, pvr 45 cc, pt wo bother   06/18/2012 office encounter  - auass 9, stream a little slower; pvr 163 cc (initially 269, had pt double void and pvr 163 cc) will recheck pvr in a month;   12/25/2012 office encounter  - auass 13, not bothered, pvr 184 cc;   07/09/2013 office encounter  - auass 12, pvr 98 cc, defers meds  09/27/2013 medical assistant encounter - pvr 80 cc  12/23/2013 office encounter  - auass 18; pvr 32 cc, some bother  02/25/2016 office encounter  - auass 9, no bother, no meds;       UTI  [id number] [facility] ED notes Andros Channing is a 83 y.o. male with PMHX of UTI (6 weeks ago) who presents to the emergency department C/O recurrent burning dysuria onset 3 days ago. Associated sxs include increased frequency. Pt was seen at [facility] for his previous UTI  About 6 weeks ago and was prescribed a drug BID for 10 days, and  states that sxs are consistent with UTI. Pt is seen by [name], MD, Urologist for BPH and urinary urgency. Pt states that he gets HA when he takes Sulfa. Pt states that he  has "rampant" FHx of prostate CA. Pt denies fever, testicular pain, rash or swelling in the genitals, abdominal pain, and any other sxs or complaints. sx uti, ua shows pyuria, culture set up Rx keflex  CULTURE SHOWS E COLI pans sensitive including the keflex he is taking  04/18/2016 telephone encounter - lynn/cs - work together on this - call pt to let him know has appt this thursday at 0900 - should have culture back by then, pt on keflex, reassure him that Dr Nicanor Alcon aware of his ER visit, please make sure the urine culture from [id number] is followed up on to make sure pt is on correct abx  Thanks/[id number]      Family Hx of Prostate Cancer   11/19/2009 office encounter - reports 3 brothers with pca  12/06/2010 office encounter  - now his 29 yo son has been diagnosed with pca;  12/25/2012 office encounter  -  Sig fam hx of pca;       BPH w LUTS   Early 2000's microwave treatment - successful   [id number] Burnadette Peter) notes This is a follow-up visit for [name] who has an established diagnosis of BPH with obstruction. There has been improvement of his symptoms. He reports nocturia one time a night. His current medications includes no medications. He has not experienced Problems when on these medications. Since his last visit he notes less frequency less nocturia less urgency   [id number] Burnadette Peter) notes There has been improvement of his symptoms. He reports nocturia is rare now. He is bothered by some daytime urgency, which has become more noticeable over the past 6 mos. His current medications includes no medications. Since his last visit he notes less frequency less nocturia But more daytime urgency; PLAN Sanctura trial   There has been further improvement of his symptoms. He reports nocturia is rare now, and not more than once nightly. He was bothered by some daytime urgency, but began Sanctura with good improvement.   [id number] Burnadette Peter) notes His current medications includes no BPH medications.  He continues on Reunion, with good results.   [id number] Burnadette Peter) notes There has been improvement of his symptoms. He reports nocturia one or two times a night. He is emptying well, with a PVR of 310 cc today. He is reasonably happy with his voiding despite this. His current medications includes Sanctura 60 mgm qHS. He has not experienced side effects on this medication.   [id number] [name]) notes irritable sxs controlled with sanctura, notes 40 gm benign prostate   10/04/2009 chart entry - chart reviewed, note pt has rx for sudafed and is on sanctura with 40 gm prostate - will discuss impact of this rx on bph sxs and increased pvr;   11/19/2009 office encounter - bph surveillance, reviewed risk of sudafed with bph - uses rarely; remains on sanctura xr on empty stomach, pleased with bladder control; discussed trial of cessation if he would like since doing; denies hematuria or dysuria; urine is dip and micro negative, exam shows 30 gm benign prostate l>r wo Nodules; ok to trial cesation of sanctura if he'd like   05/01/2010 chart entry ??? changed insurance needs alternative for sanctura since not on his insurance approved list, meds listed include oxybutynin, oxybutynin er 5, 10, 15, enablex, gelnique, vesicare; will go with  vesicare 10 mg #30 rfx11, [facility]  05/06/2010 chart entry ??? vesicare too expensive, pt wants generic, pt advised of short term memory loss associated with oxybutynin; Rx oxbutynin er 10 mg daily #30 [id number]  05/27/2010 office encounter  - pt opted to stay w vesicare and avoid side effects with the oxybutynin; his AUA symptom score (detailed in the documents flowsheet) is only 2 on vesicare; pt pleased; will titrate dose to every other and then every third; whatever minimum dosage is effective;   12/06/2010 office encounter  - weaned off vesicare, his AUA symptom score (detailed in the documents flowsheet) is 7; denies hematuria or dysuria; ua dip and micro negative;    12/08/2011 office encounter  - bph surveillance, no meds; pt satisfied with voiding; his AUA symptom score (detailed in the documents flowsheet) is 4, denies hematuria or dysuria; ua dip and micro negative; occl urgency  06/18/2012 office encounter  - not on any meds right now, stream a little slower; his AUA symptom score (detailed in the documents flowsheet) is 9; denies hematuria or dysuria; ua dip and micro negative; exam shows 30 gm benign prostate wo nodules or induration; will check pvr; pt defers any meds for now; if beomes bothered will call and we will trial flomax;   12/25/2012 office encounter  - bph surveillance, mild bother, pt defers meds in spite of stop and start nature to stream;   07/09/2013 office encounter  - bph surveillance, has enlarged prostate, tolerates his auass 12 ok; denies hematuria or dysuria;   09/27/2013 medical assistant encounter - psa increased to 4.44, spiked to 5.09 last fall so no particular worry here, pvr 80 cc, ua benign;    12/23/2013 office encounter  - bph /luts still some bother but not more so than his anxiety about pca and rising psa; his AUA symptom score (detailed in GU DATA/REVIEW OF LABS AND IMAGING below) is 18; \\  08/05/2014 office encounter  - bph surveillance, his AUA symptom score (detailed in GU DATA/REVIEW OF LABS AND IMAGING below) is 13, tolerates, denies gross hematuria or dysuria; ua dip and micro negative;  01/27/2015 office encounter  - denies gross hematuria or dysuria; ua dip and micro negative; his AUA symptom score (detailed in GU DATA/REVIEW OF LABS AND IMAGING below) is 18, offered alpha blocker pt declines  07/30/2015 office encounter  - bph surveillance, nlo meds and satisfied with voidning; his AUA symptom score (detailed in GU DATA/REVIEW OF LABS AND IMAGING below) is 11, denies hematuria or dysuria; ua dip and micro negative;  02/25/2016 office encounter  - bph surveillance, his AUA symptom score  (detailed in GU DATA/REVIEW OF LABS AND IMAGING below) is 9 denies hematuria or dysuria; urine shows clear;

## 2016-04-19 LAB — CULTURE, URINE: Culture result:: >100000 — AB

## 2016-04-21 ENCOUNTER — Ambulatory Visit: Admit: 2016-04-21 | Discharge: 2016-04-21 | Payer: MEDICARE | Attending: Urology | Primary: Family Medicine

## 2016-04-21 DIAGNOSIS — N390 Urinary tract infection, site not specified: Secondary | ICD-10-CM

## 2016-04-21 LAB — AMB POC URINALYSIS DIP STICK AUTO W/ MICRO (MICRO RESULTS)
Bilirubin (UA POC): NEGATIVE
Blood (UA POC): NEGATIVE
Glucose (UA POC): NEGATIVE
Ketones (UA POC): NEGATIVE
Nitrites (UA POC): NEGATIVE
Protein (UA POC): NEGATIVE
RBCs (UA POC): 0
Specific gravity (UA POC): 1.015 (ref 1.001–1.035)
Urobilinogen (UA POC): 0.2 (ref 0.2–1)
WBCs (UA POC): 0
pH (UA POC): 6 (ref 4.6–8.0)

## 2016-04-21 LAB — AMB POC PVR, MEAS,POST-VOID RES,US,NON-IMAGING: PVR: 0 cc

## 2016-04-21 MED ORDER — CIPROFLOXACIN 250 MG TAB
250 mg | ORAL_TABLET | Freq: Two times a day (BID) | ORAL | 0 refills | Status: DC
Start: 2016-04-21 — End: 2016-06-22

## 2016-04-21 NOTE — Progress Notes (Signed)
[name], MD  [facility] [place]  [address]  [place], [place] [address]  [phone]    Patient:  Jose Stafford  DOB:  06-18-1927  Date of Service:  04/21/2016    ASSESSMENT/PLAN    Encounter Diagnoses   Name Primary?   ??? Urinary tract infection without hematuria, site unspecified Yes   ??? Benign prostatic hyperplasia with lower urinary tract symptoms, symptom details unspecified    ??? Urgency of urination    ??? Incomplete bladder emptying        Assessment:  Recurrent uti - actually probable incomplete treatment of uti in early january; bph/luts stable, pvr ok  Plan:  Stop keflex, cipro 250 bid for 2 weeks, probiotic for two weeks; ua and culture in six weeks , come to office any time for sxs of uti      HISTORY OF PRESENT ILLNESS  He is currently 83 y.o. and his primary care provider is [name], MD.    Chief Complaint   Patient presents with   ??? (LUTS) Lower Urinary Tract Symptoms   ??? Benign Prostatic Hypertrophy   ??? UTI     follow-up       BPH w LUTS/Hx of Elevated PSA/Family Hx of PCA death   May 19, 2007 psa 1.9; 081009 psa 2.3; 020810 psa 1.6; 914782 psa 2.2; 030111 psa 3.1; 956213 psa 2.9; 086578 psa 2.23; 469629 psa 2.5; 528413 psa 2.2; 244010 psa 2.2;  272536 psa 2.4; 100714 psa 5.09,%free psa 19%; 102814 psa 2.3,%free psa 33.5%; 644034 psa 2.78 %free psa 32%; 071715 psa 4.44; 093015 psa 5.81 %free psa 24%; 052416 psa 2.91 %free psa 32%; 742595 psa 2.4 %free psa 30%; 110117 psa 3.0, %free psa 33%;        10/04/2009 chart entry - pt has fu appt with me in September having been referred by Dr Burnadette Peter per his retirement; 83 yo with normal psa but abnormal psa velocity; has bph;   11/19/2009 office encounter - Preexam psa pending, pt to call if hasn't heard psa report by two weeks; exam shows 30 gm prostate, l>r wo nodules; reassured pt no sign of cancer; copy of psa to pcp planned;   Psa 2.9, reassuring letter to patient, copy of report to Dr Donette Larry    05/20/2010 medical assistant encounter - psa draw 2.23;  appt near future  05/27/2010 office encounter  - elev psa surveillance in pt with family hx of sig pca; psa range is 1.9 - 3.1, present 2.23; pt anxious about his psa and family hx wants six month checks; urine is micro benign; exam shows 30 gm prostate r>l wo nodules or induration; reassured pt no sign of prostate cancer but at risk due to age and family hx; reassured him pca death unlikely for him;   December 26, 2010 medical assistant encounter - psa draw, reassuring stable value of 2.5; appt near future  12/06/2010 office encounter  - elevated psa hx in pt with strong fam hx of pca - brothers and his son and 3 nephews all with pca; reviewed his psa hx which is reassuring with stable value of 2.5; pt wo sxs; feels great - even though 7 he wishes active surveillance; exam shows 30 gm prostate r>l wo nodules or induration; will continue six month surveillance  06/06/2011 medical assistant encounter - psa 2.2, cw, let pt know his psa improved normal at 2.2; arrange appt with me in six months for " ua surveillance bph luts fam  hx pca " - i recommend no psa testing; if he insists on psa testing, then get psa level two week prior;   12/08/2011 office encounter  - extensive fam hx of pca; here for fu exam, psa's have been normal recently so pt deferred psa testing; exam shows 30 gm prostate R>L  wo nodules; continue six months surveillance  psa was drawn for some reason, psa 2.2; no more psa's, letter to patient  06/18/2012 office encounter  - pt insists on continued psa checks in addition to prostate exam due to strong family hx of pca death; exam shows 30 gm prostate l>r wo nodules or induration; psa currently is 2.4, reviewed psa hx with im, recheck in six months;   12/25/2012 office encounter  - pt concerned about psa jump from 2.4 to 5; has sig fam hx pca; appetite good, weight stable, no unusual bone or joint  pains; 30 gm prostate l>r wo nodules or induration; will recheck f/t psa in thee months, as long as stable, then fu exam in six months with f/t psa  01/08/2013 medical assistant encounter - pt not willing to wait three months for recheck psa, repeat psa 2.3 with reassuring %free psa 33.5%  01/14/2013 telephone encounter - debbie, call pt, im sure he already knows value but his repeat psa 2.3 and %free psa is normal 33.5%, i recommend no further psa testing but pt insists on psa testing; cancel planned appt in January for psa testing; ask him when he wants his next psa done? Likely arrange nurse appt mid April for f/t psa and exam with me late April for " pvr/ua surveillance fam hx pca bph luts ibe (confirm f/t psa prior) "   06/12/2013 medical assistant encounter - psa normal 2.78 %free psa 32%; pt has an invalid appt for april 8th, this was missed on checkout april 1st and not rescheduled to a valid date and appt time; [name], please make sure this gets changed on Monday apirl 6th so pt doesn't come to office expecting to be seen on Wednesday  07/09/2013 office encounter  - current psa 2.78, pt with fam hx of pca in two brothers, one son with pca, one died of pca; pt has high anxiety about pca; reviewed his psa hx in detail with him, asided from transient spike last october, psa have been very reassuringly normal and with reassuring %free psa; 3xam today shows 35 gm benign prostate wo nodules or induration; pt wants continued six month surveillance with f/t psa and exam;   09/27/2013 medical assistant encounter - psa increased to 4.44, spiked to 5.09 last fall so no particular worry here, pvr 80 cc, ua benign;    09/30/2013 telephone encounter - debbie, not sure why he came in for psa at this time - ask him reason when you call him; yes, his psa did bump up a bit to 4.44 but that is not concerning, had spike last fall to 5.09; he has appts for f/t psa in late september and fu exam mid october; also let  him know his urine ok and pvr ok;   12/23/2013 office encounter  - psa has increased to 5.81 with favorable %free psa 24% - have finally reached his level of intolerance of my recommended ww/active surveillance; pt has fam hx of pca death and is very apprehensive about this rise in psa; has seen [facility] and [facility] in past; discussed options - he understands risks of nbxp including death from sepsis; he would like to  see oncologist; will arrange for him to see Dr Given; offered him 12 core biopsy today; discussed targeted biopsy but best to go with 12 core first; happy to see him in fu and do biopsy pending his discussion with Dr Given; pt understands reluctance to proceed with radiation therapy at age 83 with its risks and complications; certainly would not entertain surgery; will see what Dr Given has to say.  Exam today shows 35 gm benign prostate wo nodules or induration, left lobe maybe slightly larger than right   [id number] GU ONC CONSULT GIVEN notes IMPRESSION: 1. Variable PSA, highest of 5.81ng/ml in 11/2013 and most recently 3.3ng/ml in 12/2013. Has been on active surveillance in the past with Dr. Nicanor AlconLasater but requested consultation with oncology. I advised pt at his age that he should not get too excited about his PSA, in fact the guidelines would not advise further screening. 2. BPH with mild LUTS, specifically weak FOS, followed by Dr. Nicanor AlconLasater PLAN: 1. FU with Dr. Nicanor AlconLasater q 6 months as planned   08/05/2014 office encounter  - Preexam F/T PSA pending, pt to call if hasn't heard F/T PSA report by two weeks; sig fam hx of pca; exam shows 40 gm benign prostate wo nodules or induration; will make fu plans pending current psa results  psa 2.91, %free psa 32%; letter to patient and results to pcp, gave pt choice of fu six months or a year.   01/27/2015 office encounter  - hx of elev psa, fam hx pca, pt has high anxiety, current psa favorable 2.69 and %free psa favorable 31%; urine  benign; exam shows 35 gm prostate L>R wo nodules or induration; pt wants six month surveillance   07/30/2015 office encounter  - hx of elev psa surveillance and fam hx of pca; pt has high anxiety regarding pca and insists on surveillance; , current psa 2.4 %free psa 30%; urine benign, exam shows 30 gm benign prostate l>r wo nodules or induration; pt wants six month surveillance  02/25/2016 office encounter  - continues with personal requested six month surveillance with exam and psa due to fam hx pca death; current psa stable 3.0 with reassuring %free psa 33%; denies hematuria or dysuria; ua dip and micro negative; exam shows 30 gm benign prostate wo nodules or induration; reassured pt no sign of pca risk other than his fam hx;   [id number] [facility] Jose BilletHarold Stafford is a 83 y.o. male with PMHX of UTI (6 weeks ago) who presents to the emergency department C/O recurrent burning dysuria onset 3 days ago. Associated sxs include increased frequency. Pt was seen at [facility] for his previous UTI  About 6 weeks ago and was prescribed a drug BID for 10 days, and states that sxs are consistent with UTI. Pt is seen by Harlin HeysJohn D Mileydi Milsap, MD, Urologist for BPH and urinary urgency. Pt states that he gets HA when he takes Sulfa. Pt states that he has "rampant" FHx of prostate CA. Pt denies fever, testicular pain, rash or swelling in the genitals, abdominal pain, and any other sxs or complaints. sx uti, ua shows pyuria, culture set up Rx keflex  04/18/2016 telephone encounter - lynn/cs - work together on this - call pt to let him know has appt this thursday at 0900 - should have culture back by then, pt on keflex, reassure him that Dr Nicanor AlconLasater aware of his ER visit, please make sure the urine culture from [id number] is followed up on to make sure pt  is on correct abx  Thanks/[id number]      IBE /Weak Stream  POST VOID RESIDUAL  10/22/2007  04/21/2008  10/20/2008    PVR  72ml  58cc  310    11/19/2009 office encounter - auass 4, pvr 45 cc, pt wo bother   06/18/2012 office encounter  - auass 9, stream a little slower; pvr 163 cc (initially 269, had pt double void and pvr 163 cc) will recheck pvr in a month;   12/25/2012 office encounter  - auass 13, not bothered, pvr 184 cc;   07/09/2013 office encounter  - auass 12, pvr 98 cc, defers meds  09/27/2013 medical assistant encounter - pvr 80 cc  12/23/2013 office encounter  - auass 18; pvr 32 cc, some bother  02/25/2016 office encounter  - auass 9, no bother, no meds;       UTI  [id number] [facility] ED notes Long Brimage is a 83 y.o. male with PMHX of UTI (6 weeks ago) who presents to the emergency department C/O recurrent burning dysuria onset 3 days ago. Associated sxs include increased frequency. Pt was seen at [facility] for his previous UTI  About 6 weeks ago and was prescribed a drug BID for 10 days, and states that sxs are consistent with UTI. Pt is seen by [name], MD, Urologist for BPH and urinary urgency. Pt states that he gets HA when he takes Sulfa. Pt states that he has "rampant" FHx of prostate CA. Pt denies fever, testicular pain, rash or swelling in the genitals, abdominal pain, and any other sxs or complaints. sx uti, ua shows pyuria, culture set up Rx keflex  CULTURE SHOWS E COLI pans sensitive including the keflex he is taking  04/18/2016 telephone encounter - lynn/cs - work together on this - call pt to let him know has appt this thursday at 0900 - should have culture back by then, pt on keflex, reassure him that Dr Nicanor Alcon aware of his ER visit, please make sure the urine culture from [id number] is followed up on to make sure pt is on correct abx  Thanks/[id number]   04/21/2016 office encounter  - pt comes in for fu check after recent uti treated at [facility] ed, interestingly, he had [facility] - suspect inadequate treatment with first uti; sxs resolved  now; urine benign appearing exam benign; discussed an extended course of cipro 250 bid for two weeks along with probiotic to clear out any residual bacteria in the prostate; discussed black box warning for cipro, discussed importance of probiotic; will stop keflex now and start the cipro;       Family Hx of Prostate Cancer   11/19/2009 office encounter - reports 3 brothers with pca  12/06/2010 office encounter  - now his 59 yo son has been diagnosed with pca;  12/25/2012 office encounter  -  Sig fam hx of pca;       BPH w LUTS   Early 2000's microwave treatment - successful   [id number] Burnadette Peter) notes This is a follow-up visit for Jose Stafford who has an established diagnosis of BPH with obstruction. There has been improvement of his symptoms. He reports nocturia one time a night. His current medications includes no medications. He has not experienced Problems when on these medications. Since his last visit he notes less frequency less nocturia less urgency   [id number] Burnadette Peter) notes There has been improvement of his symptoms. He reports nocturia is rare now.  He is bothered by some daytime urgency, which has become more noticeable over the past 6 mos. His current medications includes no medications. Since his last visit he notes less frequency less nocturia But more daytime urgency; PLAN Sanctura trial   There has been further improvement of his symptoms. He reports nocturia is rare now, and not more than once nightly. He was bothered by some daytime urgency, but began Sanctura with good improvement.   [id number] Burnadette Peter) notes His current medications includes no BPH medications. He continues on Reunion, with good results.   [id number] Burnadette Peter) notes There has been improvement of his symptoms. He reports nocturia one or two times a night. He is emptying well, with a PVR of 310 cc today. He is reasonably happy with his voiding despite this. His current medications includes Sanctura 60 mgm qHS. He has not experienced  side effects on this medication.   [id number] [name]) notes irritable sxs controlled with sanctura, notes 40 gm benign prostate   10/04/2009 chart entry - chart reviewed, note pt has rx for sudafed and is on sanctura with 40 gm prostate - will discuss impact of this rx on bph sxs and increased pvr;   11/19/2009 office encounter - bph surveillance, reviewed risk of sudafed with bph - uses rarely; remains on sanctura xr on empty stomach, pleased with bladder control; discussed trial of cessation if he would like since doing; denies hematuria or dysuria; urine is dip and micro negative, exam shows 30 gm benign prostate l>r wo Nodules; ok to trial cesation of sanctura if he'd like   05/01/2010 chart entry ??? changed insurance needs alternative for sanctura since not on his insurance approved list, meds listed include oxybutynin, oxybutynin er 5, 10, 15, enablex, gelnique, vesicare; will go with vesicare 10 mg #30 [id number], sent to [facility] pharmacy  05/06/2010 chart entry ??? vesicare too expensive, pt wants generic, pt advised of short term memory loss associated with oxybutynin; Rx oxbutynin er 10 mg daily #30 [id number]  05/27/2010 office encounter  - pt opted to stay w vesicare and avoid side effects with the oxybutynin; his AUA symptom score (detailed in the documents flowsheet) is only 2 on vesicare; pt pleased; will titrate dose to every other and then every third; whatever minimum dosage is effective;   12/06/2010 office encounter  - weaned off vesicare, his AUA symptom score (detailed in the documents flowsheet) is 7; denies hematuria or dysuria; ua dip and micro negative;   12/08/2011 office encounter  - bph surveillance, no meds; pt satisfied with voiding; his AUA symptom score (detailed in the documents flowsheet) is 4, denies hematuria or dysuria; ua dip and micro negative; occl urgency  06/18/2012 office encounter  - not on any meds right now, stream a little slower; his AUA symptom score (detailed in the documents flowsheet) is 9;  denies hematuria or dysuria; ua dip and micro negative; exam shows 30 gm benign prostate wo nodules or induration; will check pvr; pt defers any meds for now; if beomes bothered will call and we will trial flomax;   12/25/2012 office encounter  - bph surveillance, mild bother, pt defers meds in spite of stop and start nature to stream;   07/09/2013 office encounter  - bph surveillance, has enlarged prostate, tolerates his auass 12 ok; denies hematuria or dysuria;   09/27/2013 medical assistant encounter - psa increased to 4.44, spiked to 5.09 last fall so no particular worry here, pvr 80 cc, ua benign;    12/23/2013 office encounter  -  bph /luts still some bother but not more so than his anxiety about pca and rising psa; his AUA symptom score (detailed in GU DATA/REVIEW OF LABS AND IMAGING below) is 18; \\  08/05/2014 office encounter  - bph surveillance, his AUA symptom score (detailed in GU DATA/REVIEW OF LABS AND IMAGING below) is 13, tolerates, denies gross hematuria or dysuria; ua dip and micro negative;  01/27/2015 office encounter  - denies gross hematuria or dysuria; ua dip and micro negative; his AUA symptom score (detailed in GU DATA/REVIEW OF LABS AND IMAGING below) is 18, offered alpha blocker pt declines  07/30/2015 office encounter  - bph surveillance, nlo meds and satisfied with voidning; his AUA symptom score (detailed in GU DATA/REVIEW OF LABS AND IMAGING below) is 11, denies hematuria or dysuria; ua dip and micro negative;  02/25/2016 office encounter  - bph surveillance, his AUA symptom score (detailed in GU DATA/REVIEW OF LABS AND IMAGING below) is 9 denies hematuria or dysuria; urine shows clear;       PAST MEDICAL HISTORY    Past Surgical History:   Procedure Laterality Date   ??? CARDIAC SURG PROCEDURE UNLIST     ??? HX ORTHOPAEDIC     ??? HX OTHER SURGICAL      subdural hematoma   ??? HX SHOULDER ARTHROSCOPY      bilateral        Past Medical History:   Diagnosis Date   ??? Anemia     ??? Atrial fibrillation (HCC)    ??? Benign localized hyperplasia of prostate with urinary obstruction and other lower urinary tract symptoms (LUTS)(600.21)    ??? BPH without obstruction/lower urinary tract symptoms    ??? Elevated PSA    ??? Family hx of prostate cancer    ??? Incomplete bladder emptying    ??? PVC (premature ventricular contraction)    ??? Sleep apnea    ??? Urgency of urination    ??? Urgency of urination      Current Outpatient Prescriptions   Medication Sig Dispense Refill   ??? ascorbic acid, vitamin C, (VITAMIN C) 500 mg tablet Take  by mouth.     ??? donepezil (ARICEPT) 10 mg tablet Take 10 mg by mouth nightly.     ??? ferrous sulfate ER (IRON) 160 mg (50 mg iron) TbER tablet Take 1 Tab by mouth daily.     ??? cephALEXin (KEFLEX) 500 mg capsule Take 1 Cap by mouth two (2) times a day for 7 days. 14 Cap 0   ??? cyanocobalamin (VITAMIN B-12) 2,500 mcg sublingual tablet Take 2,500 mcg by mouth daily.     ??? simvastatin (ZOCOR) 20 mg tablet   1   ??? sertraline (ZOLOFT) 50 mg tablet   3   ??? OMEPRAZOLE (PRILOSEC PO) Take  by mouth.     ??? acetaminophen (TYLENOL) 325 mg tablet Take  by mouth every four (4) hours as needed.     ??? montelukast (SINGULAIR) 10 mg tablet Take 10 mg by mouth daily.       ??? Cetirizine (ZYRTEC) 10 mg Cap Take  by mouth.       ??? aspirin delayed-release 81 mg tablet Take  by mouth daily.       ??? GUAIFENESIN (MUCINEX PO) Take  by mouth.         Allergies   Allergen Reactions   ??? Milk Contact Dermatitis   ??? Other Plant, Educational psychologist, Environmental Other (comments)     Pollens, trees, dust causes  chronic rhinitis and congestion   ??? Sulfa (Sulfonamide Antibiotics) Unknown (comments)   ??? Tolmetin Other (comments)     Family History   Problem Relation Age of Onset   ??? Cancer Other    ??? Hypertension Other        Social  History   Smoking Status   ??? Never Smoker   Smokeless Tobacco   ??? Never Used     History   Alcohol Use   ??? 0.5 oz/week   ??? 1 Glasses of wine per week     Comment: very rare        REVIEW OF SYSTEMS   Constitutional: Fever: No  Skin: Rash: No  HEENT: Hearing difficulty: No  Eyes: Blurred vision: No  Cardiovascular: Chest pain: No  Respiratory: Shortness of breath: No  Gastrointestinal: Nausea/vomiting: No  Musculoskeletal: Back pain: No  Neurological: Weakness: No  Psychological: Memory loss: No  Comments/additional findings:      PHYSICAL EXAM  Visit Vitals   ??? BP 116/70   ??? Ht 5\' 7"  (1.702 m)   ??? Wt 151 lb 6.4 oz (68.7 kg)   ??? BMI 23.71 kg/m2     GENERAL APPEARANCE:  83 y.o. Caucasian  male alert, cooperative, no distress, appears stated age not obese;  ABDOMEN:  rounded soft, non-tender. Bowel sounds normal. No masses,  no organomegaly  BACK:  symmetric, no curvature. ROM normal. No CVA tenderness.  GENITOURINARY:  Bladder not palpable nontender        GU DATA/REVIEW OF LABS AND IMAGING    AUA Assessment Score:  AUA Score: 22;    AUA Bother Rating: Mixed-about equally satisfied    AUA Symptom Score 04/21/2016   Over the past month how often have you had the sensation that your bladder was not completely empty after you finished urinating? 3   Over the past month, how often have had to urinate again less than 2 hours after you last finished urinating? 3   Over the past month, how often have you found you stopped and started again several times when you urinated? 4   Over the past month, how often have you found it difficult to postpone urination? 2   Over the past month, how often have you had a weak urinary stream? 4   Over the past month, how often have you had to push or strain to begin urinating? 3   Over the past month, how many times did you most typically get up to urinate from the time you went to bed at night until the time you got up in the morning? 3   AUA Score 22   If you were to spend the rest of your life with your urinary condition the way it is now, how would you feel about that? Mixed-about equally satisfied       Results for orders placed or performed in visit on 04/21/16    AMB POC URINALYSIS DIP STICK AUTO W/ MICRO (MICRO RESULTS)   Result Value Ref Range    Color (UA POC) Yellow     Clarity (UA POC) Clear     Glucose (UA POC) Negative Negative    Bilirubin (UA POC) Negative Negative    Ketones (UA POC) Negative Negative    Specific gravity (UA POC) 1.015 1.001 - 1.035    Blood (UA POC) Negative Negative    pH (UA POC) 6.0 4.6 - 8.0    Protein (UA POC) Negative Negative  Urobilinogen (UA POC) 0.2 mg/dL 0.2 - 1    Nitrites (UA POC) Negative Negative    Leukocyte esterase (UA POC) Trace Negative    Epithelial cells (UA POC)      WBCs (UA POC) 0      RBCs (UA POC) 0      Bacteria (UA POC) None Negative    Crystals (UA POC)  Negative    Other (UA POC)     AMB POC PVR, MEAS,POST-VOID RES,US,NON-IMAGING   Result Value Ref Range    PVR 0 ml cc     Copy to [name], MD and other appropriate care team members eFaxed today.     [name], MD  04/21/2016

## 2016-04-21 NOTE — Progress Notes (Signed)
[name] presents today for PVR per Dr. Nicanor AlconLasater order.  Patient's identity has been verified.   Dr. Nicanor AlconLasater was available in the clinic as incident to provider.     Patient states that He has emptied their bladder to the best of their ability.     PVR today is 0 ml.       Results for orders placed or performed in visit on 04/21/16   AMB POC URINALYSIS DIP STICK AUTO W/ MICRO (MICRO RESULTS)   Result Value Ref Range    Color (UA POC) Yellow     Clarity (UA POC) Clear     Glucose (UA POC) Negative Negative    Bilirubin (UA POC) Negative Negative    Ketones (UA POC) Negative Negative    Specific gravity (UA POC) 1.015 1.001 - 1.035    Blood (UA POC) Negative Negative    pH (UA POC) 6.0 4.6 - 8.0    Protein (UA POC) Negative Negative    Urobilinogen (UA POC) 0.2 mg/dL 0.2 - 1    Nitrites (UA POC) Negative Negative    Leukocyte esterase (UA POC) Trace Negative    Epithelial cells (UA POC)      WBCs (UA POC) 0      RBCs (UA POC) 0      Bacteria (UA POC) None Negative    Crystals (UA POC)  Negative    Other (UA POC)     AMB POC PVR, MEAS,POST-VOID RES,US,NON-IMAGING   Result Value Ref Range    PVR 0 ml cc       Orders Placed This Encounter   ??? AMB POC URINALYSIS DIP STICK AUTO W/ MICRO (MICRO RESULTS)   ??? AMB POC PVR, MEAS,POST-VOID RES,US,NON-IMAGING   ??? ascorbic acid, vitamin C, (VITAMIN C) 500 mg tablet     Sig: Take  by mouth.   ??? donepezil (ARICEPT) 10 mg tablet     Sig: Take 10 mg by mouth nightly.       Jose ShutterAlice Williams

## 2016-05-30 ENCOUNTER — Institutional Professional Consult (permissible substitution): Admit: 2016-05-30 | Discharge: 2016-05-30 | Payer: MEDICARE | Primary: Family Medicine

## 2016-05-30 DIAGNOSIS — N390 Urinary tract infection, site not specified: Secondary | ICD-10-CM

## 2016-05-30 LAB — AMB POC URINALYSIS DIP STICK AUTO W/ MICRO (MICRO RESULTS)
Bilirubin (UA POC): NEGATIVE
Blood (UA POC): NEGATIVE
Glucose (UA POC): NEGATIVE
Nitrites (UA POC): NEGATIVE
Protein (UA POC): NEGATIVE
RBCs (UA POC): 0
Specific gravity (UA POC): 1.015 (ref 1.001–1.035)
Urobilinogen (UA POC): 0.2 (ref 0.2–1)
WBCs (UA POC): 0
pH (UA POC): 5.5 (ref 4.6–8.0)

## 2016-05-30 LAB — AMB POC PVR, MEAS,POST-VOID RES,US,NON-IMAGING: PVR: 105 cc

## 2016-05-30 NOTE — Progress Notes (Addendum)
BPH w LUTS/Hx of Elevated PSA/Family Hx of PCA death   2007/05/20 psa 1.9; 081009 psa 2.3; 020810 psa 1.6; 080910 psa 2.2; 030111 psa 3.1; 161096 psa 2.9; 045409 psa 2.23; 811914 psa 2.5; 032513 psa 2.2; 782956 psa 2.2;  213086 psa 2.4; 100714 psa 5.09,%free psa 19%; 102814 psa 2.3,%free psa 33.5%; 578469 psa 2.78 %free psa 32%; 071715 psa 4.44; 093015 psa 5.81 %free psa 24%; 052416 psa 2.91 %free psa 32%; 629528 psa 2.4 %free psa 30%; 110117 psa 3.0, %free psa 33%;        10/04/2009 chart entry - pt has fu appt with me in September having been referred by Dr Burnadette Peter per his retirement; 83 yo with normal psa but abnormal psa velocity; has bph;   11/19/2009 office encounter - Preexam psa pending, pt to call if hasn't heard psa report by two weeks; exam shows 30 gm prostate, l>r wo nodules; reassured pt no sign of cancer; copy of psa to pcp planned;   Psa 2.9, reassuring letter to patient, copy of report to Dr Donette Larry   05/20/2010 medical assistant encounter - psa draw 2.23;  appt near future  05/27/2010 office encounter  - elev psa surveillance in pt with family hx of sig pca; psa range is 1.9 - 3.1, present 2.23; pt anxious about his psa and family hx wants six month checks; urine is micro benign; exam shows 30 gm prostate r>l wo nodules or induration; reassured pt no sign of prostate cancer but at risk due to age and family hx; reassured him pca death unlikely for him;   12-27-10 medical assistant encounter - psa draw, reassuring stable value of 2.5; appt near future  12/06/2010 office encounter  - elevated psa hx in pt with strong fam hx of pca - brothers and his son and 3 nephews all with pca; reviewed his psa hx which is reassuring with stable value of 2.5; pt wo sxs; feels great - even though 57 he wishes active surveillance; exam shows 30 gm prostate r>l wo nodules or induration; will continue six month surveillance  06/06/2011 medical assistant encounter - psa 2.2, cw, let pt know his psa  improved normal at 2.2; arrange appt with me in six months for " ua surveillance bph luts fam hx pca " - i recommend no psa testing; if he insists on psa testing, then get psa level two week prior;   12/08/2011 office encounter  - extensive fam hx of pca; here for fu exam, psa's have been normal recently so pt deferred psa testing; exam shows 30 gm prostate R>L  wo nodules; continue six months surveillance  psa was drawn for some reason, psa 2.2; no more psa's, letter to patient  06/18/2012 office encounter  - pt insists on continued psa checks in addition to prostate exam due to strong family hx of pca death; exam shows 30 gm prostate l>r wo nodules or induration; psa currently is 2.4, reviewed psa hx with im, recheck in six months;   12/25/2012 office encounter  - pt concerned about psa jump from 2.4 to 5; has sig fam hx pca; appetite good, weight stable, no unusual bone or joint pains; 30 gm prostate l>r wo nodules or induration; will recheck f/t psa in thee months, as long as stable, then fu exam in six months with f/t psa  01/08/2013 medical assistant encounter - pt not willing to wait three months for recheck psa, repeat psa 2.3 with reassuring %free psa 33.5%  01/14/2013 telephone encounter -  debbie, call pt, im sure he already knows value but his repeat psa 2.3 and %free psa is normal 33.5%, i recommend no further psa testing but pt insists on psa testing; cancel planned appt in January for psa testing; ask him when he wants his next psa done? Likely arrange nurse appt mid April for f/t psa and exam with me late April for " pvr/ua surveillance fam hx pca bph luts ibe (confirm f/t psa prior) "   06/12/2013 medical assistant encounter - psa normal 2.78 %free psa 32%; pt has an invalid appt for april 8th, this was missed on checkout april 1st and not rescheduled to a valid date and appt time; [name], please make sure this gets changed on Monday apirl 6th so pt doesn't come to office expecting to be seen on Wednesday   07/09/2013 office encounter  - current psa 2.78, pt with fam hx of pca in two brothers, one son with pca, one died of pca; pt has high anxiety about pca; reviewed his psa hx in detail with him, asided from transient spike last october, psa have been very reassuringly normal and with reassuring %free psa; 3xam today shows 35 gm benign prostate wo nodules or induration; pt wants continued six month surveillance with f/t psa and exam;   09/27/2013 medical assistant encounter - psa increased to 4.44, spiked to 5.09 last fall so no particular worry here, pvr 80 cc, ua benign;    09/30/2013 telephone encounter - debbie, not sure why he came in for psa at this time - ask him reason when you call him; yes, his psa did bump up a bit to 4.44 but that is not concerning, had spike last fall to 5.09; he has appts for f/t psa in late september and fu exam mid october; also let him know his urine ok and pvr ok;   12/23/2013 office encounter  - psa has increased to 5.81 with favorable %free psa 24% - have finally reached his level of intolerance of my recommended ww/active surveillance; pt has fam hx of pca death and is very apprehensive about this rise in psa; has seen [facility] and [facility]ch in past; discussed options - he understands risks of nbxp including death from sepsis; he would like to see oncologist; will arrange for him to see Dr Given; offered him 12 core biopsy today; discussed targeted biopsy but best to go with 12 core first; happy to see him in fu and do biopsy pending his discussion with Dr Given; pt understands reluctance to proceed with radiation therapy at age 76 with its risks and complications; certainly would not entertain surgery; will see what Dr Given has to say.  Exam today shows 35 gm benign prostate wo nodules or induration, left lobe maybe slightly larger than right   [id number] [facility] ONC CONSULT GIVEN notes IMPRESSION: 1. Variable PSA, highest of  5.81ng/ml in 11/2013 and most recently 3.3ng/ml in 12/2013. Has been on active surveillance in the past with Dr. Nicanor Alcon but requested consultation with oncology. I advised pt at his age that he should not get too excited about his PSA, in fact the guidelines would not advise further screening. 2. BPH with mild LUTS, specifically weak FOS, followed by Dr. Nicanor Alcon PLAN: 1. FU with Dr. Nicanor Alcon q 6 months as planned   08/05/2014 office encounter  - Preexam F/T PSA pending, pt to call if hasn't heard F/T PSA report by two weeks; sig fam hx of pca; exam shows 40 gm benign prostate wo  nodules or induration; will make fu plans pending current psa results  psa 2.91, %free psa 32%; letter to patient and results to pcp, gave pt choice of fu six months or a year.   01/27/2015 office encounter  - hx of elev psa, fam hx pca, pt has high anxiety, current psa favorable 2.69 and %free psa favorable 31%; urine benign; exam shows 35 gm prostate L>R wo nodules or induration; pt wants six month surveillance   07/30/2015 office encounter  - hx of elev psa surveillance and fam hx of pca; pt has high anxiety regarding pca and insists on surveillance; , current psa 2.4 %free psa 30%; urine benign, exam shows 30 gm benign prostate l>r wo nodules or induration; pt wants six month surveillance  02/25/2016 office encounter  - continues with personal requested six month surveillance with exam and psa due to fam hx pca death; current psa stable 3.0 with reassuring %free psa 33%; denies hematuria or dysuria; ua dip and micro negative; exam shows 30 gm benign prostate wo nodules or induration; reassured pt no sign of pca risk other than his fam hx;   [id number] [facility] ED notes Demarko Zeimet is a 83 y.o. male with PMHX of UTI (6 weeks ago) who presents to the emergency department C/O recurrent burning dysuria onset 3 days ago. Associated sxs include increased frequency. Pt was seen at [facility] for his previous UTI  About 6 weeks ago and was  prescribed a drug BID for 10 days, and states that sxs are consistent with UTI. Pt is seen by [name], MD, Urologist for BPH and urinary urgency. Pt states that he gets HA when he takes Sulfa. Pt states that he has "rampant" FHx of prostate CA. Pt denies fever, testicular pain, rash or swelling in the genitals, abdominal pain, and any other sxs or complaints. sx uti, ua shows pyuria, culture set up Rx keflex  04/18/2016 telephone encounter - lynn/cs - work together on this - call pt to let him know has appt this thursday at 0900 - should have culture back by then, pt on keflex, reassure him that Dr Nicanor Alcon aware of his ER visit, please make sure the urine culture from [id number] is followed up on to make sure pt is on correct abx  Thanks/[id number]      IBE /Weak Stream  POST VOID RESIDUAL  10/22/2007  04/21/2008  10/20/2008    PVR  72ml  58cc  310   11/19/2009 office encounter - auass 4, pvr 45 cc, pt wo bother   06/18/2012 office encounter  - auass 9, stream a little slower; pvr 163 cc (initially 269, had pt double void and pvr 163 cc) will recheck pvr in a month;   12/25/2012 office encounter  - auass 13, not bothered, pvr 184 cc;   07/09/2013 office encounter  - auass 12, pvr 98 cc, defers meds  09/27/2013 medical assistant encounter - pvr 80 cc  12/23/2013 office encounter  - auass 18; pvr 32 cc, some bother  02/25/2016 office encounter  - auass 9, no bother, no meds;       UTI  [id number] [facility] ED notes Jose Stafford is a 83 y.o. male with PMHX of UTI (6 weeks ago) who presents to the emergency department C/O recurrent burning dysuria onset 3 days ago. Associated sxs include increased frequency. Pt was seen at [facility] for his previous UTI  About 6 weeks ago and was prescribed a drug BID for 10 days, and  states that sxs are consistent with UTI. Pt is seen by [name], MD, Urologist for BPH and urinary urgency. Pt states that he gets HA when he takes Sulfa. Pt states that he  has "rampant" FHx of prostate CA. Pt denies fever, testicular pain, rash or swelling in the genitals, abdominal pain, and any other sxs or complaints. sx uti, ua shows pyuria, culture set up Rx keflex  CULTURE SHOWS E COLI pans sensitive including the keflex he is taking  04/18/2016 telephone encounter - lynn/cs - work together on this - call pt to let him know has appt this thursday at 0900 - should have culture back by then, pt on keflex, reassure him that Dr Nicanor Alcon aware of his ER visit, please make sure the urine culture from [id number] is followed up on to make sure pt is on correct abx  Thanks/[id number]   04/21/2016 office encounter  - pt comes in for fu check after recent uti treated at [facility] ed, interestingly, he had [facility] - suspect inadequate treatment with first uti; sxs resolved now; urine benign appearing exam benign; discussed an extended course of cipro 250 bid for two weeks along with probiotic to clear out any residual bacteria in the prostate; discussed black box warning for cipro, discussed importance of probiotic; will stop keflex now and start the cipro;   05/30/2016 medical assistant encounter - here for fu ua, wo sxs, urine clear, pvr 105 cc; culture set up  CULTURE no sig growth, cs, let pt know there was not bacteria growth and cultue and his urine looked clear; if doesn't have six month fu appt as pt requested in december, then arrange appt for " f/t psa//pvr/ua surveillance bph fam hx pca death hx uti hx of ibe" in mid june/[id number]          Family Hx of Prostate Cancer   11/19/2009 office encounter - reports 3 brothers with pca  12/06/2010 office encounter  - now his 36 yo son has been diagnosed with pca;  12/25/2012 office encounter  -  Sig fam hx of pca;       BPH w LUTS   Early 2000's microwave treatment - successful   [id number] Burnadette Peter) notes This is a follow-up visit for Jose Stafford who has an established diagnosis of BPH with obstruction. There has been  improvement of his symptoms. He reports nocturia one time a night. His current medications includes no medications. He has not experienced Problems when on these medications. Since his last visit he notes less frequency less nocturia less urgency   [id number] Burnadette Peter) notes There has been improvement of his symptoms. He reports nocturia is rare now. He is bothered by some daytime urgency, which has become more noticeable over the past 6 mos. His current medications includes no medications. Since his last visit he notes less frequency less nocturia But more daytime urgency; PLAN Sanctura trial   There has been further improvement of his symptoms. He reports nocturia is rare now, and not more than once nightly. He was bothered by some daytime urgency, but began Sanctura with good improvement.   [id number] Burnadette Peter) notes His current medications includes no BPH medications. He continues on [place]union, with good results.   [id number] Burnadette Peter) notes There has been improvement of his symptoms. He reports nocturia one or two times a night. He is emptying well, with a PVR of 310 cc today. He is reasonably happy with his voiding despite  this. His current medications includes Sanctura 60 mgm qHS. He has not experienced side effects on this medication.   [id number] [name]) notes irritable sxs controlled with sanctura, notes 40 gm benign prostate   10/04/2009 chart entry - chart reviewed, note pt has rx for sudafed and is on sanctura with 40 gm prostate - will discuss impact of this rx on bph sxs and increased pvr;   11/19/2009 office encounter - bph surveillance, reviewed risk of sudafed with bph - uses rarely; remains on sanctura xr on empty stomach, pleased with bladder control; discussed trial of cessation if he would like since doing; denies hematuria or dysuria; urine is dip and micro negative, exam shows 30 gm benign prostate l>r wo Nodules; ok to trial cesation of sanctura if he'd like    05/01/2010 chart entry ??? changed insurance needs alternative for sanctura since not on his insurance approved list, meds listed include oxybutynin, oxybutynin er 5, 10, 15, enablex, gelnique, vesicare; will go with vesicare 10 mg #30 rfx11, sent to [facility] pharmacy  05/06/2010 chart entry ??? vesicare too expensive, pt wants generic, pt advised of short term memory loss associated with oxybutynin; Rx oxbutynin er 10 mg daily #30 rfx11  05/27/2010 office encounter  - pt opted to stay w vesicare and avoid side effects with the oxybutynin; his AUA symptom score (detailed in the documents flowsheet) is only 2 on vesicare; pt pleased; will titrate dose to every other and then every third; whatever minimum dosage is effective;   12/06/2010 office encounter  - weaned off vesicare, his AUA symptom score (detailed in the documents flowsheet) is 7; denies hematuria or dysuria; ua dip and micro negative;   12/08/2011 office encounter  - bph surveillance, no meds; pt satisfied with voiding; his AUA symptom score (detailed in the documents flowsheet) is 4, denies hematuria or dysuria; ua dip and micro negative; occl urgency  06/18/2012 office encounter  - not on any meds right now, stream a little slower; his AUA symptom score (detailed in the documents flowsheet) is 9; denies hematuria or dysuria; ua dip and micro negative; exam shows 30 gm benign prostate wo nodules or induration; will check pvr; pt defers any meds for now; if beomes bothered will call and we will trial flomax;   12/25/2012 office encounter  - bph surveillance, mild bother, pt defers meds in spite of stop and start nature to stream;   07/09/2013 office encounter  - bph surveillance, has enlarged prostate, tolerates his auass 12 ok; denies hematuria or dysuria;   09/27/2013 medical assistant encounter - psa increased to 4.44, spiked to 5.09 last fall so no particular worry here, pvr 80 cc, ua benign;     12/23/2013 office encounter  - bph /luts still some bother but not more so than his anxiety about pca and rising psa; his AUA symptom score (detailed in GU DATA/REVIEW OF LABS AND IMAGING below) is 18; \\  08/05/2014 office encounter  - bph surveillance, his AUA symptom score (detailed in GU DATA/REVIEW OF LABS AND IMAGING below) is 13, tolerates, denies gross hematuria or dysuria; ua dip and micro negative;  01/27/2015 office encounter  - denies gross hematuria or dysuria; ua dip and micro negative; his AUA symptom score (detailed in GU DATA/REVIEW OF LABS AND IMAGING below) is 18, offered alpha blocker pt declines  07/30/2015 office encounter  - bph surveillance, nlo meds and satisfied with voidning; his AUA symptom score (detailed in GU DATA/REVIEW OF LABS AND IMAGING below) is 11,  denies hematuria or dysuria; ua dip and micro negative;  02/25/2016 office encounter  - bph surveillance, his AUA symptom score (detailed in GU DATA/REVIEW OF LABS AND IMAGING below) is 9 denies hematuria or dysuria; urine shows clear;

## 2016-05-30 NOTE — Progress Notes (Signed)
Jose Stafford presents today for PVR per Dr. Nicanor Alcon order.  Patient's identity has been verified.   Dr. Nicanor Alcon was available in the clinic as incident to provider.     Patient states that He has emptied their bladder to the best of their ability.     PVR today is 105.       Results for orders placed or performed in visit on 05/30/16   AMB POC URINALYSIS DIP STICK AUTO W/ MICRO (MICRO RESULTS)   Result Value Ref Range    Color (UA POC) Yellow     Clarity (UA POC) Clear     Glucose (UA POC) Negative Negative    Bilirubin (UA POC) Negative Negative    Ketones (UA POC) Trace Negative    Specific gravity (UA POC) 1.015 1.001 - 1.035    Blood (UA POC) Negative Negative    pH (UA POC) 5.5 4.6 - 8.0    Protein (UA POC) Negative Negative    Urobilinogen (UA POC) 0.2 mg/dL 0.2 - 1    Nitrites (UA POC) Negative Negative    Leukocyte esterase (UA POC) Trace Negative    Epithelial cells (UA POC)      WBCs (UA POC) 0      RBCs (UA POC) 0      Bacteria (UA POC) None Negative    Crystals (UA POC)  Negative    Other (UA POC)     AMB POC PVR, MEAS,POST-VOID RES,US,NON-IMAGING   Result Value Ref Range    PVR 105 cc       Orders Placed This Encounter   ??? URINE C&S     Order Specific Question:   Specify all ANTIBIOTIC ALLERGIES:     Answer:   Sulfa (Bactrim)     Order Specific Question:   Specify the urine source     Answer:   Voided   ??? AMB POC URINALYSIS DIP STICK AUTO W/ MICRO (MICRO RESULTS)   ??? AMB POC PVR, MEAS,POST-VOID RES,US,NON-IMAGING       Jose Stafford     Jose Stafford is here today per Dr. Nicanor Alcon to give a urine specimen.       He is here for repeat culture      Urine is obtained from patient via clean catch.     Patient is symptomatic: no  Patient complains of: no complaints   Patient denies fever, chills    Urine was sent for culture.   This is a repeat urine culture.   UA performed: yes    Results for orders placed or performed in visit on 05/30/16   AMB POC URINALYSIS DIP STICK AUTO W/ MICRO (MICRO RESULTS)    Result Value Ref Range    Color (UA POC) Yellow     Clarity (UA POC) Clear     Glucose (UA POC) Negative Negative    Bilirubin (UA POC) Negative Negative    Ketones (UA POC) Trace Negative    Specific gravity (UA POC) 1.015 1.001 - 1.035    Blood (UA POC) Negative Negative    pH (UA POC) 5.5 4.6 - 8.0    Protein (UA POC) Negative Negative    Urobilinogen (UA POC) 0.2 mg/dL 0.2 - 1    Nitrites (UA POC) Negative Negative    Leukocyte esterase (UA POC) Trace Negative    Epithelial cells (UA POC)      WBCs (UA POC) 0      RBCs (UA POC) 0  Bacteria (UA POC) None Negative    Crystals (UA POC)  Negative    Other (UA POC)     AMB POC PVR, MEAS,POST-VOID RES,US,NON-IMAGING   Result Value Ref Range    PVR 105 cc        Urine was sent to [facility] for processing of urine culture.   Patient is informed that it will be at least 48 hours before results are available     Orders Placed This Encounter   ??? URINE C&S     Order Specific Question:   Specify all ANTIBIOTIC ALLERGIES:     Answer:   Sulfa (Bactrim)     Order Specific Question:   Specify the urine source     Answer:   Voided   ??? AMB POC URINALYSIS DIP STICK AUTO W/ MICRO (MICRO RESULTS)   ??? AMB POC PVR, MEAS,POST-VOID RES,US,NON-IMAGING       Jose Stafford   Reviewed by Leone PayorJohn Lasater, MD 05/31/2016

## 2016-05-30 NOTE — Progress Notes (Signed)
I spoke with pt and gave him his test results and made a f/u with you appt on August 24, 2016.

## 2016-06-01 ENCOUNTER — Encounter: Primary: Family Medicine

## 2016-06-01 LAB — URINE C&S

## 2016-06-22 ENCOUNTER — Emergency Department: Admit: 2016-06-22 | Payer: MEDICARE | Primary: Family Medicine

## 2016-06-22 ENCOUNTER — Inpatient Hospital Stay
Admit: 2016-06-22 | Discharge: 2016-06-22 | Disposition: A | Discharge status: Home / Self Care | Payer: MEDICARE | Attending: Emergency Medicine

## 2016-06-22 DIAGNOSIS — S92354A Nondisplaced fracture of fifth metatarsal bone, right foot, initial encounter for closed fracture: Secondary | ICD-10-CM

## 2016-06-22 NOTE — ED Triage Notes (Addendum)
Pt states tripped last night and twisted rt foot, pt c/o lateral aspect of foot hurting. Pt reports pain when walking  Pt states he felt a little dizzy this am when getting up, pt reports he drank 4 glasses of water.  Sepsis Screening completed    (  )Patient meets SIRS criteria.  (x  )Patient does not meet SIRS criteria.      SIRS Criteria is achieved when two or more of the following are present  ? Temperature < 96.8??F (36??C) or > 100.9??F (38.3??C)  ? Heart Rate > 90 beats per minute  ? Respiratory Rate > 20 breaths per minute  ? WBC count > 12,000 or <4,000 or > 10% bands

## 2016-06-22 NOTE — ED Notes (Signed)
Pt c.o R foot pain, states he tripped and hit R foot by toe #5. Pt has +PMS in all extremities. Some redness, no open wounds. Pt reports he has felt dizzy waking up in the am states "thats not unusual for me though". No neuro deficits noted. No other complaints.

## 2016-06-22 NOTE — ED Notes (Signed)
I have reviewed discharge instructions with the patient.  The patient verbalized understanding.

## 2016-06-22 NOTE — ED Provider Notes (Signed)
EMERGENCY DEPARTMENT HISTORY AND PHYSICAL EXAM    Date: 06/22/2016  Patient Name: Jose Stafford    History of Presenting Illness     Chief Complaint   Patient presents with   ??? Foot Injury   ??? Dizziness         History Provided By: Patient    Chief Complaint: foot injury  Duration: 14 Hours  Timing:  Progressive  Location: right foot  Quality: Aching and Sharp  Severity: 8 out of 10  Modifying Factors: very painful to put pressure on foot  Associated Symptoms: dizziness    Additional History (Context):   11:16 AM    Jose Stafford is a 83 y.o. male with PMHX of anemia and arthritis who presents to the emergency department C/O right foot injury. Associated sxs include dizziness. Pt reports he tripped on his toe last night causing him step on his right foot wrong causing pain. Pt states it is very painful to put pressure on his foot. He states he walks on his heels to prevent pain. Pt did not fall all the way down. 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 Pt states this morning he felt very dizzy. He drunk 4 glassed of water this morning with mild relief. Pt states he often has these dizzy spells upon waking in the morning (has had them for years per pt). His PCP states it is likely dehydration and his cardiologist has evaluated him for this as well without concern per pt. Pt is currently not dizzy and is ambulatory with only complaint of pain to right foot. BP is usually 90 and 100 systolic per pt. Pt reports his BP will increase/decrease with stress and dehydration. BP is currently 105/51. 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 PSHx includes orthopaedic, cardiac surgery, subdural hematoma, and bilateral shoulder arthroscopy. FMHx of prostate  cancer. Pt denies chest pain, SOB, gait problems, foot swelling, numbness, weakness, recent illness, fever, n/v/d, and any other sxs or complaints.     PCP: Jose Cage, MD    Current Outpatient Prescriptions   Medication Sig Dispense Refill   ??? ascorbic acid, vitamin C, (VITAMIN C) 500 mg tablet Take  by mouth.     ??? ferrous sulfate ER (IRON) 160 mg (50 mg iron) TbER tablet Take 1 Tab by mouth daily.     ??? cyanocobalamin (VITAMIN B-12) 2,500 mcg sublingual tablet Take 2,500 mcg by mouth daily.     ??? simvastatin (ZOCOR) 20 mg tablet   1   ??? sertraline (ZOLOFT) 50 mg tablet   3   ??? OMEPRAZOLE (PRILOSEC PO) Take  by mouth.     ??? acetaminophen (TYLENOL) 325 mg tablet Take  by mouth every four (4) hours as needed.     ??? montelukast (SINGULAIR) 10 mg tablet Take 10 mg by mouth daily.       ??? Cetirizine (ZYRTEC) 10 mg Cap Take  by mouth.       ??? aspirin delayed-release 81 mg tablet Take  by mouth daily.       ??? GUAIFENESIN (MUCINEX PO) Take  by mouth.           Past History     Past Medical History:  Past Medical History:   Diagnosis Date   ??? Anemia    ??? Atrial  fibrillation (HCC)    ??? Benign localized hyperplasia of prostate with urinary obstruction and other lower urinary tract symptoms (LUTS)(600.21)    ??? BPH without obstruction/lower urinary tract symptoms    ??? Elevated PSA    ??? Family hx of prostate cancer    ??? Incomplete bladder emptying    ??? PVC (premature ventricular contraction)    ??? PVC (premature ventricular contraction)    ??? Sleep apnea    ??? Urgency of urination    ??? Urgency of urination        Past Surgical History:  Past Surgical History:   Procedure Laterality Date   ??? CARDIAC SURG PROCEDURE UNLIST     ??? HX ORTHOPAEDIC     ??? HX OTHER SURGICAL      subdural hematoma   ??? HX SHOULDER ARTHROSCOPY      bilateral        Family History:  Family History   Problem Relation Age of Onset   ??? Cancer Other    ??? Hypertension Other        Social History:  Social History   Substance Use Topics    ??? Smoking status: Never Smoker   ??? Smokeless tobacco: Never Used   ??? Alcohol use No      Comment: very rare       Allergies:  Allergies   Allergen Reactions   ??? Milk Contact Dermatitis   ??? Other Plant, Animal, Environmental Other (comments)     Pollens, trees, dust causes chronic rhinitis and congestion   ??? Sulfa (Sulfonamide Antibiotics) Unknown (comments)   ??? Tolmetin Other (comments)         Review of Systems   Review of Systems   Constitutional: Negative for fever.   Respiratory: Negative for shortness of breath.    Cardiovascular: Negative for chest pain.   Gastrointestinal: Negative for diarrhea, nausea and vomiting.   Musculoskeletal: Positive for arthralgias (right foot pain). Negative for joint swelling.   Neurological: Positive for dizziness. Negative for syncope, speech difficulty, weakness and numbness.   All other systems reviewed and are negative.      Physical Exam     Vitals:    06/22/16 1043 06/22/16 1135   BP: 90/56 100/55   Pulse: 85 78   Resp: 16    Temp: 98.2 ??F (36.8 ??C)    SpO2: 99%    Weight: 68 kg (150 lb)    Height:  (1.702 m)      Physical Exam   Constitutional: He is oriented to person, place, and time. He appears well-developed and well-nourished. No distress.   HENT:   Head: Normocephalic and atraumatic.   Eyes: Conjunctivae and EOM are normal. Pupils are equal, round, and reactive to light.   Neck: Normal range of motion. Neck supple.   Cardiovascular: Normal rate and regular rhythm.    Pulmonary/Chest: Effort normal and breath sounds normal.   Musculoskeletal: Normal range of motion. He exhibits tenderness. He exhibits no edema or deformity.        Right ankle: Normal. He exhibits normal pulse.        Right foot: There is tenderness and bony tenderness. There is no swelling, normal capillary refill, no crepitus, no deformity and no laceration.        Feet:    Right Foot: +TTP as shown, 2nd toe surgically absent; NVI     Neurological: He is alert and oriented to person, place, and time. He has normal strength.  No cranial nerve deficit or sensory deficit. Gait normal. GCS eye subscore is 4. GCS verbal subscore is 5. GCS motor subscore is 6.   Skin: Skin is warm and dry.   Psychiatric: He has a normal mood and affect. His behavior is normal.   Nursing note and vitals reviewed.        Diagnostic Study Results     Labs -   No results found for this or any previous visit (from the past 12 hour(s)).    Radiologic Studies -   XR FOOT RT MIN 3 V   Final Result   IMPRESSION:  ??  ??  1. Spiral fracture distal third fifth metatarsal without intra-articular  Extension.    As read by the radiologist.     CT Results  (Last 48 hours)    None        CXR Results  (Last 48 hours)    None          Medications given in the ED-  Medications - No data to display      Medical Decision Making   I am the first provider for this patient.    I reviewed the vital signs, available nursing notes, past medical history, past surgical history, family history and social history.    Vital Signs-Reviewed the patient's vital signs.    Pulse Oximetry Analysis - 99% on RA     Records Reviewed: Old Medical Records    Procedures:  Procedures    ED Course:   11:16 AM  Initial assessment performed. The patients presenting problems have been discussed, and they are in agreement with the care plan formulated and outlined with them.  I have encouraged them to ask questions as they arise throughout their visit.    Pt reports no dizziness at the time of evaluation and he further refuses (though politely) any work up for dizziness. Pt states he came to have his foot xray'ed and only mentioned the diizziness to the triage nurse as part of his medical history. Pt reports work up from his pcp & cardiologist for intermittent dizziness with dx of "dehydration & stress". Pt is A&Ox4, normal exam other than foot tenderness with fracture, is not orthostatic,  has no complaints, & is ambulatory even with the foot fx. He is hopeful for d/c at this time states "my wife has lunch waiting for me". Stable for d/c home, strict return precautions discussed, pt is agreement with plan    Diagnosis and Disposition     DISCHARGE NOTE:  12:01 PM  Jake Shark Kirks's  results have been reviewed with him.  He has been counseled regarding his diagnosis, treatment, and plan.  He verbally conveys understanding and agreement of the signs, symptoms, diagnosis, treatment and prognosis and additionally agrees to follow up as discussed.  He also agrees with the care-plan and conveys that all of his questions have been answered.  I have also provided discharge instructions for him that include: educational information regarding their diagnosis and treatment, and list of reasons why they would want to return to the ED prior to their follow-up appointment, should his condition change. He has been provided with education for proper emergency department utilization.     CLINICAL IMPRESSION:    1. Closed nondisplaced fracture of fifth metatarsal bone of right foot, initial encounter        PLAN:  1. D/C Home  2.   Current Discharge Medication List  3.   Follow-up Information     Follow up With Details Comments Contact Info    Elsworth Soho, MD Schedule an appointment as soon as possible for a visit in 1 day For follow up with podiatry [address] [place]  [address]  [place]  [place] [address]  [phone]      [facility] EMERGENCY DEPT Go to As needed, if symptoms worsen [address]  [place] [address]  [phone]        _______________________________    Attestations:  This note is prepared by Eulis Foster, acting as Scribe for [employer], PA-C.    Rakim Moone, PA-C:  The scribe's documentation has been prepared under my direction and personally reviewed by me in its entirety.  I confirm that the note above accurately reflects all work, treatment, procedures,  and medical decision making performed by me.    _______________________________

## 2016-06-25 LAB — EKG 12-LEAD
Atrial Rate: 77 {beats}/min
Diagnosis: NORMAL
P Axis: 65 degrees
P-R Interval: 194 ms
Q-T Interval: 368 ms
QRS Duration: 90 ms
QTc Calculation (Bazett): 416 ms
R Axis: -37 degrees
T Axis: 58 degrees
Ventricular Rate: 77 {beats}/min

## 2016-06-25 LAB — EKG, 12 LEAD, INITIAL
Atrial Rate: 77 {beats}/min
Calculated P Axis: 65 degrees
Calculated R Axis: -37 degrees
Calculated T Axis: 58 degrees
Diagnosis: NORMAL
P-R Interval: 194 ms
Q-T Interval: 368 ms
QRS Duration: 90 ms
QTC Calculation (Bezet): 416 ms
Ventricular Rate: 77 {beats}/min

## 2016-08-24 ENCOUNTER — Encounter: Attending: Urology | Primary: Family Medicine

## 2016-09-06 ENCOUNTER — Ambulatory Visit: Admit: 2016-09-06 | Discharge: 2016-09-06 | Payer: MEDICARE | Attending: Urology | Primary: Family Medicine

## 2016-09-06 DIAGNOSIS — N401 Enlarged prostate with lower urinary tract symptoms: Secondary | ICD-10-CM

## 2016-09-06 LAB — AMB POC URINALYSIS DIP STICK AUTO W/ MICRO (MICRO RESULTS)
Bilirubin (UA POC): NEGATIVE
Blood (UA POC): NEGATIVE
Crystals (UA POC): NEGATIVE
Glucose (UA POC): NEGATIVE
Ketones (UA POC): NEGATIVE
Leukocyte esterase (UA POC): NEGATIVE
Nitrites (UA POC): NEGATIVE
Protein (UA POC): NEGATIVE
RBCs (UA POC): 0
Specific gravity (UA POC): 1.015 (ref 1.001–1.035)
Urobilinogen (UA POC): 0.2 (ref 0.2–1)
WBCs (UA POC): 0
pH (UA POC): 6 (ref 4.6–8.0)

## 2016-09-06 LAB — AMB POC PVR, MEAS,POST-VOID RES,US,NON-IMAGING: PVR: 160 cc

## 2016-09-06 NOTE — Progress Notes (Addendum)
Jose Heys, MD  [facility]  [address], [address]  [place], [place] [address]  [phone]:  Rashi Giuliani  DOB:  1927-10-25  Date of Service:  09/06/2016    ASSESSMENT/PLAN    Encounter Diagnoses   Name Primary?   ??? Benign prostatic hyperplasia with lower urinary tract symptoms, symptom details unspecified Yes   ??? Family hx of prostate cancer    ??? Incomplete bladder emptying    ??? Slowing of urinary stream        Assessment:  bph w mild luts and tendency to ibe  - not bothered, pt remains concerned about pca due to his fam ily hx; f/t psa pending  Plan:  F/t psa pending, fu six months;     HISTORY OF PRESENT ILLNESS  He is currently 83 y.o. and his primary care provider is [name], MD.    Chief Complaint   Patient presents with   ??? Prostate Cancer       BPH w LUTS/Hx of Elevated PSA/Family Hx of PCA death   April 30, 2007 psa 1.9; 081009 psa 2.3; 020810 psa 1.6; 562130 psa 2.2; 865784 psa 3.1; 696295 psa 2.9; 284132 psa 2.23; 440102 psa 2.5; 725366 psa 2.2; 440347 psa 2.2;  425956 psa 2.4; 100714 psa 5.09,%free psa 19%; 102814 psa 2.3,%free psa 33.5%; 387564 psa 2.78 %free psa 32%; 071715 psa 4.44; 093015 psa 5.81 %free psa 24%; 052416 psa 2.91 %free psa 32%; 332951 psa 2.4 %free psa 30%; 110117 psa 3.0, %free psa 33%; 884166 psa 2.59 %free psa 23%;         10/04/2009 chart entry - pt has fu appt with me in September having been referred by Dr Burnadette Peter per his retirement; 83 yo with normal psa but abnormal psa velocity; has bph;   11/19/2009 office encounter - Preexam psa pending, pt to call if hasn't heard psa report by two weeks; exam shows 30 gm prostate, l>r wo nodules; reassured pt no sign of cancer; copy of psa to pcp planned;   Psa 2.9, reassuring letter to patient, copy of report to Dr Donette Larry   05/20/2010 medical assistant encounter - psa draw 2.23;  appt near future  05/27/2010 office encounter  - elev psa surveillance in pt with family hx  of sig pca; psa range is 1.9 - 3.1, present 2.23; pt anxious about his psa and family hx wants six month checks; urine is micro benign; exam shows 30 gm prostate r>l wo nodules or induration; reassured pt no sign of prostate cancer but at risk due to age and family hx; reassured him pca death unlikely for him;   2010/12/07 medical assistant encounter - psa draw, reassuring stable value of 2.5; appt near future  12/06/2010 office encounter  - elevated psa hx in pt with strong fam hx of pca - brothers and his son and 3 nephews all with pca; reviewed his psa hx which is reassuring with stable value of 2.5; pt wo sxs; feels great - even though 62 he wishes active surveillance; exam shows 30 gm prostate r>l wo nodules or induration; will continue six month surveillance  06/06/2011 medical assistant encounter - psa 2.2, cw, let pt know his psa improved normal at 2.2; arrange appt with me in six months for " ua surveillance bph luts fam hx pca " - i recommend no psa testing; if he insists on psa testing, then get psa level two week prior;   12/08/2011 office encounter  -  extensive fam hx of pca; here for fu exam, psa's have been normal recently so pt deferred psa testing; exam shows 30 gm prostate R>L  wo nodules; continue six months surveillance  psa was drawn for some reason, psa 2.2; no more psa's, letter to patient  06/18/2012 office encounter  - pt insists on continued psa checks in addition to prostate exam due to strong family hx of pca death; exam shows 30 gm prostate l>r wo nodules or induration; psa currently is 2.4, reviewed psa hx with im, recheck in six months;   12/25/2012 office encounter  - pt concerned about psa jump from 2.4 to 5; has sig fam hx pca; appetite good, weight stable, no unusual bone or joint pains; 30 gm prostate l>r wo nodules or induration; will recheck f/t psa in thee months, as long as stable, then fu exam in six months with f/t psa   01/08/2013 medical assistant encounter - pt not willing to wait three months for recheck psa, repeat psa 2.3 with reassuring %free psa 33.5%  01/14/2013 telephone encounter - debbie, call pt, im sure he already knows value but his repeat psa 2.3 and %free psa is normal 33.5%, i recommend no further psa testing but pt insists on psa testing; cancel planned appt in January for psa testing; ask him when he wants his next psa done? Likely arrange nurse appt mid April for f/t psa and exam with me late April for " pvr/ua surveillance fam hx pca bph luts ibe (confirm f/t psa prior) "   06/12/2013 medical assistant encounter - psa normal 2.78 %free psa 32%; pt has an invalid appt for april 8th, this was missed on checkout april 1st and not rescheduled to a valid date and appt time; [name], please make sure this gets changed on Monday apirl 6th so pt doesn't come to office expecting to be seen on Wednesday  07/09/2013 office encounter  - current psa 2.78, pt with fam hx of pca in two brothers, one son with pca, one died of pca; pt has high anxiety about pca; reviewed his psa hx in detail with him, asided from transient spike last october, psa have been very reassuringly normal and with reassuring %free psa; 3xam today shows 35 gm benign prostate wo nodules or induration; pt wants continued six month surveillance with f/t psa and exam;   09/27/2013 medical assistant encounter - psa increased to 4.44, spiked to 5.09 last fall so no particular worry here, pvr 80 cc, ua benign;    09/30/2013 telephone encounter - debbie, not sure why he came in for psa at this time - ask him reason when you call him; yes, his psa did bump up a bit to 4.44 but that is not concerning, had spike last fall to 5.09; he has appts for f/t psa in late september and fu exam mid october; also let him know his urine ok and pvr ok;   12/23/2013 office encounter  - psa has increased to 5.81 with favorable  %free psa 24% - have finally reached his level of intolerance of my recommended ww/active surveillance; pt has fam hx of pca death and is very apprehensive about this rise in psa; has seen [facility] and [facility] in past; discussed options - he understands risks of nbxp including death from sepsis; he would like to see oncologist; will arrange for him to see Dr Given; offered him 12 core biopsy today; discussed targeted biopsy but best to go with 12 core first; happy to  see him in fu and do biopsy pending his discussion with Dr Given; pt understands reluctance to proceed with radiation therapy at age 79 with its risks and complications; certainly would not entertain surgery; will see what Dr Given has to say.  Exam today shows 35 gm benign prostate wo nodules or induration, left lobe maybe slightly larger than right   [id number] [facility] ONC CONSULT GIVEN notes IMPRESSION: 1. Variable PSA, highest of 5.81ng/ml in 11/2013 and most recently 3.3ng/ml in 12/2013. Has been on active surveillance in the past with Dr. Nicanor Alcon but requested consultation with oncology. I advised pt at his age that he should not get too excited about his PSA, in fact the guidelines would not advise further screening. 2. BPH with mild LUTS, specifically weak FOS, followed by Dr. Nicanor Alcon PLAN: 1. FU with Dr. Nicanor Alcon q 6 months as planned   08/05/2014 office encounter  - Preexam F/T PSA pending, pt to call if hasn't heard F/T PSA report by two weeks; sig fam hx of pca; exam shows 40 gm benign prostate wo nodules or induration; will make fu plans pending current psa results  psa 2.91, %free psa 32%; letter to patient and results to pcp, gave pt choice of fu six months or a year.   01/27/2015 office encounter  - hx of elev psa, fam hx pca, pt has high anxiety, current psa favorable 2.69 and %free psa favorable 31%; urine benign; exam shows 35 gm prostate L>R wo nodules or induration; pt wants six month surveillance    07/30/2015 office encounter  - hx of elev psa surveillance and fam hx of pca; pt has high anxiety regarding pca and insists on surveillance; , current psa 2.4 %free psa 30%; urine benign, exam shows 30 gm benign prostate l>r wo nodules or induration; pt wants six month surveillance  02/25/2016 office encounter  - continues with personal requested six month surveillance with exam and psa due to fam hx pca death; current psa stable 3.0 with reassuring %free psa 33%; denies hematuria or dysuria; ua dip and micro negative; exam shows 30 gm benign prostate wo nodules or induration; reassured pt no sign of pca risk other than his fam hx;   [id number] [facility] ED notes Jose Stafford is a 83 y.o. male with PMHX of UTI (6 weeks ago) who presents to the emergency department C/O recurrent burning dysuria onset 3 days ago. Associated sxs include increased frequency. Pt was seen at [facility] for his previous UTI  About 6 weeks ago and was prescribed a drug BID for 10 days, and states that sxs are consistent with UTI. Pt is seen by Jose Heys, MD, Urologist for BPH and urinary urgency. Pt states that he gets HA when he takes Sulfa. Pt states that he has "rampant" FHx of prostate CA. Pt denies fever, testicular pain, rash or swelling in the genitals, abdominal pain, and any other sxs or complaints. sx uti, ua shows pyuria, culture set up Rx keflex  09/06/2016 office encounter  - bph surveillance, auass 10 wo bother, tendency in past for ibe today pvr 160 cc but no bother; urine clear; denies hematuria or dysuria; exam shows 30 gm benign prostate wo nodulues or induration; pre exam f/t psa done due to pt insistence; will call results; as long as ok then fu in six months   062618 psa 2.59 %free psa 23%;   Cs, let pt know his psa remains normal 2.59        IBE /Weak  Stream  POST VOID RESIDUAL  10/22/2007  04/21/2008  10/20/2008    PVR  72ml  58cc  310   11/19/2009 office encounter - auass 4, pvr 45 cc, pt wo bother    06/18/2012 office encounter  - auass 9, stream a little slower; pvr 163 cc (initially 269, had pt double void and pvr 163 cc) will recheck pvr in a month;   12/25/2012 office encounter  - auass 13, not bothered, pvr 184 cc;   07/09/2013 office encounter  - auass 12, pvr 98 cc, defers meds  09/27/2013 medical assistant encounter - pvr 80 cc  12/23/2013 office encounter  - auass 18; pvr 32 cc, some bother  02/25/2016 office encounter  - auass 9, no bother, no meds;   09/06/2016 office encounter  - auass 10, no bother, no meds; pvr 160 cc;       UTI  [id number] [facility] Stafford is a 83 y.o. male with PMHX of UTI (6 weeks ago) who presents to the emergency department C/O recurrent burning dysuria onset 3 days ago. Associated sxs include increased frequency. Pt was seen at [facility] for his previous UTI  About 6 weeks ago and was prescribed a drug BID for 10 days, and states that sxs are consistent with UTI. Pt is seen by Jose HeysJohn D Cervando Durnin, MD, Urologist for BPH and urinary urgency. Pt states that he gets HA when he takes Sulfa. Pt states that he has "rampant" FHx of prostate CA. Pt denies fever, testicular pain, rash or swelling in the genitals, abdominal pain, and any other sxs or complaints. sx uti, ua shows pyuria, culture set up Rx keflex  CULTURE SHOWS E COLI pans sensitive including the keflex he is taking  04/18/2016 telephone encounter - lynn/cs - work together on this - call pt to let him know has appt this thursday at 0900 - should have culture back by then, pt on keflex, reassure him that Dr Nicanor AlconLasater aware of his ER visit, please make sure the urine culture from [id number] is followed up on to make sure pt is on correct abx  Thanks/[id number]   04/21/2016 office encounter  - pt comes in for fu check [facility], interestingly, he had [facility] - suspect inadequate treatment with first uti; sxs resolved  now; urine benign appearing exam benign; discussed an extended course of cipro 250 bid for two weeks along with probiotic to clear out any residual bacteria in the prostate; discussed black box warning for cipro, discussed importance of probiotic; will stop keflex now and start the cipro;   05/30/2016 medical assistant encounter - here for fu ua, wo sxs, urine clear, pvr 105 cc; culture set up  CULTURE no sig growth, cs, let pt know there was not bacteria growth and cultue and his urine looked clear; if doesn't have six month fu appt as pt requested in december, then arrange appt for " f/t psa//pvr/ua surveillance bph fam hx pca death hx uti hx of ibe" in mid [id number]  09/06/2016 office encounter  - denies uti sxs, urine clear today;       Family Hx of Prostate Cancer   11/19/2009 office encounter - reports 3 brothers with pca  12/06/2010 office encounter  - now his 81 yo son has been diagnosed with pca;  12/25/2012 office encounter  -  Sig fam hx of pca;       BPH w  LUTS   Early 2000's microwave treatment - successful   [id number] Burnadette Peter) notes This is a follow-up visit for Jose Stafford who has an established diagnosis of BPH with obstruction. There has been improvement of his symptoms. He reports nocturia one time a night. His current medications includes no medications. He has not experienced Problems when on these medications. Since his last visit he notes less frequency less nocturia less urgency   [id number] Burnadette Peter) notes There has been improvement of his symptoms. He reports nocturia is rare now. He is bothered by some daytime urgency, which has become more noticeable over the past 6 mos. His current medications includes no medications. Since his last visit he notes less frequency less nocturia But more daytime urgency; PLAN Sanctura trial   There has been further improvement of his symptoms. He reports nocturia is rare now, and not more than once nightly. He was bothered by some daytime  urgency, but began Sanctura with good improvement.   [id number] Burnadette Peter) notes His current medications includes no BPH medications. He continues on Reunion, with good results.   [id number] Burnadette Peter) notes There has been improvement of his symptoms. He reports nocturia one or two times a night. He is emptying well, with a PVR of 310 cc today. He is reasonably happy with his voiding despite this. His current medications includes Sanctura 60 mgm qHS. He has not experienced side effects on this medication.   [id number] [name]) notes irritable sxs controlled with sanctura, notes 40 gm benign prostate   10/04/2009 chart entry - chart reviewed, note pt has rx for sudafed and is on sanctura with 40 gm prostate - will discuss impact of this rx on bph sxs and increased pvr;   11/19/2009 office encounter - bph surveillance, reviewed risk of sudafed with bph - uses rarely; remains on sanctura xr on empty stomach, pleased with bladder control; discussed trial of cessation if he would like since doing; denies hematuria or dysuria; urine is dip and micro negative, exam shows 30 gm benign prostate l>r wo Nodules; ok to trial cesation of sanctura if he'd like   05/01/2010 chart entry ??? changed insurance needs alternative for sanctura since not on his insurance approved list, meds listed include oxybutynin, oxybutynin er 5, 10, 15, enablex, gelnique, vesicare; will go with vesicare 10 mg #30 [id number], sent to [facility] pharmacy  05/06/2010 chart entry ??? vesicare too expensive, pt wants generic, pt advised of short term memory loss associated with oxybutynin; Rx oxbutynin er 10 mg daily #30 [id number]  05/27/2010 office encounter  - pt opted to stay w vesicare and avoid side effects with the oxybutynin; his AUA symptom score (detailed in the documents flowsheet) is only 2 on vesicare; pt pleased; will titrate dose to every other and then every third; whatever minimum dosage is effective;   12/06/2010 office encounter  - weaned off vesicare, his AUA symptom score  (detailed in the documents flowsheet) is 7; denies hematuria or dysuria; ua dip and micro negative;   12/08/2011 office encounter  - bph surveillance, no meds; pt satisfied with voiding; his AUA symptom score (detailed in the documents flowsheet) is 4, denies hematuria or dysuria; ua dip and micro negative; occl urgency  06/18/2012 office encounter  - not on any meds right now, stream a little slower; his AUA symptom score (detailed in the documents flowsheet) is 9; denies hematuria or dysuria; ua dip and micro negative; exam shows 30 gm benign prostate wo nodules or induration; will check pvr; pt defers  any meds for now; if beomes bothered will call and we will trial flomax;   12/25/2012 office encounter  - bph surveillance, mild bother, pt defers meds in spite of stop and start nature to stream;   07/09/2013 office encounter  - bph surveillance, has enlarged prostate, tolerates his auass 12 ok; denies hematuria or dysuria;   09/27/2013 medical assistant encounter - psa increased to 4.44, spiked to 5.09 last fall so no particular worry here, pvr 80 cc, ua benign;    12/23/2013 office encounter  - bph /luts still some bother but not more so than his anxiety about pca and rising psa; his AUA symptom score (detailed in GU DATA/REVIEW OF LABS AND IMAGING below) is 18; \\  08/05/2014 office encounter  - bph surveillance, his AUA symptom score (detailed in GU DATA/REVIEW OF LABS AND IMAGING below) is 13, tolerates, denies gross hematuria or dysuria; ua dip and micro negative;  01/27/2015 office encounter  - denies gross hematuria or dysuria; ua dip and micro negative; his AUA symptom score (detailed in GU DATA/REVIEW OF LABS AND IMAGING below) is 18, offered alpha blocker pt declines  07/30/2015 office encounter  - bph surveillance, nlo meds and satisfied with voidning; his AUA symptom score (detailed in GU DATA/REVIEW OF LABS AND IMAGING below) is 11, denies hematuria or dysuria; ua dip and micro negative;   02/25/2016 office encounter  - bph surveillance, his AUA symptom score (detailed in GU DATA/REVIEW OF LABS AND IMAGING below) is 9 denies hematuria or dysuria; urine shows clear;     PAST MEDICAL HISTORY    Past Surgical History:   Procedure Laterality Date   ??? CARDIAC SURG PROCEDURE UNLIST     ??? HX ORTHOPAEDIC     ??? HX OTHER SURGICAL      subdural hematoma   ??? HX SHOULDER ARTHROSCOPY      bilateral        Past Medical History:   Diagnosis Date   ??? Anemia    ??? Atrial fibrillation (HCC)    ??? Benign localized hyperplasia of prostate with urinary obstruction and other lower urinary tract symptoms (LUTS)(600.21)    ??? BPH without obstruction/lower urinary tract symptoms    ??? Elevated PSA    ??? Family hx of prostate cancer    ??? Incomplete bladder emptying    ??? PVC (premature ventricular contraction)    ??? PVC (premature ventricular contraction)    ??? Sleep apnea    ??? Urgency of urination    ??? Urgency of urination      Current Outpatient Prescriptions   Medication Sig Dispense Refill   ??? ferrous gluconate 324 mg (38 mg iron) tablet Take 324 mg by mouth.     ??? rivastigmine tartrate (EXELON) 1.5 mg capsule Take 1.5 mg by mouth.     ??? ascorbic acid, vitamin C, (VITAMIN C) 500 mg tablet Take  by mouth.     ??? cyanocobalamin (VITAMIN B-12) 2,500 mcg sublingual tablet Take 2,500 mcg by mouth daily.     ??? simvastatin (ZOCOR) 20 mg tablet   1   ??? sertraline (ZOLOFT) 50 mg tablet   3   ??? OMEPRAZOLE (PRILOSEC PO) Take  by mouth.     ??? acetaminophen (TYLENOL) 325 mg tablet Take  by mouth every four (4) hours as needed.     ??? montelukast (SINGULAIR) 10 mg tablet Take 10 mg by mouth daily.       ??? Cetirizine (ZYRTEC) 10 mg Cap Take  by mouth.       ??? aspirin delayed-release 81 mg tablet Take  by mouth daily.       ??? GUAIFENESIN (MUCINEX PO) Take  by mouth.         Allergies   Allergen Reactions   ??? Milk Contact Dermatitis   ??? Other Plant, Animal, Environmental Other (comments)      Pollens, trees, dust causes chronic rhinitis and congestion   ??? Sulfa (Sulfonamide Antibiotics) Unknown (comments)   ??? Tolmetin Other (comments)     Family History   Problem Relation Age of Onset   ??? Cancer Other    ??? Hypertension Other        Social  History   Smoking Status   ??? Never Smoker   Smokeless Tobacco   ??? Never Used     History   Alcohol Use No     Comment: very rare        REVIEW OF SYSTEMS  Constitutional: Fever: No  Skin: Rash: No  HEENT: Hearing difficulty: No  Eyes: Blurred vision: No  Cardiovascular: Chest pain: No  Respiratory: Shortness of breath: No  Gastrointestinal: Nausea/vomiting: No  Musculoskeletal: Back pain: No  Neurological: Weakness: No  Psychological: Memory loss: No  Comments/additional findings:      PHYSICAL EXAM  Visit Vitals   ??? BP 118/60   ??? Ht 5\' 7"  (1.702 m)   ??? Wt 150 lb (68 kg)   ??? BMI 23.49 kg/m2     GENERAL APPEARANCE:  83 y.o. Caucasian  male alert, cooperative, no distress, appears stated age not obese;  ABDOMEN:  flat soft, non-tender. Bowel sounds normal. No masses,  no organomegaly  BACK:  symmetric, no curvature. ROM normal. No CVA tenderness.  GENITOURINARY:    Penis:  normal, noncircumcised  Scrotum:  normal  Testes:  normal, no masses, size 20cc  Epididymides:  normal  Anus/Perineum:  within normal limits  Sphincter tone:  within normal limits  Rectum:  negative without mass, lesions or tenderness    Prostate:  30 gm benign prostate wo nodules or induration  Seminal vesicles:  nonpalpable  LYMPHATIC:  Cervical, supraclavicular, and axillary nodes normal.      GU DATA/REVIEW OF LABS AND IMAGING    AUA Assessment Score:  AUA Score: 10;    AUA Bother Rating: Pleased    AUA Symptom Score 09/06/2016   Over the past month how often have you had the sensation that your bladder was not completely empty after you finished urinating? 2   Over the past month, how often have had to urinate again less than 2 hours after you last finished urinating? 1    Over the past month, how often have you found you stopped and started again several times when you urinated? 3   Over the past month, how often have you found it difficult to postpone urination? 0   Over the past month, how often have you had a weak urinary stream? 2   Over the past month, how often have you had to push or strain to begin urinating? 1   Over the past month, how many times did you most typically get up to urinate from the time you went to bed at night until the time you got up in the morning? 1   AUA Score 10   If you were to spend the rest of your life with your urinary condition the way it is now, how would you feel about that?  Pleased       Results for orders placed or performed in visit on 09/06/16   PSA (TOTAL, REFLEX TO FREE)   Result Value Ref Range    Prostate Specific Ag 2.59 0.00 - 4.00 ng/mL   PSA (FREE)   Result Value Ref Range    PSA, Free 0.598 ng/mL    PSA, % Free 23 %   AMB POC URINALYSIS DIP STICK AUTO W/ MICRO (MICRO RESULTS)   Result Value Ref Range    Color (UA POC) Yellow     Clarity (UA POC) Clear     Glucose (UA POC) Negative Negative    Bilirubin (UA POC) Negative Negative    Ketones (UA POC) Negative Negative    Specific gravity (UA POC) 1.015 1.001 - 1.035    Blood (UA POC) Negative Negative    pH (UA POC) 6.0 4.6 - 8.0    Protein (UA POC) Negative Negative    Urobilinogen (UA POC) 0.2 mg/dL 0.2 - 1    Nitrites (UA POC) Negative Negative    Leukocyte esterase (UA POC) Negative Negative    Epithelial cells (UA POC) na     WBCs (UA POC) 0      RBCs (UA POC) 0      Bacteria (UA POC) None Negative    Crystals (UA POC) Negative Negative    Other (UA POC)     AMB POC PVR, MEAS,POST-VOID RES,US,NON-IMAGING   Result Value Ref Range    PVR 160 cc     Copy to [name], MD and other appropriate care team members eFaxed today.     Jose Heys, MD  09/06/2016

## 2016-09-06 NOTE — Progress Notes (Signed)
I have sent message to pt thru my chart of his test results.

## 2016-09-07 LAB — PSA (TOTAL, REFLEX TO FREE): Prostate Specific Ag: 2.59 ng/mL (ref 0.00–4.00)

## 2016-09-07 LAB — PSA (FREE)
PSA, % Free: 23 %
PSA, Free: 0.598 ng/mL

## 2017-03-15 ENCOUNTER — Encounter: Attending: Urology | Primary: Family Medicine

## 2017-03-16 ENCOUNTER — Ambulatory Visit: Admit: 2017-03-16 | Discharge: 2017-03-16 | Attending: Urology | Primary: Family Medicine

## 2017-03-16 DIAGNOSIS — N401 Enlarged prostate with lower urinary tract symptoms: Secondary | ICD-10-CM

## 2017-03-16 LAB — AMB POC URINALYSIS DIP STICK AUTO W/ MICRO (MICRO RESULTS)
Bilirubin (UA POC): NEGATIVE
Blood (UA POC): NEGATIVE
Crystals (UA POC): NEGATIVE
Glucose (UA POC): NEGATIVE
Ketones (UA POC): NEGATIVE
Leukocyte esterase (UA POC): NEGATIVE
Nitrites (UA POC): NEGATIVE
Protein (UA POC): NEGATIVE
RBCs (UA POC): 0
Specific gravity (UA POC): 1.01 (ref 1.001–1.035)
Urobilinogen (UA POC): 0.2 (ref 0.2–1)
WBCs (UA POC): 0
pH (UA POC): 6 (ref 4.6–8.0)

## 2017-03-16 LAB — AMB POC PVR, MEAS,POST-VOID RES,US,NON-IMAGING: PVR: 99 cc

## 2017-03-16 NOTE — Progress Notes (Addendum)
Jose Heys, MD  [facility], [facility]  [address]  [place], [place] [address]  [phone]:  Jose Stafford  DOB:  1927-04-09  Date of Service:  03/16/2017    ASSESSMENT/PLAN    Encounter Diagnoses   Name Primary?   ??? Benign prostatic hyperplasia with lower urinary tract symptoms, symptom details unspecified Yes   ??? History of elevated PSA    ??? Incomplete emptying of bladder    ??? Urgency of urination    ??? Slowing of urinary stream    ??? Family history of malignant neoplasm of prostate        Assessment:  Pt with apprehension about pca due to fam hx, no sign of such risk today; bph luts tolerated; psa pending today  Plan:  Pt wants six month surveillance, fu with f/t psa prior      HISTORY OF PRESENT ILLNESS  He is currently 84 y.o. and his primary care provider is [name], MD.    Chief Complaint   Patient presents with   ??? Prostate Cancer       BPH w LUTS/Hx of Elevated PSA/Family Hx of PCA death   2007-05-11 psa 1.9; 081009 psa 2.3; 020810 psa 1.6; 080910 psa 2.2; 244010 psa 3.1; 272536 psa 2.9; 644034 psa 2.23; 742595 psa 2.5; 638756 psa 2.2; 433295 psa 2.2;  188416 psa 2.4; 100714 psa 5.09,%free psa 19%; 102814 psa 2.3,%free psa 33.5%; 606301 psa 2.78 %free psa 32%; 071715 psa 4.44; 093015 psa 5.81 %free psa 24%; 052416 psa 2.91 %free psa 32%; 601093 psa 2.4 %free psa 30%; 110117 psa 3.0, %free psa 33%; 235573 psa 2.59 %free psa 23%; 220254 psa 2.11;          10/04/2009 chart entry - pt has fu appt with me in September having been referred by Dr Burnadette Peter per his retirement; 84 yo with normal psa but abnormal psa velocity; has bph;   11/19/2009 office encounter - Preexam psa pending, pt to call if hasn't heard psa report by two weeks; exam shows 30 gm prostate, l>r wo nodules; reassured pt no sign of cancer; copy of psa to pcp planned;   Psa 2.9, reassuring letter to patient, copy of report to Dr Donette Larry    05/20/2010 medical assistant encounter - psa draw 2.23;  appt near future  05/27/2010 office encounter  - elev psa surveillance in pt with family hx of sig pca; psa range is 1.9 - 3.1, present 2.23; pt anxious about his psa and family hx wants six month checks; urine is micro benign; exam shows 30 gm prostate r>l wo nodules or induration; reassured pt no sign of prostate cancer but at risk due to age and family hx; reassured him pca death unlikely for him;   12/18/2010 medical assistant encounter - psa draw, reassuring stable value of 2.5; appt near future  12/06/2010 office encounter  - elevated psa hx in pt with strong fam hx of pca - brothers and his son and 3 nephews all with pca; reviewed his psa hx which is reassuring with stable value of 2.5; pt wo sxs; feels great - even though 42 he wishes active surveillance; exam shows 30 gm prostate r>l wo nodules or induration; will continue six month surveillance  06/06/2011 medical assistant encounter - psa 2.2, cw, let pt know his psa improved normal at 2.2; arrange appt with me in six months for " ua surveillance bph luts fam hx pca " - i recommend no  psa testing; if he insists on psa testing, then get psa level two week prior;   12/08/2011 office encounter  - extensive fam hx of pca; here for fu exam, psa's have been normal recently so pt deferred psa testing; exam shows 30 gm prostate R>L  wo nodules; continue six months surveillance  psa was drawn for some reason, psa 2.2; no more psa's, letter to patient  06/18/2012 office encounter  - pt insists on continued psa checks in addition to prostate exam due to strong family hx of pca death; exam shows 30 gm prostate l>r wo nodules or induration; psa currently is 2.4, reviewed psa hx with im, recheck in six months;   12/25/2012 office encounter  - pt concerned about psa jump from 2.4 to 5; has sig fam hx pca; appetite good, weight stable, no unusual bone or joint  pains; 30 gm prostate l>r wo nodules or induration; will recheck f/t psa in thee months, as long as stable, then fu exam in six months with f/t psa  01/08/2013 medical assistant encounter - pt not willing to wait three months for recheck psa, repeat psa 2.3 with reassuring %free psa 33.5%  01/14/2013 telephone encounter - debbie, call pt, im sure he already knows value but his repeat psa 2.3 and %free psa is normal 33.5%, i recommend no further psa testing but pt insists on psa testing; cancel planned appt in January for psa testing; ask him when he wants his next psa done? Likely arrange nurse appt mid April for f/t psa and exam with me late April for " pvr/ua surveillance fam hx pca bph luts ibe (confirm f/t psa prior) "   06/12/2013 medical assistant encounter - psa normal 2.78 %free psa 32%; pt has an invalid appt for april 8th, this was missed on checkout april 1st and not rescheduled to a valid date and appt time; [name], please make sure this gets changed on Monday apirl 6th so pt doesn't come to office expecting to be seen on Wednesday  07/09/2013 office encounter  - current psa 2.78, pt with fam hx of pca in two brothers, one son with pca, one died of pca; pt has high anxiety about pca; reviewed his psa hx in detail with him, asided from transient spike last october, psa have been very reassuringly normal and with reassuring %free psa; 3xam today shows 35 gm benign prostate wo nodules or induration; pt wants continued six month surveillance with f/t psa and exam;   09/27/2013 medical assistant encounter - psa increased to 4.44, spiked to 5.09 last fall so no particular worry here, pvr 80 cc, ua benign;    09/30/2013 telephone encounter - debbie, not sure why he came in for psa at this time - ask him reason when you call him; yes, his psa did bump up a bit to 4.44 but that is not concerning, had spike last fall to 5.09; he has appts for f/t psa in late september and fu exam mid october; also let  him know his urine ok and pvr ok;   12/23/2013 office encounter  - psa has increased to 5.81 with favorable %free psa 24% - have finally reached his level of intolerance of my recommended ww/active surveillance; pt has fam hx of pca death and is very apprehensive about this rise in psa; has seen [facility] and [facility] in past; discussed options - he understands risks of nbxp including death from sepsis; he would like to see oncologist; will arrange for him to  see Dr Given; offered him 12 core biopsy today; discussed targeted biopsy but best to go with 12 core first; happy to see him in fu and do biopsy pending his discussion with Dr Given; pt understands reluctance to proceed with radiation therapy at age 44 with its risks and complications; certainly would not entertain surgery; will see what Dr Given has to say.  Exam today shows 35 gm benign prostate wo nodules or induration, left lobe maybe slightly larger than right   [id number] GU ONC CONSULT GIVEN notes IMPRESSION: 1. Variable PSA, highest of 5.81ng/ml in 11/2013 and most recently 3.3ng/ml in 12/2013. Has been on active surveillance in the past with Dr. Nicanor Alcon but requested consultation with oncology. I advised pt at his age that he should not get too excited about his PSA, in fact the guidelines would not advise further screening. 2. BPH with mild LUTS, specifically weak FOS, followed by Dr. Nicanor Alcon PLAN: 1. FU with Dr. Nicanor Alcon q 6 months as planned   08/05/2014 office encounter  - Preexam F/T PSA pending, pt to call if hasn't heard F/T PSA report by two weeks; sig fam hx of pca; exam shows 40 gm benign prostate wo nodules or induration; will make fu plans pending current psa results  psa 2.91, %free psa 32%; letter to patient and results to pcp, gave pt choice of fu six months or a year.   01/27/2015 office encounter  - hx of elev psa, fam hx pca, pt has high anxiety, current psa favorable 2.69 and %free psa favorable 31%; urine  benign; exam shows 35 gm prostate L>R wo nodules or induration; pt wants six month surveillance   07/30/2015 office encounter  - hx of elev psa surveillance and fam hx of pca; pt has high anxiety regarding pca and insists on surveillance; , current psa 2.4 %free psa 30%; urine benign, exam shows 30 gm benign prostate l>r wo nodules or induration; pt wants six month surveillance  02/25/2016 office encounter  - continues with personal requested six month surveillance with exam and psa due to fam hx pca death; current psa stable 3.0 with reassuring %free psa 33%; denies hematuria or dysuria; ua dip and micro negative; exam shows 30 gm benign prostate wo nodules or induration; reassured pt no sign of pca risk other than his fam hx;   [id number] [facility] ED notes Jose Stafford is a 84 y.o. male with PMHX of UTI (6 weeks ago) who presents to the emergency department C/O recurrent burning dysuria onset 3 days ago. Associated sxs include increased frequency. Pt was seen at [facility] for his previous UTI  About 6 weeks ago and was prescribed a drug BID for 10 days, and states that sxs are consistent with UTI. Pt is seen by Jose Heys, MD, Urologist for BPH and urinary urgency. Pt states that he gets HA when he takes Sulfa. Pt states that he has "rampant" FHx of prostate CA. Pt denies fever, testicular pain, rash or swelling in the genitals, abdominal pain, and any other sxs or complaints. sx uti, ua shows pyuria, culture set up Rx keflex  09/06/2016 office encounter  - bph surveillance, auass 10 wo bother, tendency in past for ibe today pvr 160 cc but no bother; urine clear; denies hematuria or dysuria; exam shows 30 gm benign prostate wo nodulues or induration; pre exam f/t psa done due to pt insistence; will call results; as long as ok then fu in six months   [id number] psa 2.59 %free  psa 23%;   Cs, let pt know his psa remains normal 2.59  03/16/2017 office encounter  - bph surveillance, tolerates auass 15, pvr 99  cc denies hematuria or dysuria; f/t psa pending; fam hx of pca and pt apprehensive about pca; urine benign, exam shows 30 gm benign prostate wo nodules or induration, will call psa results to him  psa 2.11, ss, let pt know psa ok keep appt as planned; [id number]        IBE /Weak Stream  POST VOID RESIDUAL  10/22/2007  04/21/2008  10/20/2008    PVR  72ml  58cc  310   11/19/2009 office encounter - auass 4, pvr 45 cc, pt wo bother   06/18/2012 office encounter  - auass 9, stream a little slower; pvr 163 cc (initially 269, had pt double void and pvr 163 cc) will recheck pvr in a month;   12/25/2012 office encounter  - auass 13, not bothered, pvr 184 cc;   07/09/2013 office encounter  - auass 12, pvr 98 cc, defers meds  09/27/2013 medical assistant encounter - pvr 80 cc  12/23/2013 office encounter  - auass 18; pvr 32 cc, some bother  02/25/2016 office encounter  - auass 9, no bother, no meds;   09/06/2016 office encounter  - auass 10, no bother, no meds; pvr 160 cc;   03/16/2017 office encounter  - auass 15, pvr 99 cc; declines meds      UTI  [id number] [facility] ED notes Jose Stafford is a 84 y.o. male with PMHX of UTI (6 weeks ago) who presents to the emergency department C/O recurrent burning dysuria onset 3 days ago. Associated sxs include increased frequency. Pt was seen at [facility] for his previous UTI  About 6 weeks ago and was prescribed a drug BID for 10 days, and states that sxs are consistent with UTI. Pt is seen by Jose Heys, MD, Urologist for BPH and urinary urgency. Pt states that he gets HA when he takes Sulfa. Pt states that he has "rampant" FHx of prostate CA. Pt denies fever, testicular pain, rash or swelling in the genitals, abdominal pain, and any other sxs or complaints. sx uti, ua shows pyuria, culture set up Rx keflex  CULTURE SHOWS E COLI pans sensitive including the keflex he is taking  04/18/2016 telephone encounter - lynn/cs - work together on this - call pt  to let him know has appt this thursday at 0900 - should have culture back by then, pt on keflex, reassure him that Dr Nicanor Alcon aware of his ER visit, please make sure the urine culture from [id number] is followed up on to make sure pt is on correct abx  Thanks/[id number]   04/21/2016 office encounter  - pt comes in for fu check after recent uti treated at [facility] ed, interestingly, he had been [facility] early [facility] - suspect inadequate treatment with first uti; sxs resolved now; urine benign appearing exam benign; discussed an extended course of cipro 250 bid for two weeks along with probiotic to clear out any residual bacteria in the prostate; discussed black box warning for cipro, discussed importance of probiotic; will stop keflex now and start the cipro;   05/30/2016 medical assistant encounter - here for fu ua, wo sxs, urine clear, pvr 105 cc; culture set up  CULTURE no sig growth, cs, let pt know there was not bacteria growth and cultue and his urine looked clear; if doesn't have six month fu  appt as pt requested in december, then arrange appt for " f/t psa//pvr/ua surveillance bph fam hx pca death hx uti hx of ibe" in mid june/[id number]  03/16/2017 office encounter  - denies uti sxs, urine clear today;       Family Hx of Prostate Cancer   11/19/2009 office encounter - reports 3 brothers with pca  12/06/2010 office encounter  - now his 67 yo son has been diagnosed with pca;  12/25/2012 office encounter  -  Sig fam hx of pca;       BPH w LUTS   Early 2000's microwave treatment - successful   [id number] Burnadette Peter) notes This is a follow-up visit for Jose Stafford who has an established diagnosis of BPH with obstruction. There has been improvement of his symptoms. He reports nocturia one time a night. His current medications includes no medications. He has not experienced Problems when on these medications. Since his last visit he notes less frequency less nocturia less urgency    [id number] Burnadette Peter) notes There has been improvement of his symptoms. He reports nocturia is rare now. He is bothered by some daytime urgency, which has become more noticeable over the past 6 mos. His current medications includes no medications. Since his last visit he notes less frequency less nocturia But more daytime urgency; PLAN Sanctura trial   There has been further improvement of his symptoms. He reports nocturia is rare now, and not more than once nightly. He was bothered by some daytime urgency, but began Sanctura with good improvement.   [id number] Burnadette Peter) notes His current medications includes no BPH medications. He continues on Reunion, with good results.   [id number] Burnadette Peter) notes There has been improvement of his symptoms. He reports nocturia one or two times a night. He is emptying well, with a PVR of 310 cc today. He is reasonably happy with his voiding despite this. His current medications includes Sanctura 60 mgm qHS. He has not experienced side effects on this medication.   [id number] [name]) notes irritable sxs controlled with sanctura, notes 40 gm benign prostate   10/04/2009 chart entry - chart reviewed, note pt has rx for sudafed and is on sanctura with 40 gm prostate - will discuss impact of this rx on bph sxs and increased pvr;   11/19/2009 office encounter - bph surveillance, reviewed risk of sudafed with bph - uses rarely; remains on sanctura xr on empty stomach, pleased with bladder control; discussed trial of cessation if he would like since doing; denies hematuria or dysuria; urine is dip and micro negative, exam shows 30 gm benign prostate l>r wo Nodules; ok to trial cesation of sanctura if he'd like   05/01/2010 chart entry ??? changed insurance needs alternative for sanctura since not on his insurance approved list, meds listed include oxybutynin, oxybutynin er 5, 10, 15, enablex, gelnique, vesicare; will go with vesicare 10 mg #30 rfx11, sent to [facility] pharmacy   05/06/2010 chart entry ??? vesicare too expensive, pt wants generic, pt advised of short term memory loss associated with oxybutynin; Rx oxbutynin er 10 mg daily #30 [id number]  05/27/2010 office encounter  - pt opted to stay w vesicare and avoid side effects with the oxybutynin; his AUA symptom score (detailed in the documents flowsheet) is only 2 on vesicare; pt pleased; will titrate dose to every other and then every third; whatever minimum dosage is effective;   12/06/2010 office encounter  - weaned off vesicare, his AUA symptom score (detailed in the documents flowsheet) is 7;  denies hematuria or dysuria; ua dip and micro negative;   12/08/2011 office encounter  - bph surveillance, no meds; pt satisfied with voiding; his AUA symptom score (detailed in the documents flowsheet) is 4, denies hematuria or dysuria; ua dip and micro negative; occl urgency  06/18/2012 office encounter  - not on any meds right now, stream a little slower; his AUA symptom score (detailed in the documents flowsheet) is 9; denies hematuria or dysuria; ua dip and micro negative; exam shows 30 gm benign prostate wo nodules or induration; will check pvr; pt defers any meds for now; if beomes bothered will call and we will trial flomax;   12/25/2012 office encounter  - bph surveillance, mild bother, pt defers meds in spite of stop and start nature to stream;   07/09/2013 office encounter  - bph surveillance, has enlarged prostate, tolerates his auass 12 ok; denies hematuria or dysuria;   09/27/2013 medical assistant encounter - psa increased to 4.44, spiked to 5.09 last fall so no particular worry here, pvr 80 cc, ua benign;    12/23/2013 office encounter  - bph /luts still some bother but not more so than his anxiety about pca and rising psa; his AUA symptom score (detailed in GU DATA/REVIEW OF LABS AND IMAGING below) is 18; \\  08/05/2014 office encounter  - bph surveillance, his AUA symptom score  (detailed in GU DATA/REVIEW OF LABS AND IMAGING below) is 13, tolerates, denies gross hematuria or dysuria; ua dip and micro negative;  01/27/2015 office encounter  - denies gross hematuria or dysuria; ua dip and micro negative; his AUA symptom score (detailed in GU DATA/REVIEW OF LABS AND IMAGING below) is 18, offered alpha blocker pt declines  07/30/2015 office encounter  - bph surveillance, nlo meds and satisfied with voidning; his AUA symptom score (detailed in GU DATA/REVIEW OF LABS AND IMAGING below) is 11, denies hematuria or dysuria; ua dip and micro negative;  02/25/2016 office encounter  - bph surveillance, his AUA symptom score (detailed in GU DATA/REVIEW OF LABS AND IMAGING below) is 9 denies hematuria or dysuria; urine shows clear;     PAST MEDICAL HISTORY    Past Surgical History:   Procedure Laterality Date   ??? CARDIAC SURG PROCEDURE UNLIST     ??? HX ORTHOPAEDIC     ??? HX OTHER SURGICAL      subdural hematoma   ??? HX SHOULDER ARTHROSCOPY      bilateral        Past Medical History:   Diagnosis Date   ??? Anemia    ??? Atrial fibrillation (HCC)    ??? Benign localized hyperplasia of prostate with urinary obstruction and other lower urinary tract symptoms (LUTS)(600.21)    ??? BPH without obstruction/lower urinary tract symptoms    ??? Elevated PSA    ??? Family hx of prostate cancer    ??? Incomplete bladder emptying    ??? PVC (premature ventricular contraction)    ??? PVC (premature ventricular contraction)    ??? Sleep apnea    ??? Urgency of urination    ??? Urgency of urination      Current Outpatient Medications   Medication Sig Dispense Refill   ??? triamcinolone acetonide (KENALOG) 0.1 % ointment by IntraTYMPanic route.     ??? ferrous gluconate 324 mg (38 mg iron) tablet Take 324 mg by mouth.     ??? rivastigmine tartrate (EXELON) 1.5 mg capsule Take 1.5 mg by mouth.     ??? ascorbic acid, vitamin  C, (VITAMIN C) 500 mg tablet Take  by mouth.     ??? cyanocobalamin (VITAMIN B-12) 2,500 mcg sublingual tablet Take 2,500 mcg  by mouth daily.     ??? simvastatin (ZOCOR) 20 mg tablet   1   ??? sertraline (ZOLOFT) 50 mg tablet   3   ??? OMEPRAZOLE (PRILOSEC PO) Take  by mouth.     ??? acetaminophen (TYLENOL) 325 mg tablet Take  by mouth every four (4) hours as needed.     ??? montelukast (SINGULAIR) 10 mg tablet Take 10 mg by mouth daily.       ??? Cetirizine (ZYRTEC) 10 mg Cap Take  by mouth.       ??? aspirin delayed-release 81 mg tablet Take  by mouth daily.       ??? GUAIFENESIN (MUCINEX PO) Take  by mouth.         Allergies   Allergen Reactions   ??? Milk Contact Dermatitis     Other reaction(s): Contact Dermatitis   ??? Other Plant, Animal, Environmental Other (comments)     Pollens, trees, dust causes chronic rhinitis and congestion   ??? Sulfa (Sulfonamide Antibiotics) Unknown (comments)   ??? Tolmetin Other (comments)     Family History   Problem Relation Age of Onset   ??? Cancer Other    ??? Hypertension Other        Social  Social History     Tobacco Use   Smoking Status Never Smoker   Smokeless Tobacco Never Used     Social History     Substance and Sexual Activity   Alcohol Use No   ??? Alcohol/week: 0.5 oz   ??? Types: 1 Glasses of wine per week    Comment: very rare        REVIEW OF SYSTEMS  Constitutional: Fever: No  Skin: Rash: No  HEENT: Hearing difficulty: No  Eyes: Blurred vision: No  Cardiovascular: Chest pain: No  Respiratory: Shortness of breath: No  Gastrointestinal: Nausea/vomiting: No  Musculoskeletal: Back pain: No  Neurological: Weakness: No  Psychological: Memory loss:   Comments/additional findings:      PHYSICAL EXAM  Visit Vitals  BP 110/60   Ht 5\' 7"  (1.702 m)   Wt 153 lb 12.8 oz (69.8 kg)   BMI 24.09 kg/m??      GENERAL APPEARANCE:  84 y.o. Caucasian  male alert, cooperative, no distress, appears stated age not obese;  ABDOMEN:  flat soft, non-tender. Bowel sounds normal. No masses,  no organomegaly  BACK:  symmetric, no curvature. ROM normal. No CVA tenderness.  GENITOURINARY:    Penis:  normal, noncircumcised  Scrotum:  normal   Testes:  normal, no masses, size 20cc  Epididymides:  normal  Anus/Perineum:  within normal limits  Sphincter tone:  within normal limits  Rectum:  negative without mass, lesions or tenderness    Prostate:  30 gm benign prostate wo nodules or induration  Seminal vesicles:  nonpalpable  LYMPHATIC:  Cervical, supraclavicular, and axillary nodes normal.      GU DATA/REVIEW OF LABS AND IMAGING    AUA Assessment Score:  AUA Score: 15;    AUA Bother Rating: Pleased    AUA Symptom Score 03/16/2017   Over the past month how often have you had the sensation that your bladder was not completely empty after you finished urinating? 1   Over the past month, how often have had to urinate again less than 2 hours after you last finished urinating?  1   Over the past month, how often have you found you stopped and started again several times when you urinated? 3   Over the past month, how often have you found it difficult to postpone urination? 5   Over the past month, how often have you had a weak urinary stream? 3   Over the past month, how often have you had to push or strain to begin urinating? 1   Over the past month, how many times did you most typically get up to urinate from the time you went to bed at night until the time you got up in the morning? 1   AUA Score 15   If you were to spend the rest of your life with your urinary condition the way it is now, how would you feel about that? Pleased       Results for orders placed or performed in visit on 03/16/17   PSA (TOTAL, REFLEX TO FREE)   Result Value Ref Range    Prostate Specific Ag 2.11 0.00 - 4.00 ng/mL   AMB POC URINALYSIS DIP STICK AUTO W/ MICRO (MICRO RESULTS)   Result Value Ref Range    Color (UA POC) Yellow     Clarity (UA POC) Clear     Glucose (UA POC) Negative Negative    Bilirubin (UA POC) Negative Negative    Ketones (UA POC) Negative Negative    Specific gravity (UA POC) 1.010 1.001 - 1.035    Blood (UA POC) Negative Negative    pH (UA POC) 6.0 4.6 - 8.0     Protein (UA POC) Negative Negative    Urobilinogen (UA POC) 0.2 mg/dL 0.2 - 1    Nitrites (UA POC) Negative Negative    Leukocyte esterase (UA POC) Negative Negative    Epithelial cells (UA POC) na     WBCs (UA POC) 0      RBCs (UA POC) 0      Bacteria (UA POC) None Negative    Crystals (UA POC) Negative Negative    Other (UA POC)     AMB POC PVR, MEAS,POST-VOID RES,US,NON-IMAGING   Result Value Ref Range    PVR 99 cc       Copy to Maryann ConnersValerio, Hernani A, MD and other appropriate care team members eFaxed today.     Jose HeysJohn D Tani Virgo, MD  03/16/2017

## 2017-03-17 LAB — PSA (TOTAL, REFLEX TO FREE): Prostate Specific Ag: 2.11 ng/mL (ref 0.00–4.00)

## 2017-03-20 NOTE — Telephone Encounter (Signed)
A user error has taken place: encounter opened in error, closed for administrative reasons.

## 2017-09-13 ENCOUNTER — Ambulatory Visit: Admit: 2017-09-13 | Discharge: 2017-09-13 | Attending: Urology | Primary: Family Medicine

## 2017-09-13 ENCOUNTER — Ambulatory Visit: Attending: Urology | Primary: Family Medicine

## 2017-09-13 DIAGNOSIS — N401 Enlarged prostate with lower urinary tract symptoms: Secondary | ICD-10-CM

## 2017-09-13 LAB — AMB POC URINALYSIS DIP STICK AUTO W/ MICRO (MICRO RESULTS)
Bilirubin (UA POC): NEGATIVE
Bilirubin, Urine, POC: NEGATIVE
Blood (UA POC): NEGATIVE
Blood (UA POC): NEGATIVE
Epi Cells Urine, POC: 0 NA
Epithelial cells (UA POC): 0
Glucose (UA POC): NEGATIVE
Glucose, Urine, POC: NEGATIVE
Ketones (UA POC): NEGATIVE
Ketones, Urine, POC: NEGATIVE
Leukocyte Esterase, Urine, POC: NEGATIVE
Leukocyte esterase (UA POC): NEGATIVE
Nitrite, Urine, POC: NEGATIVE
Nitrites (UA POC): NEGATIVE
Protein (UA POC): NEGATIVE
Protein, Urine, POC: NEGATIVE
RBC, Urine, POC: 0
RBCs (UA POC): 0
Specific Gravity, Urine, POC: 1.015 NA (ref 1.001–1.035)
Specific gravity (UA POC): 1.015 (ref 1.001–1.035)
Urobilinogen (UA POC): 0.2 (ref 0.2–1)
Urobilinogen, POC: 0.2 (ref 0.2–1)
WBC, Urine, POC: 0
WBCs (UA POC): 0
pH (UA POC): 5.5 (ref 4.6–8.0)
pH, Urine, POC: 5.5 NA (ref 4.6–8.0)

## 2017-09-13 LAB — PSA (TOTAL, REFLEX TO FREE)
PSA: 2.2 ng/mL (ref 0.00–4.00)
Prostate Specific Ag: 2.2 ng/mL (ref 0.00–4.00)

## 2017-09-13 LAB — AMB POC PVR, MEAS,POST-VOID RES,US,NON-IMAGING
PVR POC: 80 cc
PVR: 80 cc

## 2017-09-13 NOTE — Progress Notes (Addendum)
[name], MD  [facility], [facility]  [address]  [place], [place] [address]  [phone]    Patient:  Hanan Moen  DOB:  10-29-1927  Date of Service:  09/13/2017    ASSESSMENT/PLAN    Encounter Diagnoses   Name Primary?   ??? Benign prostatic hyperplasia with lower urinary tract symptoms, symptom details unspecified Yes   ??? Family history of malignant neoplasm of prostate    ??? Slowing of urinary stream    ??? Urgency of urination    ??? Incomplete emptying of bladder        Assessment:  pth with fam hx of pca has pca concerns and insists on six month surveillance; psa remains stable and exam remains benign unchanged; voiding sxs are significatn but pt wo bother  Plan:  Pt aware of meds to help voiding; declines; psa pending today; continue six month surveillance       HISTORY OF PRESENT ILLNESS  He is currently 84 y.o. and his primary care provider is [name], MD.    Chief Complaint   Patient presents with   ??? Elevated PSA       BPH w LUTS/Hx of Elevated PSA/Family Hx of PCA death   May 04, 2007 psa 1.9; 081009 psa 2.3; 020810 psa 1.6; 914782 psa 2.2; 956213 psa 3.1; 086578 psa 2.9; 469629 psa 2.23; 528413 psa 2.5; 244010 psa 2.2; 272536 psa 2.2;  644034 psa 2.4; 100714 psa 5.09,%free psa 19%; 102814 psa 2.3,%free psa 33.5%; 742595 psa 2.78 %free psa 32%; 071715 psa 4.44; 093015 psa 5.81 %free psa 24%; 052416 psa 2.91 %free psa 32%; 638756 psa 2.4 %free psa 30%; 110117 psa 3.0, %free psa 33%; 433295 psa 2.59 %free psa 23%; 188416 psa 2.11; 606301 psa 2.2;           10/04/2009 chart entry - pt has fu appt with me in September having been referred by Dr Burnadette Peter per his retirement; 84 yo with normal psa but abnormal psa velocity; has bph;   11/19/2009 office encounter - Preexam psa pending, pt to call if hasn't heard psa report by two weeks; exam shows 30 gm prostate, l>r wo nodules; reassured pt no sign of cancer; copy of psa to pcp planned;    Psa 2.9, reassuring letter to patient, copy of report to Dr Donette Larry   05/20/2010 medical assistant encounter - psa draw 2.23;  appt near future  05/27/2010 office encounter  - elev psa surveillance in pt with family hx of sig pca; psa range is 1.9 - 3.1, present 2.23; pt anxious about his psa and family hx wants six month checks; urine is micro benign; exam shows 30 gm prostate r>l wo nodules or induration; reassured pt no sign of prostate cancer but at risk due to age and family hx; reassured him pca death unlikely for him;   12-11-2010 medical assistant encounter - psa draw, reassuring stable value of 2.5; appt near future  12/06/2010 office encounter  - elevated psa hx in pt with strong fam hx of pca - brothers and his son and 3 nephews all with pca; reviewed his psa hx which is reassuring with stable value of 2.5; pt wo sxs; feels great - even though 41 he wishes active surveillance; exam shows 30 gm prostate r>l wo nodules or induration; will continue six month surveillance  06/06/2011 medical assistant encounter - psa 2.2, cw, let pt know his psa improved normal at 2.2; arrange appt with me in  six months for " ua surveillance bph luts fam hx pca " - i recommend no psa testing; if he insists on psa testing, then get psa level two week prior;   12/08/2011 office encounter  - extensive fam hx of pca; here for fu exam, psa's have been normal recently so pt deferred psa testing; exam shows 30 gm prostate R>L  wo nodules; continue six months surveillance  psa was drawn for some reason, psa 2.2; no more psa's, letter to patient  06/18/2012 office encounter  - pt insists on continued psa checks in addition to prostate exam due to strong family hx of pca death; exam shows 30 gm prostate l>r wo nodules or induration; psa currently is 2.4, reviewed psa hx with im, recheck in six months;   12/25/2012 office encounter  - pt concerned about psa jump from 2.4 to 5;  has sig fam hx pca; appetite good, weight stable, no unusual bone or joint pains; 30 gm prostate l>r wo nodules or induration; will recheck f/t psa in thee months, as long as stable, then fu exam in six months with f/t psa  01/08/2013 medical assistant encounter - pt not willing to wait three months for recheck psa, repeat psa 2.3 with reassuring %free psa 33.5%  01/14/2013 telephone encounter - debbie, call pt, im sure he already knows value but his repeat psa 2.3 and %free psa is normal 33.5%, i recommend no further psa testing but pt insists on psa testing; cancel planned appt in January for psa testing; ask him when he wants his next psa done? Likely arrange nurse appt mid April for f/t psa and exam with me late April for " pvr/ua surveillance fam hx pca bph luts ibe (confirm f/t psa prior) "   06/12/2013 medical assistant encounter - psa normal 2.78 %free psa 32%; pt has an invalid appt for april 8th, this was missed on checkout april 1st and not rescheduled to a valid date and appt time; [name], please make sure this gets changed on Monday apirl 6th so pt doesn't come to office expecting to be seen on Wednesday  07/09/2013 office encounter  - current psa 2.78, pt with fam hx of pca in two brothers, one son with pca, one died of pca; pt has high anxiety about pca; reviewed his psa hx in detail with him, asided from transient spike last october, psa have been very reassuringly normal and with reassuring %free psa; 3xam today shows 35 gm benign prostate wo nodules or induration; pt wants continued six month surveillance with f/t psa and exam;   09/27/2013 medical assistant encounter - psa increased to 4.44, spiked to 5.09 last fall so no particular worry here, pvr 80 cc, ua benign;    09/30/2013 telephone encounter - debbie, not sure why he came in for psa at this time - ask him reason when you call him; yes, his psa did bump up a bit to 4.44 but that is not concerning, had spike last fall to 5.09; he  has appts for f/t psa in late september and fu exam mid october; also let him know his urine ok and pvr ok;   12/23/2013 office encounter  - psa has increased to 5.81 with favorable %free psa 24% - have finally reached his level of intolerance of my recommended ww/active surveillance; pt has fam hx of pca death and is very apprehensive about this rise in psa; has seen shellhammer and lynch in past; discussed options - he understands risks of  nbxp including death from sepsis; he would like to see oncologist; will arrange for him to see Dr Given; offered him 12 core biopsy today; discussed targeted biopsy but best to go with 12 core first; happy to see him in fu and do biopsy pending his discussion with Dr Given; pt understands reluctance to proceed with radiation therapy at age 72 with its risks and complications; certainly would not entertain surgery; will see what Dr Given has to say.  Exam today shows 35 gm benign prostate wo nodules or induration, left lobe maybe slightly larger than right   [id number] GU ONC CONSULT GIVEN notes IMPRESSION: 1. Variable PSA, highest of 5.81ng/ml in 11/2013 and most recently 3.3ng/ml in 12/2013. Has been on active surveillance in the past with Dr. Nicanor Alcon but requested consultation with oncology. I advised pt at his age that he should not get too excited about his PSA, in fact the guidelines would not advise further screening. 2. BPH with mild LUTS, specifically weak FOS, followed by Dr. Nicanor Alcon PLAN: 1. FU with Dr. Nicanor Alcon q 6 months as planned   08/05/2014 office encounter  - Preexam F/T PSA pending, pt to call if hasn't heard F/T PSA report by two weeks; sig fam hx of pca; exam shows 40 gm benign prostate wo nodules or induration; will make fu plans pending current psa results  psa 2.91, %free psa 32%; letter to patient and results to pcp, gave pt choice of fu six months or a year.   01/27/2015 office encounter  - hx of elev psa, fam hx pca, pt has high  anxiety, current psa favorable 2.69 and %free psa favorable 31%; urine benign; exam shows 35 gm prostate L>R wo nodules or induration; pt wants six month surveillance   07/30/2015 office encounter  - hx of elev psa surveillance and fam hx of pca; pt has high anxiety regarding pca and insists on surveillance; , current psa 2.4 %free psa 30%; urine benign, exam shows 30 gm benign prostate l>r wo nodules or induration; pt wants six month surveillance  02/25/2016 office encounter  - continues with personal requested six month surveillance with exam and psa due to fam hx pca death; current psa stable 3.0 with reassuring %free psa 33%; denies hematuria or dysuria; ua dip and micro negative; exam shows 30 gm benign prostate wo nodules or induration; reassured pt no sign of pca risk other than his fam hx;   [id number] [facility] ED notes Dory Verdun is a 84 y.o. male with PMHX of UTI (6 weeks ago) who presents to the emergency department C/O recurrent burning dysuria onset 3 days ago. Associated sxs include increased frequency. Pt was seen at [facility] for his previous UTI  About 6 weeks ago and was prescribed a drug BID for 10 days, and states that sxs are consistent with UTI. Pt is seen by [name], MD, Urologist for BPH and urinary urgency. Pt states that he gets HA when he takes Sulfa. Pt states that he has "rampant" FHx of prostate CA. Pt denies fever, testicular pain, rash or swelling in the genitals, abdominal pain, and any other sxs or complaints. sx uti, ua shows pyuria, culture set up Rx keflex  09/06/2016 office encounter  - bph surveillance, auass 10 wo bother, tendency in past for ibe today pvr 160 cc but no bother; urine clear; denies hematuria or dysuria; exam shows 30 gm benign prostate wo nodulues or induration; pre exam f/t psa done due to pt insistence; will call  results; as long as ok then fu in six months   [id number] psa 2.59 %free psa 23%;   Cs, let pt know his psa remains normal 2.59   03/16/2017 office encounter  - bph surveillance, tolerates auass 15, pvr 99 cc denies hematuria or dysuria; f/t psa pending; fam hx of pca and pt apprehensive about pca; urine benign, exam shows 30 gm benign prostate wo nodules or induration, will call psa results to him  psa 2.11, ss, let pt know psa ok keep appt as planned; [id number]  09/13/2017 office encounter  - 84 yo w male with fam hx of pca has pca concenrs and wants to keep checking prostae and psa every sic months, f/t pss pending today; auass 16, pvr 80 cc, offered alpha blocker, declines; denies hematuria or dysuria; urine clear; exam shows 30 gm benign prostatewo nodules or induration; pree exam f/t psa pending  psa 2.2, ss/lh, let pt know psa stable 2.2      IBE /Weak Stream  POST VOID RESIDUAL  10/22/2007  04/21/2008  10/20/2008    PVR  72ml  58cc  310   11/19/2009 office encounter - auass 4, pvr 45 cc, pt wo bother   06/18/2012 office encounter  - auass 9, stream a little slower; pvr 163 cc (initially 269, had pt double void and pvr 163 cc) will recheck pvr in a month;   12/25/2012 office encounter  - auass 13, not bothered, pvr 184 cc;   07/09/2013 office encounter  - auass 12, pvr 98 cc, defers meds  09/27/2013 medical assistant encounter - pvr 80 cc  12/23/2013 office encounter  - auass 18; pvr 32 cc, some bother  02/25/2016 office encounter  - auass 9, no bother, no meds;   09/06/2016 office encounter  - auass 10, no bother, no meds; pvr 160 cc;   03/16/2017 office encounter  - auass 15, pvr 99 cc; declines meds  09/13/2017 office encounter  - auass 16, pvr 8 cc, declines meds, says doing ok      UTI  [id number] [facility] ED notes Dalvin Clipper is a 84 y.o. male with PMHX of UTI (6 weeks ago) who presents to the emergency department C/O recurrent burning dysuria onset 3 days ago. Associated sxs include increased frequency. Pt was seen at [facility] for his previous UTI  About 6 weeks ago and was prescribed a drug BID for 10 days, and states that sxs are consistent with  UTI. Pt is seen by [name], MD, Urologist for BPH and urinary urgency. Pt states that he gets HA when he takes Sulfa. Pt states that he has "rampant" FHx of prostate CA. Pt denies fever, testicular pain, rash or swelling in the genitals, abdominal pain, and any other sxs or complaints. sx uti, ua shows pyuria, culture set up Rx keflex  CULTURE SHOWS E COLI pans sensitive including the keflex he is taking  04/18/2016 telephone encounter - lynn/cs - work together on this - call pt to let him know has appt this thursday at 0900 - should have culture back by then, pt on keflex, reassure him that Dr Nicanor Alcon aware of his ER visit, please make sure the urine culture from [id number] is followed up on to make sure pt is on correct abx  Thanks/[id number]   04/21/2016 office encounter  - pt comes in for fu check after recent uti treated at [facility] ed, interestingly, he had been [facility] early [facility] - suspect  inadequate treatment with first uti; sxs resolved now; urine benign appearing exam benign; discussed an extended course of cipro 250 bid for two weeks along with probiotic to clear out any residual bacteria in the prostate; discussed black box warning for cipro, discussed importance of probiotic; will stop keflex now and start the cipro;   05/30/2016 medical assistant encounter - here for fu ua, wo sxs, urine clear, pvr 105 cc; culture set up  CULTURE no sig growth, cs, let pt know there was not bacteria growth and cultue and his urine looked clear; if doesn't have six month fu appt as pt requested in december, then arrange appt for " f/t psa//pvr/ua surveillance bph fam hx pca death hx uti hx of ibe" in mid june/[id number]  09/13/2017 office encounter  - denies uti sxs, urine clear today;       Family Hx of Prostate Cancer   11/19/2009 office encounter - reports 3 brothers with pca  12/06/2010 office encounter  - now his 82 yo son has been diagnosed with pca;  12/25/2012 office encounter  -  Sig fam hx of pca;        BPH w LUTS   Early 2000's microwave treatment - successful   [id number] Burnadette Peter(Lynch) notes This is a follow-up visit for Dena BilletHarold Domangue who has an established diagnosis of BPH with obstruction. There has been improvement of his symptoms. He reports nocturia one time a night. His current medications includes no medications. He has not experienced Problems when on these medications. Since his last visit he notes less frequency less nocturia less urgency   [id number] Burnadette Peter(Lynch) notes There has been improvement of his symptoms. He reports nocturia is rare now. He is bothered by some daytime urgency, which has become more noticeable over the past 6 mos. His current medications includes no medications. Since his last visit he notes less frequency less nocturia But more daytime urgency; PLAN Sanctura trial   There has been further improvement of his symptoms. He reports nocturia is rare now, and not more than once nightly. He was bothered by some daytime urgency, but began Sanctura with good improvement.   [phone] Burnadette Peter(Lynch) notes His current medications includes no BPH medications. He continues on ReunionSanctura, with good results.   [id number] Burnadette Peter(Lynch) notes There has been improvement of his symptoms. He reports nocturia one or two times a night. He is emptying well, with a PVR of 310 cc today. He is reasonably happy with his voiding despite this. His current medications includes Sanctura 60 mgm qHS. He has not experienced side effects on this medication.   [id number] [name](Oscar) notes irritable sxs controlled with sanctura, notes 40 gm benign prostate   10/04/2009 chart entry - chart reviewed, note pt has rx for sudafed and is on sanctura with 40 gm prostate - will discuss impact of this rx on bph sxs and increased pvr;   11/19/2009 office encounter - bph surveillance, reviewed risk of sudafed with bph - uses rarely; remains on sanctura xr on empty stomach, pleased with bladder control; discussed trial of cessation if he would like since  doing; denies hematuria or dysuria; urine is dip and micro negative, exam shows 30 gm benign prostate l>r wo Nodules; ok to trial cesation of sanctura if he'd like   05/01/2010 chart entry ??? changed insurance needs alternative for sanctura since not on his insurance approved list, meds listed include oxybutynin, oxybutynin er 5, 10, 15, enablex, gelnique, vesicare; will go with vesicare 10 mg #30 [id number], sent to  [facility] pharmacy  05/06/2010 chart entry ??? vesicare too expensive, pt wants generic, pt advised of short term memory loss associated with oxybutynin; Rx oxbutynin er 10 mg daily #30 [id number]  05/27/2010 office encounter  - pt opted to stay w vesicare and avoid side effects with the oxybutynin; his AUA symptom score (detailed in the documents flowsheet) is only 2 on vesicare; pt pleased; will titrate dose to every other and then every third; whatever minimum dosage is effective;   12/06/2010 office encounter  - weaned off vesicare, his AUA symptom score (detailed in the documents flowsheet) is 7; denies hematuria or dysuria; ua dip and micro negative;   12/08/2011 office encounter  - bph surveillance, no meds; pt satisfied with voiding; his AUA symptom score (detailed in the documents flowsheet) is 4, denies hematuria or dysuria; ua dip and micro negative; occl urgency  06/18/2012 office encounter  - not on any meds right now, stream a little slower; his AUA symptom score (detailed in the documents flowsheet) is 9; denies hematuria or dysuria; ua dip and micro negative; exam shows 30 gm benign prostate wo nodules or induration; will check pvr; pt defers any meds for now; if beomes bothered will call and we will trial flomax;   12/25/2012 office encounter  - bph surveillance, mild bother, pt defers meds in spite of stop and start nature to stream;   07/09/2013 office encounter  - bph surveillance, has enlarged prostate, tolerates his auass 12 ok; denies hematuria or dysuria;    09/27/2013 medical assistant encounter - psa increased to 4.44, spiked to 5.09 last fall so no particular worry here, pvr 80 cc, ua benign;    12/23/2013 office encounter  - bph /luts still some bother but not more so than his anxiety about pca and rising psa; his AUA symptom score (detailed in GU DATA/REVIEW OF LABS AND IMAGING below) is 18; \\  08/05/2014 office encounter  - bph surveillance, his AUA symptom score (detailed in GU DATA/REVIEW OF LABS AND IMAGING below) is 13, tolerates, denies gross hematuria or dysuria; ua dip and micro negative;  01/27/2015 office encounter  - denies gross hematuria or dysuria; ua dip and micro negative; his AUA symptom score (detailed in GU DATA/REVIEW OF LABS AND IMAGING below) is 18, offered alpha blocker pt declines  07/30/2015 office encounter  - bph surveillance, nlo meds and satisfied with voidning; his AUA symptom score (detailed in GU DATA/REVIEW OF LABS AND IMAGING below) is 11, denies hematuria or dysuria; ua dip and micro negative;  02/25/2016 office encounter  - bph surveillance, his AUA symptom score (detailed in GU DATA/REVIEW OF LABS AND IMAGING below) is 9 denies hematuria or dysuria; urine shows clear;     PAST MEDICAL HISTORY    Past Surgical History:   Procedure Laterality Date   ??? CARDIAC SURG PROCEDURE UNLIST     ??? HX ORTHOPAEDIC     ??? HX OTHER SURGICAL      subdural hematoma   ??? HX SHOULDER ARTHROSCOPY      bilateral        Past Medical History:   Diagnosis Date   ??? Anemia    ??? Atrial fibrillation (HCC)    ??? Benign localized hyperplasia of prostate with urinary obstruction and other lower urinary tract symptoms (LUTS)(600.21)    ??? BPH without obstruction/lower urinary tract symptoms    ??? Elevated PSA    ??? Family hx of prostate cancer    ??? Incomplete bladder emptying    ???  PVC (premature ventricular contraction)    ??? PVC (premature ventricular contraction)    ??? Sleep apnea    ??? Urgency of urination    ??? Urgency of urination      Current Outpatient Medications    Medication Sig Dispense Refill   ??? triamcinolone acetonide (KENALOG) 0.1 % ointment by IntraTYMPanic route.     ??? ferrous gluconate 324 mg (38 mg iron) tablet Take 324 mg by mouth.     ??? ascorbic acid, vitamin C, (VITAMIN C) 500 mg tablet Take  by mouth.     ??? cyanocobalamin (VITAMIN B-12) 2,500 mcg sublingual tablet Take 2,500 mcg by mouth daily.     ??? simvastatin (ZOCOR) 20 mg tablet   1   ??? sertraline (ZOLOFT) 50 mg tablet   3   ??? OMEPRAZOLE (PRILOSEC PO) Take  by mouth.     ??? acetaminophen (TYLENOL) 325 mg tablet Take  by mouth every four (4) hours as needed.     ??? montelukast (SINGULAIR) 10 mg tablet Take 10 mg by mouth daily.       ??? Cetirizine (ZYRTEC) 10 mg Cap Take  by mouth.       ??? aspirin delayed-release 81 mg tablet Take  by mouth daily.       ??? GUAIFENESIN (MUCINEX PO) Take  by mouth.         Allergies   Allergen Reactions   ??? Milk Contact Dermatitis     Other reaction(s): Contact Dermatitis   ??? Other Plant, Animal, Environmental Other (comments)     Pollens, trees, dust causes chronic rhinitis and congestion   ??? Sulfa (Sulfonamide Antibiotics) Unknown (comments)   ??? Tolmetin Other (comments)     Family History   Problem Relation Age of Onset   ??? Cancer Other    ??? Hypertension Other        Social  Social History     Tobacco Use   Smoking Status Never Smoker   Smokeless Tobacco Never Used     Social History     Substance and Sexual Activity   Alcohol Use No   ??? Alcohol/week: 0.5 oz   ??? Types: 1 Glasses of wine per week    Comment: very rare        REVIEW OF SYSTEMS  Constitutional: Fever: No  Skin: Rash: No  HEENT: Hearing difficulty: No  Eyes: Blurred vision: No  Cardiovascular: Chest pain: No  Respiratory: Shortness of breath: No  Gastrointestinal: Nausea/vomiting: No  Musculoskeletal: Back pain: No  Neurological: Weakness: No  Psychological: Memory loss: No  Comments/additional findings:      PHYSICAL EXAM  Visit Vitals  BP 112/60   Ht 5\' 7"  (1.702 m)   Wt 148 lb (67.1 kg)   BMI 23.18 kg/m??      GENERAL APPEARANCE:  84 y.o. Caucasian  male alert, cooperative, no distress, appears stated age not obese;  ABDOMEN:  flat soft, non-tender. Bowel sounds normal. No masses,  no organomegaly  BACK:  symmetric, no curvature. ROM normal. No CVA tenderness.  GENITOURINARY:    Penis:  normal, noncircumcised  Scrotum:  normal  Testes:  normal, no masses, size 20cc  Epididymides:  normal  Anus/Perineum:  within normal limits  Sphincter tone:  within normal limits  Rectum:  negative without mass, lesions or tenderness    Prostate:  30 gm benign prostate wo nodules or induration  Seminal vesicles:  nonpalpable  LYMPHATIC:  Cervical, supraclavicular, and axillary nodes normal.  GU DATA/REVIEW OF LABS AND IMAGING    AUA Assessment Score:  AUA Score: 16;    AUA Bother Rating: Mostly satisfied    AUA Symptom Score 09/13/2017   Over the past month how often have you had the sensation that your bladder was not completely empty after you finished urinating? 2   Over the past month, how often have had to urinate again less than 2 hours after you last finished urinating? 0   Over the past month, how often have you found you stopped and started again several times when you urinated? 4   Over the past month, how often have you found it difficult to postpone urination? 3   Over the past month, how often have you had a weak urinary stream? 3   Over the past month, how often have you had to push or strain to begin urinating? 2   Over the past month, how many times did you most typically get up to urinate from the time you went to bed at night until the time you got up in the morning? 2   AUA Score 16   If you were to spend the rest of your life with your urinary condition the way it is now, how would you feel about that? Mostly satisfied       Results for orders placed or performed in visit on 09/13/17   PSA (TOTAL, REFLEX TO FREE)   Result Value Ref Range    Prostate Specific Ag 2.20 0.00 - 4.00 ng/mL    AMB POC URINALYSIS DIP STICK AUTO W/ MICRO (MICRO RESULTS)   Result Value Ref Range    Color (UA POC) Yellow     Clarity (UA POC) Clear     Glucose (UA POC) Negative Negative    Bilirubin (UA POC) Negative Negative    Ketones (UA POC) Negative Negative    Specific gravity (UA POC) 1.015 1.001 - 1.035    Blood (UA POC) Negative Negative    pH (UA POC) 5.5 4.6 - 8.0    Protein (UA POC) Negative Negative    Urobilinogen (UA POC) 0.2 mg/dL 0.2 - 1    Nitrites (UA POC) Negative Negative    Leukocyte esterase (UA POC) Negative Negative    Epithelial cells (UA POC) 0     WBCs (UA POC) 0      RBCs (UA POC) 0      Bacteria (UA POC) None Negative    Crystals (UA POC)  Negative    Other (UA POC)     AMB POC PVR, MEAS,POST-VOID RES,US,NON-IMAGING   Result Value Ref Range    PVR 80 cc       Copy to Maryann Conners A, MD and other appropriate care team members eFaxed today.     [name], MD  09/13/2017

## 2017-09-13 NOTE — Progress Notes (Signed)
Progress Notes by [name], MD at 09/13/17 1030                Author: [name], MD  Service: --  Author Type: Physician       Filed: 09/18/17 1401  Encounter Date: 09/13/2017  Status: Addendum          Editor: [name], MD (Physician)          Related Notes: Original Note by [name], MD (Physician) filed at 09/13/17 1109                       [name], MD   [facility], [facility]   [address]   [place], [place] [address]   [phone]      Patient:  Jose Stafford   DOB:  Jun 01, 1927   Date of Service:  09/13/2017      ASSESSMENT/PLAN        Encounter Diagnoses        Name  Primary?         ?  Benign prostatic hyperplasia with lower urinary tract symptoms, symptom details unspecified  Yes     ?  Family history of malignant neoplasm of prostate       ?  Slowing of urinary stream       ?  Urgency of urination           ?  Incomplete emptying of bladder             Assessment:  pth  with fam hx of pca has pca concerns and insists on six month surveillance; psa remains stable and exam remains benign unchanged; voiding sxs are significatn but pt wo bother   Plan:  Pt aware of meds to help voiding; declines; psa pending today;  continue six month surveillance          HISTORY OF PRESENT ILLNESS   He is currently 84 y.o. and his primary care provider is [name], MD.        Chief Complaint       Patient presents with        ?  Elevated PSA           BPH w LUTS/Hx of Elevated PSA/Family Hx of PCA death    04/26/2007 psa 1.9; 081009 psa 2.3; 020810 psa 1.6; 080910 psa 2.2; 030111 psa 3.1; 259563 psa 2.9; 875643 psa 2.23; 329518 psa 2.5; 841660 psa 2.2; 630160 psa 2.2;  109323 psa 2.4; 100714 psa 5.09,%free  psa 19%; 102814 psa 2.3,%free psa 33.5%; 557322 psa 2.78 %free psa 32%; 071715 psa 4.44; 093015 psa 5.81 %free psa 24%; 052416 psa 2.91 %free psa 32%; 025427 psa 2.4 %free psa 30%; 110117 psa 3.0, %free psa 33%; 062376 psa 2.59 %free psa 23%; 283151 psa   2.11; 761607 psa 2.2;            10/04/2009 chart entry - pt has fu appt with me in September having been referred by Dr Burnadette Peter per his retirement; 84 yo with normal psa but  abnormal psa velocity; has bph;    11/19/2009 office encounter - Preexam psa pending, pt to call if hasn't heard psa report by two weeks; exam shows 30 gm prostate, l>r wo nodules;  reassured pt no sign of cancer; copy of psa to pcp planned;    Psa 2.9, reassuring letter to patient, copy of report to  Dr Donette Larry    05/20/2010  medical assistant encounter - psa draw 2.23;  appt near future   05/27/2010 office encounter  - elev psa surveillance in pt with family  hx of sig pca; psa range is 1.9 - 3.1, present 2.23; pt anxious about his psa and family hx wants six month checks; urine is micro benign; exam shows 30 gm prostate r>l wo nodules or induration; reassured pt no sign of prostate cancer but at risk due  to age and family hx; reassured him pca death unlikely for him;    12/06/2010  medical assistant encounter - psa draw, reassuring stable value of 2.5; appt near future   12/06/2010 office encounter  - elevated psa hx in pt with strong fam hx  of pca - brothers and his son and 3 nephews all with pca; reviewed his psa hx which is reassuring with stable value of 2.5; pt wo sxs; feels great - even though 72 he wishes active surveillance; exam shows 30 gm prostate r>l wo nodules or induration;  will continue six month surveillance   06/06/2011  medical assistant encounter - psa 2.2, cw, let pt know his psa improved normal at 2.2; arrange appt with me in six months for " ua surveillance  bph luts fam hx pca " - i recommend no psa testing; if he insists on psa testing, then get psa level two week prior;    12/08/2011 office encounter  - extensive fam hx of pca; here for fu exam,  psa's have been normal recently so pt deferred psa testing; exam shows 30 gm prostate R>L  wo nodules; continue six months surveillance   psa was drawn for some reason, psa  2.2; no more psa's, letter to patient   06/18/2012 office encounter  - pt insists on continued psa checks in addition  to prostate exam due to strong family hx of pca death; exam shows 30 gm prostate l>r wo nodules or induration; psa currently is 2.4, reviewed psa hx with im, recheck in six months;    12/25/2012 office encounter  - pt concerned about psa jump from 2.4 to  5; has sig fam hx pca; appetite good, weight stable, no unusual bone or joint pains; 30 gm prostate l>r wo nodules or induration; will recheck f/t psa in thee months, as long as stable, then fu exam in six months with f/t psa   01/08/2013  medical assistant encounter - pt not willing to wait three months for recheck psa, repeat psa 2.3 with reassuring %free psa 33.5%   01/14/2013 telephone encounter - debbie, call pt, im sure he already knows value but his repeat psa 2.3 and %free psa is  normal 33.5%, i recommend no further psa testing but pt insists on psa testing; cancel planned appt in January for psa testing; ask him when he wants his next psa done? Likely arrange nurse appt mid April for f/t psa and exam with me late April for "  pvr/ua surveillance fam hx pca bph luts ibe (confirm f/t psa prior) "    06/12/2013  medical assistant encounter - psa normal 2.78 %free psa 32%; pt has an invalid appt for april 8th, this was missed on checkout april 1st  and not rescheduled to a valid date and appt time; [name], please make sure this gets changed on Monday apirl 6th so pt doesn't come to office expecting to be seen on Wednesday   07/09/2013 office encounter  - current psa 2.78, pt with  fam hx of pca  in two brothers, one son with pca, one died of pca; pt has high anxiety about pca; reviewed his psa hx in detail with him, asided from transient spike last october, psa have been very reassuringly normal and with reassuring %free psa; 3xam today shows  35 gm benign prostate wo nodules or induration; pt wants continued six month surveillance with f/t psa and  exam;    09/27/2013  medical assistant encounter - psa increased to 4.44, spiked to 5.09 last fall so no particular worry here, pvr 80 cc, ua benign;     09/30/2013 telephone encounter - debbie, not sure why he came in for psa at this time - ask him reason when you call him;  yes, his psa did bump up a bit to 4.44 but that is not concerning, had spike last fall to 5.09; he has appts for f/t psa in late september and fu exam mid october; also let him know his urine ok and pvr ok;    12/23/2013 office encounter  -  psa has increased to 5.81 with favorable %free psa 24% - have finally reached his level of intolerance of my recommended ww/active surveillance; pt has fam hx of pca death and is very apprehensive about this rise in psa; has seen [facility] and lynch  in past; discussed options - he understands risks of nbxp including death from sepsis; he would like to see oncologist; will arrange for him to see Dr Given; offered him 12 core biopsy today; discussed targeted biopsy but best to go with 12 core first;  happy to see him in fu and do biopsy pending his discussion with Dr Given; pt understands reluctance to proceed with radiation therapy at age 84 with its risks and complications; certainly would not entertain surgery; will see what Dr Given has to say.   Exam today shows 35 gm benign prostate wo nodules or induration, left lobe maybe slightly larger than right    [id number] [facility] ONC CONSULT GIVEN notes IMPRESSION:  1. Variable PSA, highest of 5.81ng/ml in 11/2013 and most recently 3.3ng/ml in 12/2013. Has been on active surveillance in the past with Dr. Nicanor AlconLasater but requested consultation with oncology. I advised pt at his age that he should not get too excited about  his PSA, in fact the guidelines would not advise further screening. 2. BPH with mild LUTS, specifically weak FOS, followed by Dr. Nicanor AlconLasater PLAN:  1. FU with Dr. Nicanor AlconLasater q 6 months as planned    08/05/2014 office encounter  -  Preexam F/T PSA pending, pt to  call if hasn't heard  F/T PSA report by two weeks; sig fam hx of pca; exam shows 40 gm benign prostate wo nodules or induration; will make fu plans pending current  psa results   psa 2.91, %free psa 32%; letter to patient and results to pcp, gave pt choice of fu six months or a year.    01/27/2015 office encounter  - hx of elev psa, fam hx pca, pt has high  anxiety, current psa favorable 2.69 and %free psa favorable 31%; urine benign; exam shows 35 gm prostate L>R wo nodules or induration; pt wants six month surveillance    07/30/2015 office encounter  - hx of elev psa surveillance and fam hx of  pca; pt has high anxiety regarding pca and insists on surveillance; , current psa 2.4 %free psa 30%; urine benign, exam shows 30 gm benign prostate l>r wo nodules or induration; pt wants  six month surveillance   02/25/2016 office encounter  - continues with personal requested six month  surveillance with exam and psa due to fam hx pca death; current psa stable 3.0 with reassuring %free psa 33%; denies hematuria or dysuria; ua dip and micro negative; exam shows 30 gm benign prostate wo nodules or induration; reassured pt no sign of pca  risk other than his fam hx;    [id number] [facility] ED notes Jose Stafford is a 84 y.o. male with PMHX of UTI (6 weeks ago) who presents to the emergency department  C/O recurrent burning dysuria onset 3 days ago. Associated sxs include increased frequency. Pt was seen at [facility] for his previous UTI  About 6 weeks ago and was prescribed a drug BID for 10 days, and states that sxs are consistent with UTI.  Pt is seen by [name], MD, Urologist for BPH and urinary urgency. Pt states that he gets HA when he takes Sulfa. Pt states that he has "rampant" FHx of prostate CA. Pt denies fever, testicular pain, rash or swelling in the genitals, abdominal pain,  and any other sxs or complaints. sx uti, ua shows pyuria, culture set up Rx keflex   09/06/2016 office encounter  - bph surveillance, auass  10 wo bother, tendency  in past for ibe today pvr 160 cc but no bother; urine clear; denies hematuria or dysuria; exam shows 30 gm benign prostate wo nodulues or induration; pre exam f/t psa done due to pt insistence; will call results; as long as ok then fu in six months    062618 psa 2.59 %free psa 23%;   Cs, let pt know his psa remains normal 2.59   03/16/2017 office encounter  - bph surveillance, tolerates auass 15, pvr  99 cc denies hematuria or dysuria; f/t psa pending; fam hx of pca and pt apprehensive about pca; urine benign, exam shows 30 gm benign prostate wo nodules or induration, will call psa results to him   psa 2.11, ss, let pt know psa ok keep appt as planned; [id number]   09/13/2017 office encounter  - 84 yo w male with fam hx of pca has pca concenrs  and wants to keep checking prostae and psa every sic months, f/t pss pending today; auass 16, pvr 80 cc, offered alpha blocker, declines; denies hematuria or dysuria; urine clear; exam shows 30 gm benign prostatewo nodules or induration; pree exam f/t  psa pending   psa 2.2, ss/lh, let pt know psa stable 2.2         IBE /Weak Stream        POST VOID RESIDUAL   10/22/2007   04/21/2008   10/20/2008           PVR   72ml   58cc   310     11/19/2009 office encounter - auass 4, pvr 45 cc, pt wo bother    06/18/2012 office encounter  -  auass 9, stream a little slower; pvr 163 cc (initially 269, had pt double void and pvr 163 cc) will recheck pvr in a month;    12/25/2012 office encounter  - auass 13, not bothered, pvr 184 cc;    07/09/2013 office encounter  - auass 12, pvr 98 cc, defers meds   09/27/2013  medical assistant encounter - pvr 80 cc   12/23/2013 office encounter  -  auass 18; pvr 32 cc, some bother   02/25/2016 office encounter  - auass 9, no bother, no  meds;    09/06/2016 office encounter  - auass 10, no bother, no meds; pvr 160 cc;    03/16/2017 office encounter  - auass 15, pvr 99 cc; declines meds   09/13/2017 office encounter  - auass 16, pvr 8 cc, declines  meds, says doing  ok         UTI   [id number] [facility] ED notes Jose Stafford is a 84 y.o. male with PMHX of UTI (6 weeks ago) who presents to the emergency department  C/O recurrent burning dysuria onset 3 days ago. Associated sxs include increased frequency. Pt was seen at [facility] for his previous UTI  About 6 weeks ago and was prescribed a drug BID for 10 days, and states that sxs are consistent with UTI.  Pt is seen by [name], MD, Urologist for BPH and urinary urgency. Pt states that he gets HA when he takes Sulfa. Pt states that he has "rampant" FHx of prostate CA. Pt denies fever, testicular pain, rash or swelling in the genitals, abdominal pain,  and any other sxs or complaints. sx uti, ua shows pyuria, culture set up Rx keflex   CULTURE SHOWS E COLI pans sensitive including the keflex he is taking   04/18/2016 telephone encounter - lynn/cs - work together on this - call pt to let him know has appt this thursday at 0900  - should have culture back by then, pt on keflex, reassure him that Dr Nicanor Alcon aware of his ER visit, please make sure the urine culture from [id number] is followed up on to make sure pt is on correct abx  Thanks/[id number]    04/21/2016 office encounter  - pt comes in for fu check after recent uti  treated at [facility] ed, interestingly, he had been [facility] early jan [facility] - suspect inadequate treatment with first uti; sxs resolved now; urine benign appearing exam benign; discussed an extended course of cipro 250 bid for two weeks along  with probiotic to clear out any residual bacteria in the prostate; discussed black box warning for cipro, discussed importance of probiotic; will stop keflex now and start the cipro;    05/30/2016  medical assistant encounter - here for fu ua, wo sxs, urine clear, pvr 105 cc; culture set up   CULTURE no sig growth, cs, let pt know there was not bacteria growth and cultue and his urine looked clear; if doesn't have six month fu appt as pt requested in  december, then arrange appt for " f/t psa//pvr/ua surveillance bph fam hx pca death hx uti  hx of ibe" in mid june/[id number]   09/13/2017 office encounter  -  denies uti sxs, urine clear today;          Family Hx of Prostate Cancer    11/19/2009 office encounter - reports 3 brothers with pca   12/06/2010 office encounter  - now his 65 yo son has been diagnosed with  pca;   12/25/2012 office encounter  -  Sig fam hx of pca;          BPH w LUTS    Early 2000's microwave treatment - successful    [id number] Burnadette Peter) notes This is a follow-up visit for [name] who has an established diagnosis of BPH with obstruction. There has  been improvement of his symptoms. He reports nocturia one time a night. His current medications includes no medications. He has not experienced Problems when on these medications. Since his last  visit he notes less frequency less nocturia less urgency    [id number] Burnadette Peter) notes There has been improvement of his symptoms. He reports nocturia is rare now. He is bothered by some daytime urgency,  which has become more noticeable over the past 6 mos. His current medications includes no medications. Since his last visit he notes less frequency less nocturia But more daytime urgency; PLAN Sanctura trial    There has been further improvement of his symptoms. He reports nocturia is rare now, and not more than once nightly. He was bothered by some daytime urgency, but began Sanctura with good improvement.    [id number] Burnadette Peter) notes His current medications includes no BPH medications. He continues on Reunion, with good results.    [id number] Burnadette Peter) notes There has been improvement of his symptoms. He reports nocturia one or two times a night. He is emptying well,  with a PVR of 310 cc today. He is reasonably happy with his voiding despite this. His current medications includes Sanctura 60 mgm  qHS. He has not experienced side effects on this medication.    [id number] [name]) notes irritable sxs controlled with sanctura,  notes 40 gm benign prostate    10/04/2009 chart entry - chart reviewed, note pt has rx for sudafed and is on sanctura with 40 gm prostate - will discuss impact of  this rx on bph sxs and increased pvr;    11/19/2009 office encounter - bph surveillance, reviewed risk of sudafed with bph - uses rarely; remains on sanctura xr on empty stomach,  pleased with bladder control; discussed trial of cessation if he would like since doing; denies hematuria or dysuria; urine is dip and micro negative, exam shows 30 gm benign prostate l>r wo Nodules; ok to trial cesation of sanctura if he'd like    05/01/2010 chart entry  - changed insurance needs alternative for sanctura since not on his insurance approved list, meds listed include oxybutynin, oxybutynin er 5, 10, 15, enablex, gelnique, vesicare; will go  with vesicare 10 mg #30 rfx11, sent to [facility] pharmacy   05/06/2010 chart entry -  vesicare too expensive, pt wants generic, pt advised of short term memory loss associated with oxybutynin; Rx oxbutynin er 10 mg daily #30 rfx11   05/27/2010 office encounter  - pt opted to stay w vesicare and  avoid side effects with the oxybutynin; his AUA symptom score (detailed in the documents flowsheet ) is only 2 on vesicare; pt pleased; will titrate dose to every other and then every third; whatever minimum dosage is effective;    12/06/2010 office encounter  - weaned off vesicare,  his AUA symptom score (detailed in the documents flowsheet) is 7; denies  hematuria or dysuria; ua dip and micro negative;    12/08/2011 office encounter  - bph surveillance, no meds; pt  satisfied with voiding; his AUA symptom score (detailed in the documents flowsheet ) is 4, denies hematuria or dysuria; ua dip and micro negative; occl urgency   06/18/2012 office encounter  - not on any meds right now, stream  a little slower; his AUA symptom score (detailed in the documents flowsheet ) is 9; denies hematuria or dysuria; ua dip and micro negative; exam shows 30 gm  benign prostate wo nodules or induration; will check pvr; pt defers any meds for now; if beomes bothered will call  and we will trial flomax;    12/25/2012 office encounter  - bph surveillance, mild bother,  pt defers meds in spite of  stop and start nature to stream;    07/09/2013 office encounter  - bph surveillance, has enlarged  prostate, tolerates his auass 12 ok; denies hematuria or dysuria;    09/27/2013  medical assistant encounter - psa increased to 4.44, spiked to 5.09 last fall so no particular worry here, pvr 80 cc, ua benign;      12/23/2013 office encounter  - bph /luts still some bother but  not more so than his anxiety about pca and rising psa; his AUA symptom score (detailed in GU DATA/REVIEW OF LABS AND IMAGING below ) is 18; \\   08/05/2014 office encounter  - bph surveillance,  his AUA symptom score (detailed in GU DATA/REVIEW OF LABS AND IMAGING below) is  13, tolerates, denies gross hematuria or dysuria; ua dip and micro negative;   01/27/2015 office encounter  - denies gross  hematuria or dysuria; ua dip and micro negative; his AUA symptom score (detailed in GU DATA/REVIEW OF LABS AND IMAGING below ) is 18, offered alpha blocker pt declines   07/30/2015 office encounter  - bph surveillance, nlo meds and  satisfied with voidning; his AUA symptom score (detailed in GU DATA/REVIEW OF LABS AND IMAGING below ) is 11, denies hematuria or dysuria; ua dip and micro negative;   02/25/2016 office encounter  - bph surveillance,  his AUA symptom score (detailed in GU DATA/REVIEW OF LABS AND IMAGING below) is  9 denies hematuria or dysuria; urine shows clear;       PAST MEDICAL HISTORY        Past Surgical History:         Procedure  Laterality  Date          ?  CARDIAC SURG PROCEDURE UNLIST         ?  HX ORTHOPAEDIC         ?  HX OTHER SURGICAL              subdural hematoma          ?  HX SHOULDER ARTHROSCOPY              bilateral              Past Medical History:        Diagnosis  Date         ?  Anemia        ?  Atrial fibrillation (HCC)       ?  Benign localized hyperplasia of prostate with urinary obstruction and other lower urinary tract symptoms (LUTS)(600.21)       ?  BPH without obstruction/lower urinary tract symptoms       ?  Elevated PSA       ?  Family hx of prostate cancer       ?  Incomplete bladder emptying       ?  PVC (premature ventricular contraction)       ?  PVC (premature ventricular contraction)       ?  Sleep apnea       ?  Urgency of urination           ?  Urgency of urination            Current Outpatient Medications          Medication  Sig  Dispense  Refill           ?  triamcinolone acetonide (KENALOG) 0.1 % ointment  by IntraTYMPanic route.         ?  ferrous gluconate 324 mg (38 mg iron) tablet  Take 324 mg by mouth.         ?  ascorbic acid, vitamin C, (VITAMIN C) 500 mg tablet  Take  by mouth.         ?  cyanocobalamin (VITAMIN B-12) 2,500 mcg sublingual tablet  Take 2,500 mcg by mouth daily.         ?  simvastatin (ZOCOR) 20 mg tablet      1     ?  sertraline (ZOLOFT) 50 mg tablet      3     ?  OMEPRAZOLE (PRILOSEC PO)  Take  by mouth.         ?  acetaminophen (TYLENOL) 325 mg tablet  Take  by mouth every four (4) hours as needed.         ?  montelukast (SINGULAIR) 10 mg tablet  Take 10 mg by mouth daily.           ?  Cetirizine (ZYRTEC) 10 mg Cap  Take  by mouth.           ?  aspirin delayed-release 81 mg tablet  Take  by mouth daily.                 ?  GUAIFENESIN (MUCINEX PO)  Take  by mouth.                Allergies        Allergen  Reactions         ?  Milk  Contact Dermatitis             Other reaction(s): Contact Dermatitis         ?  Other Plant, Educational psychologist, Environmental  Other (comments)             Pollens, trees, dust causes chronic rhinitis and congestion         ?  Sulfa (Sulfonamide Antibiotics)  Unknown (comments)         ?  Tolmetin  Other (comments)          Family History         Problem  Relation  Age of Onset          ?  Cancer  Other            ?  Hypertension  Other              Social     Social History          Tobacco Use        Smoking Status  Never Smoker        Smokeless Tobacco  Never Used          Social History          Substance and Sexual Activity        Alcohol Use  No         ?  Alcohol/week:  0.5 oz         ?  Types:  1 Glasses of wine per week          Comment: very rare            REVIEW OF SYSTEMS   Constitutional: Fever: No   Skin: Rash: No   HEENT: Hearing difficulty: No   Eyes: Blurred vision: No   Cardiovascular: Chest pain: No   Respiratory: Shortness of breath: No   Gastrointestinal: Nausea/vomiting: No  Musculoskeletal: Back pain: No   Neurological: Weakness: No   Psychological: Memory loss: No   Comments/additional findings:        PHYSICAL EXAM   Visit Vitals      BP  112/60     Ht  5\' 7"  (1.702 m)     Wt  148 lb (67.1 kg)        BMI  23.18 kg/m??        GENERAL APPEARANCE:  84  y.o. Caucasian  male alert, cooperative, no distress, appears stated age  not obese;   ABDOMEN:  flat soft, non-tender. Bowel sounds normal. No masses,  no organomegaly   BACK:  symmetric, no curvature. ROM normal. No CVA tenderness.   GENITOURINARY:     Penis:  normal, noncircumcised   Scrotum:  normal   Testes:  normal, no masses, size 20cc   Epididymides:  normal   Anus/Perineum:  within normal limits   Sphincter tone:  within normal limits   Rectum:  negative without mass, lesions or tenderness     Prostate:  30 gm benign prostate wo nodules or induration   Seminal vesicles:  nonpalpable   LYMPHATIC:  Cervical, supraclavicular, and axillary nodes normal.         GU DATA/REVIEW OF LABS AND IMAGING      AUA Assessment Score:   AUA Score: 16;     AUA Bother Rating: Mostly satisfied         AUA Symptom Score  09/13/2017        Over the past month how often have you had the sensation that your bladder was not completely empty after you finished urinating?  2     Over the past month, how often have had to urinate again less than 2 hours after you last finished urinating?  0         Over the past month, how often have you found you stopped and started again several times when you urinated?  4        Over the past month, how often have you found it difficult to postpone urination?  3     Over the past month, how often have you had a weak urinary stream?  3     Over the past month, how often have you had to push or strain to begin urinating?  2     Over the past month, how many times did you most typically get up to urinate from the time you went to bed at night until the time you got up in  the morning?  2     AUA Score  16        If you were to spend the rest of your life with your urinary condition the way it is now, how would you feel about that?  Mostly satisfied             Results for orders placed or performed in visit on 09/13/17     PSA (TOTAL, REFLEX TO FREE)         Result  Value  Ref Range            Prostate Specific Ag  2.20  0.00 - 4.00 ng/mL       AMB POC URINALYSIS DIP STICK AUTO W/ MICRO (MICRO RESULTS)         Result  Value  Ref Range            Color (UA  POC)  Yellow         Clarity (UA POC)  Clear         Glucose (UA POC)  Negative  Negative       Bilirubin (UA POC)  Negative  Negative       Ketones (UA POC)  Negative  Negative       Specific gravity (UA POC)  1.015  1.001 - 1.035       Blood (UA POC)  Negative  Negative       pH (UA POC)  5.5  4.6 - 8.0       Protein (UA POC)  Negative  Negative       Urobilinogen (UA POC)  0.2 mg/dL  0.2 - 1       Nitrites (UA POC)  Negative  Negative       Leukocyte esterase (UA POC)  Negative  Negative       Epithelial cells (UA POC)  0         WBCs (UA POC)  0          RBCs (UA POC)  0          Bacteria (UA POC)  None  Negative       Crystals (UA POC)    Negative       Other (UA POC)           AMB POC PVR, MEAS,POST-VOID RES,US,NON-IMAGING         Result  Value  Ref Range            PVR  80  cc           Copy to [name], MD and other appropriate care team members eFaxed today.       [name], MD   09/13/2017

## 2017-09-25 NOTE — Telephone Encounter (Signed)
Left voicemail for pt to return call to office for results. Ok to give results per JDL.         psa 2.2, ss/lh, let pt know psa stable 2.2

## 2018-03-20 ENCOUNTER — Ambulatory Visit
Admit: 2018-03-20 | Discharge: 2018-03-20 | Payer: PRIVATE HEALTH INSURANCE | Attending: Urology | Primary: Family Medicine

## 2018-03-20 ENCOUNTER — Encounter: Attending: Urology | Primary: Family Medicine

## 2018-03-20 ENCOUNTER — Ambulatory Visit: Attending: Urology | Primary: Family Medicine

## 2018-03-20 DIAGNOSIS — N401 Enlarged prostate with lower urinary tract symptoms: Secondary | ICD-10-CM

## 2018-03-20 LAB — AMB POC URINALYSIS DIP STICK AUTO W/ MICRO (MICRO RESULTS)
Bilirubin (UA POC): NEGATIVE
Bilirubin, Urine, POC: NEGATIVE
Crystals (UA POC): NEGATIVE
Crystals (UA POC): NEGATIVE
Glucose (UA POC): NEGATIVE
Glucose, Urine, POC: NEGATIVE
Ketones (UA POC): NEGATIVE
Ketones, Urine, POC: NEGATIVE
Nitrite, Urine, POC: NEGATIVE
Nitrites (UA POC): NEGATIVE
Protein (UA POC): NEGATIVE
Protein, Urine, POC: NEGATIVE
RBC, Urine, POC: 0
RBCs (UA POC): 0
Specific Gravity, Urine, POC: 1.015 NA (ref 1.001–1.035)
Specific gravity (UA POC): 1.015 (ref 1.001–1.035)
Urobilinogen (UA POC): 0.2 (ref 0.2–1)
Urobilinogen, POC: 0.2 (ref 0.2–1)
WBC, Urine, POC: 0
WBCs (UA POC): 0
pH (UA POC): 6 (ref 4.6–8.0)
pH, Urine, POC: 6 NA (ref 4.6–8.0)

## 2018-03-20 NOTE — Progress Notes (Signed)
Jose Stafford presents today for lab draw per Dr. Lasater order.   Dr. Lasater was present in the clinic as incident to.     f/t psa  obtained via venipuncture without any difficulty.    Patient will be notified with lab results.       Orders Placed This Encounter   ??? PSA (TOTAL, REFLEX TO FREE)   ??? COLLECTION VENOUS BLOOD,VENIPUNCTURE       Jose Stafford Jose Stafford         AUA Symptom Score 03/20/2018   Over the past month how often have you had the sensation that your bladder was not completely empty after you finished urinating? 3   Over the past month, how often have had to urinate again less than 2 hours after you last finished urinating? 0   Over the past month, how often have you found you stopped and started again several times when you urinated? 3   Over the past month, how often have you found it difficult to postpone urination? 0   Over the past month, how often have you had a weak urinary stream? 3   Over the past month, how often have you had to push or strain to begin urinating? 1   Over the past month, how many times did you most typically get up to urinate from the time you went to bed at night until the time you got up in the morning? 1   AUA Score 11   If you were to spend the rest of your life with your urinary condition the way it is now, how would you feel about that? Mostly satisfied

## 2018-03-20 NOTE — Progress Notes (Addendum)
[name], MD  [facility]  [address]  [place], [place] [address]  [phone]    Patient:  Jose Stafford  DOB:  January 16, 1928  Date of Service:  03/20/2018    ASSESSMENT/PLAN    Encounter Diagnoses   Name Primary?   ??? Benign prostatic hyperplasia with lower urinary tract symptoms, symptom details unspecified Yes   ??? Family history of malignant neoplasm of prostate    ??? Incomplete bladder emptying    ??? Slowing of urinary stream        Assessment:  bph with fam hx of pca; bph luts wo much bother  Plan:  Continue six months surveillance as requested by patient      HISTORY OF PRESENT ILLNESS  He is currently 85 y.o. and his primary care provider is [name], MD.    Chief Complaint   Patient presents with   ??? Benign Prostatic Hypertrophy   ??? (LUTS) Lower Urinary Tract Symptoms       BPH w LUTS/Hx of Elevated PSA/Family Hx of PCA death   May 16, 2007 psa 1.9; 081009 psa 2.3; 020810 psa 1.6; 102111 psa 2.2; 735670 psa 3.1; 141030 psa 2.9; 131438 psa 2.23; 887579 psa 2.5; 728206 psa 2.2; 015615 psa 2.2;  379432 psa 2.4; 100714 psa 5.09,%free psa 19%; 102814 psa 2.3,%free psa 33.5%; 761470 psa 2.78 %free psa 32%; 071715 psa 4.44; 093015 psa 5.81 %free psa 24%; 052416 psa 2.91 %free psa 32%; 929574 psa 2.4 %free psa 30%; 110117 psa 3.0, %free psa 33%; 734037 psa 2.59 %free psa 23%; 010319 psa 2.11; 096438 psa 2.2; 010720 psa 2.36;            10/04/2009 chart entry - pt has fu appt with me in December 23, 2022 having been referred by Dr Burnadette Peter per his retirement; 85 yo with normal psa but abnormal psa velocity; has bph;   11/19/2009 office encounter - Preexam psa pending, pt to call if hasn't heard psa report by two weeks; exam shows 30 gm prostate, l>r wo nodules; reassured pt no sign of cancer; copy of psa to pcp planned;   Psa 2.9, reassuring letter to patient, copy of report to Dr Donette Larry   05/20/2010 medical assistant encounter - psa draw 2.23;  appt near future   05/27/2010 office encounter  - elev psa surveillance in pt with family hx of sig pca; psa range is 1.9 - 3.1, present 2.23; pt anxious about his psa and family hx wants six month checks; urine is micro benign; exam shows 30 gm prostate r>l wo nodules or induration; reassured pt no sign of prostate cancer but at risk due to age and family hx; reassured him pca death unlikely for him;   December 23, 2010 medical assistant encounter - psa draw, reassuring stable value of 2.5; appt near future  12/06/2010 office encounter  - elevated psa hx in pt with strong fam hx of pca - brothers and his son and 3 nephews all with pca; reviewed his psa hx which is reassuring with stable value of 2.5; pt wo sxs; feels great - even though 48 he wishes active surveillance; exam shows 30 gm prostate r>l wo nodules or induration; will continue six month surveillance  06/06/2011 medical assistant encounter - psa 2.2, cw, let pt know his psa improved normal at 2.2; arrange appt with me in six months for " ua surveillance bph luts fam hx pca " - i recommend no psa testing; if he insists on psa  testing, then get psa level two week prior;   12/08/2011 office encounter  - extensive fam hx of pca; here for fu exam, psa's have been normal recently so pt deferred psa testing; exam shows 30 gm prostate R>L  wo nodules; continue six months surveillance  psa was drawn for some reason, psa 2.2; no more psa's, letter to patient  06/18/2012 office encounter  - pt insists on continued psa checks in addition to prostate exam due to strong family hx of pca death; exam shows 30 gm prostate l>r wo nodules or induration; psa currently is 2.4, reviewed psa hx with im, recheck in six months;   12/25/2012 office encounter  - pt concerned about psa jump from 2.4 to 5; has sig fam hx pca; appetite good, weight stable, no unusual bone or joint pains; 30 gm prostate l>r wo nodules or induration; will recheck f/t psa  in thee months, as long as stable, then fu exam in six months with f/t psa  01/08/2013 medical assistant encounter - pt not willing to wait three months for recheck psa, repeat psa 2.3 with reassuring %free psa 33.5%  01/14/2013 telephone encounter - debbie, call pt, im sure he already knows value but his repeat psa 2.3 and %free psa is normal 33.5%, i recommend no further psa testing but pt insists on psa testing; cancel planned appt in January for psa testing; ask him when he wants his next psa done? Likely arrange nurse appt mid April for f/t psa and exam with me late April for " pvr/ua surveillance fam hx pca bph luts ibe (confirm f/t psa prior) "   06/12/2013 medical assistant encounter - psa normal 2.78 %free psa 32%; pt has an invalid appt for april 8th, this was missed on checkout april 1st and not rescheduled to a valid date and appt time; [name], please make sure this gets changed on Monday apirl 6th so pt doesn't come to office expecting to be seen on Wednesday  07/09/2013 office encounter  - current psa 2.78, pt with fam hx of pca in two brothers, one son with pca, one died of pca; pt has high anxiety about pca; reviewed his psa hx in detail with him, asided from transient spike last october, psa have been very reassuringly normal and with reassuring %free psa; 3xam today shows 35 gm benign prostate wo nodules or induration; pt wants continued six month surveillance with f/t psa and exam;   09/27/2013 medical assistant encounter - psa increased to 4.44, spiked to 5.09 last fall so no particular worry here, pvr 80 cc, ua benign;    09/30/2013 telephone encounter - debbie, not sure why he came in for psa at this time - ask him reason when you call him; yes, his psa did bump up a bit to 4.44 but that is not concerning, had spike last fall to 5.09; he has appts for f/t psa in late september and fu exam mid october; also let him know his urine ok and pvr ok;    12/23/2013 office encounter  - psa has increased to 5.81 with favorable %free psa 24% - have finally reached his level of intolerance of my recommended ww/active surveillance; pt has fam hx of pca death and is very apprehensive about this rise in psa; has seen [facility] and [facility] in past; discussed options - he understands risks of nbxp including death from sepsis; he would like to see oncologist; will arrange for him to see Dr Given; offered him 12 core  biopsy today; discussed targeted biopsy but best to go with 12 core first; happy to see him in fu and do biopsy pending his discussion with Dr Given; pt understands reluctance to proceed with radiation therapy at age 17 with its risks and complications; certainly would not entertain surgery; will see what Dr Given has to say.  Exam today shows 35 gm benign prostate wo nodules or induration, left lobe maybe slightly larger than right   [id number] [facility] ONC CONSULT GIVEN notes IMPRESSION: 1. Variable PSA, highest of 5.81ng/ml in 11/2013 and most recently 3.3ng/ml in 12/2013. Has been on active surveillance in the past with Dr. Nicanor Alcon but requested consultation with oncology. I advised pt at his age that he should not get too excited about his PSA, in fact the guidelines would not advise further screening. 2. BPH with mild LUTS, specifically weak FOS, followed by Dr. Nicanor Alcon PLAN: 1. FU with Dr. Nicanor Alcon q 6 months as planned   08/05/2014 office encounter  - Preexam F/T PSA pending, pt to call if hasn't heard F/T PSA report by two weeks; sig fam hx of pca; exam shows 40 gm benign prostate wo nodules or induration; will make fu plans pending current psa results  psa 2.91, %free psa 32%; letter to patient and results to pcp, gave pt choice of fu six months or a year.   01/27/2015 office encounter  - hx of elev psa, fam hx pca, pt has high anxiety, current psa favorable 2.69 and %free psa favorable 31%; urine  benign; exam shows 35 gm prostate L>R wo nodules or induration; pt wants six month surveillance   07/30/2015 office encounter  - hx of elev psa surveillance and fam hx of pca; pt has high anxiety regarding pca and insists on surveillance; , current psa 2.4 %free psa 30%; urine benign, exam shows 30 gm benign prostate l>r wo nodules or induration; pt wants six month surveillance  02/25/2016 office encounter  - continues with personal requested six month surveillance with exam and psa due to fam hx pca death; current psa stable 3.0 with reassuring %free psa 33%; denies hematuria or dysuria; ua dip and micro negative; exam shows 30 gm benign prostate wo nodules or induration; reassured pt no sign of pca risk other than his fam hx;   [id number] [facility] ED notes Evo Aderman is a 85 y.o. male with PMHX of UTI (6 weeks ago) who presents to the emergency department C/O recurrent burning dysuria onset 3 days ago. Associated sxs include increased frequency. Pt was seen at [facility] for his previous UTI  About 6 weeks ago and was prescribed a drug BID for 10 days, and states that sxs are consistent with UTI. Pt is seen by [name], MD, Urologist for BPH and urinary urgency. Pt states that he gets HA when he takes Sulfa. Pt states that he has "rampant" FHx of prostate CA. Pt denies fever, testicular pain, rash or swelling in the genitals, abdominal pain, and any other sxs or complaints. sx uti, ua shows pyuria, culture set up Rx keflex  09/06/2016 office encounter  - bph surveillance, auass 10 wo bother, tendency in past for ibe today pvr 160 cc but no bother; urine clear; denies hematuria or dysuria; exam shows 30 gm benign prostate wo nodulues or induration; pre exam f/t psa done due to pt insistence; will call results; as long as ok then fu in six months   062618 psa 2.59 %free psa 23%;   Cs, let pt  know his psa remains normal 2.59  03/16/2017 office encounter  - bph surveillance, tolerates auass 15, pvr 99  cc denies hematuria or dysuria; f/t psa pending; fam hx of pca and pt apprehensive about pca; urine benign, exam shows 30 gm benign prostate wo nodules or induration, will call psa results to him  psa 2.11, ss, let pt know psa ok keep appt as planned; [id number]  09/13/2017 office encounter  - 85 yo w male with fam hx of pca has pca concenrs and wants to keep checking prostae and psa every sic months, f/t pss pending today; auass 16, pvr 80 cc, offered alpha blocker, declines; denies hematuria or dysuria; urine clear; exam shows 30 gm benign prostatewo nodules or induration; pree exam f/t psa pending  psa 2.2, ss/lh, let pt know psa stable 2.2  03/20/2018 office encounter  - 85 yo w male with fam hx of pca, has pca concerns, wants prostate check and psa testing every six months; auass 11, pt doing well, f/t psa prior to exam; pt satisfied with voiding, last psa 2.2; will call the results, urine clear, exam shows 30 gm benign prostate wo nodules or induration;   psa 2.36, ss/lh, let pt know psa stable normal 2.36;         IBE /Weak Stream  POST VOID RESIDUAL  10/22/2007  04/21/2008  10/20/2008    PVR  72ml  58cc  310   11/19/2009 office encounter - auass 4, pvr 45 cc, pt wo bother   06/18/2012 office encounter  - auass 9, stream a little slower; pvr 163 cc (initially 269, had pt double void and pvr 163 cc) will recheck pvr in a month;   12/25/2012 office encounter  - auass 13, not bothered, pvr 184 cc;   07/09/2013 office encounter  - auass 12, pvr 98 cc, defers meds  09/27/2013 medical assistant encounter - pvr 80 cc  12/23/2013 office encounter  - auass 18; pvr 32 cc, some bother  02/25/2016 office encounter  - auass 9, no bother, no meds;   09/06/2016 office encounter  - auass 10, no bother, no meds; pvr 160 cc;   03/16/2017 office encounter  - auass 15, pvr 99 cc; declines meds  09/13/2017 office encounter  - auass 16, pvr 8 cc, declines meds, says doing ok  03/20/2018 office encounter  - auass 11,       UTI   [id number] [facility] ED notes Jose Stafford is a 85 y.o. male with PMHX of UTI (6 weeks ago) who presents to the emergency department C/O recurrent burning dysuria onset 3 days ago. Associated sxs include increased frequency. Pt was seen at [facility] for his previous UTI  About 6 weeks ago and was prescribed a drug BID for 10 days, and states that sxs are consistent with UTI. Pt is seen by [name], MD, Urologist for BPH and urinary urgency. Pt states that he gets HA when he takes Sulfa. Pt states that he has "rampant" FHx of prostate CA. Pt denies fever, testicular pain, rash or swelling in the genitals, abdominal pain, and any other sxs or complaints. sx uti, ua shows pyuria, culture set up Rx keflex  CULTURE SHOWS E COLI pans sensitive including the keflex he is taking  04/18/2016 telephone encounter - lynn/cs - work together on this - call pt to let him know has appt this thursday at 0900 - should have culture back by then, pt on keflex, reassure him that Dr Nicanor Alcon aware  of his ER visit, please make sure the urine culture from [id number] is followed up on to make sure pt is on correct abx  Thanks/[id number]   04/21/2016 office encounter  - pt comes in for fu check after recent uti treated at [facility] ed, interestingly, he had been [facility] - suspect inadequate treatment with first uti; sxs resolved now; urine benign appearing exam benign; discussed an extended course of cipro 250 bid for two weeks along with probiotic to clear out any residual bacteria in the prostate; discussed black box warning for cipro, discussed importance of probiotic; will stop keflex now and start the cipro;   05/30/2016 medical assistant encounter - here for fu ua, wo sxs, urine clear, pvr 105 cc; culture set up  CULTURE no sig growth, cs, let pt know there was not bacteria growth and cultue and his urine looked clear; if doesn't have six month fu appt as pt  requested in december, then arrange appt for " f/t psa//pvr/ua surveillance bph fam hx pca death hx uti hx of ibe" in mid june/[id number]  09/13/2017 office encounter  - denies uti sxs, urine clear today;   03/20/2018 office encounter  - denies uti sxs, urine benign      Family Hx of Prostate Cancer   11/19/2009 office encounter - reports 3 brothers with pca  12/06/2010 office encounter  - now his 43 yo son has been diagnosed with pca;  12/25/2012 office encounter  -  Sig fam hx of pca;       BPH w LUTS   Early 2000's microwave treatment - successful   [id number] Burnadette Peter) notes This is a follow-up visit for [name] who has an established diagnosis of BPH with obstruction. There has been improvement of his symptoms. He reports nocturia one time a night. His current medications includes no medications. He has not experienced Problems when on these medications. Since his last visit he notes less frequency less nocturia less urgency   [id number] Burnadette Peter) notes There has been improvement of his symptoms. He reports nocturia is rare now. He is bothered by some daytime urgency, which has become more noticeable over the past 6 mos. His current medications includes no medications. Since his last visit he notes less frequency less nocturia But more daytime urgency; PLAN Sanctura trial   There has been further improvement of his symptoms. He reports nocturia is rare now, and not more than once nightly. He was bothered by some daytime urgency, but began Sanctura with good improvement.   [id number] Burnadette Peter) notes His current medications includes no BPH medications. He continues on [place]union, with good results.   [id number] Burnadette Peter) notes There has been improvement of his symptoms. He reports nocturia one or two times a night. He is emptying well, with a PVR of 310 cc today. He is reasonably happy with his voiding despite this. His current medications includes Sanctura 60 mgm qHS. He has not experienced side effects on this medication.    [id number] [name]) notes irritable sxs controlled with sanctura, notes 40 gm benign prostate   10/04/2009 chart entry - chart reviewed, note pt has rx for sudafed and is on sanctura with 40 gm prostate - will discuss impact of this rx on bph sxs and increased pvr;   11/19/2009 office encounter - bph surveillance, reviewed risk of sudafed with bph - uses rarely; remains on sanctura xr on empty stomach, pleased with bladder control; discussed trial of cessation if he would  like since doing; denies hematuria or dysuria; urine is dip and micro negative, exam shows 30 gm benign prostate l>r wo Nodules; ok to trial cesation of sanctura if he'd like   05/01/2010 chart entry ??? changed insurance needs alternative for sanctura since not on his insurance approved list, meds listed include oxybutynin, oxybutynin er 5, 10, 15, enablex, gelnique, vesicare; will go with vesicare 10 mg #30 [id number], [facility]  05/06/2010 chart entry ??? vesicare too expensive, pt wants generic, pt advised of short term memory loss associated with oxybutynin; Rx oxbutynin er 10 mg daily #30 [id number]  05/27/2010 office encounter  - pt opted to stay w vesicare and avoid side effects with the oxybutynin; his AUA symptom score (detailed in the documents flowsheet) is only 2 on vesicare; pt pleased; will titrate dose to every other and then every third; whatever minimum dosage is effective;   12/06/2010 office encounter  - weaned off vesicare, his AUA symptom score (detailed in the documents flowsheet) is 7; denies hematuria or dysuria; ua dip and micro negative;   12/08/2011 office encounter  - bph surveillance, no meds; pt satisfied with voiding; his AUA symptom score (detailed in the documents flowsheet) is 4, denies hematuria or dysuria; ua dip and micro negative; occl urgency  06/18/2012 office encounter  - not on any meds right now, stream a little slower; his AUA symptom score (detailed in the documents flowsheet) is 9;  denies hematuria or dysuria; ua dip and micro negative; exam shows 30 gm benign prostate wo nodules or induration; will check pvr; pt defers any meds for now; if beomes bothered will call and we will trial flomax;   12/25/2012 office encounter  - bph surveillance, mild bother, pt defers meds in spite of stop and start nature to stream;   07/09/2013 office encounter  - bph surveillance, has enlarged prostate, tolerates his auass 12 ok; denies hematuria or dysuria;   09/27/2013 medical assistant encounter - psa increased to 4.44, spiked to 5.09 last fall so no particular worry here, pvr 80 cc, ua benign;    12/23/2013 office encounter  - bph /luts still some bother but not more so than his anxiety about pca and rising psa; his AUA symptom score (detailed in GU DATA/REVIEW OF LABS AND IMAGING below) is 18; \\  08/05/2014 office encounter  - bph surveillance, his AUA symptom score (detailed in GU DATA/REVIEW OF LABS AND IMAGING below) is 13, tolerates, denies gross hematuria or dysuria; ua dip and micro negative;  01/27/2015 office encounter  - denies gross hematuria or dysuria; ua dip and micro negative; his AUA symptom score (detailed in GU DATA/REVIEW OF LABS AND IMAGING below) is 18, offered alpha blocker pt declines  07/30/2015 office encounter  - bph surveillance, nlo meds and satisfied with voidning; his AUA symptom score (detailed in GU DATA/REVIEW OF LABS AND IMAGING below) is 11, denies hematuria or dysuria; ua dip and micro negative;  02/25/2016 office encounter  - bph surveillance, his AUA symptom score (detailed in GU DATA/REVIEW OF LABS AND IMAGING below) is 9 denies hematuria or dysuria; urine shows clear;     PAST MEDICAL HISTORY    Past Surgical History:   Procedure Laterality Date   ??? CARDIAC SURG PROCEDURE UNLIST     ??? HX CYST REMOVAL     ??? HX ORTHOPAEDIC     ??? HX OTHER SURGICAL      subdural hematoma   ??? HX SHOULDER ARTHROSCOPY  bilateral        Past Medical History:   Diagnosis Date   ??? Anemia     ??? Atrial fibrillation (HCC)    ??? Benign localized hyperplasia of prostate with urinary obstruction and other lower urinary tract symptoms (LUTS)(600.21)    ??? BPH without obstruction/lower urinary tract symptoms    ??? Elevated PSA    ??? Family hx of prostate cancer    ??? Incomplete bladder emptying    ??? PVC (premature ventricular contraction)    ??? PVC (premature ventricular contraction)    ??? Sleep apnea    ??? Urgency of urination    ??? Urgency of urination      Current Outpatient Medications   Medication Sig Dispense Refill   ??? calcium-cholecalciferol, d3, 600-125 mg-unit tab Take 1 Tab by mouth.     ??? rivastigmine tartrate (EXELON) 3 mg capsule Take 3 mg by mouth.     ??? ascorbic acid, vitamin C, (VITAMIN C) 500 mg tablet Take  by mouth.     ??? cyanocobalamin (VITAMIN B-12) 2,500 mcg sublingual tablet Take 2,500 mcg by mouth daily.     ??? simvastatin (ZOCOR) 20 mg tablet   1   ??? sertraline (ZOLOFT) 50 mg tablet   3   ??? OMEPRAZOLE (PRILOSEC PO) Take  by mouth.     ??? acetaminophen (TYLENOL) 325 mg tablet Take  by mouth every four (4) hours as needed.     ??? montelukast (SINGULAIR) 10 mg tablet Take 10 mg by mouth daily.       ??? Cetirizine (ZYRTEC) 10 mg Cap Take  by mouth.       ??? aspirin delayed-release 81 mg tablet Take  by mouth daily.       ??? GUAIFENESIN (MUCINEX PO) Take  by mouth.         Allergies   Allergen Reactions   ??? Milk Contact Dermatitis     Other reaction(s): Contact Dermatitis   ??? Other Plant, Animal, Environmental Other (comments)     Pollens, trees, dust causes chronic rhinitis and congestion   ??? Sulfa (Sulfonamide Antibiotics) Unknown (comments)   ??? Tolmetin Other (comments)     Family History   Problem Relation Age of Onset   ??? Cancer Other    ??? Hypertension Other        Social  Social History     Tobacco Use   Smoking Status Never Smoker   Smokeless Tobacco Never Used     Social History     Substance and Sexual Activity   Alcohol Use No   ??? Alcohol/week: 0.8 standard drinks    ??? Types: 1 Glasses of wine per week    Comment: very rare        REVIEW OF SYSTEMS  Constitutional: Fever: No  Skin: Rash: No  HEENT: Hearing difficulty: No  Eyes: Blurred vision: No  Cardiovascular: Chest pain: No  Respiratory: Shortness of breath: No  Gastrointestinal: Nausea/vomiting: No  Musculoskeletal: Back pain: No  Neurological: Weakness: No  Psychological: Memory loss: No  Comments/additional findings:      PHYSICAL EXAM  Visit Vitals  BP 90/50   Ht 5\' 7"  (1.702 m)   Wt 147 lb (66.7 kg)   BMI 23.02 kg/m??     GENERAL APPEARANCE:  85 y.o. Caucasian  male alert, cooperative, no distress, appears stated age not obese;  ABDOMEN:  flat soft, non-tender. Bowel sounds normal. No masses,  no organomegaly  BACK:  symmetric, no  curvature. ROM normal. No CVA tenderness.  GENITOURINARY:    Penis:  normal, noncircumcised  Scrotum:  normal  Testes:  normal, no masses, size 20cc  Epididymides:  normal  Anus/Perineum:  within normal limits  Sphincter tone:  within normal limits  Rectum:  negative without mass, lesions or tenderness    Prostate:  30 gm benign prostate wo nodules or induration  Seminal vesicles:  nonpalpable  LYMPHATIC:  Cervical, supraclavicular, and axillary nodes normal.      GU DATA/REVIEW OF LABS AND IMAGING    AUA Assessment Score:  AUA Score: 11;    AUA Bother Rating: Mostly satisfied    AUA Symptom Score 03/20/2018   Over the past month how often have you had the sensation that your bladder was not completely empty after you finished urinating? 3   Over the past month, how often have had to urinate again less than 2 hours after you last finished urinating? 0   Over the past month, how often have you found you stopped and started again several times when you urinated? 3   Over the past month, how often have you found it difficult to postpone urination? 0   Over the past month, how often have you had a weak urinary stream? 3   Over the past month, how often have you had to push or strain to begin  urinating? 1   Over the past month, how many times did you most typically get up to urinate from the time you went to bed at night until the time you got up in the morning? 1   AUA Score 11   If you were to spend the rest of your life with your urinary condition the way it is now, how would you feel about that? Mostly satisfied       Results for orders placed or performed in visit on 03/20/18   PSA (TOTAL, REFLEX TO FREE)   Result Value Ref Range    Prostate Specific Ag 2.36 0.00 - 4.00 ng/mL   AMB POC URINALYSIS DIP STICK AUTO W/ MICRO (MICRO RESULTS)   Result Value Ref Range    Color (UA POC) Yellow     Clarity (UA POC) Clear     Glucose (UA POC) Negative Negative    Bilirubin (UA POC) Negative Negative    Ketones (UA POC) Negative Negative    Specific gravity (UA POC) 1.015 1.001 - 1.035    Blood (UA POC) Trace Negative    pH (UA POC) 6.0 4.6 - 8.0    Protein (UA POC) Negative Negative    Urobilinogen (UA POC) 0.2 mg/dL 0.2 - 1    Nitrites (UA POC) Negative Negative    Leukocyte esterase (UA POC) Trace Negative    Epithelial cells (UA POC) na     WBCs (UA POC) 0      RBCs (UA POC) 0      Bacteria (UA POC) None Negative    Crystals (UA POC) Negative Negative    Other (UA POC)         Copy to [name], MD and other appropriate care team members eFaxed today.     [name], MD  03/20/2018

## 2018-03-20 NOTE — Progress Notes (Signed)
Jose Stafford presents today for lab draw per Dr. Nicanor Alcon order.   Dr. Nicanor Alcon was present in the clinic as incident to.     f/t psa  obtained via venipuncture without any difficulty.    Patient will be notified with lab results.       Orders Placed This Encounter   . PSA (TOTAL, REFLEX TO FREE)   . COLLECTION VENOUS BLOOD,VENIPUNCTURE       Bertram Gala         AUA Symptom Score 03/20/2018   Over the past month how often have you had the sensation that your bladder was not completely empty after you finished urinating? 3   Over the past month, how often have had to urinate again less than 2 hours after you last finished urinating? 0   Over the past month, how often have you found you stopped and started again several times when you urinated? 3   Over the past month, how often have you found it difficult to postpone urination? 0   Over the past month, how often have you had a weak urinary stream? 3   Over the past month, how often have you had to push or strain to begin urinating? 1   Over the past month, how many times did you most typically get up to urinate from the time you went to bed at night until the time you got up in the morning? 1   AUA Score 11   If you were to spend the rest of your life with your urinary condition the way it is now, how would you feel about that? Mostly satisfied

## 2018-03-20 NOTE — Progress Notes (Signed)
Progress Notes by [name], MD at 03/20/18 1030                Author: [name], MD  Service: --  Author Type: Physician       Filed: 03/22/18 0218  Encounter Date: 03/20/2018  Status: Addendum          Editor: [name], MD (Physician)          Related Notes: Original Note by [name], MD (Physician) filed at 03/20/18 1059                       [name], MD   [facility], [facility]   [address], [address]   [place], [place] [address]   [phone]      Patient:  Jose Stafford   DOB:  20-Sep-1927   Date of Service:  03/20/2018      ASSESSMENT/PLAN        Encounter Diagnoses        Name  Primary?         ?  Benign prostatic hyperplasia with lower urinary tract symptoms, symptom details unspecified  Yes     ?  Family history of malignant neoplasm of prostate       ?  Incomplete bladder emptying           ?  Slowing of urinary stream             Assessment:  bph  with fam hx of pca; bph luts wo much bother   Plan:  Continue six months surveillance as requested by patient         HISTORY OF PRESENT ILLNESS   He is currently 85 y.o. and his primary care provider is [name], MD.        Chief Complaint       Patient presents with        ?  Benign Prostatic Hypertrophy        ?  (LUTS) Lower Urinary Tract Symptoms           BPH w LUTS/Hx of Elevated PSA/Family Hx of PCA death    2007/05/06 psa 1.9; 081009 psa 2.3; 020810 psa 1.6; 080910 psa 2.2; 030111 psa 3.1; 914782 psa 2.9; 956213 psa 2.23; 086578 psa 2.5; 469629 psa 2.2; 528413 psa 2.2;  244010 psa 2.4; 100714 psa 5.09,%free  psa 19%; 102814 psa 2.3,%free psa 33.5%; 272536 psa 2.78 %free psa 32%; 071715 psa 4.44; 093015 psa 5.81 %free psa 24%; 052416 psa 2.91 %free psa 32%; 644034 psa 2.4 %free psa 30%; 110117 psa 3.0, %free psa 33%; 742595 psa 2.59 %free psa 23%; 010319 psa  2.11; 638756 psa 2.2; 010720 psa 2.36;             10/04/2009 chart entry - pt has fu appt with me in September having been referred by Dr  Burnadette Peter per his retirement; 85 yo with normal psa but  abnormal psa velocity; has bph;    11/19/2009 office encounter - Preexam psa pending, pt to call if hasn't heard psa report by two weeks; exam shows 30 gm prostate, l>r wo nodules;  reassured pt no sign of cancer; copy of psa to pcp planned;    Psa 2.9, reassuring letter to patient, copy of report to Dr Donette Larry    05/20/2010  medical assistant encounter - psa draw 2.23;  appt near future   05/27/2010 office  encounter  - elev psa surveillance in pt with family  hx of sig pca; psa range is 1.9 - 3.1, present 2.23; pt anxious about his psa and family hx wants six month checks; urine is micro benign; exam shows 30 gm prostate r>l wo nodules or induration; reassured pt no sign of prostate cancer but at risk due  to age and family hx; reassured him pca death unlikely for him;    12/28/2010  medical assistant encounter - psa draw, reassuring stable value of 2.5; appt near future   12/06/2010 office encounter  - elevated psa hx in pt with strong fam hx  of pca - brothers and his son and 3 nephews all with pca; reviewed his psa hx which is reassuring with stable value of 2.5; pt wo sxs; feels great - even though 60 he wishes active surveillance; exam shows 30 gm prostate r>l wo nodules or induration;  will continue six month surveillance   06/06/2011  medical assistant encounter - psa 2.2, cw, let pt know his psa improved normal at 2.2; arrange appt with me in six months for " ua surveillance  bph luts fam hx pca " - i recommend no psa testing; if he insists on psa testing, then get psa level two week prior;    12/08/2011 office encounter  - extensive fam hx of pca; here for fu exam,  psa's have been normal recently so pt deferred psa testing; exam shows 30 gm prostate R>L  wo nodules; continue six months surveillance   psa was drawn for some reason, psa 2.2; no more psa's, letter to patient   06/18/2012 office encounter  - pt insists on continued psa checks in addition  to  prostate exam due to strong family hx of pca death; exam shows 30 gm prostate l>r wo nodules or induration; psa currently is 2.4, reviewed psa hx with im, recheck in six months;    12/25/2012 office encounter  - pt concerned about psa jump from 2.4 to  5; has sig fam hx pca; appetite good, weight stable, no unusual bone or joint pains; 30 gm prostate l>r wo nodules or induration; will recheck f/t psa in thee months, as long as stable, then fu exam in six months with f/t psa   01/08/2013  medical assistant encounter - pt not willing to wait three months for recheck psa, repeat psa 2.3 with reassuring %free psa 33.5%   01/14/2013 telephone encounter - debbie, call pt, im sure he already knows value but his repeat psa 2.3 and %free psa is  normal 33.5%, i recommend no further psa testing but pt insists on psa testing; cancel planned appt in January for psa testing; ask him when he wants his next psa done? Likely arrange nurse appt mid April for f/t psa and exam with me late April for "  pvr/ua surveillance fam hx pca bph luts ibe (confirm f/t psa prior) "    06/12/2013  medical assistant encounter - psa normal 2.78 %free psa 32%; pt has an invalid appt for april 8th, this was missed on checkout april 1st  and not rescheduled to a valid date and appt time; [name], please make sure this gets changed on Monday apirl 6th so pt doesn't come to office expecting to be seen on Wednesday   07/09/2013 office encounter  - current psa 2.78, pt with fam hx of pca  in two brothers, one son with pca, one died of pca; pt has high anxiety about pca;  reviewed his psa hx in detail with him, asided from transient spike last october, psa have been very reassuringly normal and with reassuring %free psa; 3xam today shows  35 gm benign prostate wo nodules or induration; pt wants continued six month surveillance with f/t psa and exam;    09/27/2013  medical assistant encounter - psa increased to 4.44, spiked to 5.09 last fall so no particular worry  here, pvr 80 cc, ua benign;     09/30/2013 telephone encounter - debbie, not sure why he came in for psa at this time - ask him reason when you call him;  yes, his psa did bump up a bit to 4.44 but that is not concerning, had spike last fall to 5.09; he has appts for f/t psa in late september and fu exam mid october; also let him know his urine ok and pvr ok;    12/23/2013 office encounter  -  psa has increased to 5.81 with favorable %free psa 24% - have finally reached his level of intolerance of my recommended ww/active surveillance; pt has fam hx of pca death and is very apprehensive about this rise in psa; has seen [facility] and lynch  in past; discussed options - he understands risks of nbxp including death from sepsis; he would like to see oncologist; will arrange for him to see Dr Given; offered him 12 core biopsy today; discussed targeted biopsy but best to go with 12 core first;  happy to see him in fu and do biopsy pending his discussion with Dr Given; pt understands reluctance to proceed with radiation therapy at age 10 with its risks and complications; certainly would not entertain surgery; will see what Dr Given has to say.   Exam today shows 35 gm benign prostate wo nodules or induration, left lobe maybe slightly larger than right    110415 GU ONC CONSULT GIVEN notes IMPRESSION:  1. Variable PSA, highest of 5.81ng/ml in 11/2013 and most recently 3.3ng/ml in 12/2013. Has been on active surveillance in the past with Dr. Nicanor Alcon but requested consultation with oncology. I advised pt at his age that he should not get too excited about  his PSA, in fact the guidelines would not advise further screening. 2. BPH with mild LUTS, specifically weak FOS, followed by Dr. Nicanor Alcon PLAN:  1. FU with Dr. Nicanor Alcon q 6 months as planned    08/05/2014 office encounter  -  Preexam F/T PSA pending, pt to call if hasn't heard  F/T PSA report by two weeks; sig fam hx of pca; exam shows 40 gm benign prostate wo nodules or  induration; will make fu plans pending current  psa results   psa 2.91, %free psa 32%; letter to patient and results to pcp, gave pt choice of fu six months or a year.    01/27/2015 office encounter  - hx of elev psa, fam hx pca, pt has high  anxiety, current psa favorable 2.69 and %free psa favorable 31%; urine benign; exam shows 35 gm prostate L>R wo nodules or induration; pt wants six month surveillance    07/30/2015 office encounter  - hx of elev psa surveillance and fam hx of  pca; pt has high anxiety regarding pca and insists on surveillance; , current psa 2.4 %free psa 30%; urine benign, exam shows 30 gm benign prostate l>r wo nodules or induration; pt wants six month surveillance   02/25/2016 office encounter  - continues with personal requested six month  surveillance with exam and psa  due to fam hx pca death; current psa stable 3.0 with reassuring %free psa 33%; denies hematuria or dysuria; ua dip and micro negative; exam shows 30 gm benign prostate wo nodules or induration; reassured pt no sign of pca  risk other than his fam hx;    [id number] [facility] ED notes Jose Stafford is a 85 y.o. male with PMHX of UTI (6 weeks ago) who presents to the emergency department  C/O recurrent burning dysuria onset 3 days ago. Associated sxs include increased frequency. Pt was seen at [facility] for his previous UTI  About 6 weeks ago and was prescribed a drug BID for 10 days, and states that sxs are consistent with UTI.  Pt is seen by [name], MD, Urologist for BPH and urinary urgency. Pt states that he gets HA when he takes Sulfa. Pt states that he has "rampant" FHx of prostate CA. Pt denies fever, testicular pain, rash or swelling in the genitals, abdominal pain,  and any other sxs or complaints. sx uti, ua shows pyuria, culture set up Rx keflex   09/06/2016 office encounter  - bph surveillance, auass 10 wo bother, tendency  in past for ibe today pvr 160 cc but no bother; urine clear; denies hematuria or dysuria; exam  shows 30 gm benign prostate wo nodulues or induration; pre exam f/t psa done due to pt insistence; will call results; as long as ok then fu in six months    062618 psa 2.59 %free psa 23%;   Cs, let pt know his psa remains normal 2.59   03/16/2017 office encounter  - bph surveillance, tolerates auass 15, pvr  99 cc denies hematuria or dysuria; f/t psa pending; fam hx of pca and pt apprehensive about pca; urine benign, exam shows 30 gm benign prostate wo nodules or induration, will call psa results to him   psa 2.11, ss, let pt know psa ok keep appt as planned; [id number]   09/13/2017 office encounter  - 85 yo w male with fam hx of pca has pca concenrs  and wants to keep checking prostae and psa every sic months, f/t pss pending today; auass 16, pvr 80 cc, offered alpha blocker, declines; denies hematuria or dysuria; urine clear; exam shows 30 gm benign prostatewo nodules or induration; pree exam f/t  psa pending   psa 2.2, ss/lh, let pt know psa stable 2.2   03/20/2018 office encounter  - 85 yo w male with fam hx of pca, has pca  concerns, wants prostate check and psa testing every six months; auass 11, pt doing well, f/t psa prior to exam; pt satisfied with voiding, last psa 2.2; will call the results, urine clear, exam shows 30 gm benign prostate wo nodules or induration;    psa 2.36, ss/lh, let pt know psa stable normal 2.36;             IBE /Weak Stream        POST VOID RESIDUAL   10/22/2007   04/21/2008   10/20/2008           PVR   72ml   58cc   310     11/19/2009 office encounter - auass 4, pvr 45 cc, pt wo bother    06/18/2012 office encounter  -  auass 9, stream a little slower; pvr 163 cc (initially 269, had pt double void and pvr 163 cc) will recheck pvr in a month;    12/25/2012 office encounter  - auass 13, not  bothered, pvr 184 cc;    07/09/2013 office encounter  - auass 12, pvr 98 cc, defers meds   09/27/2013  medical assistant encounter - pvr 80 cc   12/23/2013 office encounter  -  auass 18; pvr 32 cc, some bother    02/25/2016 office encounter  - auass 9, no bother, no meds;    09/06/2016 office encounter  - auass 10, no bother, no meds; pvr 160 cc;    03/16/2017 office encounter  - auass 15, pvr 99 cc; declines meds   09/13/2017 office encounter  - auass 16, pvr 8 cc, declines meds, says doing  ok   03/20/2018 office encounter  - auass 11,          UTI   [id number] [facility] ED notes Jose Stafford is a 85 y.o. male with PMHX of UTI (6 weeks ago) who presents to the emergency department  C/O recurrent burning dysuria onset 3 days ago. Associated sxs include increased frequency. Pt was seen at [facility] for his previous UTI  About 6 weeks ago and was prescribed a drug BID for 10 days, and states that sxs are consistent with UTI.  Pt is seen by [name], MD, Urologist for BPH and urinary urgency. Pt states that he gets HA when he takes Sulfa. Pt states that he has "rampant" FHx of prostate CA. Pt denies fever, testicular pain, rash or swelling in the genitals, abdominal pain,  and any other sxs or complaints. sx uti, ua shows pyuria, culture set up Rx keflex   CULTURE SHOWS E COLI pans sensitive including the keflex he is taking   04/18/2016 telephone encounter - lynn/cs - work together on this - call pt to let him know has appt this thursday at 0900  - should have culture back by then, pt on keflex, reassure him that Dr Nicanor Alcon aware of his ER visit, please make sure the urine culture from [id number] is followed up on to make sure pt is on correct abx  Thanks/[id number]    04/21/2016 office encounter  - pt comes in for fu check after recent uti  treated at [facility] ed, interestingly, he had been [facility] - suspect inadequate treatment with first uti; sxs resolved now; urine benign appearing exam benign; discussed an extended course of cipro 250 bid for two weeks along  with probiotic to clear out any residual bacteria in the prostate; discussed black box warning for cipro, discussed importance of probiotic; will stop  keflex now and start the cipro;    05/30/2016  medical assistant encounter - here for fu ua, wo sxs, urine clear, pvr 105 cc; culture set up   CULTURE no sig growth, cs, let pt know there was not bacteria growth and cultue and his urine looked clear; if doesn't have six month fu appt as pt requested in december, then arrange appt for " f/t psa//pvr/ua surveillance bph fam hx pca death hx uti  hx of ibe" in mid june/[id number]   09/13/2017 office encounter  -  denies uti sxs, urine clear today;    03/20/2018 office encounter  - denies uti sxs, urine benign         Family Hx of Prostate Cancer    11/19/2009 office encounter - reports 3 brothers with pca   12/06/2010 office encounter  - now his 81 yo son has been diagnosed with  pca;   12/25/2012 office encounter  -  Sig fam hx  of pca;          BPH w LUTS    Early 2000's microwave treatment - successful    [id number] Burnadette Peter) notes This is a follow-up visit for Jose Stafford who has an established diagnosis of BPH with obstruction. There has  been improvement of his symptoms. He reports nocturia one time a night. His current medications includes no medications. He has not experienced Problems when on these medications. Since his last visit he notes less frequency less nocturia less urgency    [id number] Burnadette Peter) notes There has been improvement of his symptoms. He reports nocturia is rare now. He is bothered by some daytime urgency,  which has become more noticeable over the past 6 mos. His current medications includes no medications. Since his last visit he notes less frequency less nocturia But more daytime urgency; PLAN Sanctura trial    There has been further improvement of his symptoms. He reports nocturia is rare now, and not more than once nightly. He was bothered by some daytime urgency, but began Sanctura with good improvement.    [id number] Burnadette Peter) notes His current medications includes no BPH medications. He continues on [place]union, with good results.    [id number] Burnadette Peter) notes There  has been improvement of his symptoms. He reports nocturia one or two times a night. He is emptying well,  with a PVR of 310 cc today. He is reasonably happy with his voiding despite this. His current medications includes Sanctura 60 mgm  qHS. He has not experienced side effects on this medication.    [id number] [name]) notes irritable sxs controlled with sanctura, notes 40 gm benign prostate    10/04/2009 chart entry - chart reviewed, note pt has rx for sudafed and is on sanctura with 40 gm prostate - will discuss impact of  this rx on bph sxs and increased pvr;    11/19/2009 office encounter - bph surveillance, reviewed risk of sudafed with bph - uses rarely; remains on sanctura xr on empty stomach,  pleased with bladder control; discussed trial of cessation if he would like since doing; denies hematuria or dysuria; urine is dip and micro negative, exam shows 30 gm benign prostate l>r wo Nodules; ok to trial cesation of sanctura if he'd like    05/01/2010 chart entry  - changed insurance needs alternative for sanctura since not on his insurance approved list, meds listed include oxybutynin, oxybutynin er 5, 10, 15, enablex, gelnique, vesicare; will go  with vesicare 10 mg #30 rfx11, sent to [facility] pharmacy   05/06/2010 chart entry -  vesicare too expensive, pt wants generic, pt advised of short term memory loss associated with oxybutynin; Rx oxbutynin er 10 mg daily #30 rfx11   05/27/2010 office encounter  - pt opted to stay w vesicare and  avoid side effects with the oxybutynin; his AUA symptom score (detailed in the documents flowsheet ) is only 2 on vesicare; pt pleased; will titrate dose to every other and then every third; whatever minimum dosage is effective;    12/06/2010 office encounter  - weaned off vesicare,  his AUA symptom score (detailed in the documents flowsheet) is 7; denies  hematuria or dysuria; ua dip and micro negative;    12/08/2011 office encounter  - bph surveillance, no meds; pt  satisfied with  voiding; his AUA symptom score (detailed in the documents flowsheet ) is 4, denies hematuria or dysuria; ua dip and micro negative; occl urgency   06/18/2012 office encounter  - not on  any meds right now, stream  a little slower; his AUA symptom score (detailed in the documents flowsheet ) is 9; denies hematuria or dysuria; ua dip and micro negative; exam shows 30 gm benign prostate wo nodules or induration; will check pvr; pt defers any meds for now; if beomes bothered will call  and we will trial flomax;    12/25/2012 office encounter  - bph surveillance, mild bother,  pt defers meds in spite of stop and start nature to stream;    07/09/2013 office encounter  - bph surveillance, has enlarged  prostate, tolerates his auass 12 ok; denies hematuria or dysuria;    09/27/2013  medical assistant encounter - psa increased to 4.44, spiked to 5.09 last fall so no particular worry here, pvr 80 cc, ua benign;      12/23/2013 office encounter  - bph /luts still some bother but  not more so than his anxiety about pca and rising psa; his AUA symptom score (detailed in GU DATA/REVIEW OF LABS AND IMAGING below ) is 18; \\   08/05/2014 office encounter  - bph surveillance,  his AUA symptom score (detailed in GU DATA/REVIEW OF LABS AND IMAGING below) is  13, tolerates, denies gross hematuria or dysuria; ua dip and micro negative;   01/27/2015 office encounter  - denies gross  hematuria or dysuria; ua dip and micro negative; his AUA symptom score (detailed in GU DATA/REVIEW OF LABS AND IMAGING below ) is 18, offered alpha blocker pt declines   07/30/2015 office encounter  - bph surveillance, nlo meds and  satisfied with voidning; his AUA symptom score (detailed in GU DATA/REVIEW OF LABS AND IMAGING below ) is 11, denies hematuria or dysuria; ua dip and micro negative;   02/25/2016 office encounter  - bph surveillance,  his AUA symptom score (detailed in GU DATA/REVIEW OF LABS AND IMAGING below) is  9 denies hematuria or dysuria; urine  shows clear;       PAST MEDICAL HISTORY        Past Surgical History:         Procedure  Laterality  Date          ?  CARDIAC SURG PROCEDURE UNLIST         ?  HX CYST REMOVAL         ?  HX ORTHOPAEDIC         ?  HX OTHER SURGICAL              subdural hematoma          ?  HX SHOULDER ARTHROSCOPY              bilateral              Past Medical History:        Diagnosis  Date         ?  Anemia       ?  Atrial fibrillation (HCC)       ?  Benign localized hyperplasia of prostate with urinary obstruction and other lower urinary tract symptoms (LUTS)(600.21)       ?  BPH without obstruction/lower urinary tract symptoms       ?  Elevated PSA       ?  Family hx of prostate cancer       ?  Incomplete bladder emptying       ?  PVC (premature ventricular contraction)       ?  PVC (  premature ventricular contraction)       ?  Sleep apnea       ?  Urgency of urination           ?  Urgency of urination            Current Outpatient Medications          Medication  Sig  Dispense  Refill           ?  calcium-cholecalciferol, d3, 600-125 mg-unit tab  Take 1 Tab by mouth.         ?  rivastigmine tartrate (EXELON) 3 mg capsule  Take 3 mg by mouth.         ?  ascorbic acid, vitamin C, (VITAMIN C) 500 mg tablet  Take  by mouth.         ?  cyanocobalamin (VITAMIN B-12) 2,500 mcg sublingual tablet  Take 2,500 mcg by mouth daily.         ?  simvastatin (ZOCOR) 20 mg tablet      1     ?  sertraline (ZOLOFT) 50 mg tablet      3     ?  OMEPRAZOLE (PRILOSEC PO)  Take  by mouth.         ?  acetaminophen (TYLENOL) 325 mg tablet  Take  by mouth every four (4) hours as needed.         ?  montelukast (SINGULAIR) 10 mg tablet  Take 10 mg by mouth daily.           ?  Cetirizine (ZYRTEC) 10 mg Cap  Take  by mouth.           ?  aspirin delayed-release 81 mg tablet  Take  by mouth daily.                 ?  GUAIFENESIN (MUCINEX PO)  Take  by mouth.                Allergies        Allergen  Reactions         ?  Milk  Contact Dermatitis             Other  reaction(s): Contact Dermatitis         ?  Other Plant, Educational psychologistAnimal, Environmental  Other (comments)             Pollens, trees, dust causes chronic rhinitis and congestion         ?  Sulfa (Sulfonamide Antibiotics)  Unknown (comments)         ?  Tolmetin  Other (comments)          Family History         Problem  Relation  Age of Onset          ?  Cancer  Other            ?  Hypertension  Other             Social     Social History          Tobacco Use        Smoking Status  Never Smoker        Smokeless Tobacco  Never Used          Social History          Substance and Sexual Activity        Alcohol Use  No         ?  Alcohol/week:  0.8 standard drinks     ?  Types:  1 Glasses of wine per week          Comment: very rare            REVIEW OF SYSTEMS   Constitutional: Fever: No   Skin: Rash: No   HEENT: Hearing difficulty: No   Eyes: Blurred vision: No   Cardiovascular: Chest pain: No   Respiratory: Shortness of breath: No   Gastrointestinal: Nausea/vomiting: No   Musculoskeletal: Back pain: No   Neurological: Weakness: No   Psychological: Memory loss: No   Comments/additional findings:        PHYSICAL EXAM   Visit Vitals      BP  90/50     Ht  5\' 7"  (1.702 m)     Wt  147 lb (66.7 kg)        BMI  23.02 kg/m??        GENERAL APPEARANCE:  85  y.o. Caucasian  male alert, cooperative, no distress, appears stated age  not obese;   ABDOMEN:  flat soft, non-tender. Bowel sounds normal. No masses,  no organomegaly   BACK:  symmetric, no curvature. ROM normal. No CVA tenderness.   GENITOURINARY:     Penis:  normal, noncircumcised   Scrotum:  normal   Testes:  normal, no masses, size 20cc   Epididymides:  normal   Anus/Perineum:  within normal limits   Sphincter tone:  within normal limits   Rectum:  negative without mass, lesions or tenderness     Prostate:  30 gm benign prostate wo nodules or induration   Seminal vesicles:  nonpalpable   LYMPHATIC:  Cervical, supraclavicular, and axillary nodes normal.         GU DATA/REVIEW OF  LABS AND IMAGING      AUA Assessment Score:   AUA Score: 11;     AUA Bother Rating: Mostly satisfied         AUA Symptom Score  03/20/2018        Over the past month how often have you had the sensation that your bladder was not completely empty after you finished urinating?  3     Over the past month, how often have had to urinate again less than 2 hours after you last finished urinating?  0     Over the past month, how often have you found you stopped and started again several times when you urinated?  3     Over the past month, how often have you found it difficult to postpone urination?  0     Over the past month, how often have you had a weak urinary stream?  3     Over the past month, how often have you had to push or strain to begin urinating?  1     Over the past month, how many times did you most typically get up to urinate from the time you went to bed at night until the time you got up in  the morning?  1     AUA Score  11        If you were to spend the rest of your life with your urinary condition the way it is now, how would you feel about that?  Mostly satisfied             Results for orders placed or  performed in visit on 03/20/18     PSA (TOTAL, REFLEX TO FREE)         Result  Value  Ref Range            Prostate Specific Ag  2.36  0.00 - 4.00 ng/mL       AMB POC URINALYSIS DIP STICK AUTO W/ MICRO (MICRO RESULTS)         Result  Value  Ref Range            Color (UA POC)  Yellow         Clarity (UA POC)  Clear         Glucose (UA POC)  Negative  Negative       Bilirubin (UA POC)  Negative  Negative       Ketones (UA POC)  Negative  Negative       Specific gravity (UA POC)  1.015  1.001 - 1.035       Blood (UA POC)  Trace  Negative       pH (UA POC)  6.0  4.6 - 8.0       Protein (UA POC)  Negative  Negative       Urobilinogen (UA POC)  0.2 mg/dL  0.2 - 1       Nitrites (UA POC)  Negative  Negative       Leukocyte esterase (UA POC)  Trace  Negative       Epithelial cells (UA POC)  na         WBCs (UA  POC)  0          RBCs (UA POC)  0          Bacteria (UA POC)  None  Negative       Crystals (UA POC)  Negative  Negative            Other (UA POC)               Copy to [name], MD and other appropriate care team members eFaxed today.       [name], MD   03/20/2018

## 2018-03-21 LAB — PSA (TOTAL, REFLEX TO FREE)
PSA: 2.36 ng/mL (ref 0.00–4.00)
Prostate Specific Ag: 2.36 ng/mL (ref 0.00–4.00)

## 2018-03-22 NOTE — Telephone Encounter (Signed)
Spoke with patient and advised results. Patient verbalized his understanding.     ss/lh, let pt know psa stable normal 2.36;

## 2018-10-04 ENCOUNTER — Encounter: Payer: PRIVATE HEALTH INSURANCE | Attending: Urology | Primary: Family Medicine

## 2018-11-13 ENCOUNTER — Encounter: Payer: PRIVATE HEALTH INSURANCE | Attending: Urology | Primary: Family Medicine

## 2019-03-11 NOTE — Telephone Encounter (Signed)
-----   Message from [name] sent at 03/11/2019 10:06 AM EST -----  Regarding: Non-Urgent Medical Question  Contact: [phone]  I am not able to come to your office anymore because I have no way to get there   Thanks. However, my urination problem has worsened and I would like to consider the medication that you have alluded to but not prescribed because of the side effects.  Please advise me of the name of the medication and what the side effects are  so I can decide if I want to try it. Thanks.

## 2019-03-12 NOTE — Telephone Encounter (Signed)
Good Morning,  Pt called office to see if he could get a response to his mychart message he sent the other day about his urination issues. I read him Dr. Sharen Hones response:  advise him to ask his visiting doctor or pcp for trial of flomax 30 min after a meal. ??Since unable to fu here will not write tis rx  Pt verbally understood and said he will request from the visiting physician.  Thank you,  Troth

## 2020-04-22 ENCOUNTER — Emergency Department (HOSPITAL_COMMUNITY)
Admission: EM | Admit: 2020-04-22 | Discharge: 2020-04-22 | Disposition: A | Discharge status: Home / Self Care | Payer: Medicare HMO | Attending: Emergency Medicine | Admitting: Emergency Medicine

## 2020-04-22 ENCOUNTER — Encounter (HOSPITAL_COMMUNITY): Payer: Self-pay

## 2020-04-22 ENCOUNTER — Emergency Department (HOSPITAL_COMMUNITY): Payer: Medicare HMO

## 2020-04-22 DIAGNOSIS — N179 Acute kidney failure, unspecified: Secondary | ICD-10-CM | POA: Diagnosis not present

## 2020-04-22 DIAGNOSIS — I4891 Unspecified atrial fibrillation: Secondary | ICD-10-CM

## 2020-04-22 DIAGNOSIS — R55 Syncope and collapse: Secondary | ICD-10-CM

## 2020-04-22 DIAGNOSIS — R519 Headache, unspecified: Secondary | ICD-10-CM | POA: Insufficient documentation

## 2020-04-22 LAB — BASIC METABOLIC PANEL
Anion gap: 9 (ref 5–15)
BUN: 26 mg/dL — ABNORMAL HIGH (ref 8–23)
CO2: 23 mmol/L (ref 22–32)
Calcium: 8.6 mg/dL — ABNORMAL LOW (ref 8.9–10.3)
Chloride: 104 mmol/L (ref 98–111)
Creatinine, Ser: 1.41 mg/dL — ABNORMAL HIGH (ref 0.61–1.24)
GFR, Estimated: 47 mL/min — ABNORMAL LOW (ref >60)
Glucose, Bld: 77 mg/dL (ref 70–99)
Potassium: 4.4 mmol/L (ref 3.5–5.1)
Sodium: 136 mmol/L (ref 135–145)

## 2020-04-22 LAB — CBC
HCT: 38.1 % — ABNORMAL LOW (ref 39.0–52.0)
Hemoglobin: 12 g/dL — ABNORMAL LOW (ref 13.0–17.0)
MCH: 30 pg (ref 26.0–34.0)
MCHC: 31.5 g/dL (ref 30.0–36.0)
MCV: 95.3 fL (ref 80.0–100.0)
Platelets: 219 10*3/uL (ref 150–400)
RBC: 4 MIL/uL — ABNORMAL LOW (ref 4.22–5.81)
RDW: 12.8 % (ref 11.5–15.5)
WBC: 7.6 10*3/uL (ref 4.0–10.5)
nRBC: 0 % (ref 0.0–0.2)

## 2020-04-22 LAB — URINALYSIS, ROUTINE W REFLEX MICROSCOPIC
Bilirubin Urine: NEGATIVE
Glucose, UA: NEGATIVE mg/dL
Hgb urine dipstick: NEGATIVE
Ketones, ur: NEGATIVE mg/dL
Leukocytes,Ua: NEGATIVE
Nitrite: NEGATIVE
Protein, ur: NEGATIVE mg/dL
Specific Gravity, Urine: 1.01 (ref 1.005–1.030)
pH: 6 (ref 5.0–8.0)

## 2020-04-22 LAB — CBG MONITORING, ED: Glucose-Capillary: 72 mg/dL (ref 70–99)

## 2020-04-22 MED ORDER — SODIUM CHLORIDE 0.9 % IV BOLUS
500.0000 mL | Freq: Once | INTRAVENOUS | Status: AC
  Administered 2020-04-22: 500 mL via INTRAVENOUS

## 2020-04-22 NOTE — Discharge Instructions (Addendum)
Your blood work looks okay except elevated kidney function. Go to appointment cardiology tomorrow at 9 am Stay well hydrated

## 2020-04-22 NOTE — ED Notes (Signed)
Pt ambulated in room, pt heart rate went from 110-129bpm. Pt was not SOB, pt ambulated on his own.

## 2020-04-22 NOTE — ED Notes (Signed)
DC  Instructions reviewed with pt and family.  Pt and family verbalized understanding. Pt DC

## 2020-04-22 NOTE — ED Triage Notes (Signed)
[place] [facility] with [facility] for syncopal episode witnessed by family.   16G LAC, received saline Initial BP 76/48 now 102/58  A fib 70-130 rate with hx of same.  Pt alert, c.o right sided headache since waking up this morning, no neuro deficits.

## 2020-04-22 NOTE — ED Provider Notes (Signed)
[facility] EMERGENCY DEPARTMENT Provider Note   CSN: [id number] Arrival date & time: 04/22/20  1117     History Chief Complaint  Patient presents with  . Loss of Consciousness    Adrian [name] is a 87 y.o. male.  [facility] after witnessed syncopal episode while lying in his chair.  Patient normally takes naps but he was not responding afterwards.  His blood pressures in the 70s.  Patient admits he has not stayed hydrated.  No fever or infectious symptoms.  No recent chest pain or shortness of breath.  Currently no symptoms.  Patient has history of intermittent A. fib.  Patient had a right-sided headache this morning similar to his previous.  No concerns at this time.  No heart attack or blood clot history.        History reviewed. No pertinent past medical history.  There are no problems to display for this patient.   History reviewed. No pertinent surgical history.     No family history on file.     Home Medications Prior to Admission medications   Not on File    Allergies    Patient has no allergy information on record.  Review of Systems   Review of Systems  Constitutional: Negative for chills and fever.  HENT: Negative for congestion.   Eyes: Negative for visual disturbance.  Respiratory: Negative for shortness of breath.   Cardiovascular: Negative for chest pain.  Gastrointestinal: Negative for abdominal pain and vomiting.  Genitourinary: Negative for dysuria and flank pain.  Musculoskeletal: Negative for back pain, neck pain and neck stiffness.  Skin: Negative for rash.  Neurological: Positive for syncope, light-headedness and headaches.    Physical Exam Updated Vital Signs BP 119/70   Pulse 70   Temp 97.6 F (36.4 C) (Oral)   Resp (!) 27   Ht 5\' 6"  (1.676 m)   Wt 68 kg   SpO2 99%   BMI 24.21 kg/m   Physical Exam Vitals and nursing note reviewed.  Constitutional:      Appearance: He is  well-developed and well-nourished.  HENT:     Head: Normocephalic and atraumatic.     Mouth/Throat:     Mouth: Mucous membranes are dry.  Eyes:     General:        Right eye: No discharge.        Left eye: No discharge.     Conjunctiva/sclera: Conjunctivae normal.  Neck:     Trachea: No tracheal deviation.  Cardiovascular:     Rate and Rhythm: Normal rate and regular rhythm.     Heart sounds: No murmur heard.   Pulmonary:     Effort: Pulmonary effort is normal.     Breath sounds: Normal breath sounds.  Abdominal:     General: There is no distension.     Palpations: Abdomen is soft.     Tenderness: There is no abdominal tenderness. There is no guarding.  Musculoskeletal:        General: No swelling, tenderness or edema. Normal range of motion.     Cervical back: Normal range of motion and neck supple.  Skin:    General: Skin is warm.     Findings: No rash.  Neurological:     General: No focal deficit present.     Mental Status: He is alert and oriented to person, place, and time.     Cranial Nerves: No cranial nerve deficit.     Sensory: No  sensory deficit.     Motor: No weakness.     Coordination: Coordination normal.  Psychiatric:        Mood and Affect: Mood and affect and mood normal.     ED Results / Procedures / Treatments   Labs (all labs ordered are listed, but only abnormal results are displayed) Labs Reviewed  BASIC METABOLIC PANEL - Abnormal; Notable for the following components:      Result Value   BUN 26 (*)    Creatinine, Ser 1.41 (*)    Calcium 8.6 (*)    GFR, Estimated 47 (*)    All other components within normal limits  CBC - Abnormal; Notable for the following components:   RBC 4.00 (*)    Hemoglobin 12.0 (*)    HCT 38.1 (*)    All other components within normal limits  URINALYSIS, ROUTINE W REFLEX MICROSCOPIC  CBG MONITORING, ED    EKG EKG Interpretation  Date/Time:  Wednesday April 22 2020 11:22:46 EST Ventricular Rate:  82 PR  Interval:    QRS Duration: 82 QT Interval:  358 QTC Calculation: 418 R Axis:   -25 Text Interpretation: Atrial fibrillation Septal infarct , age undetermined Abnormal ECG Confirmed by Blane Ohara [phone]) on 04/22/2020 2:17:58 PM   Radiology CT HEAD WO CONTRAST  Result Date: 04/22/2020 CLINICAL DATA:  Mental status change.  Dizziness and weakness. EXAM: CT HEAD WITHOUT CONTRAST TECHNIQUE: Contiguous axial images were obtained from the base of the skull through the vertex without intravenous contrast. COMPARISON:  None. FINDINGS: Brain: Generalized atrophy. Negative for hydrocephalus. Mild white matter changes appear chronic. Negative for acute infarct, hemorrhage, mass. Vascular: Negative for hyperdense vessel Skull: No focal skeletal lesion. Degenerative changes C1-C2. Bilateral burr holes. Sinuses/Orbits: Paranasal sinuses clear. Bilateral cataract extraction. Other: None IMPRESSION: No acute abnormality. Generalized atrophy and mild chronic white matter ischemia. Electronically Signed   By: Marlan Palau M.D.   On: 04/22/2020 12:47    Procedures Procedures   Medications Ordered in ED Medications  sodium chloride 0.9 % bolus 500 mL (500 mLs Intravenous New Bag/Given 04/22/20 1601)    ED Course  I have reviewed the triage vital signs and the nursing notes.  Pertinent labs & imaging results that were available during my care of the patient were reviewed by me and considered in my medical decision making (see chart for details).    MDM Rules/Calculators/A&P                          Patient presents after syncopal episode and hypotension.  Patient's blood pressure has been normal while being monitored in the ER.  Patient responded to IV fluids from EMS.  Clinically patient was not keeping up with fluids since yesterday.  Other differentials include atypical ACS, head bleed, stroke, anemia, other.  Hemoglobin reviewed 12, no active bleeding.  Electrolytes unremarkable, creatinine elevated  and BUN elevated 1.4/26 respectively consistent with dehydration.  Patient has normal neurologic exam, vitals unremarkable at discharge.  Discussed observation patient prefers outpatient follow-up.  Discussed with cardiology and they arrange appointment from tomorrow at 9 in the morning with his A. fib and syncopal episode.  Patient and son comfortable this plan.  Oral fluids given.  Plan to ambulate before discharge.   Final Clinical Impression(s) / ED Diagnoses Final diagnoses:  Atrial fibrillation with RVR (HCC)  Syncope and collapse  Acute renal failure, unspecified acute renal failure type (HCC)    Rx /  DC Orders ED Discharge Orders    None       Blane Ohara, MD 04/22/20 [phone]

## 2020-04-22 NOTE — Progress Notes (Signed)
Cardiology Office Note:   Date:  04/23/2020  NAME:  Adrian [name]    MRN: [id number] DOB:  08-Mar-1928   PCP:  Pcp, No  Cardiologist:  No primary care provider on file.   Referring MD: No ref. provider found   Chief Complaint  Patient presents with  . Loss of Consciousness    History of Present Illness:   Adrian [name] is a 87 y.o. male with a hx of pAF, HTN, intracranial hemorrhage who is being seen today for the evaluation of atrial fibrillation.  He recently relocated from [facility] to [facility] last summer.  He moved closer to his daughter.  He is retired Art gallery manager.  His wife recently passed last summer.  He was followed by cardiology through [facility] system.  They ordered a stress test as detailed below in September that was normal.  He also underwent an echocardiogram that was normal.  He had a monitor that showed 1% A. fib burden.  Apparently yesterday around 10 AM he became very sleepy and drowsy.  His son was with him.  Apparently he was unable to wake him up.  His blood pressure was quite low 68/48.  He was in A. fib.  In emergency medical services to come to emergency room.  He was found to be in A. fib with RVR and hypotensive.  Etiology was thought to be related to dehydration.  Apparently he has not been drinking nearly enough water.  He is only reported 16 to 20 ounces of water per day.  This is not enough.  His EKG shows he is still in A. fib.  He cannot give anticoagulation due to an intracerebral hemorrhage she experienced on warfarin roughly 16 years ago.  He has no desire to be back on anticoagulation and I do agree with this.  He had no chest pain or shortness of breath yesterday.  He just reports low energy.  He walks the halls with a walker.  He requires assistance with bathing and dressing himself.  He describes no exertional chest pain or pressure.  No shortness of breath reported with exertion either.  No recent TSH.  Lab work in the emergency room  demonstrated a serum creatinine 1.41.  No white count.  Hemoglobin 12.0.  No anemia.  UA was inconsistent with infection.  No dysuria.  No cough fever chills reported.  He does report a history of PVCs.  His blood pressure is 118/56.  Heart rate 84.  He is in rate controlled A. fib.  He is never smoker.  He does not drink alcohol or use drugs.  He denies any symptoms from his atrial fibrillation.  He does not bother him.  He is currently DNR.  I do agree with this.  He will transition his care to our group as he lives in [place].  Seen in ER 04/22/2020 after syncope 2/2 dehydration.   Problem List 1. Intracranial hemorrhage  -while on coumadin in past 2. HTN 3. Persistent Afib  -CHADSVASC=3 (age, HTN) -EF 60-65% -normal MPI 11/2019 ([facility]) 4. OSA  Past Medical History: Past Medical History:  Diagnosis Date  . Arrhythmia   . GERD (gastroesophageal reflux disease)   . Intracranial hemorrhage (HCC)   . OSA (obstructive sleep apnea)     Past Surgical History: Past Surgical History:  Procedure Laterality Date  . CRANIOTOMY    . SHOULDER SURGERY      Current Medications: Current Meds  Medication Sig  . acetaminophen (TYLENOL) 500 MG  tablet Take by mouth.  Marland Kitchen aspirin 81 MG EC tablet Take by mouth.  . Calcium Carb-Cholecalciferol (CALCIUM 500 + D3) 500-200 MG-UNIT TABS 1 tablet with meals  . cetirizine (ZYRTEC) 10 MG tablet Take by mouth.  . esomeprazole (NEXIUM) 20 MG capsule 1 capsule  . guaifenesin (HUMIBID E) 400 MG TABS tablet Take by mouth.  . metoprolol tartrate (LOPRESSOR) 25 MG tablet Take 0.5 tablets (12.5 mg total) by mouth 2 (two) times daily.  . montelukast (SINGULAIR) 10 MG tablet 1 tablet  . rivastigmine (EXELON) 3 MG capsule Take 3 mg by mouth 2 (two) times daily.  . Saw Palmetto 500 MG CAPS Take by mouth.  . sertraline (ZOLOFT) 50 MG tablet Take 50 mg by mouth daily.  . [DISCONTINUED] simvastatin (ZOCOR) 10 MG tablet Take 10 mg by mouth at bedtime.      Allergies:    Patient has no allergy information on record.   Social History: Social History   Socioeconomic History  . Marital status: Widowed    Spouse name: Not on file  . Number of children: 7  . Years of education: Not on file  . Highest education level: Not on file  Occupational History  . Occupation: retired Immunologist  Tobacco Use  . Smoking status: Never Smoker  . Smokeless tobacco: Never Used  Substance and Sexual Activity  . Alcohol use: Never  . Drug use: Never  . Sexual activity: Not on file  Other Topics Concern  . Not on file  Social History Narrative  . Not on file   Social Determinants of Health   Financial Resource Strain: Not on file  Food Insecurity: Not on file  Transportation Needs: Not on file  Physical Activity: Not on file  Stress: Not on file  Social Connections: Not on file     Family History: The patient's family history includes Cancer in his father.  ROS:   All other ROS reviewed and negative. Pertinent positives noted in the HPI.     EKGs/Labs/Other Studies Reviewed:   The following studies were personally reviewed by me today:  EKG:  EKG is ordered today.  The ekg ordered today demonstrates atrial fibrillation heart rate 84, no acute ischemic changes, no evidence of prior infarction, and was personally reviewed by me.   Work-up [facility]  Holter monitor 11/2019 Patient monitored for 2 days. Analysis and rhythm strips revealed sinus rhythm with intermittent atrial fibrillation/flutter. Average rate 72 beats/min. Minimum heart rate 50 beats/min. Maximum heart rate 141 beats/min. Atrial fibrillation burden 1.1%. Frequent premature ventricular contractions represented 5% of data. Ventricular couplets, bigeminy and trigeminy noted. There were no pauses > 3 seconds in duration.   Lexiscan Cardiolite SPECT study 11/2019 Normal Cardiolite SPECT study. No myocardial ischemia noted on this pharmacologic stress protocol.   Echocardiogram  11/2019 . LeftVentricle: Systolic function is normal. EF: 60-65%. . LeftVentricle: There is mild hypertrophy. . LeftVentricle: No regional wall motion abnormalities noted. . LeftAtrium: Left atrium is mildly dilated. . RightVentricle: Right ventricle appears normal. . AorticValve: The aortic valve is tricuspid. The leaflets are mildly thickened and exhibit probably normal excursion. There is mild sclerosis. . AorticValve: Mild regurgitation. . MitralValve: Mitral valve structure is normal. . MitralValve: There is mild regurgitation.    Recent Labs: 04/22/2020: BUN 26; Creatinine, Ser 1.41; Hemoglobin 12.0; Platelets 219; Potassium 4.4; Sodium 136   Recent Lipid Panel No results found for: CHOL, TRIG, HDL, CHOLHDL, VLDL, LDLCALC, LDLDIRECT  Physical Exam:   VS:  BP Marland Kitchen)  118/56   Pulse 84   Ht 5\' 4"  (1.626 m)   Wt 152 lb (68.9 kg)   SpO2 97%   BMI 26.09 kg/m    Wt Readings from Last 3 Encounters:  04/23/20 152 lb (68.9 kg)  04/22/20 150 lb (68 kg)    General: Well nourished, well developed, in no acute distress Head: Atraumatic, normal size  Eyes: PEERLA, EOMI  Neck: Supple, no JVD Endocrine: No thryomegaly Cardiac: Normal S1, S2; irregular rhythm, no murmurs rubs or gallops Lungs: Clear to auscultation bilaterally, no wheezing, rhonchi or rales  Abd: Soft, nontender, no hepatomegaly  Ext: No edema, pulses 2+ Musculoskeletal: No deformities, BUE and BLE strength normal and equal Skin: Warm and dry, no rashes   Neuro: Alert and oriented to person, place, time, and situation, CNII-XII grossly intact, no focal deficits  Psych: Normal mood and affect   ASSESSMENT:   Adrian [name] is a 87 y.o. male who presents for the following: 1. Persistent atrial fibrillation (HCC)   2. Primary hypertension   3. Syncope and collapse     PLAN:   1. Persistent atrial fibrillation (HCC) -CHADSVASC=3 (age, HTN) -EF 60-65% -normal MPI 11/2019 ([facility]) -Back in atrial  fibrillation.  Had a syncopal event likely related dehydration.  No infectious etiology revealed.  His A. fib did not cause his symptoms.  He cannot give anticoagulation due to prior intracerebral hemorrhage on Coumadin.  He has no desire to retry any newer agents.  I recommended a rate control strategy.  He cannot undergo rhythm control given his prior history of of intracerebral hemorrhage and inability to tolerate blood thinners.  Given his age I would not recommend rhythm control either. -We will start metoprolol tartrate 12.5 twice a day just for simple rate control.  He needs to maintain adequate hydration.  He will let 12/2019 know if he can tolerate this.  I will see him back in 6 months.  2. Primary hypertension -Well-controlled. -Given his advanced age we will stop his statin.  No history of heart disease.  3. Syncope and collapse -Syncopal event was clearly related dehydration.  A. fib did not play a role.  He is not drinking enough water.  Recent stress test in September 2021 through the [facility] system was normal.  No evidence of ischemia or infarction.  Recent echocardiogram around that time also normal.  Cardiovascular examination shows no murmurs.  He is in A. fib that is rate controlled.  We will add metoprolol for this.  He should drink 60 to 80 ounces of water per day.  He is also made adequate p.o. intake.  He is recently moved from October 2021 and also his wife.  Suspect there is some element of depression but I think this clearly was related to dehydration.  Disposition: Return in about 6 months (around 10/21/2020).  Medication Adjustments/Labs and Tests Ordered: Current medicines are reviewed at length with the patient today.  Concerns regarding medicines are outlined above.  Orders Placed This Encounter  Procedures  . TSH   Meds ordered this encounter  Medications  . metoprolol tartrate (LOPRESSOR) 25 MG tablet    Sig: Take 0.5 tablets (12.5 mg total) by mouth 2 (two) times daily.     Dispense:  90 tablet    Refill:  2    Patient Instructions  Medication Instructions:  Stop Simvastatin Start Metoprolol Tartrate 12.5 mg twice daily   *If you need a refill on your cardiac medications before your next appointment, please call  your pharmacy*   Lab Work: TSH today  If you have labs (blood work) drawn today and your tests are completely normal, you will receive your results only by: Marland Kitchen MyChart Message (if you have MyChart) OR . A paper copy in the mail If you have any lab test that is abnormal or we need to change your treatment, we will call you to review the results.   Follow-Up: At [facility], you and your health needs are our priority.  As part of our continuing mission to provide you with exceptional heart care, we have created designated Provider Care Teams.  These Care Teams include your primary Cardiologist (physician) and Advanced Practice Providers (APPs -  Physician Assistants and Nurse Practitioners) who all work together to provide you with the care you need, when you need it.  We recommend signing up for the patient portal called "MyChart".  Sign up information is provided on this After Visit Summary.  MyChart is used to connect with patients for Virtual Visits (Telemedicine).  Patients are able to view lab/test results, encounter notes, upcoming appointments, etc.  Non-urgent messages can be sent to your provider as well.   To learn more about what you can do with MyChart, go to [email or web address].    Your next appointment:   6 month(s)  The format for your next appointment:   In Person  Provider:   Lennie Odor, MD   Other Instructions Drink 60-80 oz of water daily       Signed, Gerri Spore T. Flora Lipps, MD [facility]  [address], [address] [place], [place] [address] [phone]  04/23/2020 10:02 AM

## 2020-04-23 ENCOUNTER — Ambulatory Visit: Payer: Medicare HMO | Admitting: Cardiovascular Disease

## 2020-04-23 ENCOUNTER — Encounter: Payer: Self-pay | Admitting: Cardiovascular Disease

## 2020-04-23 ENCOUNTER — Other Ambulatory Visit: Payer: Self-pay

## 2020-04-23 VITALS — BP 118/56 | HR 84 | Ht 64.0 in | Wt 152.0 lb

## 2020-04-23 DIAGNOSIS — I4819 Other persistent atrial fibrillation: Secondary | ICD-10-CM

## 2020-04-23 DIAGNOSIS — I10 Essential (primary) hypertension: Secondary | ICD-10-CM | POA: Diagnosis not present

## 2020-04-23 DIAGNOSIS — I480 Paroxysmal atrial fibrillation: Secondary | ICD-10-CM

## 2020-04-23 DIAGNOSIS — R55 Syncope and collapse: Secondary | ICD-10-CM

## 2020-04-23 LAB — TSH: TSH: 4.19 u[IU]/mL (ref 0.450–4.500)

## 2020-04-23 MED ORDER — METOPROLOL TARTRATE 25 MG PO TABS: 12.5000 mg | ORAL_TABLET | Freq: Two times a day (BID) | ORAL | 2 refills | Status: DC

## 2020-04-23 NOTE — Patient Instructions (Signed)
Medication Instructions:  Stop Simvastatin Start Metoprolol Tartrate 12.5 mg twice daily   *If you need a refill on your cardiac medications before your next appointment, please call your pharmacy*   Lab Work: TSH today  If you have labs (blood work) drawn today and your tests are completely normal, you will receive your results only by: Marland Kitchen MyChart Message (if you have MyChart) OR . A paper copy in the mail If you have any lab test that is abnormal or we need to change your treatment, we will call you to review the results.   Follow-Up: At [facility], you and your health needs are our priority.  As part of our continuing mission to provide you with exceptional heart care, we have created designated Provider Care Teams.  These Care Teams include your primary Cardiologist (physician) and Advanced Practice Providers (APPs -  Physician Assistants and Nurse Practitioners) who all work together to provide you with the care you need, when you need it.  We recommend signing up for the patient portal called "MyChart".  Sign up information is provided on this After Visit Summary.  MyChart is used to connect with patients for Virtual Visits (Telemedicine).  Patients are able to view lab/test results, encounter notes, upcoming appointments, etc.  Non-urgent messages can be sent to your provider as well.   To learn more about what you can do with MyChart, go to [email or web address].    Your next appointment:   6 month(s)  The format for your next appointment:   In Person  Provider:   Lennie Odor, MD   Other Instructions Drink 60-80 oz of water daily

## 2020-05-04 NOTE — Addendum Note (Signed)
Addended by: Brunetta Genera on: 05/04/2020 01:56 PM   Modules accepted: Orders

## 2020-05-06 ENCOUNTER — Telehealth: Payer: Self-pay | Admitting: Cardiovascular Disease

## 2020-05-06 NOTE — Telephone Encounter (Signed)
I think he needs to be seen and agree with his upcoming appointment.  Unclear what is going on.  If he gets worse he should proceed to the emergency room.  Gerri Spore T. Flora Lipps, MD, [facility]  [facility]  [address], [address] [place], [place] [address] [phone]  12:08 PM

## 2020-05-06 NOTE — Telephone Encounter (Signed)
Spoke with son who talks to patient daily  Patient saw PCP that comes to facility this am and was advised to follow up with Dr Flora Lipps  Per son patient is having to stop and catch breath when talking on the phone  Last 2-3 days he has been feeling more tired but not like when he went into hospital  Felt like heart racing yesterday but machine reading HR 60's-70's  Blood pressure running 110's/60-70 lowest SBP 103 Will forward to Dr Flora Lipps for review

## 2020-05-06 NOTE — Telephone Encounter (Signed)
Pt c/o Shortness Of Breath: STAT if SOB developed within the last 24 hours or pt is noticeably SOB on the phone  1. Are you currently SOB (can you hear that pt is SOB on the phone)? Patient's son is not currently with patient, but spoke with him this morning and noticed SOB    2. How long have you been experiencing SOB? Believes it's been about 2 or 3 days  3. Are you SOB when sitting or when up moving around? Believes it is all the time  4. Are you currently experiencing any other symptoms?  Patient felt like his heart was racing, but the range was in 60's and 70's.  Patient's son states he spoke with the patient today and noticed he was SOB while speak to him. He states he is not currently with the patient. He states he has also been having lightheadedness and sleeps a lot. He states these symptoms have been happening about 2 or 3 days. He states the patient was seen by his PCP and was told to be seen Dr. Flora Lipps. He is scheduled 05/19/2020 with Corine Shelter

## 2020-05-06 NOTE — Telephone Encounter (Signed)
Spoke with son and gave recommendations  Moved patients appointment to tomorrow with Verdon Cummins NP

## 2020-05-07 ENCOUNTER — Ambulatory Visit: Payer: Medicare HMO | Admitting: General Practice

## 2020-05-07 ENCOUNTER — Other Ambulatory Visit: Payer: Self-pay

## 2020-05-07 ENCOUNTER — Encounter: Payer: Self-pay | Admitting: General Practice

## 2020-05-07 VITALS — BP 132/74 | HR 67 | Wt 162.2 lb

## 2020-05-07 DIAGNOSIS — R55 Syncope and collapse: Secondary | ICD-10-CM | POA: Diagnosis not present

## 2020-05-07 DIAGNOSIS — I10 Essential (primary) hypertension: Secondary | ICD-10-CM | POA: Diagnosis not present

## 2020-05-07 DIAGNOSIS — R0602 Shortness of breath: Secondary | ICD-10-CM

## 2020-05-07 DIAGNOSIS — I4819 Other persistent atrial fibrillation: Secondary | ICD-10-CM

## 2020-05-07 DIAGNOSIS — R5383 Other fatigue: Secondary | ICD-10-CM

## 2020-05-07 NOTE — Patient Instructions (Signed)
Medication Instructions:  The current medical regimen is effective;  continue present plan and medications as directed. Please refer to the Current Medication list given to you today.  *If you need a refill on your cardiac medications before your next appointment, please call your pharmacy*  Lab Work:   Testing/Procedures:  NONE    NONE  Special Instructions PLEASE MAINTAIN HYDRATION  PLEASE INCREASE PHYSICAL ACTIVITY AS TOLERATED  Follow-Up: Your next appointment:  1 month(s) In Person with Lennie Odor, MD OR IF UNAVAILABLE JESSE CLEAVER, FNP-C  At [facility], you and your health needs are our priority.  As part of our continuing mission to provide you with exceptional heart care, we have created designated Provider Care Teams.  These Care Teams include your primary Cardiologist (physician) and Advanced Practice Providers (APPs -  Physician Assistants and Nurse Practitioners) who all work together to provide you with the care you need, when you need it.

## 2020-05-07 NOTE — Progress Notes (Signed)
Cardiology Clinic Note   Patient Name: Adrian [name] Date of Encounter: 05/07/2020  Primary Care Provider:  Pcp, No Primary Cardiologist:  Reatha Harps, MD  Patient Profile    Adrian [name] 87 year old male presents the clinic today for an evaluation of his shortness of breath.    Past Medical History    Past Medical History:  Diagnosis Date  . Arrhythmia   . GERD (gastroesophageal reflux disease)   . Intracranial hemorrhage (HCC)   . OSA (obstructive sleep apnea)    Past Surgical History:  Procedure Laterality Date  . CRANIOTOMY    . SHOULDER SURGERY      Allergies  Not on File  History of Present Illness    Adrian [name] has a PMH of paroxysmal atrial fibrillation, hypertension, and intracranial hemorrhage.  He relocated from [facility] to [facility] summer 2021.  He moved to be closer to his daughter.  He is a retired Art gallery manager.  His wife recently passed away last summer.  He was followed by cardiology through the [facility] health system.  He underwent stress testing September 2021 which was normal.  He had an echocardiogram was also normal.  He wore a cardiac event monitor which showed 1% A. fib burden.  He was seen and evaluated by Dr. Flora Lipps 04/23/20.  He reported that the day before around 10 AM he became very sleepy and drowsy.  His son was with him.  He reported that he was unable to wake up his father.  His blood pressure was 68/48.  He was in atrial fibrillation time.  In the emergency room he was found to be in A. fib with RVR and hypotensive.  It was felt that he was dehydrated.  He reported that he had been drinking between 16 and 20 ounces per day.  He was not a candidate for anticoagulation due to intracerebral hemorrhage which he experienced while on warfarin 16 years prior.  He has no desire to be placed back on anticoagulation and due to his risk it was felt that he was not a good candidate.  He denied chest pain and shortness of  breath.  His main symptom was lack of energy.  He reported that he would walk in halls with his walker.  He needed assistance with bathing and dressing himself.  He denied shortness of breath at rest and with increased physical activity.  His serum creatinine in the emergency department was 1.41.  Hemoglobin was 12.0.  UA was completed not consistent with infection.  He denied cough fever chills.  He reported a history of PVCs.  Blood pressure increased to 118/56 and his heart rate was 84.  He was in rate controlled atrial fibrillation.  He denied symptoms from atrial fibrillation.  He reported it did not bother him.  He is a DNR.  He transitioned his care to [facility] because he was now living in [place].  He presents the clinic today after contacting the nurse triage line.  His son noted that he was having increased shortness of breath on 05/06/2020.  He presents the clinic today for follow-up evaluation states he has noticed increased fatigue over the last 2 weeks.  His daughter reports that he was started on 12.5 metoprolol around 2 weeks ago.  He is now drinking around 50 ounces of fluid daily consistently.  His blood pressure is 132/74 with a pulse of 67.  I will discontinue his metoprolol, have him continue his p.o. hydration, and have him  follow-up in 1 month.  We reviewed echocardiogram and if he continues to be fatigued in 1 month I recommend further evaluation with echocardiogram.  Today he denies chest pain, shortness of breath, lower extremity edema, palpitations, melena, hematuria, hemoptysis, diaphoresis, weakness, presyncope, syncope, orthopnea, and PND.  Home Medications    Prior to Admission medications   Medication Sig Start Date End Date Taking? Authorizing Provider  acetaminophen (TYLENOL) 500 MG tablet Take by mouth.    [provider]  aspirin 81 MG EC tablet Take by mouth.    [provider]  Calcium Carb-Cholecalciferol (CALCIUM 500 + D3) 500-200 MG-UNIT TABS 1  tablet with meals    [provider]  cetirizine (ZYRTEC) 10 MG tablet Take by mouth.    [provider]  esomeprazole (NEXIUM) 20 MG capsule 1 capsule    [provider]  guaifenesin (HUMIBID E) 400 MG TABS tablet Take by mouth.    [provider]  metoprolol tartrate (LOPRESSOR) 25 MG tablet Take 0.5 tablets (12.5 mg total) by mouth 2 (two) times daily. 04/23/20 07/22/20  Sande Rives, MD  montelukast (SINGULAIR) 10 MG tablet 1 tablet 12/02/19   [provider]  rivastigmine (EXELON) 3 MG capsule Take 3 mg by mouth 2 (two) times daily. 04/11/20   [provider]  Saw Palmetto 500 MG CAPS Take by mouth.    [provider]  sertraline (ZOLOFT) 50 MG tablet Take 50 mg by mouth daily. 03/31/20   [provider]    Family History    Family History  Problem Relation Age of Onset  . Cancer Father    He indicated that his mother is deceased. He indicated that his father is deceased.  Social History    Social History   Socioeconomic History  . Marital status: Widowed    Spouse name: Not on file  . Number of children: 7  . Years of education: Not on file  . Highest education level: Not on file  Occupational History  . Occupation: retired Immunologist  Tobacco Use  . Smoking status: Never Smoker  . Smokeless tobacco: Never Used  Substance and Sexual Activity  . Alcohol use: Never  . Drug use: Never  . Sexual activity: Not on file  Other Topics Concern  . Not on file  Social History Narrative  . Not on file   Social Determinants of Health   Financial Resource Strain: Not on file  Food Insecurity: Not on file  Transportation Needs: Not on file  Physical Activity: Not on file  Stress: Not on file  Social Connections: Not on file  Intimate Partner Violence: Not on file     Review of Systems    General:  No chills, fever, night sweats or weight changes.  Cardiovascular:  No chest pain, dyspnea on  exertion, edema, orthopnea, palpitations, paroxysmal nocturnal dyspnea. Dermatological: No rash, lesions/masses Respiratory: No cough, dyspnea Urologic: No hematuria, dysuria Abdominal:   No nausea, vomiting, diarrhea, bright red blood per rectum, melena, or hematemesis Neurologic:  No visual changes, wkns, changes in mental status. All other systems reviewed and are otherwise negative except as noted above.  Physical Exam    VS:  BP 132/74 (BP Location: Left Arm, Patient Position: Sitting, Cuff Size: Normal)   Pulse 67   Wt 162 lb 3.2 oz (73.6 kg)   SpO2 93%   BMI 27.84 kg/m  , BMI Body mass index is 27.84 kg/m. GEN: Well nourished, well developed, in no  acute distress. HEENT: normal. Neck: Supple, no JVD, carotid bruits, or masses. Cardiac: Irregularly irregular, no murmurs, rubs, or gallops. No clubbing, cyanosis, edema.  Radials/DP/PT 2+ and equal bilaterally.  Respiratory:  Respirations regular and unlabored, clear to auscultation bilaterally. GI: Soft, nontender, nondistended, BS + x 4. MS: no deformity or atrophy. Skin: warm and dry, no rash. Neuro:  Strength and sensation are intact. Psych: Normal affect.  Accessory Clinical Findings    Recent Labs: 04/22/2020: BUN 26; Creatinine, Ser 1.41; Hemoglobin 12.0; Platelets 219; Potassium 4.4; Sodium 136 04/23/2020: TSH 4.190   Recent Lipid Panel No results found for: CHOL, TRIG, HDL, CHOLHDL, VLDL, LDLCALC, LDLDIRECT  ECG personally reviewed by me today-none today.  EKG 04/23/2020  atrial fibrillation 84 bpm no acute ischemic changes, no evidence of prior infarct  Work-up [facility]  Holter monitor 11/2019 Patient monitored for 2 days. Analysis and rhythm strips revealed sinus rhythm with intermittent atrial fibrillation/flutter. Average rate 72 beats/min. Minimum heart rate 50 beats/min. Maximum heart rate 141 beats/min. Atrial fibrillation burden 1.1%. Frequent premature ventricular contractions represented 5% of data.  Ventricular couplets, bigeminy and trigeminy noted. There were no pauses > 3 seconds in duration.   Lexiscan Cardiolite SPECT study 11/2019 Normal Cardiolite SPECT study. No myocardial ischemia noted on this pharmacologic stress protocol.   Echocardiogram 11/2019 . LeftVentricle: Systolic function is normal. EF: 60-65%. . LeftVentricle: There is mild hypertrophy. . LeftVentricle: No regional wall motion abnormalities noted. . LeftAtrium: Left atrium is mildly dilated. . RightVentricle: Right ventricle appears normal. . AorticValve: The aortic valve is tricuspid. The leaflets are mildly thickened and exhibit probably normal excursion. There is mild sclerosis. . AorticValve: Mild regurgitation. . MitralValve: Mitral valve structure is normal. . MitralValve: There is mild regurgitation.   Assessment & Plan   1.  Shortness of breath/fatigue -denies increased work of breathing at rest and with increased physical activity.  Oxygen saturation 93%.  Does report that he is less physically active since relocating to [place] 1 year ago.  Continues to use his walker for support with ambulating.  Appears that his increase shortness of breath is related to his atrial fibrillation versus dehydration versus deconditioning.   Increase physical activity as tolerated Heart healthy low-sodium diet Maintain p.o. hydration Stop metoprolol  Atrial fibrillation-heart rate today 67.  Cardiac unaware.  Not a candidate for anticoagulation due to intracerebral hemorrhage while on Coumadin 16 years ago.  Patient has no desire for restarting anticoagulation. Continue metoprolol Heart healthy low-sodium diet-salty 6 given Increase physical activity as tolerated Increase p.o. hydration Avoid triggers caffeine, chocolate, EtOH, dehydration etc.  Hypertension-BP today 132/74.  Well-controlled at home. Continue metoprolol Heart healthy low-sodium diet-salty 6 given Increase physical activity as  tolerated  Syncope-no reports of falls, presyncope or syncope.  Denies dizziness or lightheadedness.  Has been working on increasing his hydration.  Previously recommended 60-80 ounces of water per day. Continue to monitor.  Disposition: Follow-up with Dr. Flora Lipps in 1 months.   Thomasene Ripple. Lemuel Boodram NP-C    05/07/2020, 4:21 PM [facility] [address] Office [phone] Fax [phone]  Notice: This dictation was prepared with Dragon dictation along with smaller phrase technology. Any transcriptional errors that result from this process are unintentional and may not be corrected upon review.  I spent 12 minutes examining this patient, reviewing medications, and using patient centered shared decision making involving her cardiac care.  Prior to her visit I spent greater than 20 minutes reviewing her past medical  history,  medications, and prior cardiac tests.

## 2020-05-12 ENCOUNTER — Encounter: Payer: Self-pay | Admitting: *Deleted

## 2020-05-19 ENCOUNTER — Ambulatory Visit: Payer: Medicare HMO | Admitting: Cardiology

## 2020-05-20 ENCOUNTER — Other Ambulatory Visit: Payer: Self-pay

## 2020-05-20 ENCOUNTER — Telehealth: Payer: Self-pay | Admitting: Cardiovascular Disease

## 2020-05-20 DIAGNOSIS — R0602 Shortness of breath: Secondary | ICD-10-CM

## 2020-05-20 NOTE — Telephone Encounter (Signed)
Pt c/o Shortness Of Breath: STAT if SOB developed within the last 24 hours or pt is noticeably SOB on the phone  1. Are you currently SOB (can you hear that pt is SOB on the phone)? No, was having it 10 minutes ago   2. How long have you been experiencing SOB? 2 weeks, but noticeably worse the past 2 days.   3. Are you SOB when sitting or when up moving around? Both   4. Are you currently experiencing any other symptoms? Tired dosing off all the time, complains of his body hurting and says he doesn't feel good   Adrian [name] is calling stating Adrian [name] saw his PCP today and they advised him to call and schedule an appt for him to be seen by Dr. Flora Lipps as soon as possible. He states they beileve his SOB is due to heart related issues. The PCP reported he did not hear fluid in his lungs, so they did not want to make any changes until he was seen by his Cardiologist. Adrian [name] states 10 minutes before the call he was gasping for air while sitting down. This has been ongoing for the past 2 weeks, but has worsened recently. Pt is already scheduled to see Dr. Flora Lipps on 06/12/20 and the first available for sooner on 05/28/20 was offered, but he requested a nurse callback to possible have him worked in for sooner than that. Please advise.

## 2020-05-20 NOTE — Telephone Encounter (Signed)
Called and spoke to patients son Adrian [name]. He states that for the last few weeks his dad has had shortness of breath, whenever he gets up to walk around he becomes extremely short of breath and takes him a minute to catch his breath. He advised this has worsened over the last few days. He denies chest pain, no swelling that he notices, but weight is up to 156 lbs. He has no energy and states he just does not feel well. PCP at facility seen him today and suggested he see Dr.O'Neal as soon as he can. I advised of an appointment on 03/15 with Dr.O'Neal, they verbalized understanding of this appointment. Explained to son of when to have his dad go to ED- worsening shortness of breath, any chest pain, increased weight. I did suggest the facility do daily weights of patient until he comes to see [place]. They will need this order sent over, I will fax it today.  Spoke with MD, agreed with plan.  Called son back and advised MD okay with this plan. Son verbalized understanding, thankful for call back.

## 2020-05-20 NOTE — Telephone Encounter (Signed)
ECHO scheduled at [facility] office tomorrow at 9:00 AM.  Patient son aware and will have his dad there tomorrow, he also is aware of chest x-ray order and will take him as a walk-in to have it completed.   Thankful for the call back and response from our office.

## 2020-05-20 NOTE — Telephone Encounter (Signed)
Agree. See if you can get echo and chest x-ray before he sees me.   Gerri Spore T. Flora Lipps, MD, [facility]  [facility]  [address] [place], [place] [address] [phone]  2:47 PM

## 2020-05-21 ENCOUNTER — Telehealth: Payer: Self-pay | Admitting: Cardiovascular Disease

## 2020-05-21 ENCOUNTER — Ambulatory Visit
Admission: RE | Admit: 2020-05-21 | Discharge: 2020-05-21 | Disposition: A | Discharge status: Home / Self Care | Payer: Medicare HMO | Source: Ambulatory Visit | Attending: Cardiovascular Disease | Admitting: Cardiovascular Disease

## 2020-05-21 ENCOUNTER — Inpatient Hospital Stay (HOSPITAL_COMMUNITY)
Admission: EM | Admit: 2020-05-21 | Discharge: 2020-05-24 | DRG: 189 | Disposition: A | Discharge status: Home / Self Care | Payer: Medicare HMO | Attending: Cardiology | Admitting: Cardiology

## 2020-05-21 ENCOUNTER — Other Ambulatory Visit: Payer: Self-pay

## 2020-05-21 ENCOUNTER — Ambulatory Visit (INDEPENDENT_AMBULATORY_CARE_PROVIDER_SITE_OTHER): Payer: Medicare HMO

## 2020-05-21 ENCOUNTER — Telehealth: Payer: Self-pay | Admitting: *Deleted

## 2020-05-21 DIAGNOSIS — I5031 Acute diastolic (congestive) heart failure: Secondary | ICD-10-CM | POA: Diagnosis not present

## 2020-05-21 DIAGNOSIS — E871 Hypo-osmolality and hyponatremia: Secondary | ICD-10-CM | POA: Diagnosis present

## 2020-05-21 DIAGNOSIS — Z20822 Contact with and (suspected) exposure to covid-19: Secondary | ICD-10-CM | POA: Diagnosis present

## 2020-05-21 DIAGNOSIS — I509 Heart failure, unspecified: Secondary | ICD-10-CM | POA: Diagnosis present

## 2020-05-21 DIAGNOSIS — Z91011 Allergy to milk products: Secondary | ICD-10-CM | POA: Diagnosis not present

## 2020-05-21 DIAGNOSIS — Z66 Do not resuscitate: Secondary | ICD-10-CM | POA: Diagnosis present

## 2020-05-21 DIAGNOSIS — I110 Hypertensive heart disease with heart failure: Secondary | ICD-10-CM | POA: Diagnosis present

## 2020-05-21 DIAGNOSIS — I340 Nonrheumatic mitral (valve) insufficiency: Secondary | ICD-10-CM

## 2020-05-21 DIAGNOSIS — I493 Ventricular premature depolarization: Secondary | ICD-10-CM | POA: Diagnosis present

## 2020-05-21 DIAGNOSIS — Z79899 Other long term (current) drug therapy: Secondary | ICD-10-CM | POA: Diagnosis not present

## 2020-05-21 DIAGNOSIS — J9600 Acute respiratory failure, unspecified whether with hypoxia or hypercapnia: Secondary | ICD-10-CM | POA: Diagnosis not present

## 2020-05-21 DIAGNOSIS — R0602 Shortness of breath: Secondary | ICD-10-CM | POA: Diagnosis not present

## 2020-05-21 DIAGNOSIS — I272 Pulmonary hypertension, unspecified: Secondary | ICD-10-CM | POA: Diagnosis present

## 2020-05-21 DIAGNOSIS — I361 Nonrheumatic tricuspid (valve) insufficiency: Secondary | ICD-10-CM | POA: Diagnosis not present

## 2020-05-21 DIAGNOSIS — J9601 Acute respiratory failure with hypoxia: Principal | ICD-10-CM | POA: Diagnosis present

## 2020-05-21 DIAGNOSIS — I619 Nontraumatic intracerebral hemorrhage, unspecified: Secondary | ICD-10-CM

## 2020-05-21 DIAGNOSIS — K219 Gastro-esophageal reflux disease without esophagitis: Secondary | ICD-10-CM | POA: Diagnosis present

## 2020-05-21 DIAGNOSIS — Z7982 Long term (current) use of aspirin: Secondary | ICD-10-CM

## 2020-05-21 DIAGNOSIS — R778 Other specified abnormalities of plasma proteins: Secondary | ICD-10-CM

## 2020-05-21 DIAGNOSIS — G4733 Obstructive sleep apnea (adult) (pediatric): Secondary | ICD-10-CM | POA: Diagnosis present

## 2020-05-21 DIAGNOSIS — I959 Hypotension, unspecified: Secondary | ICD-10-CM | POA: Diagnosis present

## 2020-05-21 DIAGNOSIS — I5032 Chronic diastolic (congestive) heart failure: Secondary | ICD-10-CM

## 2020-05-21 DIAGNOSIS — I4819 Other persistent atrial fibrillation: Secondary | ICD-10-CM

## 2020-05-21 DIAGNOSIS — I5033 Acute on chronic diastolic (congestive) heart failure: Secondary | ICD-10-CM | POA: Diagnosis present

## 2020-05-21 LAB — I-STAT ARTERIAL BLOOD GAS, ED
Acid-base deficit: 4 mmol/L — ABNORMAL HIGH (ref 0.0–2.0)
Bicarbonate: 19.9 mmol/L — ABNORMAL LOW (ref 20.0–28.0)
Calcium, Ion: 1.2 mmol/L (ref 1.15–1.40)
HCT: 33 % — ABNORMAL LOW (ref 39.0–52.0)
Hemoglobin: 11.2 g/dL — ABNORMAL LOW (ref 13.0–17.0)
O2 Saturation: 98 %
Patient temperature: 97.6
Potassium: 4.3 mmol/L (ref 3.5–5.1)
Sodium: 123 mmol/L — ABNORMAL LOW (ref 135–145)
TCO2: 21 mmol/L — ABNORMAL LOW (ref 22–32)
pCO2 arterial: 30.2 mmHg — ABNORMAL LOW (ref 32.0–48.0)
pH, Arterial: 7.426 (ref 7.350–7.450)
pO2, Arterial: 94 mmHg (ref 83.0–108.0)

## 2020-05-21 LAB — BASIC METABOLIC PANEL
Anion gap: 8 (ref 5–15)
BUN: 23 mg/dL (ref 8–23)
CO2: 24 mmol/L (ref 22–32)
Calcium: 8.5 mg/dL — ABNORMAL LOW (ref 8.9–10.3)
Chloride: 92 mmol/L — ABNORMAL LOW (ref 98–111)
Creatinine, Ser: 0.95 mg/dL (ref 0.61–1.24)
GFR, Estimated: >60 mL/min (ref >60)
Glucose, Bld: 96 mg/dL (ref 70–99)
Potassium: 4.4 mmol/L (ref 3.5–5.1)
Sodium: 124 mmol/L — ABNORMAL LOW (ref 135–145)

## 2020-05-21 LAB — ECHOCARDIOGRAM COMPLETE
AR max vel: 1.86 cm2
AV Area VTI: 1.7 cm2
AV Area mean vel: 2.1 cm2
AV Mean grad: 5.7 mmHg
AV Peak grad: 13 mmHg
AV Vena cont: 0.4 cm
Ao pk vel: 1.8 m/s
Calc EF: 60.6 %
MV M vel: 4.96 m/s
MV Peak grad: 98.4 mmHg
P 1/2 time: 688 msec
Radius: 0.5 cm
S' Lateral: 2.56 cm
Single Plane A2C EF: 56.7 %
Single Plane A4C EF: 60.2 %

## 2020-05-21 LAB — CREATININE, SERUM
Creatinine, Ser: 0.89 mg/dL (ref 0.61–1.24)
GFR, Estimated: >60 mL/min (ref >60)

## 2020-05-21 LAB — MRSA PCR SCREENING: MRSA by PCR: NEGATIVE

## 2020-05-21 LAB — HEPATIC FUNCTION PANEL
ALT: 42 U/L (ref 0–44)
AST: 40 U/L (ref 15–41)
Albumin: 3.3 g/dL — ABNORMAL LOW (ref 3.5–5.0)
Alkaline Phosphatase: 128 U/L — ABNORMAL HIGH (ref 38–126)
Bilirubin, Direct: 0.3 mg/dL — ABNORMAL HIGH (ref 0.0–0.2)
Indirect Bilirubin: 0.5 mg/dL (ref 0.3–0.9)
Total Bilirubin: 0.8 mg/dL (ref 0.3–1.2)
Total Protein: 6.3 g/dL — ABNORMAL LOW (ref 6.5–8.1)

## 2020-05-21 LAB — CBC
HCT: 32.4 % — ABNORMAL LOW (ref 39.0–52.0)
HCT: 33.5 % — ABNORMAL LOW (ref 39.0–52.0)
Hemoglobin: 10.8 g/dL — ABNORMAL LOW (ref 13.0–17.0)
Hemoglobin: 11.5 g/dL — ABNORMAL LOW (ref 13.0–17.0)
MCH: 30.8 pg (ref 26.0–34.0)
MCH: 30.7 pg (ref 26.0–34.0)
MCHC: 33.3 g/dL (ref 30.0–36.0)
MCHC: 34.3 g/dL (ref 30.0–36.0)
MCV: 92.3 fL (ref 80.0–100.0)
MCV: 89.3 fL (ref 80.0–100.0)
Platelets: 370 10*3/uL (ref 150–400)
Platelets: 375 10*3/uL (ref 150–400)
RBC: 3.51 MIL/uL — ABNORMAL LOW (ref 4.22–5.81)
RBC: 3.75 MIL/uL — ABNORMAL LOW (ref 4.22–5.81)
RDW: 13.5 % (ref 11.5–15.5)
RDW: 13.3 % (ref 11.5–15.5)
WBC: 8.8 10*3/uL (ref 4.0–10.5)
WBC: 9.3 10*3/uL (ref 4.0–10.5)
nRBC: 0 % (ref 0.0–0.2)
nRBC: 0 % (ref 0.0–0.2)

## 2020-05-21 LAB — BRAIN NATRIURETIC PEPTIDE: B Natriuretic Peptide: 834.4 pg/mL — ABNORMAL HIGH (ref 0.0–100.0)

## 2020-05-21 LAB — RESP PANEL BY RT-PCR (FLU A&B, COVID) ARPGX2
Influenza A by PCR: NEGATIVE
Influenza B by PCR: NEGATIVE
SARS Coronavirus 2 by RT PCR: NEGATIVE

## 2020-05-21 LAB — TROPONIN I (HIGH SENSITIVITY)
Troponin I (High Sensitivity): 18 ng/L — ABNORMAL HIGH (ref <18)
Troponin I (High Sensitivity): 17 ng/L (ref <18)

## 2020-05-21 MED ORDER — PANTOPRAZOLE SODIUM 40 MG PO TBEC
40.0000 mg | DELAYED_RELEASE_TABLET | Freq: Every day | ORAL | Status: DC
  Administered 2020-05-22 – 2020-05-24 (×3): 40 mg via ORAL
  Filled 2020-05-21 (×3): qty 1

## 2020-05-21 MED ORDER — ASPIRIN EC 81 MG PO TBEC
81.0000 mg | DELAYED_RELEASE_TABLET | Freq: Every day | ORAL | Status: DC
  Administered 2020-05-22 – 2020-05-24 (×3): 81 mg via ORAL
  Filled 2020-05-21 (×3): qty 1

## 2020-05-21 MED ORDER — FUROSEMIDE 10 MG/ML IJ SOLN
80.0000 mg | Freq: Two times a day (BID) | INTRAMUSCULAR | Status: DC
  Administered 2020-05-21: 80 mg via INTRAVENOUS
  Administered 2020-05-22: 40 mg via INTRAVENOUS
  Filled 2020-05-21 (×2): qty 8

## 2020-05-21 MED ORDER — ASPIRIN 300 MG RE SUPP: 300.0000 mg | RECTAL | Status: AC

## 2020-05-21 MED ORDER — ASPIRIN 81 MG PO TBEC: 81.0000 mg | DELAYED_RELEASE_TABLET | Freq: Every day | ORAL | Status: DC

## 2020-05-21 MED ORDER — ONDANSETRON HCL 4 MG/2ML IJ SOLN: 4.0000 mg | Freq: Four times a day (QID) | INTRAMUSCULAR | Status: DC | PRN

## 2020-05-21 MED ORDER — HEPARIN SODIUM (PORCINE) 5000 UNIT/ML IJ SOLN
5000.0000 [IU] | Freq: Three times a day (TID) | INTRAMUSCULAR | Status: DC
  Administered 2020-05-21 – 2020-05-22 (×2): 5000 [IU] via SUBCUTANEOUS
  Filled 2020-05-21 (×3): qty 1

## 2020-05-21 MED ORDER — CHLORHEXIDINE GLUCONATE CLOTH 2 % EX PADS
6.0000 | MEDICATED_PAD | Freq: Every day | CUTANEOUS | Status: DC
  Administered 2020-05-21 – 2020-05-23 (×3): 6 via TOPICAL

## 2020-05-21 MED ORDER — ASPIRIN 81 MG PO CHEW
324.0000 mg | CHEWABLE_TABLET | ORAL | Status: AC
  Administered 2020-05-21: 324 mg via ORAL
  Filled 2020-05-21: qty 4

## 2020-05-21 MED ORDER — NITROGLYCERIN 0.4 MG SL SUBL: 0.4000 mg | SUBLINGUAL_TABLET | SUBLINGUAL | Status: DC | PRN

## 2020-05-21 MED ORDER — SERTRALINE HCL 50 MG PO TABS
50.0000 mg | ORAL_TABLET | Freq: Every day | ORAL | Status: DC
  Administered 2020-05-22: 25 mg via ORAL
  Filled 2020-05-21: qty 1

## 2020-05-21 MED ORDER — ALBUTEROL SULFATE HFA 108 (90 BASE) MCG/ACT IN AERS
2.0000 | INHALATION_SPRAY | Freq: Once | RESPIRATORY_TRACT | Status: AC
  Administered 2020-05-21: 2 via RESPIRATORY_TRACT
  Filled 2020-05-21: qty 6.7

## 2020-05-21 MED ORDER — ACETAMINOPHEN 325 MG PO TABS
650.0000 mg | ORAL_TABLET | ORAL | Status: DC | PRN
  Administered 2020-05-23 – 2020-05-24 (×2): 650 mg via ORAL
  Filled 2020-05-21 (×2): qty 2

## 2020-05-21 NOTE — Telephone Encounter (Signed)
Called Mr. Pruss's son, Gerlene Burdock.  Chest x-ray shows right upper lobe infiltrate.  Echocardiogram shows severe mitral regurgitation with an anteriorly directed jet.  I did review my office notes from 04/23/2020.  He had no murmur suggestive of mitral regurgitation.  He also had an echocardiogram in [place] in September 2021.  There was mention of mild MR.  His echocardiogram clearly is changed.   He had a sudden acute change in his activity level.  There is also cough and shortness of breath.  I fear he likely has a flail or ruptured cord of the posterior mitral valve leaflet that has resulted in severe mitral regurgitation.  The right upper lobe infiltrate is just likely his MR.  I have advised him to come to the emergency room for further work-up.  We need to exclude pneumonia and likely work-up his mitral valve regurgitation.  I did express his son I understand he is DNR.  However, at this time we do need to work him up to sort out what is going on.   Given his profound symptoms who instructed to come to the emergency room.  He will do that today.  Gerri Spore T. Flora Lipps, MD, [facility]  [facility]  [address] [place], [place] [address] [phone]  12:22 PM

## 2020-05-21 NOTE — Telephone Encounter (Signed)
After reviewing his echocardiogram I fear he has acute severe mitral regurgitation.  I did call the son and informed him to come to the emergency room.  We need to exclude pneumonia and likely work him up for congestive heart failure which is due to mitral regurgitation.  The son was understanding and they will come to the emergency room today.  Gerri Spore T. Flora Lipps, MD, [facility]  [facility]  [address] [place], [place] [address] [phone]  12:23 PM

## 2020-05-21 NOTE — H&P (Addendum)
Cardiology H&P:   Patient ID: Adrian [name] MRN: [id number]; DOB: [birth year]  Admit date: 05/21/2020 Date of Consult: 05/21/2020  PCP:  Almetta Lovely, [facility]  Cardiologist:  Reatha Harps, MD  Advanced Practice Provider:  No care team member to display Electrophysiologist:  None  6} Patient Profile:   Adrian [name] is a 87 y.o. male with a hx of persistent AF, HTN, ICH, GERD and OSA who is being seen today for the evaluation of abnormal echo/dyspnea at the request of Dr. Criss Alvine.  History of Present Illness:   Adrian [name] is a 87 yo male with PMH noted above. He recently relocated from [facility] to [place] last summer. Reported moving closer to be near his daughter after his wife passed away. He is a retired Art gallery manager. Previously followed through [facility]. Had a stress test and echocardiogram back 11/2010 which was normal. Wore a monitor which showed 1% A.fib burden.   Had a near syncopal episode back in at the beginning of February with notable hypotension and seen in the ED. Felt to be related to poor PO intake in the setting of Afib RVR. Was seen in the office the following day and noted still in Afib but rate controlled at 84. Unable to be on anticoagulation 2/2 ICH while on coumadin 16 years ago. The patient also expressed no desire at this office visit to resume. He was started on metoprolol 12.5mg  BID for rate control and instructed to maintain adequate hydration.   Presented back to the office on 2/24  By Oris Drone, NP with increasing fatigue over the past 2 weeks. Said he had increased his PO intake and blood pressures had been better along with HR. His metoprolol was stopped with concern it was contributing to his fatigue. No complaints of chest pain or dyspnea.  Family called the office 3/9 stating he was seen by his PCP and needed an appt as soon as possible with cardiology. Attempted to move up his office appt. Did  have a CXR and echo completed today. CXR showed right upper lobe infiltrate. Echo demonstrated severe mitral regurgitation, concern for ruptured cord of posterior mitral valve leaflet. He was called by Dr. Flora Lipps and instructed to come to the ED for further management.   In the ED his labs showed Na+ 124, K+ 4.4, Cr 0.95, WBC 8.8, Hgb 10.8, TSH 4.19. EKG showed Afib 88bpm with PVC.    Past Medical History:  Diagnosis Date  . Arrhythmia   . GERD (gastroesophageal reflux disease)   . Intracranial hemorrhage (HCC)   . OSA (obstructive sleep apnea)     Past Surgical History:  Procedure Laterality Date  . CRANIOTOMY    . SHOULDER SURGERY       Home Medications:  Prior to Admission medications   Medication Sig Start Date End Date Taking? Authorizing Provider  acetaminophen (TYLENOL) 500 MG tablet Take by mouth 4 (four) times daily.    [provider]  aspirin 81 MG EC tablet Take by mouth.    [provider]  Calcium Carb-Cholecalciferol (CALCIUM 500 + D3) 500-200 MG-UNIT TABS 1 tablet with meals    [provider]  cetirizine (ZYRTEC) 10 MG tablet Take by mouth.    [provider]  esomeprazole (NEXIUM) 20 MG capsule 1 capsule    [provider]  guaifenesin (HUMIBID E) 400 MG TABS tablet Take by mouth.    [provider]  montelukast (SINGULAIR) 10 MG  tablet 1 tablet 12/02/19   [provider]  Multiple Vitamin (MULTIVITAMIN) tablet Take 1 tablet by mouth daily.    [provider]  Multiple Vitamins-Minerals (PRESERVISION AREDS PO) Take by mouth.    [provider]  rivastigmine (EXELON) 3 MG capsule Take 3 mg by mouth 2 (two) times daily. 04/11/20   [provider]  Saw Palmetto 500 MG CAPS Take by mouth.    [provider]  sertraline (ZOLOFT) 50 MG tablet Take 50 mg by mouth daily. 03/31/20   [provider]    Inpatient Medications: Scheduled Meds:  Continuous  Infusions:  PRN Meds:   Allergies:   No Known Allergies  Social History:   Social History   Socioeconomic History  . Marital status: Widowed    Spouse name: Not on file  . Number of children: 7  . Years of education: Not on file  . Highest education level: Not on file  Occupational History  . Occupation: retired Immunologist  Tobacco Use  . Smoking status: Never Smoker  . Smokeless tobacco: Never Used  Substance and Sexual Activity  . Alcohol use: Never  . Drug use: Never  . Sexual activity: Not on file  Other Topics Concern  . Not on file  Social History Narrative  . Not on file   Social Determinants of Health   Financial Resource Strain: Not on file  Food Insecurity: Not on file  Transportation Needs: Not on file  Physical Activity: Not on file  Stress: Not on file  Social Connections: Not on file  Intimate Partner Violence: Not on file    Family History:    Family History  Problem Relation Age of Onset  . Cancer Father      ROS:  Please see the history of present illness.   All other ROS reviewed and negative.     Physical Exam/Data:   Vitals:   05/21/20 1342 05/21/20 1447 05/21/20 1600 05/21/20 1609  BP: (!) 140/93 117/81 138/88   Pulse: 86 83 85   Resp: (!) 28 (!) 27 (!) 30   Temp: 98 F (36.7 C)   97.6 F (36.4 C)  TempSrc:    Oral  SpO2: 98% 97% 93%    No intake or output data in the 24 hours ending 05/21/20 1641 Last 3 Weights 05/07/2020 04/23/2020 04/22/2020  Weight (lbs) 162 lb 3.2 oz 152 lb 150 lb  Weight (kg) 73.573 kg 68.947 kg 68.04 kg     There is no height or weight on file to calculate BMI.   Physical Exam per MD below  EKG:  The EKG was personally reviewed and demonstrates:  Afib 88 bpm, PVC  Relevant CV Studies:  Echo: 05/21/20  IMPRESSIONS    1. Left ventricular ejection fraction, by estimation, is 55 to 60%. The  left ventricle has normal function. The left ventricle has no regional  wall motion abnormalities. There  is moderate left ventricular hypertrophy.  Left ventricular diastolic  parameters are indeterminate.  2. Right ventricular systolic function is mildly reduced. The right  ventricular size is moderately enlarged.  3. Left atrial size was mildly dilated.  4. Right atrial size was mildly dilated.  5. Moderate pleural effusion in the left lateral region.  6. Prolapse of the posterior MV leaflet with very eccentric anteriorally  directed MR. The eccentricity of the jet may lead to underestimation of  MR. The MV/AV VTI ratio is around 1 suggesting moderate MR. . The mitral  valve is abnormal. Moderate mitral  valve regurgitation. No evidence of mitral stenosis.  7. Tricuspid valve regurgitation is mild to moderate.  8. The aortic valve is tricuspid. There is moderate calcification of the  aortic valve. There is moderate thickening of the aortic valve. Aortic  valve regurgitation is mild.  9. Moderate pulmonary HTN, PASP is 57 mmHg.  10. The inferior vena cava is dilated in size with <50% respiratory  variability, suggesting right atrial pressure of 15 mmHg.   Comparison(s): Prior Echo at [facility] showed LV EF 60-65%, no RWMA, mild  LVH, mild LAE, tricuspid AoV with mild AI, mild MR.   Laboratory Data:  High Sensitivity Troponin:  No results for input(s): TROPONINIHS in the last 720 hours.   Chemistry Recent Labs  Lab 05/21/20 1350  NA 124*  K 4.4  CL 92*  CO2 24  GLUCOSE 96  BUN 23  CREATININE 0.95  CALCIUM 8.5*  GFRNONAA >60  ANIONGAP 8    No results for input(s): PROT, ALBUMIN, AST, ALT, ALKPHOS, BILITOT in the last 168 hours. Hematology Recent Labs  Lab 05/21/20 1350  WBC 8.8  RBC 3.51*  HGB 10.8*  HCT 32.4*  MCV 92.3  MCH 30.8  MCHC 33.3  RDW 13.5  PLT 370   BNPNo results for input(s): BNP, PROBNP in the last 168 hours.  DDimer No results for input(s): DDIMER in the last 168 hours.   Radiology/Studies:  DG Chest 2 View  Result Date:  05/21/2020 CLINICAL DATA:  Shortness of breath and fatigue EXAM: CHEST - 2 VIEW COMPARISON:  None. FINDINGS: There is airspace opacity in the anterior segment right upper lobe consistent with pneumonia. There is consolidation in the posteromedial left base consistent with a second focus of pneumonia. The lungs elsewhere are clear. Heart is upper normal in size with pulmonary vascularity within normal limits. No adenopathy. There is aortic atherosclerosis. Postoperative change noted in the right shoulder. There is degenerative change in each shoulder with superior migration of each humeral head. IMPRESSION: Areas of apparent pneumonia in the anterior segment right upper lobe and in the posteromedial left base region. Heart size within normal limits. No adenopathy. Aortic Atherosclerosis (ICD10-I70.0). Suspect chronic rotator cuff tears bilaterally. These results will be called to the ordering clinician or representative by the Radiologist Assistant, and communication documented in the PACS or [employer]. Electronically Signed   By: Bretta Bang III M.D.   On: 05/21/2020 11:17   ECHOCARDIOGRAM COMPLETE  Result Date: 05/21/2020    ECHOCARDIOGRAM REPORT   Patient Name:   Adrian [name] Date of Exam: 05/21/2020 Medical Rec #:  [id number]      Height:       64.0 in Accession #:    [id number]     Weight:       162.2 lb Date of Birth:  [birth year]      BSA:          1.790 m Patient Age:    89 years       BP:           132/74 mmHg Patient Gender: M              HR:           73 bpm. Exam Location:  [place] Procedure: 2D Echo, Cardiac Doppler and Color Doppler Indications:    R06.02 SOB; R06.9 DOE; R53.83 Fatigue  History:        Patient has no prior history of Echocardiogram examinations and  Patient has prior history of Echocardiogram examinations, most                 recent 11/19/2019. History of intracranial hemorrhage.,                 Arrythmias:Atrial Fibrillation, Signs/Symptoms:Shortness of                  Breath and Dyspnea; Risk Factors:Hypertension. 04/22/20 ER                 visit, he was found to be in A-fib with RVR and hypotensive. It                 was felt that he was dehydrated.                 SOB has been steadily worsening.  Sonographer:    Vella Kohler BS, RVT, RDCS Referring Phys: [phone] Ronnald Ramp [name] IMPRESSIONS  1. Left ventricular ejection fraction, by estimation, is 55 to 60%. The left ventricle has normal function. The left ventricle has no regional wall motion abnormalities. There is moderate left ventricular hypertrophy. Left ventricular diastolic parameters are indeterminate.  2. Right ventricular systolic function is mildly reduced. The right ventricular size is moderately enlarged.  3. Left atrial size was mildly dilated.  4. Right atrial size was mildly dilated.  5. Moderate pleural effusion in the left lateral region.  6. 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 The eccentricity of the jet may lead to underestimation of MR. The MV/AV VTI ratio is around 1 suggesting moderate MR. . The mitral valve is abnormal. Moderate mitral valve regurgitation. No evidence of mitral stenosis.  7. Tricuspid valve regurgitation is mild to moderate.  8. The aortic valve is tricuspid. There is moderate calcification of the aortic valve. There is moderate thickening of the aortic valve. Aortic valve regurgitation is mild.  9. Moderate pulmonary HTN, PASP is 57 mmHg. 10. The inferior vena cava is dilated in size with <50% respiratory variability, suggesting right atrial pressure of 15 mmHg. Comparison(s): Prior Echo at [facility] showed LV EF 60-65%, no RWMA, mild LVH, mild LAE, tricuspid AoV with mild AI, mild MR. FINDINGS  Left Ventricle: LVOT VTI appears underestimated. Left ventricular ejection fraction, by estimation, is 55 to 60%. The left ventricle has normal function. The left ventricle has no regional wall motion abnormalities. Global  longitudinal strain performed but not reported based on interpreter judgement due to suboptimal tracking. The left ventricular internal cavity size was normal in size. There is moderate left ventricular hypertrophy. Left ventricular diastolic parameters are indeterminate. Right Ventricle: The right ventricular size is moderately enlarged. No increase in right ventricular wall thickness. Right ventricular systolic function is mildly reduced. Left Atrium: Left atrial size was mildly dilated. Right Atrium: Right atrial size was mildly dilated. Pericardium: There is no evidence of pericardial effusion. 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 The eccentricity of the jet may lead to underestimation of MR. The MV/AV VTI ratio is around 1 suggesting moderate MR. The mitral valve is abnormal. Moderate  mitral valve regurgitation. No evidence of mitral valve stenosis. Tricuspid Valve: The tricuspid valve is normal in structure. Tricuspid valve regurgitation is mild to moderate. No evidence of tricuspid stenosis. Aortic Valve: The aortic valve is tricuspid. There is moderate calcification of the aortic valve. There is moderate thickening of the aortic valve. There is moderate aortic valve  annular calcification. Aortic valve regurgitation is mild. Aortic regurgitation PHT measures 688 msec. Aortic valve mean gradient measures 5.7 mmHg. Aortic valve peak gradient measures 13.0 mmHg. Aortic valve area, by VTI measures 1.70 cm. Pulmonic Valve: The pulmonic valve was not well visualized. Pulmonic valve regurgitation is not visualized. No evidence of pulmonic stenosis. Aorta: The aortic root is normal in size and structure. Pulmonary Artery: Moderate pulmonary HTN, PASP is 57 mmHg. Venous: The inferior vena cava is dilated in size with less than 50% respiratory variability, suggesting right atrial pressure of 15 mmHg. IAS/Shunts: The interatrial septum was not well  visualized. Additional Comments: There is a moderate pleural effusion in the left lateral region.  LEFT VENTRICLE PLAX 2D LVIDd:         3.49 cm     Diastology LVIDs:         2.56 cm     LV e' medial:    8.92 cm/s LV PW:         1.43 cm     LV E/e' medial:  19.2 LV IVS:        1.56 cm     LV e' lateral:   9.14 cm/s LVOT diam:     2.30 cm     LV E/e' lateral: 18.7 LV SV:         52 LV SV Index:   29 LVOT Area:     4.15 cm  LV Volumes (MOD) LV vol d, MOD A2C: 72.1 ml LV vol d, MOD A4C: 64.3 ml LV vol s, MOD A2C: 31.2 ml LV vol s, MOD A4C: 25.6 ml LV SV MOD A2C:     40.9 ml LV SV MOD A4C:     64.3 ml LV SV MOD BP:      43.4 ml RIGHT VENTRICLE RV Basal diam:  3.98 cm RV Mid diam:    2.62 cm RV S prime:     14.70 cm/s TAPSE (M-mode): 1.5 cm LEFT ATRIUM             Index       RIGHT ATRIUM           Index LA diam:        4.60 cm 2.57 cm/m  RA Area:     19.30 cm LA Vol (A2C):   63.6 ml 35.54 ml/m RA Volume:   51.80 ml  28.94 ml/m LA Vol (A4C):   51.5 ml 28.78 ml/m LA Biplane Vol: 58.1 ml 32.46 ml/m  AORTIC VALVE                    PULMONIC VALVE AV Area (Vmax):    1.86 cm     PV Vmax:       1.07 m/s AV Area (Vmean):   2.10 cm     PV Peak grad:  4.6 mmHg AV Area (VTI):     1.70 cm AV Vmax:           180.16 cm/s AV Vmean:          110.806 cm/s AV VTI:            0.308 m AV Peak Grad:      13.0 mmHg AV Mean Grad:      5.7 mmHg LVOT Vmax:         80.60 cm/s LVOT Vmean:        56.100 cm/s LVOT VTI:          0.126 m LVOT/AV VTI  ratio: 0.41 AI PHT:            688 msec AR Vena Contracta: 0.40 cm  AORTA Ao Root diam: 3.70 cm Ao Asc diam:  3.40 cm MR Peak grad:    98.4 mmHg   TRICUSPID VALVE MR Mean grad:    66.0 mmHg   TR Peak grad:   42.5 mmHg MR Vmax:         496.00 cm/s TR Vmax:        326.00 cm/s MR Vmean:        387.0 cm/s MR PISA:         1.57 cm    SHUNTS MR PISA Eff ROA: 7 mm       Systemic VTI:  0.13 m MR PISA Radius:  0.50 cm     Systemic Diam: 2.30 cm MV E velocity: 171.00 cm/s MV A velocity: 35.80 cm/s MV  E/A ratio:  4.78 Dina Rich MD Electronically signed by Dina Rich MD Signature Date/Time: 05/21/2020/10:45:05 AM    Final    Assessment and Plan:   Adrian [name] is a 87 y.o. male with a hx of persistent AF, HTN, ICH, GERD and OSA who is being seen today for the evaluation of abnormal echo/dyspnea at the request of Dr. Criss Alvine.  1. Acute respiratory failure in the setting of severe MR with suspected rupture/flail leaflet: patient has been experiencing worsening fatigue and dyspnea over the past couple of weeks, acutely worse over the past couple of days. CXR today with concern for PNA but suspect more likely edema in the setting of severe MR. He is afebrile and no leukocytosis  -- will admit to ICU for close monitoring as patient is tenuous  -- Ordered for IV lasix  BID -- holding home BP meds with concern for clinical decline -- has been placed on the schedule for TEE tomorrow pending stable from a respiratory standpoint -- will check ABG, BNP is pending -- NPO at midnight   2. Persistent Afib: hx of the same. Focus has been on rate control as he has not been a candidate for OAC with hx of ICH while on coumadin -- rate stable at this time  3. HTN: holding home BP meds as above with tenuous state  Patient CODE status was discussed with family/patient per Dr. Anne Fu. He has been a  DNR but does request to be intubated in the setting of respiratory decline. Would not want prolonged life saving measures. He will be admitted to [facility] for close monitoring. See MD note to follow  For questions or updates, please contact [facility] Please consult [email or web address] for contact info under    Signed, Laverda Page, NP  05/21/2020 4:41 PM  Personally seen and examined. Agree with above.   87 year old male with severe MR, flail leaflet, pulmonary edema, acute diastolic HF, acute respiratory failure.  Lungs: Crackles bilaterally, increased resp effort, RRR with 2/6 HSM lateral  apical. 1+ LE edema  Na 124, Creat 0.95, BNP 834, Trop 18  Plan:   --IV lasix 80 IV BID now --ABG - 7.4, 30, 94 --2H - ICU status --TEE tomorrow - ? Possible MitraClip --Discussed code status -- wife recently died and he is ready if it is his time. His son was present for discussion. When asked about intubation, vent, he would be amenable to short term intubation if we were able to address a potential reversible issue. No long term intubation. No CPR, no shocks.  --Discussed with Dr.  O'Neal as well, primary cards.  CRITICAL CARE Performed by: Donato SchultzMark Clarence Cogswell   Total critical care time: 55 minutes  Critical care time was exclusive of separately billable procedures and treating other patients.  Critical care was necessary to treat or prevent imminent or life-threatening deterioration.  Critical care was time spent personally by me on the following activities: development of treatment plan with patient and/or surrogate as well as nursing, discussions with consultants, evaluation of patient's response to treatment, examination of patient, obtaining history from patient or surrogate, ordering and performing treatments and interventions, ordering and review of laboratory studies, ordering and review of radiographic studies, pulse oximetry and re-evaluation of patient's condition.  Donato SchultzMark Juel Bellerose, MD

## 2020-05-21 NOTE — Telephone Encounter (Signed)
Please have the patient call the PCP to follow-up on this. This needs to be addressed today.   Gerri Spore T. Flora Lipps, MD, [facility]  [facility]  [address] [place], [place] [address] [phone]  12:01 PM

## 2020-05-21 NOTE — ED Provider Notes (Signed)
[facility] EMERGENCY DEPARTMENT Provider Note   CSN: [id number] Arrival date & time: 05/21/20  1331    History Chief Complaint  Patient presents with  . Shortness of Breath    Adrian [name] is a 87 y.o. male with past medical history significant for intermittent A. Fib who presents for evaluation of shortness patient. States feeling very fatigued.  Initially had a nonproductive cough which is now productive of white sputum.  Feels short of breath.  He denies any chest pain.  No hemoptysis.  He denies any PND orthopnea.  Initially seen by PCP who thought his shortness of breath was related to his heart. Called Cardiology. Dr. Coral Else. Ordered Chest xray and ECHO which were performed today. 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  Patient does not think he has a history of heart failure.  No fever, chills, nausea, vomiting, headache, lightheadedness, dizziness, vision changes abdominal pain, diarrhea, dysuria.  He is unsure of his last bowel movement.  Last urinated this morning.  Apparently had intermittent hypoxia in triage and was subsequently placed on 2 L.  Denies any current pain. Sleeps with 2 pillows at night.  History obtained from patient, son in room and past medical records.  No interpreter used.  HPI     Past Medical History:  Diagnosis Date  . Arrhythmia   . GERD (gastroesophageal reflux disease)   . Intracranial hemorrhage (HCC)   . OSA (obstructive sleep apnea)     Patient Active Problem List   Diagnosis Date Noted  . CHF (congestive heart failure) (HCC) 05/21/2020    Past Surgical History:  Procedure Laterality Date  . CRANIOTOMY    . SHOULDER SURGERY         Family History   Problem Relation Age of Onset  . Cancer Father     Social History   Tobacco Use  . Smoking status: Never Smoker  . Smokeless tobacco: Never Used  Substance Use Topics  . Alcohol use: Never  . Drug use: Never    Home Medications Prior to Admission medications   Medication Sig Start Date End Date Taking? Authorizing Provider  acetaminophen (TYLENOL) 500 MG tablet Take 500 mg by mouth in the morning, at noon, and at bedtime.   Yes [provider]  aspirin 81 MG EC tablet Take 81 mg by mouth daily.   Yes [provider]  Calcium Carb-Cholecalciferol (CALCIUM 500 + D3) 500-200 MG-UNIT TABS Take 1 tablet by mouth in the morning and at bedtime.   Yes [provider]  esomeprazole (NEXIUM) 20 MG capsule Take 20 mg by mouth daily.   Yes [provider]  fluticasone (FLONASE) 50 MCG/ACT nasal spray Place 1 spray into both nostrils in the morning and at bedtime.   Yes [provider]  guaifenesin (HUMIBID E) 400 MG TABS tablet Take by mouth.   Yes [provider]  montelukast (SINGULAIR) 10 MG tablet Take 10 mg by mouth at bedtime. 12/02/19  Yes [provider]  Multiple Vitamin (MULTIVITAMIN) tablet Take 1 tablet by mouth daily.   Yes [provider]  Multiple Vitamins-Minerals (PRESERVISION AREDS PO) Take by mouth.   Yes [provider]  rivastigmine (EXELON) 3 MG capsule Take 3 mg  by mouth 2 (two) times daily. 04/11/20  Yes [provider]  Saw Palmetto 500 MG CAPS Take by mouth.   Yes [provider]  sertraline (ZOLOFT) 50 MG tablet Take 25 mg by mouth daily. 03/31/20  Yes [provider]    Allergies    Milk protein  Review of Systems   Review of Systems  Constitutional: Positive for activity change. Negative for appetite change.  HENT: Negative.   Respiratory: Positive for cough and shortness of breath.   Cardiovascular: Negative.   Gastrointestinal: Negative.    Genitourinary: Negative.   Musculoskeletal: Positive for myalgias. Negative for arthralgias, back pain, gait problem, joint swelling, neck pain and neck stiffness.  Skin: Negative.   Neurological: Positive for weakness (Generalized). Negative for dizziness, tremors, seizures, syncope, facial asymmetry, light-headedness, numbness and headaches.  All other systems reviewed and are negative.   Physical Exam Updated Vital Signs BP (!) 133/92   Pulse (!) 122   Temp 97.7 F (36.5 C) (Oral)   Resp (!) 21   SpO2 93%   Physical Exam Vitals and nursing note reviewed.  Constitutional:      General: He is not in acute distress.    Appearance: He is ill-appearing. He is not toxic-appearing or diaphoretic.  HENT:     Head: Normocephalic and atraumatic.     Mouth/Throat:     Pharynx: Oropharynx is clear.  Eyes:     Pupils: Pupils are equal, round, and reactive to light.  Cardiovascular:     Rate and Rhythm: Normal rate and regular rhythm.     Pulses: Normal pulses.  Pulmonary:     Effort: Pulmonary effort is normal. No respiratory distress.     Breath sounds: Wheezing and rhonchi present.     Comments: Speaks in short sentences.  On 2 L via nasal cannula.  Coarse lung sounds bilaterally.  Mild wheeze expiratory. Chest:     Comments: Equal rise and fall to chest wall Abdominal:     General: Bowel sounds are normal. There is no distension.     Palpations: Abdomen is soft. There is no mass.     Tenderness: There is no abdominal tenderness. There is no guarding or rebound.     Comments: Soft, nontender.  Mildly distended.  Musculoskeletal:        General: Normal range of motion.     Cervical back: Normal range of motion and neck supple.     Right lower leg: No tenderness. No edema.     Left lower leg: No tenderness. No edema.     Comments: Moves all 4 extremities that difficulty.  No bony tenderness.  Compartments soft  Skin:    General: Skin is warm and dry.     Capillary Refill:  Capillary refill takes less than 2 seconds.     Comments: No edema, erythema or warmth.  No fluctuance or induration  Neurological:     General: No focal deficit present.     Mental Status: He is alert and oriented to person, place, and time.     Comments: Cranial nerves II through grossly intact    ED Results / Procedures / Treatments   Labs (all labs ordered are listed, but only abnormal results are displayed) Labs Reviewed  BASIC METABOLIC PANEL - Abnormal; Notable for the following components:      Result Value   Sodium 124 (*)    Chloride 92 (*)    Calcium 8.5 (*)    All other  components within normal limits  CBC - Abnormal; Notable for the following components:   RBC 3.51 (*)    Hemoglobin 10.8 (*)    HCT 32.4 (*)    All other components within normal limits  BRAIN NATRIURETIC PEPTIDE - Abnormal; Notable for the following components:   B Natriuretic Peptide 834.4 (*)    All other components within normal limits  I-STAT ARTERIAL BLOOD GAS, ED - Abnormal; Notable for the following components:   pCO2 arterial 30.2 (*)    Bicarbonate 19.9 (*)    TCO2 21 (*)    Acid-base deficit 4.0 (*)    Sodium 123 (*)    HCT 33.0 (*)    Hemoglobin 11.2 (*)    All other components within normal limits  TROPONIN I (HIGH SENSITIVITY) - Abnormal; Notable for the following components:   Troponin I (High Sensitivity) 18 (*)    All other components within normal limits  RESP PANEL BY RT-PCR (FLU A&B, COVID) ARPGX2  MRSA PCR SCREENING  CBC  CREATININE, SERUM  BASIC METABOLIC PANEL  CBC  HEPATIC FUNCTION PANEL  TROPONIN I (HIGH SENSITIVITY)    EKG EKG Interpretation  Date/Time:  Thursday May 21 2020 13:45:59 EST Ventricular Rate:  88 PR Interval:    QRS Duration: 88 QT Interval:  376 QTC Calculation: 454 R Axis:   -95 Text Interpretation: Atrial fibrillation with premature ventricular or aberrantly conducted complexes Right superior axis deviation Septal infarct , age  undetermined Abnormal ECG When comapred to prior, similar appeance with new PVC. No STEMI Confirmed by Theda Belfastegeler, Chris ([id number]) on 05/21/2020 3:30:23 PM   Radiology DG Chest 2 View  Result Date: 05/21/2020 CLINICAL DATA:  Shortness of breath and fatigue EXAM: CHEST - 2 VIEW COMPARISON:  None. FINDINGS: There is airspace opacity in the anterior segment right upper lobe consistent with pneumonia. There is consolidation in the posteromedial left base consistent with a second focus of pneumonia. The lungs elsewhere are clear. Heart is upper normal in size with pulmonary vascularity within normal limits. No adenopathy. There is aortic atherosclerosis. Postoperative change noted in the right shoulder. There is degenerative change in each shoulder with superior migration of each humeral head. IMPRESSION: Areas of apparent pneumonia in the anterior segment right upper lobe and in the posteromedial left base region. Heart size within normal limits. No adenopathy. Aortic Atherosclerosis (ICD10-I70.0). Suspect chronic rotator cuff tears bilaterally. These results will be called to the ordering clinician or representative by the Radiologist Assistant, and communication documented in the PACS or [employer] Dashboard. Electronically Signed   By: Bretta BangWilliam  Woodruff III M.D.   On: 05/21/2020 11:17   ECHOCARDIOGRAM COMPLETE  Result Date: 05/21/2020    ECHOCARDIOGRAM REPORT   Patient Name:   Dena BilletHAROLD Vo Date of Exam: 05/21/2020 Medical Rec #:  [id number]      Height:       64.0 in Accession #:    [id number]     Weight:       162.2 lb Date of Birth:  11-22-1927      BSA:          1.790 m Patient Age:    89 years       BP:           132/74 mmHg Patient Gender: M              HR:           73 bpm. Exam Location:  [place] Procedure: 2D Echo, Cardiac Doppler and  Color Doppler Indications:    R06.02 SOB; R06.9 DOE; R53.83 Fatigue  History:        Patient has no prior history of Echocardiogram examinations and                 Patient has  prior history of Echocardiogram examinations, most                 recent 11/19/2019. History of intracranial hemorrhage.,                 Arrythmias:Atrial Fibrillation, Signs/Symptoms:Shortness of                 Breath and Dyspnea; Risk Factors:Hypertension. 04/22/20 ER                 visit, he was found to be in A-fib with RVR and hypotensive. It                 was felt that he was dehydrated.                 SOB has been steadily worsening.  Sonographer:    Vella Kohler BS, RVT, RDCS Referring Phys: [phone] Ronnald Ramp O'NEAL IMPRESSIONS  1. Left ventricular ejection fraction, by estimation, is 55 to 60%. The left ventricle has normal function. The left ventricle has no regional wall motion abnormalities. There is moderate left ventricular hypertrophy. Left ventricular diastolic parameters are indeterminate.  2. Right ventricular systolic function is mildly reduced. The right ventricular size is moderately enlarged.  3. Left atrial size was mildly dilated.  4. Right atrial size was mildly dilated.  5. Moderate pleural effusion in the left lateral region.  6. 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 The eccentricity of the jet may lead to underestimation of MR. The MV/AV VTI ratio is around 1 suggesting moderate MR. . The mitral valve is abnormal. Moderate mitral valve regurgitation. No evidence of mitral stenosis.  7. Tricuspid valve regurgitation is mild to moderate.  8. The aortic valve is tricuspid. There is moderate calcification of the aortic valve. There is moderate thickening of the aortic valve. Aortic valve regurgitation is mild.  9. Moderate pulmonary HTN, PASP is 57 mmHg. 10. The inferior vena cava is dilated in size with <50% respiratory variability, suggesting right atrial pressure of 15 mmHg. Comparison(s): Prior Echo at [facility] showed LV EF 60-65%, no RWMA, mild LVH, mild LAE, tricuspid AoV with mild AI, mild MR. FINDINGS  Left Ventricle: LVOT VTI  appears underestimated. Left ventricular ejection fraction, by estimation, is 55 to 60%. The left ventricle has normal function. The left ventricle has no regional wall motion abnormalities. Global longitudinal strain performed but not reported based on interpreter judgement due to suboptimal tracking. The left ventricular internal cavity size was normal in size. There is moderate left ventricular hypertrophy. Left ventricular diastolic parameters are indeterminate. Right Ventricle: The right ventricular size is moderately enlarged. No increase in right ventricular wall thickness. Right ventricular systolic function is mildly reduced. Left Atrium: Left atrial size was mildly dilated. Right Atrium: Right atrial size was mildly dilated. Pericardium: There is no evidence of pericardial effusion. 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 The eccentricity of the jet may lead to underestimation of MR. The MV/AV VTI ratio is around 1 suggesting moderate MR. The mitral valve is abnormal. Moderate  mitral valve regurgitation. No evidence of mitral valve stenosis. Tricuspid Valve: The  tricuspid valve is normal in structure. Tricuspid valve regurgitation is mild to moderate. No evidence of tricuspid stenosis. Aortic Valve: The aortic valve is tricuspid. There is moderate calcification of the aortic valve. There is moderate thickening of the aortic valve. There is moderate aortic valve annular calcification. Aortic valve regurgitation is mild. Aortic regurgitation PHT measures 688 msec. Aortic valve mean gradient measures 5.7 mmHg. Aortic valve peak gradient measures 13.0 mmHg. Aortic valve area, by VTI measures 1.70 cm. Pulmonic Valve: The pulmonic valve was not well visualized. Pulmonic valve regurgitation is not visualized. No evidence of pulmonic stenosis. Aorta: The aortic root is normal in size and structure. Pulmonary Artery: Moderate pulmonary HTN, PASP is 57 mmHg.  Venous: The inferior vena cava is dilated in size with less than 50% respiratory variability, suggesting right atrial pressure of 15 mmHg. IAS/Shunts: The interatrial septum was not well visualized. Additional Comments: There is a moderate pleural effusion in the left lateral region.  LEFT VENTRICLE PLAX 2D LVIDd:         3.49 cm     Diastology LVIDs:         2.56 cm     LV e' medial:    8.92 cm/s LV PW:         1.43 cm     LV E/e' medial:  19.2 LV IVS:        1.56 cm     LV e' lateral:   9.14 cm/s LVOT diam:     2.30 cm     LV E/e' lateral: 18.7 LV SV:         52 LV SV Index:   29 LVOT Area:     4.15 cm  LV Volumes (MOD) LV vol d, MOD A2C: 72.1 ml LV vol d, MOD A4C: 64.3 ml LV vol s, MOD A2C: 31.2 ml LV vol s, MOD A4C: 25.6 ml LV SV MOD A2C:     40.9 ml LV SV MOD A4C:     64.3 ml LV SV MOD BP:      43.4 ml RIGHT VENTRICLE RV Basal diam:  3.98 cm RV Mid diam:    2.62 cm RV S prime:     14.70 cm/s TAPSE (M-mode): 1.5 cm LEFT ATRIUM             Index       RIGHT ATRIUM           Index LA diam:        4.60 cm 2.57 cm/m  RA Area:     19.30 cm LA Vol (A2C):   63.6 ml 35.54 ml/m RA Volume:   51.80 ml  28.94 ml/m LA Vol (A4C):   51.5 ml 28.78 ml/m LA Biplane Vol: 58.1 ml 32.46 ml/m  AORTIC VALVE                    PULMONIC VALVE AV Area (Vmax):    1.86 cm     PV Vmax:       1.07 m/s AV Area (Vmean):   2.10 cm     PV Peak grad:  4.6 mmHg AV Area (VTI):     1.70 cm AV Vmax:           180.16 cm/s AV Vmean:          110.806 cm/s AV VTI:            0.308 m AV Peak Grad:      13.0 mmHg AV Mean  Grad:      5.7 mmHg LVOT Vmax:         80.60 cm/s LVOT Vmean:        56.100 cm/s LVOT VTI:          0.126 m LVOT/AV VTI ratio: 0.41 AI PHT:            688 msec AR Vena Contracta: 0.40 cm  AORTA Ao Root diam: 3.70 cm Ao Asc diam:  3.40 cm MR Peak grad:    98.4 mmHg   TRICUSPID VALVE MR Mean grad:    66.0 mmHg   TR Peak grad:   42.5 mmHg MR Vmax:         496.00 cm/s TR Vmax:        326.00 cm/s MR Vmean:        387.0 cm/s MR PISA:          1.57 cm    SHUNTS MR PISA Eff ROA: 7 mm       Systemic VTI:  0.13 m MR PISA Radius:  0.50 cm     Systemic Diam: 2.30 cm MV E velocity: 171.00 cm/s MV A velocity: 35.80 cm/s MV E/A ratio:  4.78 Dina Rich MD Electronically signed by Dina Rich MD Signature Date/Time: 05/21/2020/10:45:05 AM    Final     Procedures .Critical Care Performed by: Linwood Dibbles, PA-C Authorized by: Linwood Dibbles, PA-C   Critical care provider statement:    Critical care time (minutes):  45   Critical care was necessary to treat or prevent imminent or life-threatening deterioration of the following conditions:  Cardiac failure and respiratory failure   Critical care was time spent personally by me on the following activities:  Discussions with consultants, evaluation of patient's response to treatment, examination of patient, ordering and performing treatments and interventions, ordering and review of laboratory studies, ordering and review of radiographic studies, pulse oximetry, re-evaluation of patient's condition, obtaining history from patient or surrogate and review of old charts     Medications Ordered in ED Medications  furosemide (LASIX) injection 80 mg (80 mg Intravenous Given 05/21/20 1820)  sertraline (ZOLOFT) tablet 50 mg (has no administration in time range)  pantoprazole (PROTONIX) EC tablet 40 mg (has no administration in time range)  aspirin chewable tablet 324 mg (has no administration in time range)    Or  aspirin suppository 300 mg (has no administration in time range)  aspirin EC tablet 81 mg (has no administration in time range)  nitroGLYCERIN (NITROSTAT) SL tablet 0.4 mg (has no administration in time range)  acetaminophen (TYLENOL) tablet 650 mg (has no administration in time range)  ondansetron (ZOFRAN) injection 4 mg (has no administration in time range)  heparin injection 5,000 Units (has no administration in time range)  Chlorhexidine Gluconate Cloth 2 % PADS  6 each (has no administration in time range)  albuterol (VENTOLIN HFA) 108 (90 Base) MCG/ACT inhaler 2 puff (2 puffs Inhalation Given 05/21/20 1620)    ED Course  I have reviewed the triage vital signs and the nursing notes.  Pertinent labs & imaging results that were available during my care of the patient were reviewed by me and considered in my medical decision making (see chart for details).  87 year old here for evaluation of shortness of breath and cough.  He is afebrile, nonseptic appearing however does appear ill.  He has coarse lung sounds bilaterally with mild diffuse.  He is on 2 L via nasal cannula for intermittent hypoxia  per triage note.  History of head bleed however he denies any headache, vision changes.  He has a nonfocal neuro exam without deficits.  No obvious DVT on exam.  Normal musculoskeletal exam.  Apparently chest x-ray today showed possible pneumonia however given patient's echo today cardiology thought this was likely his mitral regurgitation.  He was sent here for admission and further work-up.  Given wheeze will have some albuterol.-Grossly fluid overloaded  Labs and imaging personally reviewed and interpreted:  CBC without leukocytosis Metabolic panel hyponatremia at 124, calcium 8.5, no additional electrolyte, renal or liver abnormality BNP hyponatremia 124 Trop 18 COVID negative DG chest with areas of apparent pneumonia in the anterior segment right upper lobe and in the posteromedial left base region. Heart size within normal limits. No adenopathy. ECHO with severe MR possible leaflet rupture  CONSULT with Trish with Cardiology service who will send team to evaluate patient  Patient critically ill with acute hypoxic respiratory failure likely in setting of severe mitral MR with possible leaflet rupture. Will be admitted to ICU under cardiology for close monitoring.   The patient appears reasonably stabilized for admission considering the current resources,  flow, and capabilities available in the ED at this time, and I doubt any other [facility] requiring further screening and/or treatment in the ED prior to admission.     MDM Rules/Calculators/A&P                          Sarvesh Meddaugh was evaluated in Emergency Department on 05/21/2020 for the symptoms described in the history of present illness. He was evaluated in the context of the global COVID-19 pandemic, which necessitated consideration that the patient might be at risk for infection with the SARS-CoV-2 virus that causes COVID-19. Institutional protocols and algorithms that pertain to the evaluation of patients at risk for COVID-19 are in a state of rapid change based on information released by regulatory bodies including the CDC and federal and state organizations. These policies and algorithms were followed during the patient's care in the ED. Final Clinical Impression(s) / ED Diagnoses Final diagnoses:  Acute respiratory failure with hypoxia (HCC)  Elevated troponin  Mitral regurgitation, acute    Rx / DC Orders ED Discharge Orders    None       Glendene Wyer A, PA-C 05/21/20 1843    Pricilla Loveless, MD 05/21/20 1910

## 2020-05-21 NOTE — ED Triage Notes (Addendum)
Pt bib family with reports of increased shob X 5 days. Pt had an echo done today and called to come to the ED for a leaky valve.

## 2020-05-21 NOTE — Telephone Encounter (Signed)
[facility] radiology called to let us know that there is pneumonia on the chest x-ray done today. Will make dr Flora Lipps aware.

## 2020-05-21 NOTE — Progress Notes (Signed)
Placed patient on home CPAP for night with oxygen set 2lpm.

## 2020-05-22 ENCOUNTER — Encounter (HOSPITAL_COMMUNITY): Payer: Self-pay | Admitting: Cardiology

## 2020-05-22 ENCOUNTER — Inpatient Hospital Stay (HOSPITAL_COMMUNITY): Payer: Medicare HMO

## 2020-05-22 ENCOUNTER — Inpatient Hospital Stay (HOSPITAL_COMMUNITY): Payer: Medicare HMO | Admitting: Anesthesiology

## 2020-05-22 ENCOUNTER — Encounter (HOSPITAL_COMMUNITY)
Admission: EM | Disposition: A | Discharge status: Home / Self Care | Payer: Self-pay | Source: Home / Self Care | Attending: Cardiology

## 2020-05-22 DIAGNOSIS — I340 Nonrheumatic mitral (valve) insufficiency: Secondary | ICD-10-CM

## 2020-05-22 DIAGNOSIS — I361 Nonrheumatic tricuspid (valve) insufficiency: Secondary | ICD-10-CM

## 2020-05-22 HISTORY — PX: TEE WITHOUT CARDIOVERSION: SHX5443

## 2020-05-22 LAB — BASIC METABOLIC PANEL
Anion gap: 9 (ref 5–15)
BUN: 20 mg/dL (ref 8–23)
CO2: 24 mmol/L (ref 22–32)
Calcium: 8.5 mg/dL — ABNORMAL LOW (ref 8.9–10.3)
Chloride: 93 mmol/L — ABNORMAL LOW (ref 98–111)
Creatinine, Ser: 0.92 mg/dL (ref 0.61–1.24)
GFR, Estimated: >60 mL/min (ref >60)
Glucose, Bld: 94 mg/dL (ref 70–99)
Potassium: 3.9 mmol/L (ref 3.5–5.1)
Sodium: 126 mmol/L — ABNORMAL LOW (ref 135–145)

## 2020-05-22 LAB — CBC
HCT: 32 % — ABNORMAL LOW (ref 39.0–52.0)
Hemoglobin: 11 g/dL — ABNORMAL LOW (ref 13.0–17.0)
MCH: 30.6 pg (ref 26.0–34.0)
MCHC: 34.4 g/dL (ref 30.0–36.0)
MCV: 88.9 fL (ref 80.0–100.0)
Platelets: 331 10*3/uL (ref 150–400)
RBC: 3.6 MIL/uL — ABNORMAL LOW (ref 4.22–5.81)
RDW: 13.2 % (ref 11.5–15.5)
WBC: 7.7 10*3/uL (ref 4.0–10.5)
nRBC: 0 % (ref 0.0–0.2)

## 2020-05-22 SURGERY — ECHOCARDIOGRAM, TRANSESOPHAGEAL
Anesthesia: Monitor Anesthesia Care

## 2020-05-22 MED ORDER — FUROSEMIDE 10 MG/ML IJ SOLN
40.0000 mg | Freq: Two times a day (BID) | INTRAMUSCULAR | Status: DC
  Administered 2020-05-22 – 2020-05-24 (×4): 40 mg via INTRAVENOUS
  Filled 2020-05-22 (×4): qty 4

## 2020-05-22 MED ORDER — FLUTICASONE PROPIONATE 50 MCG/ACT NA SUSP
1.0000 | Freq: Two times a day (BID) | NASAL | Status: DC
  Administered 2020-05-22 – 2020-05-24 (×4): 1 via NASAL
  Filled 2020-05-22 (×3): qty 16

## 2020-05-22 MED ORDER — PROPOFOL 500 MG/50ML IV EMUL
INTRAVENOUS | Status: DC | PRN
  Administered 2020-05-22: 75 ug/kg/min via INTRAVENOUS

## 2020-05-22 MED ORDER — SERTRALINE HCL 25 MG PO TABS
25.0000 mg | ORAL_TABLET | Freq: Every day | ORAL | Status: DC
Start: 2020-05-23 — End: 2020-05-24
  Administered 2020-05-23 – 2020-05-24 (×2): 25 mg via ORAL
  Filled 2020-05-22 (×2): qty 1

## 2020-05-22 MED ORDER — PHENYLEPHRINE 40 MCG/ML (10ML) SYRINGE FOR IV PUSH (FOR BLOOD PRESSURE SUPPORT)
PREFILLED_SYRINGE | INTRAVENOUS | Status: DC | PRN
  Administered 2020-05-22: 120 ug via INTRAVENOUS

## 2020-05-22 MED ORDER — PHENYLEPHRINE HCL-NACL 10-0.9 MG/250ML-% IV SOLN
INTRAVENOUS | Status: DC | PRN
  Administered 2020-05-22: 20 ug/min via INTRAVENOUS

## 2020-05-22 MED ORDER — LACTATED RINGERS IV SOLN: INTRAVENOUS | Status: DC | PRN

## 2020-05-22 MED ORDER — PROPOFOL 10 MG/ML IV BOLUS
INTRAVENOUS | Status: DC | PRN
  Administered 2020-05-22: 20 mg via INTRAVENOUS

## 2020-05-22 NOTE — Progress Notes (Signed)
Progress Note  Patient Name: Adrian [name] Date of Encounter: 05/23/2020  [facility] Cardiologist: Reatha Harps, MD   Subjective   Patient feels well. Breathing improved. Had difficulty with his CPAP mask last night so feels tired this morning. Hoping to take a nap.  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 At least moderate-to-severe MR (eccentric), mild-to-moderate TR  Na improving 123-->126-->128 Cr stable 1.0 Net negative 2.85L  Inpatient Medications    Scheduled Meds: . aspirin EC  81 mg Oral Daily  . Chlorhexidine Gluconate Cloth  6 each Topical Daily  . fluticasone  1 spray Each Nare BID  . furosemide  40 mg Intravenous BID  . pantoprazole  40 mg Oral Daily  . sertraline  25 mg Oral Daily   Continuous Infusions:  PRN Meds: acetaminophen, nitroGLYCERIN, ondansetron (ZOFRAN) IV   Vital Signs    Vitals:   05/23/20 0600 05/23/20 0700 05/23/20 0750 05/23/20 0800  BP: 138/77 (!) 148/81  117/70  Pulse: 71 83  89  Resp: 18 15  (!) 27  Temp:   97.9 F (36.6 C)   TempSrc:      SpO2: 100% 100%  99%  Weight:      Height:        Intake/Output Summary (Last 24 hours) at 05/23/2020 0953 Last data filed at 05/23/2020 0800 Gross per 24 hour  Intake 700 ml  Output 3750 ml  Net -3050 ml   Last 3 Weights 05/22/2020 05/22/2020 05/07/2020  Weight (lbs) 155 lb 165 lb 5.5 oz 162 lb 3.2 oz  Weight (kg) 70.308 kg 75 kg 73.573 kg      Telemetry    Atrial fibrillation; rate controlled - Personally Reviewed  ECG    No new tracing - Personally Reviewed  Physical Exam   GEN: Elderly, comfortable Neck: Prominent V-wave in JVD Cardiac: Irregular, 3/6 systolic murmur Respiratory: Diminished at the bases GI: Soft, nontender, non-distended  MS: No edema; No deformity. Neuro:  Nonfocal  Psych: Normal affect   Labs    High Sensitivity Troponin:   Recent Labs  Lab 05/21/20 1600 05/21/20 1825  TROPONINIHS 18* 17       Chemistry Recent Labs  Lab 05/21/20 1350 05/21/20 1806 05/21/20 2044 05/22/20 0548 05/23/20 0750  NA 124* 123*  --  126* 128*  K 4.4 4.3  --  3.9 3.7  CL 92*  --   --  93* 92*  CO2 24  --   --  24 23  GLUCOSE 96  --   --  94 93  BUN 23  --   --  20 19  CREATININE 0.95  --  0.89 0.92 1.00  CALCIUM 8.5*  --   --  8.5* 8.8*  PROT  --   --  6.3*  --   --   ALBUMIN  --   --  3.3*  --   --   AST  --   --  40  --   --   ALT  --   --  42  --   --   ALKPHOS  --   --  128*  --   --   BILITOT  --   --  0.8  --   --   GFRNONAA >60  --  >60 >60 >60  ANIONGAP 8  --   --  9 13     Hematology Recent Labs  Lab 05/21/20 1350 05/21/20 1806 05/21/20 2044  05/22/20 0548  WBC 8.8  --  9.3 7.7  RBC 3.51*  --  3.75* 3.60*  HGB 10.8* 11.2* 11.5* 11.0*  HCT 32.4* 33.0* 33.5* 32.0*  MCV 92.3  --  89.3 88.9  MCH 30.8  --  30.7 30.6  MCHC 33.3  --  34.3 34.4  RDW 13.5  --  13.3 13.2  PLT 370  --  375 331    BNP Recent Labs  Lab 05/21/20 1600  BNP 834.4*     DDimer No results for input(s): DDIMER in the last 168 hours.   Radiology    DG Chest 2 View  Result Date: 05/21/2020 CLINICAL DATA:  Shortness of breath and fatigue EXAM: CHEST - 2 VIEW COMPARISON:  None. FINDINGS: There is airspace opacity in the anterior segment right upper lobe consistent with pneumonia. There is consolidation in the posteromedial left base consistent with a second focus of pneumonia. The lungs elsewhere are clear. Heart is upper normal in size with pulmonary vascularity within normal limits. No adenopathy. There is aortic atherosclerosis. Postoperative change noted in the right shoulder. There is degenerative change in each shoulder with superior migration of each humeral head. IMPRESSION: Areas of apparent pneumonia in the anterior segment right upper lobe and in the posteromedial left base region. Heart size within normal limits. No adenopathy. Aortic Atherosclerosis (ICD10-I70.0). 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 These results will be called to the ordering clinician or representative by the Radiologist Assistant, and communication documented in the PACS or [employer]. Electronically Signed   By: Bretta Bang III M.D.   On: 05/21/2020 11:17   ECHOCARDIOGRAM COMPLETE  Result Date: 05/21/2020    ECHOCARDIOGRAM REPORT   Patient Name:   Adrian [name] Date of Exam: 05/21/2020 Medical Rec #:  [id number]      Height:       64.0 in Accession #:    [id number]     Weight:       162.2 lb Date of Birth:  1927/09/28      BSA:          1.790 m Patient Age:    87 years       BP:           132/74 mmHg Patient Gender: M              HR:           73 bpm. Exam Location:  [place] Procedure: 2D Echo, Cardiac Doppler and Color Doppler Indications:    R06.02 SOB; R06.9 DOE; R53.83 Fatigue  History:        Patient has no prior history of Echocardiogram examinations and                 Patient has prior history of Echocardiogram examinations, most                 recent 11/19/2019. History of intracranial hemorrhage.,                 Arrythmias:Atrial Fibrillation, Signs/Symptoms:Shortness of                 Breath and Dyspnea; Risk Factors:Hypertension. 04/22/20 ER                 visit, he was found to be in A-fib with RVR and hypotensive. It                 was felt that he was dehydrated.  SOB has been steadily worsening.  Sonographer:    Vella KohlerMelissa Church BS, RVT, RDCS Referring Phys: [phone] [name] IMPRESSIONS  1. Left ventricular ejection fraction, by estimation, is 55 to 60%. The left ventricle has normal function. The left ventricle has no regional wall motion abnormalities. There is moderate left ventricular hypertrophy. Left ventricular diastolic parameters are indeterminate.  2. Right ventricular systolic function is mildly reduced. The right ventricular size is moderately enlarged.  3. Left atrial size was mildly dilated.  4. Right atrial size was mildly dilated.  5. Moderate  pleural effusion in the left lateral region.  6. 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 The eccentricity of the jet may lead to underestimation of MR. The MV/AV VTI ratio is around 1 suggesting moderate MR. . The mitral valve is abnormal. Moderate mitral valve regurgitation. No evidence of mitral stenosis.  7. Tricuspid valve regurgitation is mild to moderate.  8. The aortic valve is tricuspid. There is moderate calcification of the aortic valve. There is moderate thickening of the aortic valve. Aortic valve regurgitation is mild.  9. Moderate pulmonary HTN, PASP is 57 mmHg. 10. The inferior vena cava is dilated in size with <50% respiratory variability, suggesting right atrial pressure of 15 mmHg. Comparison(s): [facility] showed LV EF 60-65%, no RWMA, mild LVH, mild LAE, tricuspid AoV with mild AI, mild MR. FINDINGS  Left Ventricle: LVOT VTI appears underestimated. Left ventricular ejection fraction, by estimation, is 55 to 60%. The left ventricle has normal function. The left ventricle has no regional wall motion abnormalities. Global longitudinal strain performed but not reported based on interpreter judgement due to suboptimal tracking. The left ventricular internal cavity size was normal in size. There is moderate left ventricular hypertrophy. Left ventricular diastolic parameters are indeterminate. Right Ventricle: The right ventricular size is moderately enlarged. No increase in right ventricular wall thickness. Right ventricular systolic function is mildly reduced. Left Atrium: Left atrial size was mildly dilated. Right Atrium: Right atrial size was mildly dilated. Pericardium: There is no evidence of pericardial effusion. 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 The eccentricity of the jet may lead to underestimation of MR. The MV/AV VTI ratio is around 1 suggesting moderate MR. The mitral valve is  abnormal. Moderate  mitral valve regurgitation. No evidence of mitral valve stenosis. Tricuspid Valve: The tricuspid valve is normal in structure. Tricuspid valve regurgitation is mild to moderate. No evidence of tricuspid stenosis. Aortic Valve: The aortic valve is tricuspid. There is moderate calcification of the aortic valve. There is moderate thickening of the aortic valve. There is moderate aortic valve annular calcification. Aortic valve regurgitation is mild. Aortic regurgitation PHT measures 688 msec. Aortic valve mean gradient measures 5.7 mmHg. Aortic valve peak gradient measures 13.0 mmHg. Aortic valve area, by VTI measures 1.70 cm. Pulmonic Valve: The pulmonic valve was not well visualized. Pulmonic valve regurgitation is not visualized. No evidence of pulmonic stenosis. Aorta: The aortic root is normal in size and structure. Pulmonary Artery: Moderate pulmonary HTN, PASP is 57 mmHg. Venous: The inferior vena cava is dilated in size with less than 50% respiratory variability, suggesting right atrial pressure of 15 mmHg. IAS/Shunts: The interatrial septum was not well visualized. Additional Comments: There is a moderate pleural effusion in the left lateral region.  LEFT VENTRICLE PLAX 2D LVIDd:         3.49 cm     Diastology LVIDs:  2.56 cm     LV e' medial:    8.92 cm/s LV PW:         1.43 cm     LV E/e' medial:  19.2 LV IVS:        1.56 cm     LV e' lateral:   9.14 cm/s LVOT diam:     2.30 cm     LV E/e' lateral: 18.7 LV SV:         52 LV SV Index:   29 LVOT Area:     4.15 cm  LV Volumes (MOD) LV vol d, MOD A2C: 72.1 ml LV vol d, MOD A4C: 64.3 ml LV vol s, MOD A2C: 31.2 ml LV vol s, MOD A4C: 25.6 ml LV SV MOD A2C:     40.9 ml LV SV MOD A4C:     64.3 ml LV SV MOD BP:      43.4 ml RIGHT VENTRICLE RV Basal diam:  3.98 cm RV Mid diam:    2.62 cm RV S prime:     14.70 cm/s TAPSE (M-mode): 1.5 cm LEFT ATRIUM             Index       RIGHT ATRIUM           Index LA diam:        4.60 cm 2.57 cm/m  RA  Area:     19.30 cm LA Vol (A2C):   63.6 ml 35.54 ml/m RA Volume:   51.80 ml  28.94 ml/m LA Vol (A4C):   51.5 ml 28.78 ml/m LA Biplane Vol: 58.1 ml 32.46 ml/m  AORTIC VALVE                    PULMONIC VALVE AV Area (Vmax):    1.86 cm     PV Vmax:       1.07 m/s AV Area (Vmean):   2.10 cm     PV Peak grad:  4.6 mmHg AV Area (VTI):     1.70 cm AV Vmax:           180.16 cm/s AV Vmean:          110.806 cm/s AV VTI:            0.308 m AV Peak Grad:      13.0 mmHg AV Mean Grad:      5.7 mmHg LVOT Vmax:         80.60 cm/s LVOT Vmean:        56.100 cm/s LVOT VTI:          0.126 m LVOT/AV VTI ratio: 0.41 AI PHT:            688 msec AR Vena Contracta: 0.40 cm  AORTA Ao Root diam: 3.70 cm Ao Asc diam:  3.40 cm MR Peak grad:    98.4 mmHg   TRICUSPID VALVE MR Mean grad:    66.0 mmHg   TR Peak grad:   42.5 mmHg MR Vmax:         496.00 cm/s TR Vmax:        326.00 cm/s MR Vmean:        387.0 cm/s MR PISA:         1.57 cm    SHUNTS MR PISA Eff ROA: 7 mm       Systemic VTI:  0.13 m MR PISA Radius:  0.50 cm     Systemic Diam: 2.30 cm MV E  velocity: 171.00 cm/s MV A velocity: 35.80 cm/s MV E/A ratio:  4.78 Dina Rich MD Electronically signed by Dina Rich MD Signature Date/Time: 05/21/2020/10:45:05 AM    Final    ECHO TEE  Result Date: 05/22/2020    TRANSESOPHOGEAL ECHO REPORT   Patient Name:   Adrian [name] Date of Exam: 05/22/2020 Medical Rec #:  [id number]      Height:       67.0 in Accession #:    [id number]     Weight:       155.0 lb Date of Birth:  02/04/1928      BSA:          1.815 m Patient Age:    87 years       BP:           159/102 mmHg Patient Gender: M              HR:           66 bpm. Exam Location:  Inpatient Procedure: Transesophageal Echo and 3D Echo Indications:     Mitral valve disease  History:         Patient has prior history of Echocardiogram examinations, most                  recent 05/21/2020. Risk Factors:Non-Smoker and Sleep Apnea.  Sonographer:     Renella Cunas RDCS Referring Phys:   [id number] BRIDGETTE CHRISTOPHER Diagnosing Phys: Jodelle Red MD PROCEDURE: The transesophogeal probe was passed without difficulty through the esophogus of the patient. Sedation performed by different physician. The patient was monitored while under deep sedation. Anesthestetic sedation was provided intravenously by Anesthesiology: 294.17mg  of Propofol. The patient developed no complications during the procedure. IMPRESSIONS  1. Left ventricular ejection fraction, by estimation, is 55 to 60%. The left ventricle has normal function.  2. Right ventricular systolic function is normal. The right ventricular size is normal.  3. No left atrial/left atrial appendage thrombus was detected.  4. There is severe prolapse of P2 leaflet. 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 The MR jet is very eccentric and difficult to quantitate, but favor severe regurgitation.. The mitral valve is abnormal. Moderate to severe mitral valve regurgitation. No evidence of mitral stenosis.  5. Tricuspid valve regurgitation is mild to moderate.  6. The aortic valve is tricuspid. Aortic valve regurgitation is mild. No aortic stenosis is present. Conclusion(s)/Recommendation(s): Findings and images discussed with Drs. Skains and O'Neal. 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 Very eccentric MR jet. Given anatomy of valve and appearance of jet, favor severe eccentric mitral regurgitation. Of note, he was hypotensive and required phenylephrine during anesthesia. This may also affect the appearance of mitral regurgitation, underestimating it on these images. FINDINGS  Left Ventricle: Left ventricular ejection fraction, by estimation, is 55 to 60%. The left ventricle has normal function. The left ventricular internal cavity size was normal in size. Right Ventricle: The right ventricular size is normal. No increase in right ventricular wall thickness. Right  ventricular systolic function is normal. Left Atrium: Left atrial size was not assessed. No left atrial/left atrial appendage thrombus was detected. Right Atrium: Right atrial size was not assessed. Pericardium: Trivial pericardial effusion is present. Mitral Valve: There is severe prolapse of P2 leaflet. 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 The MR jet is very  eccentric and difficult to quantitate, but favor severe regurgitation. The mitral valve is  abnormal. Moderate to severe mitral valve regurgitation. No evidence of mitral valve stenosis. Tricuspid Valve: The tricuspid valve is normal in structure. Tricuspid valve regurgitation is mild to moderate. Aortic Valve: The aortic valve is tricuspid. Aortic valve regurgitation is mild. No aortic stenosis is present. Pulmonic Valve: The pulmonic valve was normal in structure. Pulmonic valve regurgitation is mild. Aorta: The aortic root and ascending aorta are structurally normal, with no evidence of dilitation. IAS/Shunts: No atrial level shunt detected by color flow Doppler. Jodelle Red MD Electronically signed by Jodelle Red MD Signature Date/Time: 05/22/2020/3:47:24 PM    Final     Cardiac Studies   ECHO 05/21/20: 1. Left ventricular ejection fraction, by estimation, is 55 to 60%. The  left ventricle has normal function. The left ventricle has no regional  wall motion abnormalities. There is moderate left ventricular hypertrophy.  Left ventricular diastolic  parameters are indeterminate.  2. Right ventricular systolic function is mildly reduced. The right  ventricular size is moderately enlarged.  3. Left atrial size was mildly dilated.  4. Right atrial size was mildly dilated.  5. Moderate pleural effusion in the left lateral region.  6. Prolapse of the posterior MV leaflet with very eccentric anteriorally  directed MR. The eccentricity of the jet may lead to  underestimation of  MR. The MV/AV VTI ratio is around 1 suggesting moderate MR. . The mitral  valve is abnormal. Moderate mitral  valve regurgitation. No evidence of mitral stenosis.  7. Tricuspid valve regurgitation is mild to moderate.  8. The aortic valve is tricuspid. There is moderate calcification of the  aortic valve. There is moderate thickening of the aortic valve. Aortic  valve regurgitation is mild.  9. Moderate pulmonary HTN, PASP is 57 mmHg.  10. The inferior vena cava is dilated in size with <50% respiratory  variability, suggesting right atrial pressure of 15 mmHg.  TEE 05/22/20: IMPRESSIONS  1. Left ventricular ejection fraction, by estimation, is 55 to 60%. The  left ventricle has normal function.  2. Right ventricular systolic function is normal. The right ventricular  size is normal.  3. No left atrial/left atrial appendage thrombus was detected.  4. There is severe prolapse of P2 leaflet. 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 The MR jet is  very eccentric and difficult to quantitate, but favor severe  regurgitation.. The mitral valve is  abnormal. Moderate to severe mitral valve regurgitation. No evidence of  mitral stenosis.  5. Tricuspid valve regurgitation is mild to moderate.  6. The aortic valve is tricuspid. Aortic valve regurgitation is mild. No  aortic stenosis is present.   Conclusion(s)/Recommendation(s): Findings and images discussed with Drs.  Skains and O'Neal. 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 Very eccentric MR jet. Given anatomy of valve  and appearance of jet, favor severe  eccentric mitral regurgitation. Of note, he was hypotensive and required  phenylephrine during anesthesia. This may also affect the appearance of  mitral regurgitation, underestimating it on these images.  Patient Profile     87 y.o. male who presented with worsening  SOB found to have acute diastolic heart failure in the setting of moderate-to-severe MR with P2 prolapse. Now improving with diuresis.  Assessment & Plan    1. Acute respiratory failure in the setting of at least moderate-to-severe MR with  severe P2 prolapse: patient has been experiencing worsening fatigue and dyspnea over the past couple of weeks, acutely worse over the past couple of days. CXR with evidence of pulmonary edema. TEE with severe P2 prolapse, possible cleft and at least moderate-to-severe eccentric MR. Now clinically improving with IV lasiz -- Continue IV lasix 40mg  BID--likely to PO tomorrow -- Not on any nodal agents or vasodilators; HR and BP controlled -- May benefit from Mitra-Clip--plan to follow-up with structural as out-patient -- If needed to further quantify MR; can consider cardiac MRI as out-patient -- Low Na diet -- Monitor I/Os  2. Persistent Afib:  Focus has been on rate control as he has not been a candidate for OAC with hx of ICH while on coumadin -- rate stable at this time  3. HTN:  Well controlled. Off medications -Continue to monitor  For questions or updates, please contact [facility] Please consult [email or web address] for contact info under        Signed, , MD  05/23/2020, 9:53 AM

## 2020-05-22 NOTE — Transfer of Care (Signed)
Immediate Anesthesia Transfer of Care Note  Patient: Adrian [name]  Procedure(s) Performed: TRANSESOPHAGEAL ECHOCARDIOGRAM (TEE) (N/A )  Patient Location: Endoscopy Unit  Anesthesia Type:MAC  Level of Consciousness: drowsy and patient cooperative  Airway & Oxygen Therapy: Patient Spontanous Breathing and Patient connected to nasal cannula oxygen  Post-op Assessment: Report given to RN and Post -op Vital signs reviewed and stable  Post vital signs: Reviewed and stable  Last Vitals:  Vitals Value Taken Time  BP 147/80 05/22/20 1336  Temp 36.5 C 05/22/20 1333  Pulse 82 05/22/20 1338  Resp 19 05/22/20 1338  SpO2 100 % 05/22/20 1338  Vitals shown include unvalidated device data.  Last Pain:  Vitals:   05/22/20 1333  TempSrc: Axillary  PainSc:          Complications: No complications documented.

## 2020-05-22 NOTE — Progress Notes (Signed)
  Echocardiogram Echocardiogram Transesophageal has been performed.  Adrian [name] 05/22/2020, 1:27 PM

## 2020-05-22 NOTE — Progress Notes (Signed)
Shift summary: TEE completed in endo lab.   N: AAOx4, HOH, afebrile, walking with front wheel walker around unit. Able to tolerate 122ft before becoming SOB.   R: Weaned from [facility] to RA after TEE. No SOB or increased WOB noted at rest.   CV: Afib 60-80 throughout shift. See Dr. Di Kindle note for TEE results.   GI: NPO for TEE, then cardiac diet with good appetite. No BM, passing gas.   GU: condom cath and suction catheter with adequate UOP. Receiving 40mg  lasix BID IVP.   No new skin issues, sons at bedside and supportive of care.   Ching Rabideau RN

## 2020-05-22 NOTE — Anesthesia Preprocedure Evaluation (Addendum)
Anesthesia Evaluation  Patient identified by MRN, date of birth, ID band Patient awake    Reviewed: Allergy & Precautions, NPO status , Patient's Chart, lab work & pertinent test results  Airway Mallampati: III  TM Distance: >3 FB Neck ROM: Full    Dental  (+) Missing, Dental Advisory Given, Chipped   Pulmonary sleep apnea ,    Pulmonary exam normal breath sounds clear to auscultation       Cardiovascular +CHF  Normal cardiovascular exam+ dysrhythmias Atrial Fibrillation + Valvular Problems/Murmurs MR  Rhythm:Regular Rate:Normal  ECHO 05/21/20:  1. Left ventricular ejection fraction, by estimation, is 55 to 60%. The  left ventricle has normal function. The left ventricle has no regional  wall motion abnormalities. There is moderate left ventricular hypertrophy.  Left ventricular diastolic  parameters are indeterminate.  2. Right ventricular systolic function is mildly reduced. The right  ventricular size is moderately enlarged.  3. Left atrial size was mildly dilated.  4. Right atrial size was mildly dilated.  5. Moderate pleural effusion in the left lateral region.  6. Prolapse of the posterior MV leaflet with very eccentric anteriorally  directed MR. The eccentricity of the jet may lead to underestimation of  MR. The MV/AV VTI ratio is around 1 suggesting moderate MR. . The mitral  valve is abnormal. Moderate mitral  valve regurgitation. No evidence of mitral stenosis.  7. Tricuspid valve regurgitation is mild to moderate.  8. The aortic valve is tricuspid. There is moderate calcification of the  aortic valve. There is moderate thickening of the aortic valve. Aortic  valve regurgitation is mild.  9. Moderate pulmonary HTN, PASP is 57 mmHg.  10. The inferior vena cava is dilated in size with <50% respiratory  variability, suggesting right atrial pressure of 15 mmHg   Neuro/Psych negative neurological ROS   negative psych ROS   GI/Hepatic Neg liver ROS, GERD  Controlled and Medicated,  Endo/Other  negative endocrine ROS  Renal/GU negative Renal ROS  negative genitourinary   Musculoskeletal negative musculoskeletal ROS (+)   Abdominal   Peds  Hematology negative hematology ROS (+)   Anesthesia Other Findings TEE for MR  Admitted with acute diastolic heart failure in the setting of severe MR, acute with flail leaflet, progressive dyspnea, pulmonary edema  Reproductive/Obstetrics                            Anesthesia Physical Anesthesia Plan  ASA: III  Anesthesia Plan: MAC   Post-op Pain Management:    Induction: Intravenous  PONV Risk Score and Plan: 1 and Propofol infusion and Treatment may vary due to age or medical condition  Airway Management Planned: Natural Airway  Additional Equipment:   Intra-op Plan:   Post-operative Plan:   Informed Consent: I have reviewed the patients History and Physical, chart, labs and discussed the procedure including the risks, benefits and alternatives for the proposed anesthesia with the patient or authorized representative who has indicated his/her understanding and acceptance.   Patient has DNR.  Discussed DNR with patient and Suspend DNR.   Dental advisory given  Plan Discussed with: CRNA  Anesthesia Plan Comments:        Anesthesia Quick Evaluation

## 2020-05-22 NOTE — Progress Notes (Signed)
RT completed set up of patient's home cpap, patient will self place when ready.

## 2020-05-22 NOTE — Interval H&P Note (Signed)
History and Physical Interval Note:  05/22/2020 12:02 PM  Adrian [name]  has presented today for surgery, with the diagnosis of MITRAL REGURGITATION.  The various methods of treatment have been discussed with the patient and family. After consideration of risks, benefits and other options for treatment, the patient has consented to  Procedure(s): TRANSESOPHAGEAL ECHOCARDIOGRAM (TEE) (N/A) as a surgical intervention.  The patient's history has been reviewed, patient examined, no change in status, stable for surgery.  I have reviewed the patient's chart and labs.  Questions were answered to the patient's satisfaction.     Casson Catena Cristal Deer

## 2020-05-22 NOTE — Progress Notes (Signed)
Heart Failure Navigator Progress Note  Assessed for Heart & Vascular TOC clinic readiness.  Unfortunately at this time the patient does not meet criteria due to HFpEF secondary to severe MVR.   Navigator available for reassessment of patient.   Ozella Rocks, RN, BSN Heart Failure Nurse Navigator [phone]

## 2020-05-22 NOTE — CV Procedure (Addendum)
    TRANSESOPHAGEAL ECHOCARDIOGRAM   NAME:  Adrian [name]   MRN: [id number] DOB:  1928-03-11   ADMIT DATE: 05/21/2020  INDICATIONS: Mitral regurgitation  PROCEDURE:   Informed consent was obtained prior to the procedure. The risks, benefits and alternatives for the procedure were discussed and the patient comprehended these risks.  Risks include, but are not limited to, cough, sore throat, vomiting, nausea, somnolence, esophageal and stomach trauma or perforation, bleeding, low blood pressure, aspiration, pneumonia, infection, trauma to the teeth and death.    Procedural time out performed.   Patient received monitored anesthesia care under the supervision of Dr. Armond Hang. Patient received a total of 294 mg propofol during the procedure. He also required phenylephrine for blood pressure support during the procedure.  The transesophageal probe was inserted in the esophagus and stomach without difficulty and multiple views were obtained.    COMPLICATIONS:    There were no immediate complications.  FINDINGS:  LEFT VENTRICLE: EF = 55-60%. No regional wall motion abnormalities.  RIGHT VENTRICLE: Normal size and function.   LEFT ATRIUM: No thrombus/mass.  LEFT ATRIAL APPENDAGE: No thrombus/mass.   RIGHT ATRIUM: No thrombus/mass.  AORTIC VALVE:  Trileaflet. Mild regurgitation. No vegetation.  MITRAL VALVE:    Prolapsed posterior leaflet. There also appears to be a possible cleft, will review on 3D images, see final report for more information. No vegetation.  TRICUSPID VALVE: Normal structure. Mild to moderate regurgitation. No vegetation.  PULMONIC VALVE: Grossly normal structure. Mild regurgitation. No apparent vegetation.  INTERATRIAL SEPTUM: No PFO or ASD clearly seen by color Doppler.  PERICARDIUM: Trivial effusion noted.  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 Full report to follow  Jodelle Red, MD, PhD [facility]  [address], [address] [place], [place] [address] [phone]   1:15 PM

## 2020-05-22 NOTE — Progress Notes (Signed)
Progress Note  Patient Name: Adrian [name] Date of Encounter: 05/22/2020  [facility] Cardiologist: Reatha Harps, MD   Subjective   Dramatic turnaround.  Much more comfortable, laying flat.  No increased respiratory effort currently.  No chest pain.  Son at bedside.  Richard.  Inpatient Medications    Scheduled Meds: . aspirin EC  81 mg Oral Daily  . Chlorhexidine Gluconate Cloth  6 each Topical Daily  . fluticasone  1 spray Each Nare BID  . furosemide  80 mg Intravenous BID  . heparin  5,000 Units Subcutaneous Q8H  . pantoprazole  40 mg Oral Daily  . sertraline  50 mg Oral Daily   Continuous Infusions:  PRN Meds: acetaminophen, nitroGLYCERIN, ondansetron (ZOFRAN) IV   Vital Signs    Vitals:   05/22/20 0600 05/22/20 0700 05/22/20 0741 05/22/20 0800  BP: 127/68 (!) 116/43  129/86  Pulse: 73 72  75  Resp: 17 17  17   Temp:   (!) 97.5 F (36.4 C)   TempSrc:   Oral   SpO2: 94% 97%  100%  Weight: 75 kg       Intake/Output Summary (Last 24 hours) at 05/22/2020 0848 Last data filed at 05/22/2020 0600 Gross per 24 hour  Intake --  Output 3050 ml  Net -3050 ml   Last 3 Weights 05/22/2020 05/07/2020 04/23/2020  Weight (lbs) 165 lb 5.5 oz 162 lb 3.2 oz 152 lb  Weight (kg) 75 kg 73.573 kg 68.947 kg      Telemetry    AFIB 80's - Personally Reviewed  ECG    AFIB - Personally Reviewed  Physical Exam   GEN: No acute distress.   Neck: No JVD Cardiac: RRR, 2/6 holosystolic murmur left lateral apex rubs, or gallops.  Respiratory:  Crackles improved bilaterally. GI: Soft, nontender, non-distended  MS:  No extremity edema; No deformity. Neuro:  Nonfocal  Psych: Normal affect   Labs    High Sensitivity Troponin:   Recent Labs  Lab 05/21/20 1600 05/21/20 1825  TROPONINIHS 18* 17      Chemistry Recent Labs  Lab 05/21/20 1350 05/21/20 1806 05/21/20 2044 05/22/20 0548  NA 124* 123*  --  126*  K 4.4 4.3  --  3.9  CL 92*  --   --  93*  CO2 24  --    --  24  GLUCOSE 96  --   --  94  BUN 23  --   --  20  CREATININE 0.95  --  0.89 0.92  CALCIUM 8.5*  --   --  8.5*  PROT  --   --  6.3*  --   ALBUMIN  --   --  3.3*  --   AST  --   --  40  --   ALT  --   --  42  --   ALKPHOS  --   --  128*  --   BILITOT  --   --  0.8  --   GFRNONAA >60  --  >60 >60  ANIONGAP 8  --   --  9     Hematology Recent Labs  Lab 05/21/20 1350 05/21/20 1806 05/21/20 2044 05/22/20 0548  WBC 8.8  --  9.3 7.7  RBC 3.51*  --  3.75* 3.60*  HGB 10.8* 11.2* 11.5* 11.0*  HCT 32.4* 33.0* 33.5* 32.0*  MCV 92.3  --  89.3 88.9  MCH 30.8  --  30.7 30.6  MCHC 33.3  --  34.3 34.4  RDW 13.5  --  13.3 13.2  PLT 370  --  375 331    BNP Recent Labs  Lab 05/21/20 1600  BNP 834.4*     DDimer No results for input(s): DDIMER in the last 168 hours.   Radiology    DG Chest 2 View  Result Date: 05/21/2020 CLINICAL DATA:  Shortness of breath and fatigue EXAM: CHEST - 2 VIEW COMPARISON:  None. FINDINGS: There is airspace opacity in the anterior segment right upper lobe consistent with pneumonia. There is consolidation in the posteromedial left base consistent with a second focus of pneumonia. The lungs elsewhere are clear. Heart is upper normal in size with pulmonary vascularity within normal limits. No adenopathy. There is aortic atherosclerosis. Postoperative change noted in the right shoulder. There is degenerative change in each shoulder with superior migration of each humeral head. IMPRESSION: Areas of apparent pneumonia in the anterior segment right upper lobe and in the posteromedial left base region. Heart size within normal limits. No adenopathy. Aortic Atherosclerosis (ICD10-I70.0). Suspect chronic rotator cuff tears bilaterally. These results will be called to the ordering clinician or representative by the Radiologist Assistant, and communication documented in the PACS or [employer] Dashboard. Electronically Signed   By: Bretta BangWilliam  Woodruff III M.D.   On: 05/21/2020  11:17   ECHOCARDIOGRAM COMPLETE  Result Date: 05/21/2020    ECHOCARDIOGRAM REPORT   Patient Name:   Adrian [name] Date of Exam: 05/21/2020 Medical Rec #:  [id number]      Height:       64.0 in Accession #:    [id number]     Weight:       162.2 lb Date of Birth:  May 02, 1927      BSA:          1.790 m Patient Age:    87 years       BP:           132/74 mmHg Patient Gender: M              HR:           73 bpm. Exam Location:  [facility] Procedure: 2D Echo, Cardiac Doppler and Color Doppler Indications:    R06.02 SOB; R06.9 DOE; R53.83 Fatigue  History:        Patient has no prior history of Echocardiogram examinations and                 Patient has prior history of Echocardiogram examinations, most                 recent 11/19/2019. History of intracranial hemorrhage.,                 Arrythmias:Atrial Fibrillation, Signs/Symptoms:Shortness of                 Breath and Dyspnea; Risk Factors:Hypertension. 04/22/20 ER                 visit, he was found to be in A-fib with RVR and hypotensive. It                 was felt that he was dehydrated.                 SOB has been steadily worsening.  Sonographer:    Vella KohlerMelissa Church BS, RVT, RDCS Referring Phys: [phone] [name] IMPRESSIONS  1. Left ventricular ejection fraction, by estimation, is 55 to 60%. The left ventricle has normal  function. The left ventricle has no regional wall motion abnormalities. There is moderate left ventricular hypertrophy. Left ventricular diastolic parameters are indeterminate.  2. Right ventricular systolic function is mildly reduced. The right ventricular size is moderately enlarged.  3. Left atrial size was mildly dilated.  4. Right atrial size was mildly dilated.  5. Moderate pleural effusion in the left lateral region.  6. 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 The eccentricity of the jet may lead to underestimation of MR. The MV/AV VTI ratio is around 1 suggesting moderate MR. . The  mitral valve is abnormal. Moderate mitral valve regurgitation. No evidence of mitral stenosis.  7. Tricuspid valve regurgitation is mild to moderate.  8. The aortic valve is tricuspid. There is moderate calcification of the aortic valve. There is moderate thickening of the aortic valve. Aortic valve regurgitation is mild.  9. Moderate pulmonary HTN, PASP is 57 mmHg. 10. The inferior vena cava is dilated in size with <50% respiratory variability, suggesting right atrial pressure of 15 mmHg. Comparison(s): Prior Echo at  [facility] showed LV EF 60-65%, no RWMA, mild LVH, mild LAE, tricuspid AoV with mild AI, mild MR. FINDINGS  Left Ventricle: LVOT VTI appears underestimated. Left ventricular ejection fraction, by estimation, is 55 to 60%. The left ventricle has normal function. The left ventricle has no regional wall motion abnormalities. Global longitudinal strain performed but not reported based on interpreter judgement due to suboptimal tracking. The left ventricular internal cavity size was normal in size. There is moderate left ventricular hypertrophy. Left ventricular diastolic parameters are indeterminate. Right Ventricle: The right ventricular size is moderately enlarged. No increase in right ventricular wall thickness. Right ventricular systolic function is mildly reduced. Left Atrium: Left atrial size was mildly dilated. Right Atrium: Right atrial size was mildly dilated. Pericardium: There is no evidence of pericardial effusion. 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 The eccentricity of the jet may lead to underestimation of MR. The MV/AV VTI ratio is around 1 suggesting moderate MR. The mitral valve is abnormal. Moderate  mitral valve regurgitation. No evidence of mitral valve stenosis. Tricuspid Valve: The tricuspid valve is normal in structure. Tricuspid valve regurgitation is mild to moderate. No evidence of tricuspid stenosis. Aortic Valve: The  aortic valve is tricuspid. There is moderate calcification of the aortic valve. There is moderate thickening of the aortic valve. There is moderate aortic valve annular calcification. Aortic valve regurgitation is mild. Aortic regurgitation PHT measures 688 msec. Aortic valve mean gradient measures 5.7 mmHg. Aortic valve peak gradient measures 13.0 mmHg. Aortic valve area, by VTI measures 1.70 cm. Pulmonic Valve: The pulmonic valve was not well visualized. Pulmonic valve regurgitation is not visualized. No evidence of pulmonic stenosis. Aorta: The aortic root is normal in size and structure. Pulmonary Artery: Moderate pulmonary HTN, PASP is 57 mmHg. Venous: The inferior vena cava is dilated in size with less than 50% respiratory variability, suggesting right atrial pressure of 15 mmHg. IAS/Shunts: The interatrial septum was not well visualized. Additional Comments: There is a moderate pleural effusion in the left lateral region.  LEFT VENTRICLE PLAX 2D LVIDd:         3.49 cm     Diastology LVIDs:         2.56 cm     LV e' medial:    8.92 cm/s LV PW:         1.43 cm     LV E/e'  medial:  19.2 LV IVS:        1.56 cm     LV e' lateral:   9.14 cm/s LVOT diam:     2.30 cm     LV E/e' lateral: 18.7 LV SV:         52 LV SV Index:   29 LVOT Area:     4.15 cm  LV Volumes (MOD) LV vol d, MOD A2C: 72.1 ml LV vol d, MOD A4C: 64.3 ml LV vol s, MOD A2C: 31.2 ml LV vol s, MOD A4C: 25.6 ml LV SV MOD A2C:     40.9 ml LV SV MOD A4C:     64.3 ml LV SV MOD BP:      43.4 ml RIGHT VENTRICLE RV Basal diam:  3.98 cm RV Mid diam:    2.62 cm RV S prime:     14.70 cm/s TAPSE (M-mode): 1.5 cm LEFT ATRIUM             Index       RIGHT ATRIUM           Index LA diam:        4.60 cm 2.57 cm/m  RA Area:     19.30 cm LA Vol (A2C):   63.6 ml 35.54 ml/m RA Volume:   51.80 ml  28.94 ml/m LA Vol (A4C):   51.5 ml 28.78 ml/m LA Biplane Vol: 58.1 ml 32.46 ml/m  AORTIC VALVE                    PULMONIC VALVE AV Area (Vmax):    1.86 cm     PV Vmax:        1.07 m/s AV Area (Vmean):   2.10 cm     PV Peak grad:  4.6 mmHg AV Area (VTI):     1.70 cm AV Vmax:           180.16 cm/s AV Vmean:          110.806 cm/s AV VTI:            0.308 m AV Peak Grad:      13.0 mmHg AV Mean Grad:      5.7 mmHg LVOT Vmax:         80.60 cm/s LVOT Vmean:        56.100 cm/s LVOT VTI:          0.126 m LVOT/AV VTI ratio: 0.41 AI PHT:            688 msec AR Vena Contracta: 0.40 cm  AORTA Ao Root diam: 3.70 cm Ao Asc diam:  3.40 cm MR Peak grad:    98.4 mmHg   TRICUSPID VALVE MR Mean grad:    66.0 mmHg   TR Peak grad:   42.5 mmHg MR Vmax:         496.00 cm/s TR Vmax:        326.00 cm/s MR Vmean:        387.0 cm/s MR PISA:         1.57 cm    SHUNTS MR PISA Eff ROA: 7 mm       Systemic VTI:  0.13 m MR PISA Radius:  0.50 cm     Systemic Diam: 2.30 cm MV E velocity: 171.00 cm/s MV A velocity: 35.80 cm/s MV E/A ratio:  4.78 Dina Rich MD Electronically signed by Dina Rich MD Signature Date/Time: 05/21/2020/10:45:05 AM    Final  Cardiac Studies   ECHO 05/21/20:  1. Left ventricular ejection fraction, by estimation, is 55 to 60%. The  left ventricle has normal function. The left ventricle has no regional  wall motion abnormalities. There is moderate left ventricular hypertrophy.  Left ventricular diastolic  parameters are indeterminate.  2. Right ventricular systolic function is mildly reduced. The right  ventricular size is moderately enlarged.  3. Left atrial size was mildly dilated.  4. Right atrial size was mildly dilated.  5. Moderate pleural effusion in the left lateral region.  6. Prolapse of the posterior MV leaflet with very eccentric anteriorally  directed MR. The eccentricity of the jet may lead to underestimation of  MR. The MV/AV VTI ratio is around 1 suggesting moderate MR. . The mitral  valve is abnormal. Moderate mitral  valve regurgitation. No evidence of mitral stenosis.  7. Tricuspid valve regurgitation is mild to moderate.  8. The  aortic valve is tricuspid. There is moderate calcification of the  aortic valve. There is moderate thickening of the aortic valve. Aortic  valve regurgitation is mild.  9. Moderate pulmonary HTN, PASP is 57 mmHg.  10. The inferior vena cava is dilated in size with <50% respiratory  variability, suggesting right atrial pressure of 15 mmHg.  Patient Profile     87 y.o. male admitted with acute diastolic heart failure in the setting of severe mitral gravitation, acute with flail leaflet, progressive dyspnea, pulmonary edema over the last several days  Assessment & Plan    Severe mitral regurgitation Acute respiratory failure Pulmonary edema Acute diastolic heart failure secondary to mitral valve severe regurgitation Persistent atrial fibrillation Hyponatremia  -TEE today 2:00.  N.p.o. -Received Lasix 80 mg IV last night, prescribed twice daily currently.  Excellent diuresis of approximately 3 L.  Extremely comfortable currently.  What a dramatic turnaround.  We will decrease his Lasix to 40 IV twice daily.  He may be able to change to 20 or 40 p.o. daily tomorrow. -Serum sodium slightly improved from 1 23-1 26 with ongoing diuresis.  Free water excess. -In ER, pH 7.4, PCO2 30, PO2 was 94 -Discussed at length CODE STATUS.  Would be amenable to short-term intubation if felt to be reversible.  Would not desire long-term ventilatory care.  Discussed with he and his son.  No shocks, no CPR.  Recently his wife died. -Does have a follow-up appointment with Dr. Guinevere Scarlet on 3/15   For questions or updates, please contact [facility] Please consult [email or web address] for contact info under        Signed, Donato Schultz, MD  05/22/2020, 8:48 AM

## 2020-05-23 DIAGNOSIS — I5033 Acute on chronic diastolic (congestive) heart failure: Secondary | ICD-10-CM

## 2020-05-23 LAB — BASIC METABOLIC PANEL
Anion gap: 13 (ref 5–15)
BUN: 19 mg/dL (ref 8–23)
CO2: 23 mmol/L (ref 22–32)
Calcium: 8.8 mg/dL — ABNORMAL LOW (ref 8.9–10.3)
Chloride: 92 mmol/L — ABNORMAL LOW (ref 98–111)
Creatinine, Ser: 1 mg/dL (ref 0.61–1.24)
GFR, Estimated: >60 mL/min (ref >60)
Glucose, Bld: 93 mg/dL (ref 70–99)
Potassium: 3.7 mmol/L (ref 3.5–5.1)
Sodium: 128 mmol/L — ABNORMAL LOW (ref 135–145)

## 2020-05-23 MED ORDER — DICLOFENAC SODIUM 1 % EX GEL
2.0000 g | Freq: Two times a day (BID) | CUTANEOUS | Status: DC
  Administered 2020-05-23 – 2020-05-24 (×2): 2 g via TOPICAL
  Filled 2020-05-23: qty 100

## 2020-05-23 NOTE — Progress Notes (Signed)
Shift summary: transferred to 2C02  N: AAOx4, HOH, afebrile, walking with personal four wheel walker around unit.   R: RA, wore CPAP for nap. No SOB or increased WOB noted at rest.   CV: Afib 60-80 throughout shift. BP WDL.   GI: cardiac diet with good appetite. BM x1, passing gas.   GU: external catheter on with -1LUOP. Receiving 40mg  lasix BID IVP.   No new skin issues, sons at bedside and supportive of care.   Terese Heier RN

## 2020-05-23 NOTE — Progress Notes (Signed)
Patient self placed home cpap, vitals are stable. RT will continue to monitor.

## 2020-05-24 DIAGNOSIS — I4819 Other persistent atrial fibrillation: Secondary | ICD-10-CM

## 2020-05-24 DIAGNOSIS — J9600 Acute respiratory failure, unspecified whether with hypoxia or hypercapnia: Secondary | ICD-10-CM

## 2020-05-24 DIAGNOSIS — I5032 Chronic diastolic (congestive) heart failure: Secondary | ICD-10-CM

## 2020-05-24 DIAGNOSIS — I619 Nontraumatic intracerebral hemorrhage, unspecified: Secondary | ICD-10-CM

## 2020-05-24 LAB — BASIC METABOLIC PANEL
Anion gap: 9 (ref 5–15)
BUN: 22 mg/dL (ref 8–23)
CO2: 28 mmol/L (ref 22–32)
Calcium: 8.3 mg/dL — ABNORMAL LOW (ref 8.9–10.3)
Chloride: 90 mmol/L — ABNORMAL LOW (ref 98–111)
Creatinine, Ser: 1.2 mg/dL (ref 0.61–1.24)
GFR, Estimated: 57 mL/min — ABNORMAL LOW (ref >60)
Glucose, Bld: 98 mg/dL (ref 70–99)
Potassium: 3.8 mmol/L (ref 3.5–5.1)
Sodium: 127 mmol/L — ABNORMAL LOW (ref 135–145)

## 2020-05-24 LAB — RESP PANEL BY RT-PCR (FLU A&B, COVID) ARPGX2
Influenza A by PCR: NEGATIVE
Influenza B by PCR: NEGATIVE
SARS Coronavirus 2 by RT PCR: NEGATIVE

## 2020-05-24 MED ORDER — NITROGLYCERIN 0.4 MG SL SUBL: 0.4000 mg | SUBLINGUAL_TABLET | SUBLINGUAL | 2 refills | Status: DC | PRN

## 2020-05-24 MED ORDER — POTASSIUM CHLORIDE CRYS ER 20 MEQ PO TBCR
20.0000 meq | EXTENDED_RELEASE_TABLET | Freq: Two times a day (BID) | ORAL | Status: DC
  Administered 2020-05-24: 20 meq via ORAL
  Filled 2020-05-24: qty 1

## 2020-05-24 MED ORDER — FUROSEMIDE 40 MG PO TABS: 40.0000 mg | ORAL_TABLET | Freq: Every day | ORAL | Status: DC

## 2020-05-24 MED ORDER — FUROSEMIDE 40 MG PO TABS: 40.0000 mg | ORAL_TABLET | Freq: Every day | ORAL | 3 refills | Status: DC

## 2020-05-24 NOTE — Progress Notes (Signed)
Progress Note  Patient Name: Adrian [name] Date of Encounter: 05/24/2020  [facility] Cardiologist: Reatha HarpsWesley T O'Neal, MD   Subjective   Doing well. Cr rose slightly to 1.2. Transitioned to PO lasix today.  Inpatient Medications    Scheduled Meds: . aspirin EC  81 mg Oral Daily  . Chlorhexidine Gluconate Cloth  6 each Topical Daily  . diclofenac Sodium  2 g Topical BID  . fluticasone  1 spray Each Nare BID  . furosemide  40 mg Intravenous BID  . pantoprazole  40 mg Oral Daily  . sertraline  25 mg Oral Daily   Continuous Infusions:  PRN Meds: acetaminophen, nitroGLYCERIN, ondansetron (ZOFRAN) IV   Vital Signs    Vitals:   05/23/20 2200 05/23/20 2300 05/24/20 0400 05/24/20 0815  BP: (!) 119/99 99/74 122/72 107/65  Pulse: 88 79 73 76  Resp: 20 20 20 16   Temp:  98.8 F (37.1 C)  97.8 F (36.6 C)  TempSrc:  Oral  Oral  SpO2: 95% 100% 100% 95%  Weight:      Height:        Intake/Output Summary (Last 24 hours) at 05/24/2020 0910 Last data filed at 05/24/2020 0651 Gross per 24 hour  Intake 240 ml  Output 2000 ml  Net -1760 ml   Last 3 Weights 05/22/2020 05/22/2020 05/07/2020  Weight (lbs) 155 lb 165 lb 5.5 oz 162 lb 3.2 oz  Weight (kg) 70.308 kg 75 kg 73.573 kg      Telemetry    Afib with PVCs - Personally Reviewed  ECG    No new tracing - Personally Reviewed  Physical Exam   GEN: No acute distress.   Neck: Prominent v wave in JVD Cardiac: Irregularly irregular, 2/6 systolic murmur. No rubs, or gallops.  Respiratory: Clear to auscultation bilaterally. GI: Soft, nontender, non-distended  MS: No edema; No deformity. Neuro:  Nonfocal  Psych: Normal affect   Labs    High Sensitivity Troponin:   Recent Labs  Lab 05/21/20 1600 05/21/20 1825  TROPONINIHS 18* 17      Chemistry Recent Labs  Lab 05/21/20 2044 05/22/20 0548 05/23/20 0750 05/24/20 0120  NA  --  126* 128* 127*  K  --  3.9 3.7 3.8  CL  --  93* 92* 90*  CO2  --  24 23 28    GLUCOSE  --  94 93 98  BUN  --  20 19 22   CREATININE 0.89 0.92 1.00 1.20  CALCIUM  --  8.5* 8.8* 8.3*  PROT 6.3*  --   --   --   ALBUMIN 3.3*  --   --   --   AST 40  --   --   --   ALT 42  --   --   --   ALKPHOS 128*  --   --   --   BILITOT 0.8  --   --   --   GFRNONAA >60 >60 >60 57*  ANIONGAP  --  9 13 9      Hematology Recent Labs  Lab 05/21/20 1350 05/21/20 1806 05/21/20 2044 05/22/20 0548  WBC 8.8  --  9.3 7.7  RBC 3.51*  --  3.75* 3.60*  HGB 10.8* 11.2* 11.5* 11.0*  HCT 32.4* 33.0* 33.5* 32.0*  MCV 92.3  --  89.3 88.9  MCH 30.8  --  30.7 30.6  MCHC 33.3  --  34.3 34.4  RDW 13.5  --  13.3 13.2  PLT 370  --  375 331    BNP Recent Labs  Lab 05/21/20 1600  BNP 834.4*     DDimer No results for input(s): DDIMER in the last 168 hours.   Radiology    ECHO TEE  Result Date: 05/22/2020    TRANSESOPHOGEAL ECHO REPORT   Patient Name:   Adrian [name] Date of Exam: 05/22/2020 Medical Rec #:  [id number]      Height:       67.0 in Accession #:    [id number]     Weight:       155.0 lb Date of Birth:  Apr 14, 1927      BSA:          1.815 m Patient Age:    87 years       BP:           159/102 mmHg Patient Gender: M              HR:           66 bpm. Exam Location:  Inpatient Procedure: Transesophageal Echo and 3D Echo Indications:     Mitral valve disease  History:         Patient has prior history of Echocardiogram examinations, most                  recent 05/21/2020. Risk Factors:Non-Smoker and Sleep Apnea.  Sonographer:     Renella Cunas RDCS Referring Phys:  [id number] BRIDGETTE CHRISTOPHER Diagnosing Phys: Jodelle Red MD PROCEDURE: The transesophogeal probe was passed without difficulty through the esophogus of the patient. Sedation performed by different physician. The patient was monitored while under deep sedation. Anesthestetic sedation was provided intravenously by Anesthesiology: 294.17mg  of Propofol. The patient developed no complications during the procedure.  IMPRESSIONS  1. Left ventricular ejection fraction, by estimation, is 55 to 60%. The left ventricle has normal function.  2. Right ventricular systolic function is normal. The right ventricular size is normal.  3. No left atrial/left atrial appendage thrombus was detected.  4. There is severe prolapse of P2 leaflet. 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 The MR jet is very eccentric and difficult to quantitate, but favor severe regurgitation.. The mitral valve is abnormal. Moderate to severe mitral valve regurgitation. No evidence of mitral stenosis.  5. Tricuspid valve regurgitation is mild to moderate.  6. The aortic valve is tricuspid. Aortic valve regurgitation is mild. No aortic stenosis is present. Conclusion(s)/Recommendation(s): Findings and images discussed with Drs. Skains and O'Neal. 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 Very eccentric MR jet. Given anatomy of valve and appearance of jet, favor severe eccentric mitral regurgitation. Of note, he was hypotensive and required phenylephrine during anesthesia. This may also affect the appearance of mitral regurgitation, underestimating it on these images. FINDINGS  Left Ventricle: Left ventricular ejection fraction, by estimation, is 55 to 60%. The left ventricle has normal function. The left ventricular internal cavity size was normal in size. Right Ventricle: The right ventricular size is normal. No increase in right ventricular wall thickness. Right ventricular systolic function is normal. Left Atrium: Left atrial size was not assessed. No left atrial/left atrial appendage thrombus was detected. Right Atrium: Right atrial size was not assessed. Pericardium: Trivial pericardial effusion is present. Mitral Valve: There is severe prolapse of P2 leaflet. 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 The MR jet  is very  eccentric and difficult to quantitate, but favor severe regurgitation. The mitral valve is  abnormal. Moderate to severe mitral valve regurgitation. No evidence of mitral valve stenosis. Tricuspid Valve: The tricuspid valve is normal in structure. Tricuspid valve regurgitation is mild to moderate. Aortic Valve: The aortic valve is tricuspid. Aortic valve regurgitation is mild. No aortic stenosis is present. Pulmonic Valve: The pulmonic valve was normal in structure. Pulmonic valve regurgitation is mild. Aorta: The aortic root and ascending aorta are structurally normal, with no evidence of dilitation. IAS/Shunts: No atrial level shunt detected by color flow Doppler. Jodelle Red MD Electronically signed by Jodelle Red MD Signature Date/Time: 05/22/2020/3:47:24 PM    Final     Cardiac Studies   ECHO 05/21/20: 1. Left ventricular ejection fraction, by estimation, is 55 to 60%. The  left ventricle has normal function. The left ventricle has no regional  wall motion abnormalities. There is moderate left ventricular hypertrophy.  Left ventricular diastolic  parameters are indeterminate.  2. Right ventricular systolic function is mildly reduced. The right  ventricular size is moderately enlarged.  3. Left atrial size was mildly dilated.  4. Right atrial size was mildly dilated.  5. Moderate pleural effusion in the left lateral region.  6. Prolapse of the posterior MV leaflet with very eccentric anteriorally  directed MR. The eccentricity of the jet may lead to underestimation of  MR. The MV/AV VTI ratio is around 1 suggesting moderate MR. . The mitral  valve is abnormal. Moderate mitral  valve regurgitation. No evidence of mitral stenosis.  7. Tricuspid valve regurgitation is mild to moderate.  8. The aortic valve is tricuspid. There is moderate calcification of the  aortic valve. There is moderate thickening of the aortic valve. Aortic  valve regurgitation is mild.  9.  Moderate pulmonary HTN, PASP is 57 mmHg.  10. The inferior vena cava is dilated in size with <50% respiratory  variability, suggesting right atrial pressure of 15 mmHg.  TEE 05/22/20: IMPRESSIONS  1. Left ventricular ejection fraction, by estimation, is 55 to 60%. The  left ventricle has normal function.  2. Right ventricular systolic function is normal. The right ventricular  size is normal.  3. No left atrial/left atrial appendage thrombus was detected.  4. There is severe prolapse of P2 leaflet. 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 The MR jet is  very eccentric and difficult to quantitate, but favor severe  regurgitation.. The mitral valve is  abnormal. Moderate to severe mitral valve regurgitation. No evidence of  mitral stenosis.  5. Tricuspid valve regurgitation is mild to moderate.  6. The aortic valve is tricuspid. Aortic valve regurgitation is mild. No  aortic stenosis is present.   Conclusion(s)/Recommendation(s): Findings and images discussed with Drs.  Skains and O'Neal. 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 Very eccentric MR jet. Given anatomy of valve  and appearance of jet, favor severe  eccentric mitral regurgitation. Of note, he was hypotensive and required  phenylephrine during anesthesia. This may also affect the appearance of  mitral regurgitation, underestimating it on these images.  Patient Profile     87 y.o. male who presented with worsening SOB found to have acute diastolic heart failure in the setting of moderate-to-severe MR with P2 prolapse. Now improving with diuresis.  Assessment & Plan    1. Acute respiratory failure in the setting of at least moderate-to-severe  MR with severe P2 prolapse:patient has been experiencing worsening fatigue and dyspnea over the past couple of weeks, acutely worse over the past couple of days. CXR with evidence of  pulmonary edema. TEE with severe P2 prolapse, possible cleft and at least moderate-to-severe eccentric MR. Now clinically improving with IV lasiz -- Transition to lasix 40mg  PO daily -- Not on any nodal agents or vasodilators; HR and BP controlled -- May benefit from Mitra-Clip depending on valve anatomy--plan to follow-up with primary cardiologist, Dr. and structural as out-patient -- Low Na diet -- Monitor I/Os  2. 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 HTN:  Well controlled. Off medications -Continue to monitor  For questions or updates, please contact [facility] Please consult [email or web address] for contact info under        Signed, Flora Lipps, MD  05/24/2020, 9:10 AM

## 2020-05-24 NOTE — Social Work (Addendum)
CSW met with pt/daughter/son at bedside. Pt/daughter/son confirmed pt from [facility]. Pt's daughter reports she is able to transport at discharge.   CSW spoke with Levada Dy [employer] [phone]). Levada Dy reports pt will need FL2 sent for review and COVID test before they can confirm pt can return today. Levada Dy reports latest pt can return is 7pm.  CSW updated RN about discharge status and needs.    Update 2:11pm COVID test negative. CSW attempted to reach [place] at [facility] x2 to check if pt able to return today. CSW spoke with Pam both times who reports Levada Dy unavailable. CSW left number for Levada Dy to callback  Update 3:30 SW spoke with Butch Penny at [facility] reports Levada Dy has left for the day. Butch Penny reports FL2 needs signature before they can accept. Call report: [phone]  [facility] will continue to follow for discharge needs.

## 2020-05-24 NOTE — Progress Notes (Signed)
Patient is going to assist living at [facility] by his family. Covid test was negative and gave report to Seth Bake RN at [facility]. Removed PIV access x 1 and patient and family member (Son & daughter) received discharge instructions. They understood it very well. Pt's going to leave after having dinner. HS [employer]

## 2020-05-24 NOTE — Discharge Summary (Addendum)
Discharge Summary    Patient ID: Adrian [name] MRN: [id number]; DOB: Jan 28, 1928  Admit date: 05/21/2020 Discharge date: 05/24/2020  PCP:  Almetta Lovely, [facility]  Cardiologist:  Reatha Harps, MD   Discharge Diagnoses    Principal Problem:   Severe mitral regurgitation Active Problems:   CHF (congestive heart failure) (HCC)   Acute respiratory failure (HCC)   Chronic diastolic heart failure (HCC)   Persistent atrial fibrillation (HCC)   ICH (intracerebral hemorrhage) (HCC)    Diagnostic Studies/Procedures    ECHO 05/21/20:  1. Left ventricular ejection fraction, by estimation, is 55 to 60%. The  left ventricle has normal function. The left ventricle has no regional  wall motion abnormalities. There is moderate left ventricular hypertrophy.  Left ventricular diastolic  parameters are indeterminate.   2. Right ventricular systolic function is mildly reduced. The right  ventricular size is moderately enlarged.   3. Left atrial size was mildly dilated.   4. Right atrial size was mildly dilated.   5. Moderate pleural effusion in the left lateral region.   6. Prolapse of the posterior MV leaflet with very eccentric anteriorally  directed MR. The eccentricity of the jet may lead to underestimation of  MR. The MV/AV VTI ratio is around 1 suggesting moderate MR. . The mitral  valve is abnormal. Moderate mitral  valve regurgitation. No evidence of mitral stenosis.   7. Tricuspid valve regurgitation is mild to moderate.   8. The aortic valve is tricuspid. There is moderate calcification of the  aortic valve. There is moderate thickening of the aortic valve. Aortic  valve regurgitation is mild.   9. Moderate pulmonary HTN, PASP is 57 mmHg.  10. The inferior vena cava is dilated in size with <50% respiratory  variability, suggesting right atrial pressure of 15 mmHg.   _____________  TEE 05/22/20: IMPRESSIONS   1. Left ventricular  ejection fraction, by estimation, is 55 to 60%. The  left ventricle has normal function.   2. Right ventricular systolic function is normal. The right ventricular  size is normal.   3. No left atrial/left atrial appendage thrombus was detected.   4. There is severe prolapse of P2 leaflet. 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 The MR jet is  very eccentric and difficult to quantitate, but favor severe  regurgitation.. The mitral valve is  abnormal. Moderate to severe mitral valve regurgitation. No evidence of  mitral stenosis.   5. Tricuspid valve regurgitation is mild to moderate.   6. The aortic valve is tricuspid. Aortic valve regurgitation is mild. No  aortic stenosis is present.   Conclusion(s)/Recommendation(s): Findings and images discussed with Drs.  Skains and O'Neal. 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 Very eccentric MR jet. Given anatomy of valve  and appearance of jet, favor severe  eccentric mitral regurgitation. Of note, he was hypotensive and required  phenylephrine during anesthesia. This may also affect the appearance of  mitral regurgitation, underestimating it on these images.    History of Present Illness     Adrian [name] is a 87 y.o. male with persistent atrial fibrillation, HTN, hx of remote ICH on coumadin, GERD, and OSA.   He recently relocated from [facility] to [place] last summer. Reported moving closer to be near his daughter after his wife passed away. He is a retired Art gallery manager. Previously followed through  [facility]. Had a stress test and echocardiogram back 11/2010 which was normal. Wore a monitor which showed 1% A.fib burden.    Had a near syncopal episode back in at the beginning of February with notable hypotension and seen in the ED. Felt to be related to poor PO intake in the setting of Afib RVR. Was seen in the office the following day and  noted still in Afib but rate controlled at 84. Unable to be on anticoagulation 2/2 ICH while on coumadin 16 years ago. The patient also expressed no desire at this office visit to resume. He was started on metoprolol 12.5mg  BID for rate control and instructed to maintain adequate hydration.    Presented back to the office on 2/24 seen by Oris Drone NP with increasing fatigue over the past 2 weeks. Said he had increased his PO intake and blood pressures had been better along with HR. His metoprolol was stopped with concern it was contributing to his fatigue. No complaints of chest pain or dyspnea.   Family called the office 3/9 stating he was seen by his PCP and needed an appt as soon as possible with cardiology. Attempted to move up his office appt. Did have a CXR and echo completed today. CXR showed right upper lobe infiltrate. Echo demonstrated severe mitral regurgitation, concern for ruptured cord of posterior mitral valve leaflet. He was called by Dr. Flora Lipps and instructed to come to the ED for further management.    In the ED his labs showed Na+ 124, K+ 4.4, Cr 0.95, WBC 8.8, Hgb 10.8, TSH 4.19. EKG showed Afib 88bpm with PVC.    Hospital Course     Consultants:   Acute respiratory failure Severe MR with P2 prolapse Chronic diastolic heart failure He presented to [facility] with worsening dyspnea and fatigue. He underwent TEE which showed severe prolapse of P2 with possible cleft and at least moderate to severe MR and mild to moderate TR. Case was discussed with Dr. Excell Seltzer for possible mitraclip. He may need a repeat TEE +/- right and left heart cath.   He was diuresed with IV lasix and transitioned to PO lasix prior to discharge. Will discharge on 40 mg lasix daily. Will give one dose of 20 mEq potassium prior to discharge. Anticipate BMP on Tues at follow up.    Persistent atrial fibrillation He is not a candidate for oral anticoagulation given his history of ICH in the setting of coumadin  (2012). He is rate controlled without AV nodal agents. Continue 81 mg ASA   Hypertension Well-controlled, no home medications.   Pt seen and examined by Dr. Shari Prows and deemed stable for discharge.   Did the patient have an acute coronary syndrome (MI, NSTEMI, STEMI, etc) this admission?:  No                               Did the patient have a percutaneous coronary intervention (stent / angioplasty)?:  No.       _____________  Discharge Vitals Blood pressure 107/65, pulse 76, temperature 97.8 F (36.6 C), temperature source Oral, resp. rate 16, height  (1.702 m), weight 70.3 kg, SpO2 95 %.  Filed Weights   05/22/20 0600 05/22/20 1156  Weight: 75 kg 70.3 kg    Labs & Radiologic Studies    CBC Recent Labs    05/21/20 2044 05/22/20 0548  WBC 9.3 7.7  HGB 11.5* 11.0*  HCT 33.5*  32.0*  MCV 89.3 88.9  PLT 375 331   Basic Metabolic Panel Recent Labs    37/85/88 0750 05/24/20 0120  NA 128* 127*  K 3.7 3.8  CL 92* 90*  CO2 23 28  GLUCOSE 93 98  BUN 19 22  CREATININE 1.00 1.20  CALCIUM 8.8* 8.3*   Liver Function Tests Recent Labs    05/21/20 2044  AST 40  ALT 42  ALKPHOS 128*  BILITOT 0.8  PROT 6.3*  ALBUMIN 3.3*   No results for input(s): LIPASE, AMYLASE in the last 72 hours. High Sensitivity Troponin:   Recent Labs  Lab 05/21/20 1600 05/21/20 1825  TROPONINIHS 18* 17    BNP Invalid input(s): POCBNP D-Dimer No results for input(s): DDIMER in the last 72 hours. Hemoglobin A1C No results for input(s): HGBA1C in the last 72 hours. Fasting Lipid Panel No results for input(s): CHOL, HDL, LDLCALC, TRIG, CHOLHDL, LDLDIRECT in the last 72 hours. Thyroid Function Tests No results for input(s): TSH, T4TOTAL, T3FREE, THYROIDAB in the last 72 hours.  Invalid input(s): FREET3 _____________  DG Chest 2 View  Result Date: 05/21/2020 CLINICAL DATA:  Shortness of breath and fatigue EXAM: CHEST - 2 VIEW COMPARISON:  None. FINDINGS: There is airspace  opacity in the anterior segment right upper lobe consistent with pneumonia. There is consolidation in the posteromedial left base consistent with a second focus of pneumonia. The lungs elsewhere are clear. Heart is upper normal in size with pulmonary vascularity within normal limits. No adenopathy. There is aortic atherosclerosis. Postoperative change noted in the right shoulder. There is degenerative change in each shoulder with superior migration of each humeral head. IMPRESSION: Areas of apparent pneumonia in the anterior segment right upper lobe and in the posteromedial left base region. Heart size within normal limits. No adenopathy. Aortic Atherosclerosis (ICD10-I70.0). Suspect chronic rotator cuff tears bilaterally. These results will be called to the ordering clinician or representative by the Radiologist Assistant, and communication documented in the PACS or [employer]. Electronically Signed   By: Bretta Bang III M.D.   On: 05/21/2020 11:17   ECHOCARDIOGRAM COMPLETE  Result Date: 05/21/2020    ECHOCARDIOGRAM REPORT   Patient Name:   Adrian [name] Date of Exam: 05/21/2020 Medical Rec #:  [id number]      Height:       64.0 in Accession #:    [id number]     Weight:       162.2 lb Date of Birth:  October 14, 1927      BSA:          1.790 m Patient Age:    87 years       BP:           132/74 mmHg Patient Gender: M              HR:           73 bpm. Exam Location:  [place] Procedure: 2D Echo, Cardiac Doppler and Color Doppler Indications:    R06.02 SOB; R06.9 DOE; R53.83 Fatigue  History:        Patient has no prior history of Echocardiogram examinations and                 Patient has prior history of Echocardiogram examinations, most                 recent 11/19/2019. History of intracranial hemorrhage.,  Arrythmias:Atrial Fibrillation, Signs/Symptoms:Shortness of                 Breath and Dyspnea; Risk Factors:Hypertension. 04/22/20 ER                 visit, he was found to be in A-fib with  RVR and hypotensive. It                 was felt that he was dehydrated.                 SOB has been steadily worsening.  Sonographer:    Vella Kohler BS, RVT, RDCS Referring Phys: [phone] Ronnald Ramp O'NEAL IMPRESSIONS  1. Left ventricular ejection fraction, by estimation, is 55 to 60%. The left ventricle has normal function. The left ventricle has no regional wall motion abnormalities. There is moderate left ventricular hypertrophy. Left ventricular diastolic parameters are indeterminate.  2. Right ventricular systolic function is mildly reduced. The right ventricular size is moderately enlarged.  3. Left atrial size was mildly dilated.  4. Right atrial size was mildly dilated.  5. Moderate pleural effusion in the left lateral region.  6. 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 The eccentricity of the jet may lead to underestimation of MR. The MV/AV VTI ratio is around 1 suggesting moderate MR. . The mitral valve is abnormal. Moderate mitral valve regurgitation. No evidence of mitral stenosis.  7. Tricuspid valve regurgitation is mild to moderate.  8. The aortic valve is tricuspid. There is moderate calcification of the aortic valve. There is moderate thickening of the aortic valve. Aortic valve regurgitation is mild.  9. Moderate pulmonary HTN, PASP is 57 mmHg. 10. The inferior vena cava is dilated in size with <50% respiratory variability, suggesting right atrial pressure of 15 mmHg. Comparison(s): Prior Echo at [facility] showed LV EF 60-65%, no RWMA, mild LVH, mild LAE, tricuspid AoV with mild AI, mild MR. FINDINGS  Left Ventricle: LVOT VTI appears underestimated. Left ventricular ejection fraction, by estimation, is 55 to 60%. The left ventricle has normal function. The left ventricle has no regional wall motion abnormalities. Global longitudinal strain performed but not reported based on interpreter judgement due to suboptimal tracking. The left ventricular  internal cavity size was normal in size. There is moderate left ventricular hypertrophy. Left ventricular diastolic parameters are indeterminate. Right Ventricle: The right ventricular size is moderately enlarged. No increase in right ventricular wall thickness. Right ventricular systolic function is mildly reduced. Left Atrium: Left atrial size was mildly dilated. Right Atrium: Right atrial size was mildly dilated. Pericardium: There is no evidence of pericardial effusion. 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 The eccentricity of the jet may lead to underestimation of MR. The MV/AV VTI ratio is around 1 suggesting moderate MR. The mitral valve is abnormal. Moderate  mitral valve regurgitation. No evidence of mitral valve stenosis. Tricuspid Valve: The tricuspid valve is normal in structure. Tricuspid valve regurgitation is mild to moderate. No evidence of tricuspid stenosis. Aortic Valve: The aortic valve is tricuspid. There is moderate calcification of the aortic valve. There is moderate thickening of the aortic valve. There is moderate aortic valve annular calcification. Aortic valve regurgitation is mild. Aortic regurgitation PHT measures 688 msec. Aortic valve mean gradient measures 5.7 mmHg. Aortic valve peak gradient measures 13.0 mmHg. Aortic valve area, by VTI measures 1.70 cm. Pulmonic Valve: The pulmonic valve was not well visualized. Pulmonic valve  regurgitation is not visualized. No evidence of pulmonic stenosis. Aorta: The aortic root is normal in size and structure. Pulmonary Artery: Moderate pulmonary HTN, PASP is 57 mmHg. Venous: The inferior vena cava is dilated in size with less than 50% respiratory variability, suggesting right atrial pressure of 15 mmHg. IAS/Shunts: The interatrial septum was not well visualized. Additional Comments: There is a moderate pleural effusion in the left lateral region.  LEFT VENTRICLE PLAX 2D LVIDd:          3.49 cm     Diastology LVIDs:         2.56 cm     LV e' medial:    8.92 cm/s LV PW:         1.43 cm     LV E/e' medial:  19.2 LV IVS:        1.56 cm     LV e' lateral:   9.14 cm/s LVOT diam:     2.30 cm     LV E/e' lateral: 18.7 LV SV:         52 LV SV Index:   29 LVOT Area:     4.15 cm  LV Volumes (MOD) LV vol d, MOD A2C: 72.1 ml LV vol d, MOD A4C: 64.3 ml LV vol s, MOD A2C: 31.2 ml LV vol s, MOD A4C: 25.6 ml LV SV MOD A2C:     40.9 ml LV SV MOD A4C:     64.3 ml LV SV MOD BP:      43.4 ml RIGHT VENTRICLE RV Basal diam:  3.98 cm RV Mid diam:    2.62 cm RV S prime:     14.70 cm/s TAPSE (M-mode): 1.5 cm LEFT ATRIUM             Index       RIGHT ATRIUM           Index LA diam:        4.60 cm 2.57 cm/m  RA Area:     19.30 cm LA Vol (A2C):   63.6 ml 35.54 ml/m RA Volume:   51.80 ml  28.94 ml/m LA Vol (A4C):   51.5 ml 28.78 ml/m LA Biplane Vol: 58.1 ml 32.46 ml/m  AORTIC VALVE                    PULMONIC VALVE AV Area (Vmax):    1.86 cm     PV Vmax:       1.07 m/s AV Area (Vmean):   2.10 cm     PV Peak grad:  4.6 mmHg AV Area (VTI):     1.70 cm AV Vmax:           180.16 cm/s AV Vmean:          110.806 cm/s AV VTI:            0.308 m AV Peak Grad:      13.0 mmHg AV Mean Grad:      5.7 mmHg LVOT Vmax:         80.60 cm/s LVOT Vmean:        56.100 cm/s LVOT VTI:          0.126 m LVOT/AV VTI ratio: 0.41 AI PHT:            688 msec AR Vena Contracta: 0.40 cm  AORTA Ao Root diam: 3.70 cm Ao Asc diam:  3.40 cm MR Peak grad:    98.4 mmHg   TRICUSPID  VALVE MR Mean grad:    66.0 mmHg   TR Peak grad:   42.5 mmHg MR Vmax:         496.00 cm/s TR Vmax:        326.00 cm/s MR Vmean:        387.0 cm/s MR PISA:         1.57 cm    SHUNTS MR PISA Eff ROA: 7 mm       Systemic VTI:  0.13 m MR PISA Radius:  0.50 cm     Systemic Diam: 2.30 cm MV E velocity: 171.00 cm/s MV A velocity: 35.80 cm/s MV E/A ratio:  4.78 Dina Rich MD Electronically signed by Dina Rich MD Signature Date/Time: 05/21/2020/10:45:05 AM    Final     ECHO TEE  Result Date: 05/22/2020    TRANSESOPHOGEAL ECHO REPORT   Patient Name:   Adrian [name] Date of Exam: 05/22/2020 Medical Rec #:  [id number]      Height:       67.0 in Accession #:    [id number]     Weight:       155.0 lb Date of Birth:  02-Oct-1927      BSA:          1.815 m Patient Age:    87 years       BP:           159/102 mmHg Patient Gender: M              HR:           66 bpm. Exam Location:  Inpatient Procedure: Transesophageal Echo and 3D Echo Indications:     Mitral valve disease  History:         Patient has prior history of Echocardiogram examinations, most                  recent 05/21/2020. Risk Factors:Non-Smoker and Sleep Apnea.  Sonographer:     Renella Cunas RDCS Referring Phys:  [id number] BRIDGETTE CHRISTOPHER Diagnosing Phys: Jodelle Red MD PROCEDURE: The transesophogeal probe was passed without difficulty through the esophogus of the patient. Sedation performed by different physician. The patient was monitored while under deep sedation. Anesthestetic sedation was provided intravenously by Anesthesiology: 294.  of Propofol. The patient developed no complications during the procedure. IMPRESSIONS  1. Left ventricular ejection fraction, by estimation, is 55 to 60%. The left ventricle has normal function.  2. Right ventricular systolic function is normal. The right ventricular size is normal.  3. No left atrial/left atrial appendage thrombus was detected.  4. There is severe prolapse of P2 leaflet. 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 The MR jet is very eccentric and difficult to quantitate, but favor severe regurgitation.. The mitral valve is abnormal. Moderate to severe mitral valve regurgitation. No evidence of mitral stenosis.  5. Tricuspid valve regurgitation is mild to moderate.  6. The aortic valve is tricuspid. Aortic valve regurgitation is mild. No aortic stenosis is present. Conclusion(s)/Recommendation(s): Findings  and images discussed with Drs. Skains and O'Neal. 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 Very eccentric MR jet. Given anatomy of valve and appearance of jet, favor severe eccentric mitral regurgitation. Of note, he was hypotensive and required phenylephrine during anesthesia. This may also affect the appearance of mitral regurgitation, underestimating it on these images. FINDINGS  Left Ventricle: Left ventricular ejection fraction, by estimation, is  55 to 60%. The left ventricle has normal function. The left ventricular internal cavity size was normal in size. Right Ventricle: The right ventricular size is normal. No increase in right ventricular wall thickness. Right ventricular systolic function is normal. Left Atrium: Left atrial size was not assessed. No left atrial/left atrial appendage thrombus was detected. Right Atrium: Right atrial size was not assessed. Pericardium: Trivial pericardial effusion is present. Mitral Valve: There is severe prolapse of P2 leaflet. 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 The MR jet is very eccentric and difficult to quantitate, but favor severe regurgitation. The mitral valve is  abnormal. Moderate to severe mitral valve regurgitation. No evidence of mitral valve stenosis. Tricuspid Valve: The tricuspid valve is normal in structure. Tricuspid valve regurgitation is mild to moderate. Aortic Valve: The aortic valve is tricuspid. Aortic valve regurgitation is mild. No aortic stenosis is present. Pulmonic Valve: The pulmonic valve was normal in structure. Pulmonic valve regurgitation is mild. Aorta: The aortic root and ascending aorta are structurally normal, with no evidence of dilitation. IAS/Shunts: No atrial level shunt detected by color flow Doppler. Jodelle Red MD Electronically signed by Jodelle Red MD Signature Date/Time: 05/22/2020/3:47:24 PM    Final     Disposition   Pt is being discharged home today in good condition.  Follow-up Plans & Appointments     Follow-up Information     O'Neal, Ronnald Ramp, MD Follow up on 05/26/2020.   Specialties: Internal Medicine, Cardiology, Radiology Why: 10:00 am Contact information: [address] [place] [address] [phone]         Tonny Bollman, MD Follow up.   Specialty: Cardiology Why: Office will call with appt Contact information: [address] [place] [address] [phone]                Discharge Instructions     Diet - low sodium heart healthy   Complete by: As directed    Increase activity slowly   Complete by: As directed        Discharge Medications   Allergies as of 05/24/2020       Reactions   Milk Protein Other (See Comments)   Causes runny nose and sneezing         Medication List     TAKE these medications    acetaminophen 500 MG tablet Commonly known as: TYLENOL Take 500 mg by mouth in the morning, at noon, and at bedtime.   aspirin 81 MG EC tablet Take 81 mg by mouth daily.   Calcium 500 + D3 500-200 MG-UNIT Tabs Generic drug: Calcium Carb-Cholecalciferol Take 1 tablet by mouth in the morning and at bedtime.   esomeprazole 20 MG capsule Commonly known as: NEXIUM Take 20 mg by mouth daily.   fluticasone 50 MCG/ACT nasal spray Commonly known as: FLONASE Place 1 spray into both nostrils in the morning and at bedtime.   furosemide 40 MG tablet Commonly known as: LASIX Take 1 tablet (40 mg total) by mouth daily. Start taking on: May 25, 2020   guaifenesin 400 MG Tabs tablet Commonly known as: HUMIBID E Take by mouth.   montelukast 10 MG tablet Commonly known as: SINGULAIR Take 10 mg by mouth at bedtime.   multivitamin tablet Take 1 tablet by mouth daily.   nitroGLYCERIN 0.4 MG SL tablet Commonly known as: NITROSTAT Place 1 tablet (0.4 mg total) under the tongue every 5 (five)  minutes  x 3 doses as needed for chest pain.   PRESERVISION AREDS PO Take by mouth.   rivastigmine 3 MG capsule Commonly known as: EXELON Take 3 mg by mouth 2 (two) times daily.   Saw Palmetto 500 MG Caps Take by mouth.   sertraline 50 MG tablet Commonly known as: ZOLOFT Take 25 mg by mouth daily.           Outstanding Labs/Studies   BMP  Follow up with structural heart team  Duration of Discharge Encounter   Greater than 30 minutes including physician time.  Signed, Roe Rutherfordngela Nicole Duke, PA 05/24/2020, 11:58 AM   Patient seen and examined and agree with Micah FlesherAngela Duke, PA as above. Please see progress note from the day for further details. Will plan on further work-up of severe MR secondary to MVP as out-patient with Dr. Flora Lipps'Neal and Structural Heart Team.  Laurance FlattenHeather Chase Arnall, MD

## 2020-05-24 NOTE — [place] FL2 (Addendum)
[place] MEDICAID FL2 LEVEL OF CARE SCREENING TOOL     IDENTIFICATION  Patient Name: Adrian [name] Birthdate: Feb 23, 1928 Sex: male Admission Date (Current Location): 05/21/2020  [facility] and [place] Number:  Producer, television/film/video and Address:  [facility] [place], [address], [place], [place] [address]      Provider Number: [id number]  Attending Physician Name and Address:  Jake Bathe, MD  Relative Name and Phone Number:  Gerlene Burdock [phone]    Current Level of Care: Hospital Recommended Level of Care: Assisted Living Facility Prior Approval Number:    Date Approved/Denied:   PASRR Number:    Discharge Plan: Other (Comment) (ALF)    Current Diagnoses: Patient Active Problem List   Diagnosis Date Noted  . Mitral valve insufficiency   . CHF (congestive heart failure) (HCC) 05/21/2020    Orientation RESPIRATION BLADDER Height & Weight     Self,Time,Situation,Place  O2,Other (Comment) (CPAP 2L) External catheter Weight: 155 lb (70.3 kg) Height:  5\' 7"  (170.2 cm)  BEHAVIORAL SYMPTOMS/MOOD NEUROLOGICAL BOWEL NUTRITION STATUS      Continent Diet (See discharge summary)  AMBULATORY STATUS COMMUNICATION OF NEEDS Skin   Limited Assist Verbally Other (Comment) (scratch marks on buttocks)                       Personal Care Assistance Level of Assistance  Bathing,Dressing,Feeding Bathing Assistance: Limited assistance Feeding assistance: Limited assistance Dressing Assistance: Limited assistance     Functional Limitations Info  Hearing,Sight Sight Info: Impaired Hearing Info: Impaired (uses hearing aids)      SPECIAL CARE FACTORS FREQUENCY  PT (By licensed PT),OT (By licensed OT)     PT Frequency: 5x/wk OT Frequency: 5x/wk            Contractures Contractures Info: Not present    Additional Factors Info  Code Status,Allergies Code Status Info: DNR Allergies Info: milk protein           Current Medications  (05/24/2020):  This is the current hospital active medication list Current Facility-Administered Medications  Medication Dose Route Frequency Provider Last Rate Last Admin  . acetaminophen (TYLENOL) tablet 650 mg  650 mg Oral Q4H PRN 05/26/2020, MD   650 mg at 05/23/20 1755  . aspirin EC tablet 81 mg  81 mg Oral Daily 07/23/20, MD   81 mg at 05/24/20 0942  . Chlorhexidine Gluconate Cloth 2 % PADS 6 each  6 each Topical Daily 05/26/20, MD   6 each at 05/23/20 1000  . diclofenac Sodium (VOLTAREN) 1 % topical gel 2 g  2 g Topical BID 07/23/20, MD   2 g at 05/24/20 0932  . fluticasone (FLONASE) 50 MCG/ACT nasal spray 1 spray  1 spray Each Nare BID 05/26/20, MD   1 spray at 05/24/20 0931  . [START ON 05/25/2020] furosemide (LASIX) tablet 40 mg  40 mg Oral Daily [place], 05/27/2020, MD      . nitroGLYCERIN (NITROSTAT) SL tablet 0.4 mg  0.4 mg Sublingual Q5 Min x 3 PRN Kathlynn Grate, MD      . ondansetron [facility]) injection 4 mg  4 mg Intravenous Q6H PRN [facility], MD      . pantoprazole (PROTONIX) EC tablet 40 mg  40 mg Oral Daily Jodelle Red, MD   40 mg at 05/24/20 0942  . sertraline (ZOLOFT) tablet 25 mg  25 mg Oral Daily 05/26/20, MD   25 mg  at 05/24/20 3762     Discharge Medications: Please see discharge summary for a list of discharge medications.  Relevant Imaging Results:  Relevant Lab Results:   Additional Information SSN: [id number]  Marya Landry Duffy, LCSWA

## 2020-05-25 ENCOUNTER — Telehealth: Payer: Self-pay | Admitting: Cardiovascular Disease

## 2020-05-25 ENCOUNTER — Encounter (HOSPITAL_COMMUNITY): Payer: Self-pay | Admitting: Cardiology

## 2020-05-25 NOTE — Telephone Encounter (Signed)
Spoke to patient's son. He was advised to contact our office regarding his father's hands. He states they are really cold this morning. He does have a headache which is common for him but he did ask for his tylenol this morning. He took a nice walk today with minimal SOB. He see's Dr. Flora Lipps tomorrow for SOB. Denies any other symptoms like chest pain or dizziness.

## 2020-05-25 NOTE — Telephone Encounter (Signed)
Contacted son. He states that the NP came in to see his dad today- and tried to get his oxygen levels, but it would only read (if at all) in the 80's. He stated that the NP told him to contact us as his hands were very cold, and could mean something was going on. I did ask son if patient had any color changes to hands, son denies. He states his dad is breathing better, not presenting with any trouble breathing at this time, no complaints of any symptoms at this time. I advised that normally with cold hands it may not read correctly,and to try to warm his hands up a bit and then try to get another reading in a little while. Son verbalized understanding he will call me with an update later this afternoon. Advised to keep appointment for tomorrow for now. Patient son verbalized thanks for call back, verbalized understanding of plan.   Will route to MD to keep him updated.

## 2020-05-25 NOTE — Telephone Encounter (Signed)
If his O2 still reads in the 80s, he needs to go to the ER. Especially if short of breath. Did they try multiple fingers?  Gerri Spore T. Flora Lipps, MD, [facility]  [facility]  [address], [address] [place], [place] [address] [phone]  2:15 PM

## 2020-05-25 NOTE — Progress Notes (Deleted)
Cardiology Office Note:   Date:  05/25/2020  NAME:  Adrian [name]    MRN: [id number] DOB:  1927-08-22   PCP:  Almetta Lovely, [facility]  Cardiologist:  Reatha Harps, MD  Electrophysiologist:  None   Referring MD: No ref. provider found   No chief complaint on file. ***  History of Present Illness:   Adrian [name] is a 87 y.o. male with a hx of persistent Afib, severe MR, pulmonary hypertension w  Problem List 1. Severe MR -flail P2 segment 05/21/2020 2. Pulmonary hypertension  3. Intracranial hemorrhage  -while on coumadin in past 4. HTN 5. Persistent Afib  -CHADSVASC=3 (age, HTN) -EF 60-65% -normal MPI 11/2019 ([facility]) 6. OSA  Past Medical History: Past Medical History:  Diagnosis Date  . Arrhythmia   . GERD (gastroesophageal reflux disease)   . Intracranial hemorrhage (HCC)   . OSA (obstructive sleep apnea)     Past Surgical History: Past Surgical History:  Procedure Laterality Date  . CRANIOTOMY    . SHOULDER SURGERY    . TEE WITHOUT CARDIOVERSION N/A 05/22/2020   Procedure: TRANSESOPHAGEAL ECHOCARDIOGRAM (TEE);  Surgeon: Jodelle Red, MD;  Location: [facility] ENDOSCOPY;  Service: Cardiovascular;  Laterality: N/A;    Current Medications: No outpatient medications have been marked as taking for the 05/26/20 encounter (Appointment) with O'Neal, Ronnald Ramp, MD.     Allergies:    Milk protein   Social History: Social History   Socioeconomic History  . Marital status: Widowed    Spouse name: Not on file  . Number of children: 7  . Years of education: Not on file  . Highest education level: Not on file  Occupational History  . Occupation: retired Immunologist  Tobacco Use  . Smoking status: Never Smoker  . Smokeless tobacco: Never Used  Substance and Sexual Activity  . Alcohol use: Never  . Drug use: Never  . Sexual activity: Not on file  Other Topics Concern  . Not on file  Social History Narrative  . Not on file   Social Determinants  of Health   Financial Resource Strain: Not on file  Food Insecurity: Not on file  Transportation Needs: Not on file  Physical Activity: Not on file  Stress: Not on file  Social Connections: Not on file     Family History: The patient's ***family history includes Cancer in his father.  ROS:   All other ROS reviewed and negative. Pertinent positives noted in the HPI.     EKGs/Labs/Other Studies Reviewed:   The following studies were personally reviewed by me today:  EKG:  EKG is *** ordered today.  The ekg ordered today demonstrates ***, and was personally reviewed by me.   TTE 05/21/2020 1. Left ventricular ejection fraction, by estimation, is 55 to 60%. The  left ventricle has normal function. The left ventricle has no regional  wall motion abnormalities. There is moderate left ventricular hypertrophy.  Left ventricular diastolic  parameters are indeterminate.  2. Right ventricular systolic function is mildly reduced. The right  ventricular size is moderately enlarged.  3. Left atrial size was mildly dilated.  4. Right atrial size was mildly dilated.  5. Moderate pleural effusion in the left lateral region.  6. Prolapse of the posterior MV leaflet with very eccentric anteriorally  directed MR. The eccentricity of the jet may lead to underestimation of  MR. The MV/AV VTI ratio is around 1 suggesting moderate MR. . The mitral  valve is abnormal. Moderate mitral  valve regurgitation.  No evidence of mitral stenosis.  7. Tricuspid valve regurgitation is mild to moderate.  8. The aortic valve is tricuspid. There is moderate calcification of the  aortic valve. There is moderate thickening of the aortic valve. Aortic  valve regurgitation is mild.  9. Moderate pulmonary HTN, PASP is 57 mmHg.  10. The inferior vena cava is dilated in size with <50% respiratory  variability, suggesting right atrial pressure of 15 mmHg.   Recent Labs: 04/23/2020: TSH 4.190 05/21/2020: ALT 42;  B Natriuretic Peptide 834.4 05/22/2020: Hemoglobin 11.0; Platelets 331 05/24/2020: BUN 22; Creatinine, Ser 1.20; Potassium 3.8; Sodium 127   Recent Lipid Panel No results found for: CHOL, TRIG, HDL, CHOLHDL, VLDL, LDLCALC, LDLDIRECT  Physical Exam:   VS:  There were no vitals taken for this visit.   Wt Readings from Last 3 Encounters:  05/22/20 155 lb (70.3 kg)  05/07/20 162 lb 3.2 oz (73.6 kg)  04/23/20 152 lb (68.9 kg)    General: Well nourished, well developed, in no acute distress Head: Atraumatic, normal size  Eyes: PEERLA, EOMI  Neck: Supple, no JVD Endocrine: No thryomegaly Cardiac: Normal S1, S2; RRR; no murmurs, rubs, or gallops Lungs: Clear to auscultation bilaterally, no wheezing, rhonchi or rales  Abd: Soft, nontender, no hepatomegaly  Ext: No edema, pulses 2+ Musculoskeletal: No deformities, BUE and BLE strength normal and equal Skin: Warm and dry, no rashes   Neuro: Alert and oriented to person, place, time, and situation, CNII-XII grossly intact, no focal deficits  Psych: Normal mood and affect   ASSESSMENT:   Adrian [name] is a 87 y.o. male who presents for the following: No diagnosis found.  PLAN:   There are no diagnoses linked to this encounter.  Disposition: No follow-ups on file.  Medication Adjustments/Labs and Tests Ordered: Current medicines are reviewed at length with the patient today.  Concerns regarding medicines are outlined above.  No orders of the defined types were placed in this encounter.  No orders of the defined types were placed in this encounter.   There are no Patient Instructions on file for this visit.   Time Spent with Patient: I have spent a total of *** minutes with patient reviewing hospital notes, telemetry, EKGs, labs and examining the patient as well as establishing an assessment and plan that was discussed with the patient.  > 50% of time was spent in direct patient care.  Signed, Lenna Gilford. Flora Lipps, MD, [facility]   [facility]  [address] [place], [place] [address] [phone]  05/25/2020 2:57 PM

## 2020-05-25 NOTE — Anesthesia Postprocedure Evaluation (Signed)
Anesthesia Post Note  Patient: Adrian [name]  Procedure(s) Performed: TRANSESOPHAGEAL ECHOCARDIOGRAM (TEE) (N/A )     Patient location during evaluation: Endoscopy Anesthesia Type: MAC Level of consciousness: awake and alert Pain management: pain level controlled Vital Signs Assessment: post-procedure vital signs reviewed and stable Respiratory status: spontaneous breathing, nonlabored ventilation, respiratory function stable and patient connected to nasal cannula oxygen Cardiovascular status: blood pressure returned to baseline and stable Postop Assessment: no apparent nausea or vomiting Anesthetic complications: no   No complications documented.  Last Vitals:  Vitals:   05/24/20 1154 05/24/20 1620  BP: (!) 109/93 106/68  Pulse: 91 82  Resp: (!) 23 (!) 23  Temp: (!) 36.3 C (!) 36.4 C  SpO2: 97% 97%    Last Pain:  Vitals:   05/24/20 1620  TempSrc: Oral  PainSc:    Pain Goal: Patients Stated Pain Goal: 3 (05/23/20 1802)                 [name] Estephania Licciardi

## 2020-05-25 NOTE — Telephone Encounter (Signed)
Patient son updated via mychart- see separate message.  O2 is 96%. No distress at this time.

## 2020-05-26 ENCOUNTER — Ambulatory Visit: Payer: Medicare HMO | Admitting: Cardiovascular Disease

## 2020-05-26 ENCOUNTER — Encounter: Payer: Self-pay | Admitting: Cardiovascular Disease

## 2020-05-26 ENCOUNTER — Other Ambulatory Visit: Payer: Self-pay

## 2020-05-26 VITALS — BP 100/60 | HR 72 | Ht 64.0 in | Wt 153.0 lb

## 2020-05-26 DIAGNOSIS — I340 Nonrheumatic mitral (valve) insufficiency: Secondary | ICD-10-CM

## 2020-05-26 DIAGNOSIS — R0602 Shortness of breath: Secondary | ICD-10-CM

## 2020-05-26 DIAGNOSIS — I4819 Other persistent atrial fibrillation: Secondary | ICD-10-CM

## 2020-05-26 DIAGNOSIS — I10 Essential (primary) hypertension: Secondary | ICD-10-CM

## 2020-05-26 NOTE — Progress Notes (Signed)
[facility]   MULTIDISCIPLINARY HEART VALVE TEAM  Date:  05/27/2020   ID:  Adrian [name], DOB 08/12/1927, MRN [id number]  PCP:  Almetta Lovely, [facility]   Chief Complaint  Patient presents with  . Shortness of Breath     HISTORY OF PRESENT ILLNESS: Adrian [name] is a 87 y.o. male who presents for evaluation of mitral regurgitation, referred by Dr Flora Lipps.  The patient is here with his son today.  He has previously been followed by Dr Sloan Leiter for paroxysmal atrial fibrillation. The patient is widowed since last year and has relocated to the area. He has a hx of intracranial hemorrhage on warfarin and required a craniotomy remotely. He has not been anticoagulated since that time.   He reports fairly acute onset of dyspnea and somnolence/weakness/fatigue about 6 to 8 weeks ago.  The patient underwent echo and chest x-ray studies which demonstrated newly diagnosed severe mitral regurgitation and bilateral pulmonary infiltrates suggestive of pneumonia which in retrospect were likely related to acute heart failure.  He was hospitalized and treated with diuretic therapy with clinical improvement in symptoms.  A transesophageal echocardiogram was done and showed prolapse of the posterior leaflet of the mitral valve with what I suspect is a partially flail segment.  He became severely hypotensive during the TEE and the degree of MR may be underestimated because of his hemodynamic state.  Since discharge, he has continued to have some problems with shortness of breath with low-level activity.  He denies orthopnea or PND.  He is having some problems with his nocturnal breathing and oxygen desaturation during naps.  He is now using CPAP with sleep and during daytime naps.   Past Medical History:  Diagnosis Date  . Arrhythmia   . GERD (gastroesophageal reflux disease)   . Intracranial hemorrhage (HCC)   . OSA (obstructive sleep apnea)     Current Outpatient Medications   Medication Sig Dispense Refill  . acetaminophen (TYLENOL) 500 MG tablet Take 500 mg by mouth in the morning, at noon, and at bedtime.    Marland Kitchen aspirin 81 MG EC tablet Take 81 mg by mouth daily.    . Calcium Carb-Cholecalciferol (CALCIUM 500 + D3) 500-200 MG-UNIT TABS Take 1 tablet by mouth in the morning and at bedtime.    Marland Kitchen esomeprazole (NEXIUM) 20 MG capsule Take 20 mg by mouth daily.    . fluticasone (FLONASE) 50 MCG/ACT nasal spray Place 1 spray into both nostrils in the morning and at bedtime.    . furosemide (LASIX) 40 MG tablet Take 1 tablet (40 mg total) by mouth daily. 90 tablet 3  . guaifenesin (HUMIBID E) 400 MG TABS tablet Take by mouth.    . montelukast (SINGULAIR) 10 MG tablet Take 10 mg by mouth at bedtime.    . Multiple Vitamin (MULTIVITAMIN) tablet Take 1 tablet by mouth daily.    . Multiple Vitamins-Minerals (PRESERVISION AREDS PO) Take by mouth.    . nitroGLYCERIN (NITROSTAT) 0.4 MG SL tablet Place 1 tablet (0.4 mg total) under the tongue every 5 (five) minutes x 3 doses as needed for chest pain. 25 tablet 2  . rivastigmine (EXELON) 3 MG capsule Take 3 mg by mouth 2 (two) times daily.    . Saw Palmetto 500 MG CAPS Take by mouth.    . sertraline (ZOLOFT) 25 MG tablet Take 25 mg by mouth daily.     No current facility-administered medications for this visit.    ALLERGIES:   Milk protein  SOCIAL HISTORY:  The patient  reports that he has never smoked. He has never used smokeless tobacco. He reports that he does not drink alcohol and does not use drugs.   FAMILY HISTORY:  The patient's family history includes Cancer in his father.   REVIEW OF SYSTEMS:  Positive for fatigue, weakness.   All other systems are reviewed and negative.   PHYSICAL EXAM: VS:  BP 100/60   Pulse 72   Ht 5' 4" (1.626 m)   Wt 153 lb (69.4 kg)   SpO2 96%   BMI 26.26 kg/m  , BMI Body mass index is 26.26 kg/m. GEN: pleasant elderly male, in no acute distress HEENT: normal Neck: No JVD. carotids  2+ without bruits or masses Cardiac: The heart is irregular with a 3/6 holosystolic murmur only heard at the apex.  Trace bilateral ankle edema. Pedal pulses 2+ = bilaterally  Respiratory:  clear to auscultation bilaterally GI: soft, nontender, nondistended, + BS MS: no deformity or atrophy Skin: warm and dry, no rash Neuro:  Strength and sensation are intact Psych: euthymic mood, full affect  EKG:  EKG from 05/23/2020 reviewed and demonstrates atrial fibrillation 85 bpm, left axis deviation  RECENT LABS: 04/23/2020: TSH 4.190 05/21/2020: ALT 42; B Natriuretic Peptide 834.4 05/22/2020: Hemoglobin 11.0; Platelets 331 05/24/2020: BUN 22; Creatinine, Ser 1.20; Potassium 3.8; Sodium 127  No results found for requested labs within last 8760 hours.   Estimated Creatinine Clearance: 34.3 mL/min (by C-G formula based on SCr of 1.2 mg/dL).   Wt Readings from Last 3 Encounters:  05/26/20 153 lb (69.4 kg)  05/22/20 155 lb (70.3 kg)  05/07/20 162 lb 3.2 oz (73.6 kg)     CARDIAC STUDIES:  Echo:  FINDINGS  Left Ventricle: LVOT VTI appears underestimated. Left ventricular  ejection fraction, by estimation, is 55 to 60%. The left ventricle has  normal function. The left ventricle has no regional wall motion  abnormalities. Global longitudinal strain performed  but not reported based on interpreter judgement due to suboptimal  tracking. The left ventricular internal cavity size was normal in size.  There is moderate left ventricular hypertrophy. Left ventricular diastolic  parameters are indeterminate.   Right Ventricle: The right ventricular size is moderately enlarged. No  increase in right ventricular wall thickness. Right ventricular systolic  function is mildly reduced.   Left Atrium: Left atrial size was mildly dilated.   Right Atrium: Right atrial size was mildly dilated.   Pericardium: There is no evidence of pericardial effusion.   Mitral Valve: Prolapse of the posterior MV  leaflet with very eccentric  anteriorally directed MR. The eccentricity of the jet may lead to  underestimation of MR. The MV/AV VTI ratio is around 1 suggesting moderate  MR. The mitral valve is abnormal. Moderate  mitral valve regurgitation. No evidence of mitral valve stenosis.   Tricuspid Valve: The tricuspid valve is normal in structure. Tricuspid  valve regurgitation is mild to moderate. No evidence of tricuspid  stenosis.   Aortic Valve: The aortic valve is tricuspid. There is moderate  calcification of the aortic valve. There is moderate thickening of the  aortic valve. There is moderate aortic valve annular calcification. Aortic  valve regurgitation is mild. Aortic  regurgitation PHT measures 688 msec. Aortic valve mean gradient measures  5.7 mmHg. Aortic valve peak gradient measures 13.0 mmHg. Aortic valve  area, by VTI measures 1.70 cm.   Pulmonic Valve: The pulmonic valve was not well visualized. Pulmonic valve  regurgitation is   not visualized. No evidence of pulmonic stenosis.   Aorta: The aortic root is normal in size and structure.   Pulmonary Artery: Moderate pulmonary HTN, PASP is 57 mmHg.   Venous: The inferior vena cava is dilated in size with less than 50%  respiratory variability, suggesting right atrial pressure of 15 mmHg.   IAS/Shunts: The interatrial septum was not well visualized.   Additional Comments: There is a moderate pleural effusion in the left  lateral region.    TEE: IMPRESSIONS    1. Left ventricular ejection fraction, by estimation, is 55 to 60%. The  left ventricle has normal function.  2. Right ventricular systolic function is normal. The right ventricular  size is normal.  3. No left atrial/left atrial appendage thrombus was detected.  4. There is severe prolapse of P2 leaflet. 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 The MR jet is  very eccentric and difficult to quantitate,  but favor severe  regurgitation.. The mitral valve is  abnormal. Moderate to severe mitral valve regurgitation. No evidence of  mitral stenosis.  5. Tricuspid valve regurgitation is mild to moderate.  6. The aortic valve is tricuspid. Aortic valve regurgitation is mild. No  aortic stenosis is present.   Conclusion(s)/Recommendation(s): Findings and images discussed with Drs.  Skains and O'Neal. 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 Very eccentric MR jet. Given anatomy of valve  and appearance of jet, favor severe  eccentric mitral regurgitation. Of note, he was hypotensive and required  phenylephrine during anesthesia. This may also affect the appearance of  mitral regurgitation, underestimating it on these images.   STS RISK CALCULATOR: Isolated mitral valve repair: Risk of Mortality: 5.184% Renal Failure: 3.422% Permanent Stroke: 2.951% Prolonged Ventilation: 10.430% DSW Infection: 0.061% Reoperation: 4.863% Morbidity or Mortality: 17.259% Short Length of Stay: 16.581% Long Length of Stay: 10.545%  Isolated mitral valve replacement: Risk of Mortality: 5.618% Renal Failure: 3.064% Permanent Stroke: 1.791% Prolonged Ventilation: 12.422% DSW Infection: 0.117% Reoperation: 6.972% Morbidity or Mortality: 21.225% Short Length of Stay: 11.131% Long Length of Stay: 13.967%   ASSESSMENT AND PLAN: 1.  87 yo male with severe, symptomatic non-rheumatic mitral regurgitation associated with NYHA functional class 3 symptoms of acute on chronic diastolic heart failure.   I have personally reviewed the patient's 2D echo and TEE images today. LV function appears to be relatively well preserved. He has what appears to be severe eccentric mitral regurgitation secondary to prolapse and partial flail of the P2 segment of the posterior leaflet. TEE images are somewhat limited because the patient became hemodynamically unstable during  the procedure with severe hypotension. This may also cause some underestimation of MR severity. There is also evidence of pulmonary HTN with an estimated PASP of 57 mmHg .  The patient's symptoms are somewhat acute and it is possible that his mitral regurgitation has occurred subacutely over the past few months secondary to ruptured chordae tendinae.   I have reviewed the natural history of mitral regurgitation with the patient and their family members who are present today. We have discussed the limitations of medical therapy and the poor prognosis associated with symptomatic mitral regurgitation. We have also reviewed potential treatment options, including palliative medical therapy, conventional surgical mitral valve repair or replacement, and percutaneous mitral valve therapies such as edge-to-edge mitral valve approximation with MitraClip. We discussed treatment options in the context of this patient's specific comorbid medical  conditions. He would not be a surgical candidate in the setting of his advanced age and comorbid conditions. He is frustrated with his quality of life and is very interested in pursuing further evaluation for transcatheter edge to edge repair of the mitral valve. As next steps, I have recommended R/L heart catheterization to assess hemodynamics and coronary anatomy. I have reviewed the risks, indications, and alternatives to cardiac catheterization, possible angioplasty, and stenting with the patient. Risks include but are not limited to bleeding, infection, vascular injury, stroke, myocardial infection, arrhythmia, kidney injury, radiation-related injury in the case of prolonged fluoroscopy use, emergency cardiac surgery, and death. The patient understands the risks of serious complication is 1-2 in 1000 with diagnostic cardiac cath and 1-2% or less with angioplasty/stenting. Following cardiac catheterization, he will be referred for formal surgical consultation as part of a  multidisciplinary approach to his care.   I reviewed the MitraClip procedure with the patient and his  today. The patient is counseled specifically about the risks, indications, and alternatives to percutaneous mitral valve repair with MitraClip. Specific risks include vascular injury, bleeding, infection, arrhythmia, myocardial infarction, stroke, cardiac perforation, cardiac tamponade, device embolization, single leaflet detachment, endocarditis, mitral valve injury, emergency surgery, and death. They understand the risk of serious complication occurs at a rate of approximately 2%. If we proceed with MitraClip, they understand that further TEE imaging will be required at the time of the procedure in order to better define some of the anatomic characteristics of the mitral valve in order to confirm anatomic suitability for edge to edge repair.   Enzo Bi 05/27/2020 6:02 AM     [facility] [address] [place] [address]  [phone] (office) [phone] (fax)

## 2020-05-26 NOTE — H&P (View-Only) (Signed)
[facility]   MULTIDISCIPLINARY HEART VALVE TEAM  Date:  05/27/2020   ID:  Adrian [name], DOB 08/12/1927, MRN [id number]  PCP:  Almetta Lovely, [facility]   Chief Complaint  Patient presents with  . Shortness of Breath     HISTORY OF PRESENT ILLNESS: Adrian [name] is a 87 y.o. male who presents for evaluation of mitral regurgitation, referred by Dr Flora Lipps.  The patient is here with his son today.  He has previously been followed by Dr Sloan Leiter for paroxysmal atrial fibrillation. The patient is widowed since last year and has relocated to the area. He has a hx of intracranial hemorrhage on warfarin and required a craniotomy remotely. He has not been anticoagulated since that time.   He reports fairly acute onset of dyspnea and somnolence/weakness/fatigue about 6 to 8 weeks ago.  The patient underwent echo and chest x-ray studies which demonstrated newly diagnosed severe mitral regurgitation and bilateral pulmonary infiltrates suggestive of pneumonia which in retrospect were likely related to acute heart failure.  He was hospitalized and treated with diuretic therapy with clinical improvement in symptoms.  A transesophageal echocardiogram was done and showed prolapse of the posterior leaflet of the mitral valve with what I suspect is a partially flail segment.  He became severely hypotensive during the TEE and the degree of MR may be underestimated because of his hemodynamic state.  Since discharge, he has continued to have some problems with shortness of breath with low-level activity.  He denies orthopnea or PND.  He is having some problems with his nocturnal breathing and oxygen desaturation during naps.  He is now using CPAP with sleep and during daytime naps.   Past Medical History:  Diagnosis Date  . Arrhythmia   . GERD (gastroesophageal reflux disease)   . Intracranial hemorrhage (HCC)   . OSA (obstructive sleep apnea)     Current Outpatient Medications   Medication Sig Dispense Refill  . acetaminophen (TYLENOL) 500 MG tablet Take 500 mg by mouth in the morning, at noon, and at bedtime.    Marland Kitchen aspirin 81 MG EC tablet Take 81 mg by mouth daily.    . Calcium Carb-Cholecalciferol (CALCIUM 500 + D3) 500-200 MG-UNIT TABS Take 1 tablet by mouth in the morning and at bedtime.    Marland Kitchen esomeprazole (NEXIUM) 20 MG capsule Take 20 mg by mouth daily.    . fluticasone (FLONASE) 50 MCG/ACT nasal spray Place 1 spray into both nostrils in the morning and at bedtime.    . furosemide (LASIX) 40 MG tablet Take 1 tablet (40 mg total) by mouth daily. 90 tablet 3  . guaifenesin (HUMIBID E) 400 MG TABS tablet Take by mouth.    . montelukast (SINGULAIR) 10 MG tablet Take 10 mg by mouth at bedtime.    . Multiple Vitamin (MULTIVITAMIN) tablet Take 1 tablet by mouth daily.    . Multiple Vitamins-Minerals (PRESERVISION AREDS PO) Take by mouth.    . nitroGLYCERIN (NITROSTAT) 0.4 MG SL tablet Place 1 tablet (0.4 mg total) under the tongue every 5 (five) minutes x 3 doses as needed for chest pain. 25 tablet 2  . rivastigmine (EXELON) 3 MG capsule Take 3 mg by mouth 2 (two) times daily.    . Saw Palmetto 500 MG CAPS Take by mouth.    . sertraline (ZOLOFT) 25 MG tablet Take 25 mg by mouth daily.     No current facility-administered medications for this visit.    ALLERGIES:   Milk protein  SOCIAL HISTORY:  The patient  reports that he has never smoked. He has never used smokeless tobacco. He reports that he does not drink alcohol and does not use drugs.   FAMILY HISTORY:  The patient's family history includes Cancer in his father.   REVIEW OF SYSTEMS:  Positive for fatigue, weakness.   All other systems are reviewed and negative.   PHYSICAL EXAM: VS:  BP 100/60   Pulse 72   Ht 5' 4" (1.626 m)   Wt 153 lb (69.4 kg)   SpO2 96%   BMI 26.26 kg/m  , BMI Body mass index is 26.26 kg/m. GEN: pleasant elderly male, in no acute distress HEENT: normal Neck: No JVD. carotids  2+ without bruits or masses Cardiac: The heart is irregular with a 3/6 holosystolic murmur only heard at the apex.  Trace bilateral ankle edema. Pedal pulses 2+ = bilaterally  Respiratory:  clear to auscultation bilaterally GI: soft, nontender, nondistended, + BS MS: no deformity or atrophy Skin: warm and dry, no rash Neuro:  Strength and sensation are intact Psych: euthymic mood, full affect  EKG:  EKG from 05/23/2020 reviewed and demonstrates atrial fibrillation 85 bpm, left axis deviation  RECENT LABS: 04/23/2020: TSH 4.190 05/21/2020: ALT 42; B Natriuretic Peptide 834.4 05/22/2020: Hemoglobin 11.0; Platelets 331 05/24/2020: BUN 22; Creatinine, Ser 1.20; Potassium 3.8; Sodium 127  No results found for requested labs within last 8760 hours.   Estimated Creatinine Clearance: 34.3 mL/min (by C-G formula based on SCr of 1.2 mg/dL).   Wt Readings from Last 3 Encounters:  05/26/20 153 lb (69.4 kg)  05/22/20 155 lb (70.3 kg)  05/07/20 162 lb 3.2 oz (73.6 kg)     CARDIAC STUDIES:  Echo:  FINDINGS  Left Ventricle: LVOT VTI appears underestimated. Left ventricular  ejection fraction, by estimation, is 55 to 60%. The left ventricle has  normal function. The left ventricle has no regional wall motion  abnormalities. Global longitudinal strain performed  but not reported based on interpreter judgement due to suboptimal  tracking. The left ventricular internal cavity size was normal in size.  There is moderate left ventricular hypertrophy. Left ventricular diastolic  parameters are indeterminate.   Right Ventricle: The right ventricular size is moderately enlarged. No  increase in right ventricular wall thickness. Right ventricular systolic  function is mildly reduced.   Left Atrium: Left atrial size was mildly dilated.   Right Atrium: Right atrial size was mildly dilated.   Pericardium: There is no evidence of pericardial effusion.   Mitral Valve: Prolapse of the posterior MV  leaflet with very eccentric  anteriorally directed MR. The eccentricity of the jet may lead to  underestimation of MR. The MV/AV VTI ratio is around 1 suggesting moderate  MR. The mitral valve is abnormal. Moderate  mitral valve regurgitation. No evidence of mitral valve stenosis.   Tricuspid Valve: The tricuspid valve is normal in structure. Tricuspid  valve regurgitation is mild to moderate. No evidence of tricuspid  stenosis.   Aortic Valve: The aortic valve is tricuspid. There is moderate  calcification of the aortic valve. There is moderate thickening of the  aortic valve. There is moderate aortic valve annular calcification. Aortic  valve regurgitation is mild. Aortic  regurgitation PHT measures 688 msec. Aortic valve mean gradient measures  5.7 mmHg. Aortic valve peak gradient measures 13.0 mmHg. Aortic valve  area, by VTI measures 1.70 cm.   Pulmonic Valve: The pulmonic valve was not well visualized. Pulmonic valve  regurgitation is   not visualized. No evidence of pulmonic stenosis.   Aorta: The aortic root is normal in size and structure.   Pulmonary Artery: Moderate pulmonary HTN, PASP is 57 mmHg.   Venous: The inferior vena cava is dilated in size with less than 50%  respiratory variability, suggesting right atrial pressure of 15 mmHg.   IAS/Shunts: The interatrial septum was not well visualized.   Additional Comments: There is a moderate pleural effusion in the left  lateral region.    TEE: IMPRESSIONS    1. Left ventricular ejection fraction, by estimation, is 55 to 60%. The  left ventricle has normal function.  2. Right ventricular systolic function is normal. The right ventricular  size is normal.  3. No left atrial/left atrial appendage thrombus was detected.  4. There is severe prolapse of P2 leaflet. 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 The MR jet is  very eccentric and difficult to quantitate,  but favor severe  regurgitation.. The mitral valve is  abnormal. Moderate to severe mitral valve regurgitation. No evidence of  mitral stenosis.  5. Tricuspid valve regurgitation is mild to moderate.  6. The aortic valve is tricuspid. Aortic valve regurgitation is mild. No  aortic stenosis is present.   Conclusion(s)/Recommendation(s): Findings and images discussed with Drs.  Skains and O'Neal. 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 Very eccentric MR jet. Given anatomy of valve  and appearance of jet, favor severe  eccentric mitral regurgitation. Of note, he was hypotensive and required  phenylephrine during anesthesia. This may also affect the appearance of  mitral regurgitation, underestimating it on these images.   STS RISK CALCULATOR: Isolated mitral valve repair: Risk of Mortality: 5.184% Renal Failure: 3.422% Permanent Stroke: 2.951% Prolonged Ventilation: 10.430% DSW Infection: 0.061% Reoperation: 4.863% Morbidity or Mortality: 17.259% Short Length of Stay: 16.581% Long Length of Stay: 10.545%  Isolated mitral valve replacement: Risk of Mortality: 5.618% Renal Failure: 3.064% Permanent Stroke: 1.791% Prolonged Ventilation: 12.422% DSW Infection: 0.117% Reoperation: 6.972% Morbidity or Mortality: 21.225% Short Length of Stay: 11.131% Long Length of Stay: 13.967%   ASSESSMENT AND PLAN: 1.  87 yo male with severe, symptomatic non-rheumatic mitral regurgitation associated with NYHA functional class 3 symptoms of acute on chronic diastolic heart failure.   I have personally reviewed the patient's 2D echo and TEE images today. LV function appears to be relatively well preserved. He has what appears to be severe eccentric mitral regurgitation secondary to prolapse and partial flail of the P2 segment of the posterior leaflet. TEE images are somewhat limited because the patient became hemodynamically unstable during  the procedure with severe hypotension. This may also cause some underestimation of MR severity. There is also evidence of pulmonary HTN with an estimated PASP of 57 mmHg .  The patient's symptoms are somewhat acute and it is possible that his mitral regurgitation has occurred subacutely over the past few months secondary to ruptured chordae tendinae.   I have reviewed the natural history of mitral regurgitation with the patient and their family members who are present today. We have discussed the limitations of medical therapy and the poor prognosis associated with symptomatic mitral regurgitation. We have also reviewed potential treatment options, including palliative medical therapy, conventional surgical mitral valve repair or replacement, and percutaneous mitral valve therapies such as edge-to-edge mitral valve approximation with MitraClip. We discussed treatment options in the context of this patient's specific comorbid medical  conditions. He would not be a surgical candidate in the setting of his advanced age and comorbid conditions. He is frustrated with his quality of life and is very interested in pursuing further evaluation for transcatheter edge to edge repair of the mitral valve. As next steps, I have recommended R/L heart catheterization to assess hemodynamics and coronary anatomy. I have reviewed the risks, indications, and alternatives to cardiac catheterization, possible angioplasty, and stenting with the patient. Risks include but are not limited to bleeding, infection, vascular injury, stroke, myocardial infection, arrhythmia, kidney injury, radiation-related injury in the case of prolonged fluoroscopy use, emergency cardiac surgery, and death. The patient understands the risks of serious complication is 1-2 in 1000 with diagnostic cardiac cath and 1-2% or less with angioplasty/stenting. Following cardiac catheterization, he will be referred for formal surgical consultation as part of a  multidisciplinary approach to his care.   I reviewed the MitraClip procedure with the patient and his  today. The patient is counseled specifically about the risks, indications, and alternatives to percutaneous mitral valve repair with MitraClip. Specific risks include vascular injury, bleeding, infection, arrhythmia, myocardial infarction, stroke, cardiac perforation, cardiac tamponade, device embolization, single leaflet detachment, endocarditis, mitral valve injury, emergency surgery, and death. They understand the risk of serious complication occurs at a rate of approximately 2%. If we proceed with MitraClip, they understand that further TEE imaging will be required at the time of the procedure in order to better define some of the anatomic characteristics of the mitral valve in order to confirm anatomic suitability for edge to edge repair.   Enzo Bi 05/27/2020 6:02 AM     [facility] [address] [place] [address]  [phone] (office) [phone] (fax)

## 2020-05-26 NOTE — H&P (View-Only) (Signed)
[facility]   MULTIDISCIPLINARY HEART VALVE TEAM  Date:  05/27/2020   ID:  Adrian [name], DOB 08/12/1927, MRN [id number]  PCP:  Almetta Lovely, [facility]   Chief Complaint  Patient presents with  . Shortness of Breath     HISTORY OF PRESENT ILLNESS: Adrian [name] is a 87 y.o. male who presents for evaluation of mitral regurgitation, referred by Dr Flora Lipps.  The patient is here with his son today.  He has previously been followed by Dr Sloan Leiter for paroxysmal atrial fibrillation. The patient is widowed since last year and has relocated to the area. He has a hx of intracranial hemorrhage on warfarin and required a craniotomy remotely. He has not been anticoagulated since that time.   He reports fairly acute onset of dyspnea and somnolence/weakness/fatigue about 6 to 8 weeks ago.  The patient underwent echo and chest x-ray studies which demonstrated newly diagnosed severe mitral regurgitation and bilateral pulmonary infiltrates suggestive of pneumonia which in retrospect were likely related to acute heart failure.  He was hospitalized and treated with diuretic therapy with clinical improvement in symptoms.  A transesophageal echocardiogram was done and showed prolapse of the posterior leaflet of the mitral valve with what I suspect is a partially flail segment.  He became severely hypotensive during the TEE and the degree of MR may be underestimated because of his hemodynamic state.  Since discharge, he has continued to have some problems with shortness of breath with low-level activity.  He denies orthopnea or PND.  He is having some problems with his nocturnal breathing and oxygen desaturation during naps.  He is now using CPAP with sleep and during daytime naps.   Past Medical History:  Diagnosis Date  . Arrhythmia   . GERD (gastroesophageal reflux disease)   . Intracranial hemorrhage (HCC)   . OSA (obstructive sleep apnea)     Current Outpatient Medications   Medication Sig Dispense Refill  . acetaminophen (TYLENOL) 500 MG tablet Take 500 mg by mouth in the morning, at noon, and at bedtime.    Marland Kitchen aspirin 81 MG EC tablet Take 81 mg by mouth daily.    . Calcium Carb-Cholecalciferol (CALCIUM 500 + D3) 500-200 MG-UNIT TABS Take 1 tablet by mouth in the morning and at bedtime.    Marland Kitchen esomeprazole (NEXIUM) 20 MG capsule Take 20 mg by mouth daily.    . fluticasone (FLONASE) 50 MCG/ACT nasal spray Place 1 spray into both nostrils in the morning and at bedtime.    . furosemide (LASIX) 40 MG tablet Take 1 tablet (40 mg total) by mouth daily. 90 tablet 3  . guaifenesin (HUMIBID E) 400 MG TABS tablet Take by mouth.    . montelukast (SINGULAIR) 10 MG tablet Take 10 mg by mouth at bedtime.    . Multiple Vitamin (MULTIVITAMIN) tablet Take 1 tablet by mouth daily.    . Multiple Vitamins-Minerals (PRESERVISION AREDS PO) Take by mouth.    . nitroGLYCERIN (NITROSTAT) 0.4 MG SL tablet Place 1 tablet (0.4 mg total) under the tongue every 5 (five) minutes x 3 doses as needed for chest pain. 25 tablet 2  . rivastigmine (EXELON) 3 MG capsule Take 3 mg by mouth 2 (two) times daily.    . Saw Palmetto 500 MG CAPS Take by mouth.    . sertraline (ZOLOFT) 25 MG tablet Take 25 mg by mouth daily.     No current facility-administered medications for this visit.    ALLERGIES:   Milk protein  SOCIAL HISTORY:  The patient  reports that he has never smoked. He has never used smokeless tobacco. He reports that he does not drink alcohol and does not use drugs.   FAMILY HISTORY:  The patient's family history includes Cancer in his father.   REVIEW OF SYSTEMS:  Positive for fatigue, weakness.   All other systems are reviewed and negative.   PHYSICAL EXAM: VS:  BP 100/60   Pulse 72   Ht 5\' 4"  (1.626 m)   Wt 153 lb (69.4 kg)   SpO2 96%   BMI 26.26 kg/m  , BMI Body mass index is 26.26 kg/m. GEN: pleasant elderly male, in no acute distress HEENT: normal Neck: No JVD. carotids  2+ without bruits or masses Cardiac: The heart is irregular with a 3/6 holosystolic murmur only heard at the apex.  Trace bilateral ankle edema. Pedal pulses 2+ = bilaterally  Respiratory:  clear to auscultation bilaterally GI: soft, nontender, nondistended, + BS MS: no deformity or atrophy Skin: warm and dry, no rash Neuro:  Strength and sensation are intact Psych: euthymic mood, full affect  EKG:  EKG from 05/23/2020 reviewed and demonstrates atrial fibrillation 85 bpm, left axis deviation  RECENT LABS: 04/23/2020: TSH 4.190 05/21/2020: ALT 42; B Natriuretic Peptide 834.4 05/22/2020: Hemoglobin 11.0; Platelets 331 05/24/2020: BUN 22; Creatinine, Ser 1.20; Potassium 3.8; Sodium 127  No results found for requested labs within last 8760 hours.   Estimated Creatinine Clearance: 34.3 mL/min (by C-G formula based on SCr of 1.2 mg/dL).   Wt Readings from Last 3 Encounters:  05/26/20 153 lb (69.4 kg)  05/22/20 155 lb (70.3 kg)  05/07/20 162 lb 3.2 oz (73.6 kg)     CARDIAC STUDIES:  Echo:  FINDINGS  Left Ventricle: LVOT VTI appears underestimated. Left ventricular  ejection fraction, by estimation, is 55 to 60%. The left ventricle has  normal function. The left ventricle has no regional wall motion  abnormalities. Global longitudinal strain performed  but not reported based on interpreter judgement due to suboptimal  tracking. The left ventricular internal cavity size was normal in size.  There is moderate left ventricular hypertrophy. Left ventricular diastolic  parameters are indeterminate.   Right Ventricle: The right ventricular size is moderately enlarged. No  increase in right ventricular wall thickness. Right ventricular systolic  function is mildly reduced.   Left Atrium: Left atrial size was mildly dilated.   Right Atrium: Right atrial size was mildly dilated.   Pericardium: There is no evidence of pericardial effusion.   Mitral Valve: Prolapse of the posterior MV  leaflet with very eccentric  anteriorally directed MR. The eccentricity of the jet may lead to  underestimation of MR. The MV/AV VTI ratio is around 1 suggesting moderate  MR. The mitral valve is abnormal. Moderate  mitral valve regurgitation. No evidence of mitral valve stenosis.   Tricuspid Valve: The tricuspid valve is normal in structure. Tricuspid  valve regurgitation is mild to moderate. No evidence of tricuspid  stenosis.   Aortic Valve: The aortic valve is tricuspid. There is moderate  calcification of the aortic valve. There is moderate thickening of the  aortic valve. There is moderate aortic valve annular calcification. Aortic  valve regurgitation is mild. Aortic  regurgitation PHT measures 688 msec. Aortic valve mean gradient measures  5.7 mmHg. Aortic valve peak gradient measures 13.0 mmHg. Aortic valve  area, by VTI measures 1.70 cm.   Pulmonic Valve: The pulmonic valve was not well visualized. Pulmonic valve  regurgitation is  not visualized. No evidence of pulmonic stenosis.   Aorta: The aortic root is normal in size and structure.   Pulmonary Artery: Moderate pulmonary HTN, PASP is 57 mmHg.   Venous: The inferior vena cava is dilated in size with less than 50%  respiratory variability, suggesting right atrial pressure of 15 mmHg.   IAS/Shunts: The interatrial septum was not well visualized.   Additional Comments: There is a moderate pleural effusion in the left  lateral region.    TEE: IMPRESSIONS    1. Left ventricular ejection fraction, by estimation, is 55 to 60%. The  left ventricle has normal function.  2. Right ventricular systolic function is normal. The right ventricular  size is normal.  3. No left atrial/left atrial appendage thrombus was detected.  4. There is severe prolapse of P2 leaflet. 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 The MR jet is  very eccentric and difficult to quantitate,  but favor severe  regurgitation.. The mitral valve is  abnormal. Moderate to severe mitral valve regurgitation. No evidence of  mitral stenosis.  5. Tricuspid valve regurgitation is mild to moderate.  6. The aortic valve is tricuspid. Aortic valve regurgitation is mild. No  aortic stenosis is present.   Conclusion(s)/Recommendation(s): Findings and images discussed with Drs.  Skains and O'Neal. 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 Very eccentric MR jet. Given anatomy of valve  and appearance of jet, favor severe  eccentric mitral regurgitation. Of note, he was hypotensive and required  phenylephrine during anesthesia. This may also affect the appearance of  mitral regurgitation, underestimating it on these images.   STS RISK CALCULATOR: Isolated mitral valve repair: Risk of Mortality: 5.184% Renal Failure: 3.422% Permanent Stroke: 2.951% Prolonged Ventilation: 10.430% DSW Infection: 0.061% Reoperation: 4.863% Morbidity or Mortality: 17.259% Short Length of Stay: 16.581% Long Length of Stay: 10.545%  Isolated mitral valve replacement: Risk of Mortality: 5.618% Renal Failure: 3.064% Permanent Stroke: 1.791% Prolonged Ventilation: 12.422% DSW Infection: 0.117% Reoperation: 6.972% Morbidity or Mortality: 21.225% Short Length of Stay: 11.131% Long Length of Stay: 13.967%   ASSESSMENT AND PLAN: 1.  87 yo male with severe, symptomatic non-rheumatic mitral regurgitation associated with NYHA functional class 3 symptoms of acute on chronic diastolic heart failure.   I have personally reviewed the patient's 2D echo and TEE images today. LV function appears to be relatively well preserved. He has what appears to be severe eccentric mitral regurgitation secondary to prolapse and partial flail of the P2 segment of the posterior leaflet. TEE images are somewhat limited because the patient became hemodynamically unstable during  the procedure with severe hypotension. This may also cause some underestimation of MR severity. There is also evidence of pulmonary HTN with an estimated PASP of 57 mmHg .  The patient's symptoms are somewhat acute and it is possible that his mitral regurgitation has occurred subacutely over the past few months secondary to ruptured chordae tendinae.   I have reviewed the natural history of mitral regurgitation with the patient and their family members who are present today. We have discussed the limitations of medical therapy and the poor prognosis associated with symptomatic mitral regurgitation. We have also reviewed potential treatment options, including palliative medical therapy, conventional surgical mitral valve repair or replacement, and percutaneous mitral valve therapies such as edge-to-edge mitral valve approximation with MitraClip. We discussed treatment options in the context of this patient's specific comorbid medical  conditions. He would not be a surgical candidate in the setting of his advanced age and comorbid conditions. He is frustrated with his quality of life and is very interested in pursuing further evaluation for transcatheter edge to edge repair of the mitral valve. As next steps, I have recommended R/L heart catheterization to assess hemodynamics and coronary anatomy. I have reviewed the risks, indications, and alternatives to cardiac catheterization, possible angioplasty, and stenting with the patient. Risks include but are not limited to bleeding, infection, vascular injury, stroke, myocardial infection, arrhythmia, kidney injury, radiation-related injury in the case of prolonged fluoroscopy use, emergency cardiac surgery, and death. The patient understands the risks of serious complication is 1-2 in 1000 with diagnostic cardiac cath and 1-2% or less with angioplasty/stenting. Following cardiac catheterization, he will be referred for formal surgical consultation as part of a  multidisciplinary approach to his care.   I reviewed the MitraClip procedure with the patient and his  today. The patient is counseled specifically about the risks, indications, and alternatives to percutaneous mitral valve repair with MitraClip. Specific risks include vascular injury, bleeding, infection, arrhythmia, myocardial infarction, stroke, cardiac perforation, cardiac tamponade, device embolization, single leaflet detachment, endocarditis, mitral valve injury, emergency surgery, and death. They understand the risk of serious complication occurs at a rate of approximately 2%. If we proceed with MitraClip, they understand that further TEE imaging will be required at the time of the procedure in order to better define some of the anatomic characteristics of the mitral valve in order to confirm anatomic suitability for edge to edge repair.   Adrian [name] 05/27/2020 6:02 AM     [facility] [address] [place] [address]  [phone] (office) [phone] (fax)

## 2020-05-26 NOTE — Patient Instructions (Addendum)
LABS AND COVID SCREENING INFORMATION (06/01/20): Please come to [facility] prior to your Covid test for repeat blood work.  We open at 7:30AM. You do not need to be fasting.   You are scheduled for your drive-thru COVID screening on: 06/01/20 between 10AM and 11AM. Pre-Procedural COVID-19 Testing Site [address] [place], [place] [address] You will need to go home after your screening and quarantine until your procedure.   CATHETERIZATION INSTRUCTIONS (06/03/20): You are scheduled for a Cardiac Catheterization on: Wednesday, March 23 with Dr. Earney Hamburg.  1. Please arrive at the [facility] (Main Entrance A) at [facility]: [address] [place], [place] [address] at: 12:30PM (This time is two hours before your procedure to ensure your preparation). Free valet parking service is available. You are allowed ONE visitor in the waiting room during your procedure. Both you and your guest must wear masks. Special note: Every effort is made to have your procedure done on time. Please understand that emergencies sometimes delay scheduled procedures.  2. Diet: Do not eat solid foods after midnight.  You may have clear liquids until 5am upon the day of the procedure.  3. Medication instructions in preparation for your procedure:  1) HOLD LASIX the morning of your procedure  2) MAKE SURE TO TAKE YOUR ASPIRIN the morning of your cath  3) You may take your other medications as directed with sips of water   4. Plan for one night stay--bring personal belongings. 5. Bring a current list of your medications and current insurance cards. 6. You MUST have a responsible person to drive you home. 7. Someone MUST be with you the first 24 hours after you arrive home or your discharge will be delayed. 8. Please wear clothes that are easy to get on and off and wear slip-on shoes.  Thank you for allowing [place] to care for you!   -- [facility]

## 2020-05-27 ENCOUNTER — Encounter: Payer: Self-pay | Admitting: Cardiovascular Disease

## 2020-05-29 NOTE — Progress Notes (Signed)
I think its doable.   Adrian Spore T. Flora Lipps, MD, [facility]  [facility]  [address], [address] [place], [place] [address] [phone]  1:17 PM

## 2020-05-29 NOTE — Progress Notes (Signed)
Adrian [name] DOB 11-28-2027  This looks like a clippable valve. The fossa looks approachable for transseptal puncture in both the SAXB and Bicaval views. The left atrium is large enough for device steering and straddle. The MR jet is eccentric and is caused by leaflet prolapse. The posterior leaflet is measuring over 2 cm in the LVOT view. MVA measures about 4.75cm2 from the Transgastric view. I would like to verify the gradient in the case prior to start. Based on this information, I'd recommend starting with an NTW or XTW and assessing for gradient.

## 2020-06-01 ENCOUNTER — Other Ambulatory Visit: Payer: Self-pay

## 2020-06-01 ENCOUNTER — Other Ambulatory Visit (HOSPITAL_COMMUNITY)
Admission: RE | Admit: 2020-06-01 | Discharge: 2020-06-01 | Disposition: A | Discharge status: Home / Self Care | Payer: Medicare HMO | Source: Ambulatory Visit | Attending: Cardiovascular Disease | Admitting: Cardiovascular Disease

## 2020-06-01 ENCOUNTER — Other Ambulatory Visit: Payer: Medicare HMO | Admitting: *Deleted

## 2020-06-01 DIAGNOSIS — Z01812 Encounter for preprocedural laboratory examination: Secondary | ICD-10-CM | POA: Insufficient documentation

## 2020-06-01 DIAGNOSIS — I340 Nonrheumatic mitral (valve) insufficiency: Secondary | ICD-10-CM

## 2020-06-01 DIAGNOSIS — Z20822 Contact with and (suspected) exposure to covid-19: Secondary | ICD-10-CM | POA: Diagnosis not present

## 2020-06-02 ENCOUNTER — Telehealth: Payer: Self-pay | Admitting: *Deleted

## 2020-06-02 LAB — CBC WITH DIFFERENTIAL/PLATELET
Basophils Absolute: 0.1 10*3/uL (ref 0.0–0.2)
Basos: 1 %
EOS (ABSOLUTE): 0.5 10*3/uL — ABNORMAL HIGH (ref 0.0–0.4)
Eos: 6 %
Hematocrit: 36.8 % — ABNORMAL LOW (ref 37.5–51.0)
Hemoglobin: 12 g/dL — ABNORMAL LOW (ref 13.0–17.7)
Immature Grans (Abs): 0 10*3/uL (ref 0.0–0.1)
Immature Granulocytes: 0 %
Lymphocytes Absolute: 1.1 10*3/uL (ref 0.7–3.1)
Lymphs: 16 %
MCH: 29.9 pg (ref 26.6–33.0)
MCHC: 32.6 g/dL (ref 31.5–35.7)
MCV: 92 fL (ref 79–97)
Monocytes Absolute: 0.8 10*3/uL (ref 0.1–0.9)
Monocytes: 11 %
Neutrophils Absolute: 4.9 10*3/uL (ref 1.4–7.0)
Neutrophils: 66 %
Platelets: 313 10*3/uL (ref 150–450)
RBC: 4.01 x10E6/uL — ABNORMAL LOW (ref 4.14–5.80)
RDW: 12.4 % (ref 11.6–15.4)
WBC: 7.4 10*3/uL (ref 3.4–10.8)

## 2020-06-02 LAB — BASIC METABOLIC PANEL
BUN/Creatinine Ratio: 24 (ref 10–24)
BUN: 27 mg/dL (ref 10–36)
CO2: 23 mmol/L (ref 20–29)
Calcium: 9.4 mg/dL (ref 8.6–10.2)
Chloride: 96 mmol/L (ref 96–106)
Creatinine, Ser: 1.14 mg/dL (ref 0.76–1.27)
Glucose: 115 mg/dL — ABNORMAL HIGH (ref 65–99)
Potassium: 4.5 mmol/L (ref 3.5–5.2)
Sodium: 134 mmol/L (ref 134–144)
eGFR: 61 mL/min/{1.73_m2} (ref >59)

## 2020-06-02 LAB — SARS CORONAVIRUS 2 (TAT 6-24 HRS): SARS Coronavirus 2: NEGATIVE

## 2020-06-02 NOTE — Telephone Encounter (Signed)
Pt contacted pre-catheterization scheduled at [facility] for: Wednesday June 03, 2020 2:30 PM Verified arrival time and place: [facility] Main Entrance A [facility]) at: 12:30 PM   No solid food after midnight prior to cath, clear liquids until 5 AM day of procedure.  Hold: Lasix-AM of procedure  Except hold medications AM meds can be  taken pre-cath with sips of water including: ASA 81 mg   Confirmed patient has responsible adult to drive home post procedure and be with patient first 24 hours after arriving home: yes  You are allowed ONE visitor in the waiting room during the time you are at the hospital for your procedure. Both you and your visitor must wear a mask once you enter the hospital.   Reviewed procedure/mask/visitor instructions with patient's son (DPR), Richard.

## 2020-06-03 ENCOUNTER — Ambulatory Visit (HOSPITAL_COMMUNITY)
Admission: RE | Admit: 2020-06-03 | Discharge: 2020-06-03 | Disposition: A | Discharge status: Home / Self Care | Payer: Medicare HMO | Attending: Cardiovascular Disease | Admitting: Cardiovascular Disease

## 2020-06-03 ENCOUNTER — Ambulatory Visit (HOSPITAL_COMMUNITY)
Admission: RE | Disposition: A | Discharge status: Home / Self Care | Payer: Self-pay | Source: Home / Self Care | Attending: Cardiovascular Disease

## 2020-06-03 ENCOUNTER — Other Ambulatory Visit: Payer: Self-pay

## 2020-06-03 DIAGNOSIS — Z7982 Long term (current) use of aspirin: Secondary | ICD-10-CM | POA: Insufficient documentation

## 2020-06-03 DIAGNOSIS — Z79899 Other long term (current) drug therapy: Secondary | ICD-10-CM | POA: Diagnosis not present

## 2020-06-03 DIAGNOSIS — I083 Combined rheumatic disorders of mitral, aortic and tricuspid valves: Secondary | ICD-10-CM | POA: Diagnosis present

## 2020-06-03 DIAGNOSIS — I340 Nonrheumatic mitral (valve) insufficiency: Secondary | ICD-10-CM

## 2020-06-03 DIAGNOSIS — I2510 Atherosclerotic heart disease of native coronary artery without angina pectoris: Secondary | ICD-10-CM | POA: Insufficient documentation

## 2020-06-03 HISTORY — PX: RIGHT/LEFT HEART CATH AND CORONARY ANGIOGRAPHY: CATH118266

## 2020-06-03 LAB — POCT I-STAT EG7
Acid-base deficit: 2 mmol/L (ref 0.0–2.0)
Bicarbonate: 22.8 mmol/L (ref 20.0–28.0)
Calcium, Ion: 1.17 mmol/L (ref 1.15–1.40)
HCT: 33 % — ABNORMAL LOW (ref 39.0–52.0)
Hemoglobin: 11.2 g/dL — ABNORMAL LOW (ref 13.0–17.0)
O2 Saturation: 76 %
Potassium: 4.1 mmol/L (ref 3.5–5.1)
Sodium: 136 mmol/L (ref 135–145)
TCO2: 24 mmol/L (ref 22–32)
pCO2, Ven: 39.4 mmHg — ABNORMAL LOW (ref 44.0–60.0)
pH, Ven: 7.371 (ref 7.250–7.430)
pO2, Ven: 42 mmHg (ref 32.0–45.0)

## 2020-06-03 LAB — POCT I-STAT 7, (LYTES, BLD GAS, ICA,H+H)
Acid-base deficit: 3 mmol/L — ABNORMAL HIGH (ref 0.0–2.0)
Bicarbonate: 21.7 mmol/L (ref 20.0–28.0)
Calcium, Ion: 1.11 mmol/L — ABNORMAL LOW (ref 1.15–1.40)
HCT: 33 % — ABNORMAL LOW (ref 39.0–52.0)
Hemoglobin: 11.2 g/dL — ABNORMAL LOW (ref 13.0–17.0)
O2 Saturation: 98 %
Potassium: 4.1 mmol/L (ref 3.5–5.1)
Sodium: 136 mmol/L (ref 135–145)
TCO2: 23 mmol/L (ref 22–32)
pCO2 arterial: 35.7 mmHg (ref 32.0–48.0)
pH, Arterial: 7.393 (ref 7.350–7.450)
pO2, Arterial: 98 mmHg (ref 83.0–108.0)

## 2020-06-03 SURGERY — RIGHT/LEFT HEART CATH AND CORONARY ANGIOGRAPHY
Anesthesia: LOCAL

## 2020-06-03 MED ORDER — ACETAMINOPHEN 325 MG PO TABS: 650.0000 mg | ORAL_TABLET | ORAL | Status: DC | PRN

## 2020-06-03 MED ORDER — SODIUM CHLORIDE 0.9% FLUSH: 3.0000 mL | INTRAVENOUS | Status: DC | PRN

## 2020-06-03 MED ORDER — ONDANSETRON HCL 4 MG/2ML IJ SOLN: 4.0000 mg | Freq: Four times a day (QID) | INTRAMUSCULAR | Status: DC | PRN

## 2020-06-03 MED ORDER — SODIUM CHLORIDE 0.9% FLUSH: 3.0000 mL | Freq: Two times a day (BID) | INTRAVENOUS | Status: DC

## 2020-06-03 MED ORDER — LIDOCAINE HCL (PF) 1 % IJ SOLN
INTRAMUSCULAR | Status: AC
  Filled 2020-06-03: qty 30

## 2020-06-03 MED ORDER — MIDAZOLAM HCL 2 MG/2ML IJ SOLN
INTRAMUSCULAR | Status: AC
  Filled 2020-06-03: qty 2

## 2020-06-03 MED ORDER — SODIUM CHLORIDE 0.9 % IV SOLN: 250.0000 mL | INTRAVENOUS | Status: DC | PRN

## 2020-06-03 MED ORDER — SODIUM CHLORIDE 0.9 % WEIGHT BASED INFUSION
3.0000 mL/kg/h | INTRAVENOUS | Status: AC
  Administered 2020-06-03: 3 mL/kg/h via INTRAVENOUS

## 2020-06-03 MED ORDER — IOHEXOL 350 MG/ML SOLN
INTRAVENOUS | Status: DC | PRN
  Administered 2020-06-03: 90 mL

## 2020-06-03 MED ORDER — FENTANYL CITRATE (PF) 100 MCG/2ML IJ SOLN
INTRAMUSCULAR | Status: DC | PRN
  Administered 2020-06-03: 25 ug via INTRAVENOUS

## 2020-06-03 MED ORDER — HYDRALAZINE HCL 20 MG/ML IJ SOLN: 10.0000 mg | INTRAMUSCULAR | Status: DC | PRN

## 2020-06-03 MED ORDER — MIDAZOLAM HCL 2 MG/2ML IJ SOLN
INTRAMUSCULAR | Status: DC | PRN
  Administered 2020-06-03: 0.5 mg via INTRAVENOUS

## 2020-06-03 MED ORDER — HEPARIN (PORCINE) IN NACL 1000-0.9 UT/500ML-% IV SOLN
INTRAVENOUS | Status: AC
  Filled 2020-06-03: qty 1000

## 2020-06-03 MED ORDER — SODIUM CHLORIDE 0.9 % IV SOLN: INTRAVENOUS | Status: AC

## 2020-06-03 MED ORDER — HEPARIN (PORCINE) IN NACL 1000-0.9 UT/500ML-% IV SOLN
INTRAVENOUS | Status: DC | PRN
  Administered 2020-06-03 (×2): 500 mL

## 2020-06-03 MED ORDER — LIDOCAINE HCL (PF) 1 % IJ SOLN
INTRAMUSCULAR | Status: DC | PRN
  Administered 2020-06-03: 10 mL
  Administered 2020-06-03 (×2): 2 mL

## 2020-06-03 MED ORDER — ASPIRIN 81 MG PO CHEW
81.0000 mg | CHEWABLE_TABLET | ORAL | Status: AC
  Administered 2020-06-03: 81 mg via ORAL
  Filled 2020-06-03: qty 1

## 2020-06-03 MED ORDER — VERAPAMIL HCL 2.5 MG/ML IV SOLN
INTRAVENOUS | Status: AC
  Filled 2020-06-03: qty 2

## 2020-06-03 MED ORDER — FENTANYL CITRATE (PF) 100 MCG/2ML IJ SOLN
INTRAMUSCULAR | Status: AC
  Filled 2020-06-03: qty 2

## 2020-06-03 MED ORDER — LABETALOL HCL 5 MG/ML IV SOLN: 10.0000 mg | INTRAVENOUS | Status: DC | PRN

## 2020-06-03 MED ORDER — SODIUM CHLORIDE 0.9 % WEIGHT BASED INFUSION: 1.0000 mL/kg/h | INTRAVENOUS | Status: DC

## 2020-06-03 SURGICAL SUPPLY — 17 items
CATH BALLN WEDGE 5F 110CM (CATHETERS) ×2 IMPLANT
CATH INFINITI 5FR AL1 (CATHETERS) ×2 IMPLANT
CATH INFINITI 5FR MULTPACK ANG (CATHETERS) ×2 IMPLANT
CATH LAUNCHER 5F EBU4.0 (CATHETERS) ×2 IMPLANT
CLOSURE MYNX CONTROL 5F (Vascular Products) ×2 IMPLANT
GLIDESHEATH SLEND SS 6F .021 (SHEATH) ×2 IMPLANT
GUIDEWIRE .025 260CM (WIRE) ×2 IMPLANT
GUIDEWIRE INQWIRE 1.5J.035X260 (WIRE) ×1 IMPLANT
INQWIRE 1.5J .035X260CM (WIRE) ×2
KIT HEART LEFT (KITS) ×2 IMPLANT
PACK CARDIAC CATHETERIZATION (CUSTOM PROCEDURE TRAY) ×2 IMPLANT
SHEATH GLIDE SLENDER 4/5FR (SHEATH) ×2 IMPLANT
SHEATH PINNACLE 5F 10CM (SHEATH) ×2 IMPLANT
SHEATH PROBE COVER 6X72 (BAG) ×2 IMPLANT
TRANSDUCER W/STOPCOCK (MISCELLANEOUS) ×2 IMPLANT
TUBING CIL FLEX 10 FLL-RA (TUBING) ×2 IMPLANT
WIRE EMERALD 3MM-J .035X150CM (WIRE) ×2 IMPLANT

## 2020-06-03 NOTE — Discharge Instructions (Signed)

## 2020-06-03 NOTE — Interval H&P Note (Signed)
History and Physical Interval Note:  06/03/2020 1:49 PM  Adrian [name]  has presented today for surgery, with the diagnosis of mr.  The various methods of treatment have been discussed with the patient and family. After consideration of risks, benefits and other options for treatment, the patient has consented to  Procedure(s): RIGHT/LEFT HEART CATH AND CORONARY ANGIOGRAPHY (N/A) as a surgical intervention.  The patient's history has been reviewed, patient examined, no change in status, stable for surgery.  I have reviewed the patient's chart and labs.  Questions were answered to the patient's satisfaction.    Cath Lab Visit (complete for each Cath Lab visit)  Clinical Evaluation Leading to the Procedure:   ACS: No.  Non-ACS:    Anginal Classification: No Symptoms  Anti-ischemic medical therapy: No Therapy  Non-Invasive Test Results: No non-invasive testing performed  Prior CABG: No previous CABG   Verne Carrow

## 2020-06-04 ENCOUNTER — Encounter (HOSPITAL_COMMUNITY): Payer: Self-pay | Admitting: Cardiovascular Disease

## 2020-06-04 MED FILL — Verapamil HCl IV Soln 2.5 MG/ML: INTRAVENOUS | Qty: 2 | Status: AC

## 2020-06-08 ENCOUNTER — Other Ambulatory Visit: Payer: Self-pay

## 2020-06-08 ENCOUNTER — Ambulatory Visit (INDEPENDENT_AMBULATORY_CARE_PROVIDER_SITE_OTHER): Payer: Medicare HMO | Admitting: Dentistry

## 2020-06-08 VITALS — BP 118/75 | HR 81 | Temp 98.0°F

## 2020-06-08 DIAGNOSIS — K08109 Complete loss of teeth, unspecified cause, unspecified class: Secondary | ICD-10-CM

## 2020-06-08 DIAGNOSIS — I340 Nonrheumatic mitral (valve) insufficiency: Secondary | ICD-10-CM

## 2020-06-08 DIAGNOSIS — I5033 Acute on chronic diastolic (congestive) heart failure: Secondary | ICD-10-CM | POA: Diagnosis not present

## 2020-06-08 DIAGNOSIS — K0850 Unsatisfactory restoration of tooth, unspecified: Secondary | ICD-10-CM

## 2020-06-08 DIAGNOSIS — Z01818 Encounter for other preprocedural examination: Secondary | ICD-10-CM

## 2020-06-08 DIAGNOSIS — K083 Retained dental root: Secondary | ICD-10-CM

## 2020-06-08 DIAGNOSIS — K036 Deposits [accretions] on teeth: Secondary | ICD-10-CM

## 2020-06-08 DIAGNOSIS — K06020 Generalized gingival recession, unspecified: Secondary | ICD-10-CM

## 2020-06-08 DIAGNOSIS — M264 Malocclusion, unspecified: Secondary | ICD-10-CM

## 2020-06-08 NOTE — Progress Notes (Signed)
Department of Dental Medicine     OUTPATIENT CONSULTATION  Service Date:   06/08/2020  Patient Name:  Adrian [name] Date of Birth:   1927-12-10 Medical Record Number: [id number]  Referring Provider:              Tonny BollmanMichael Cooper, MD   PLAN & RECOMMENDATIONS  RECOMMENDATIONS > There are no current signs of acute dental infection including abscess, edema or erythema, or suspicious lesion requiring biopsy.  The patient does have multiple retained roots that have protective restorations on them, but are still exposed to oral flora and need to monitored by a dentist every 6 mos for any signs of caries or infection. >> Recommend the patient find an outside dental provider of his choice to see for cleanings and periodic exams to decrease the risk of perioperative and postoperative systemic infection and/or other complications.  There are no recommendations/indications for dental intervention prior to his heart surgery at this time. >>> Plan to discuss with medical team and coordinate treatment as needed.   >>  Discussed in detail all treatment options with the patient and they are agreeable to the plan.   Thank you for consulting with [facility] and for the opportunity to participate in this patient's treatment.  Should you have any questions or concerns, please contact the [facility] at [phone].   06/08/2020      CONSULT NOTE   COVID 19 SCREENING: The patient denies symptoms concerning for COVID-19 infection including fever, chills, cough, or newly developed shortness of breath.   HISTORY OF PRESENT ILLNESS: >> Adrian [name] is a very pleasant 87 y.o. male with h/o GERD, OSA, intracranial hemorrhage, acute respiratory failure, arrhythmia and congestive heart failure who was recently diagnosed with severe mitral regurgitation and is anticipating MVR with MitraClip.  The patient presents today with his daughter for a medically necessary dental consultation as part  of their pre-cardiac surgical work-up.  DENTAL HISTORY: > The patient reports it has been a few years since he has been to the dentist (not since COVID).  He says that he believes he takes good care of his teeth.  He has an upper and lower partial denture that he is happy with both esthetically and   They currently deny any dental/orofacial pain or sensitivity. >> Patient is able to manage oral secretions.  Patient denies dysphagia, odynophagia, dysphonia, SOB and neck pain.  Patient denies fever, rigors and malaise.   CHIEF COMPLAINT:  Here for a preoperative dental evaluation.   Patient Active Problem List   Diagnosis Date Noted  . Acute respiratory failure (HCC) 05/24/2020  . Chronic diastolic heart failure (HCC) 05/24/2020  . Persistent atrial fibrillation (HCC) 05/24/2020  . ICH (intracerebral hemorrhage) (HCC) 05/24/2020  . Severe mitral regurgitation   . CHF (congestive heart failure) (HCC) 05/21/2020   Past Medical History:  Diagnosis Date  . Arrhythmia   . GERD (gastroesophageal reflux disease)   . Intracranial hemorrhage (HCC)   . OSA (obstructive sleep apnea)    Past Surgical History:  Procedure Laterality Date  . CRANIOTOMY    . RIGHT/LEFT HEART CATH AND CORONARY ANGIOGRAPHY N/A 06/03/2020   Procedure: RIGHT/LEFT HEART CATH AND CORONARY ANGIOGRAPHY;  Surgeon: Kathleene HazelMcAlhany, Christopher D, MD;  [facility]: [facility];  Service: Cardiovascular;  Laterality: N/A;  . SHOULDER SURGERY    . TEE WITHOUT CARDIOVERSION N/A 05/22/2020   Procedure: TRANSESOPHAGEAL ECHOCARDIOGRAM (TEE);  Surgeon: Jodelle Redhristopher, Bridgette, MD;  [facility];  Service: Cardiovascular;  Laterality:  N/A;   Allergies  Allergen Reactions  . Milk Protein Other (See Comments)    Causes runny nose and sneezing    Current Outpatient Medications  Medication Sig Dispense Refill  . acetaminophen (TYLENOL) 500 MG tablet Take 500 mg by mouth in the morning, at noon, and at bedtime. (0900, 1300 &  2100)    . aspirin 81 MG EC tablet Take 81 mg by mouth daily. (0900)    . Calcium Carb-Cholecalciferol (CALCIUM 500 + D3) 500-200 MG-UNIT TABS Take 1 tablet by mouth daily. (0900)    . diclofenac Sodium (VOLTAREN) 1 % GEL Apply 2 g topically in the morning and at bedtime. (0900 & 2100)    . esomeprazole (NEXIUM) 20 MG capsule Take 20 mg by mouth daily. (0700)    . fluticasone (FLONASE) 50 MCG/ACT nasal spray Place 1 spray into both nostrils in the morning and at bedtime. (0900 & 1700)    . furosemide (LASIX) 40 MG tablet Take 1 tablet (40 mg total) by mouth daily. (Patient taking differently: Take 40 mg by mouth daily. (0900)) 90 tablet 3  . guaiFENesin (MUCINEX) 600 MG 12 hr tablet Take 600 mg by mouth 2 (two) times daily. (0900 & 1700)    . montelukast (SINGULAIR) 10 MG tablet Take 10 mg by mouth at bedtime.    . Multiple Vitamin (MULTIVITAMIN WITH MINERALS) TABS tablet Take 1 tablet by mouth daily. (0900)    . Multiple Vitamins-Minerals (PRESERVISION AREDS 2 PO) Take 1 tablet by mouth in the morning and at bedtime. (0900 & 1700)    . nitroGLYCERIN (NITROSTAT) 0.4 MG SL tablet Place 1 tablet (0.4 mg total) under the tongue every 5 (five) minutes x 3 doses as needed for chest pain. 25 tablet 2  . rivastigmine (EXELON) 3 MG capsule Take 3 mg by mouth 2 (two) times daily. (0900 & 1700)    . Saw Palmetto 500 MG CAPS Take 500 mg by mouth daily at 12 noon. (1200)    . sertraline (ZOLOFT) 25 MG tablet Take 25 mg by mouth at bedtime. (2100)    . Skin Protectants, Misc. (BAZA PROTECT EX) Apply 1 application topically in the morning and at bedtime. (0900 & 2100)     No current facility-administered medications for this visit.    LABS: Lab Results  Component Value Date   WBC 7.4 06/01/2020   HGB 11.2 (L) 06/03/2020   HCT 33.0 (L) 06/03/2020   MCV 92 06/01/2020   PLT 313 06/01/2020      Component Value Date/Time   NA 136 06/03/2020 1558   NA 134 06/01/2020 0945   K 4.1 06/03/2020 1558   CL 96  06/01/2020 0945   CO2 23 06/01/2020 0945   GLUCOSE 115 (H) 06/01/2020 0945   GLUCOSE 98 05/24/2020 0120   BUN 27 06/01/2020 0945   CREATININE 1.14 06/01/2020 0945   CALCIUM 9.4 06/01/2020 0945   GFRNONAA 57 (L) 05/24/2020 0120   No results found for: INR, PROTIME No results found for: PTT  Social History   Socioeconomic History  . Marital status: Widowed    Spouse name: Not on file  . Number of children: 7  . Years of education: Not on file  . Highest education level: Not on file  Occupational History  . Occupation: retired Immunologist  Tobacco Use  . Smoking status: Never Smoker  . Smokeless tobacco: Never Used  Substance and Sexual Activity  . Alcohol use: Never  . Drug use: Never  .  Sexual activity: Not on file  Other Topics Concern  . Not on file  Social History Narrative  . Not on file   Social Determinants of Health   Financial Resource Strain: Not on file  Food Insecurity: Not on file  Transportation Needs: Not on file  Physical Activity: Not on file  Stress: Not on file  Social Connections: Not on file  Intimate Partner Violence: Not on file   Family History  Problem Relation Age of Onset  . Cancer Father      REVIEW OF SYSTEMS: Reviewed with the patient as per HPI. PSYCH: Patient denies having dental phobia.  VITAL SIGNS: BP 118/75 (BP Location: Right Arm)   Pulse 81   Temp 98 F (36.7 C) (Oral)    PHYSICAL EXAM: >> General:  Well-developed, comfortable and in no apparent distress. >> Neurological:  Alert and oriented to person, place and  time. >> Extraoral:  Facial symmetry present without any edema or erythema.  No swelling or lymphadenopathy.  TMJ asymptomatic without clicks or crepitations. >> Maximum Interincisal Opening: 50 mm >> Intraoral:  Soft tissues appear well-perfused and mucous membranes moist.  FOM and vestibules soft and not raised. Oral cavity without mass or lesion. No signs of infection, parulis, sinus tract, edema or  erythema evident upon exam.   (+) Unilateral mandibular tori (right side), left side buccal mucosa with cheek biting trauma.   DENTAL EXAM: Hard tissue exam completed and charted.  >> Dentition:  Overall fair remaining dentition.  Missing teeth, caries, retained root tips, existing restorations and fixed prosthodontics.   >> The patient is maintaining good oral hygiene.  >> Periodontal: Pink, healthy gingival tissue with blunted papilla. #14 buccal recession, #22, #23, #25 facial and lingual gingival recession. >> Caries: #58F(V). #10, #13 and #24 arrested caries/tooth discoloration- recommend monitoring. >> Retained Root Tips: #8, #9, #10, #13 and #24. #8, #9 and #10 have had large composite restorations covering all surfaces of tooth at gingival margin, #10 has lost some/all of the existing protective restoration. #13 and #24 have some discoloration/arrested caries that is not active.  >> Defective Restorations: #23 has distal-incisal-lingual fracture of large existing composite restoration. >> Endodontics:  #7, #9, #10 and #19 previous RCT. #7 and #19 have definitive crowns, #8 and #9 have core build-ups. >> Removable/Fixed Prosthodontics: The patient has an upper and lower cast metal partial denture replacing all missing teeth and all retained root tips with good retention and function. >> Occlusion: (+) Crossbite- mandible occludes to the left of maxillary occlusal plane. Non-functional and supra-erupted teeth #s 2, 3, 12 and 19. #28 non-functional.    RADIOGRAPHIC EXAM: PAN (x2- patient left partial dentures in and retake was necessary for diagnostic purposes) and Full Mouth Series exposed and interpreted.  >> Condyles seated bilaterally in fossas.  No evidence of abnormal pathology.  All visualized osseous structures appear WNL. #2, #3, #11, #12, #18 and #19 mesial drift. Bilateral radiopaque regions near spine consistent with prior surgery.  >> Localized mild horizontal bone loss  consistent with mild periodontitis vs gingival recession on a healthy but reduced periodontium.   >> Missing teeth: #1, #4, #5, #14, #16, #17, #20, #21, #29, #30, #31 and #32. Existing restorations: #8, #9, #15, #22 and #23 have large fillings replacing roughly >50% coronal tooth structure. #7, #9, #10 and #19 previous endodontic therapy: #9 inadequate gutta percha fill stops in middle third of canal w/ no definitive crown; #10 has periapical radiolucency consistent with either normal healing  from past RCT or new infection, no definitive crown; #7 adequate gutta percha fill with some sealer extruded past canal, definitive crown coverage w/ posts/pin placement.    ASSESSMENT:  1. Severe mitral regurgitation 2. Acute on chronic diastolic heart failure 3. Preoperative dental consultation 3. Missing teeth 4. Retained root tips 5. Gingival recession 6. Defective dental restoration 7. Healthy gingiva on a reduced periodontium 8. Malocclusion 9. Postoperative bleeding risk   PLAN AND RECOMMENDATIONS: >  I discussed the risks, benefits, and complications of various scenarios with the patient in relationship to their medical and dental conditions, which included systemic infection such as endocarditis, bacteremia or other serious issues that could potentially occur either before, during or after their anticipated surgery if dental/oral concerns are not addressed.  I explained that if any chronic or acute dental/oral infection(s) are addressed and subsequently not maintained following medical optimization and recovery, their risk of the previously mentioned complications are just as high and could potentially occur postoperatively.  I explained all significant findings of the dental consultation with the patient including his retained root tips that have been covered with composite/protective restorative material that may become problematic in the future if he becomes symptomatic or they become carious  since they are still exposed to the oral flora and not completely buried underneath his gums and the recommended care including findings a local outside dentist for comprehensive care and close monitoring of his oral health in order to optimize them for heart surgery from a dental standpoint.  The patient verbalized understanding of all findings, discussion, and recommendations. >>  We then discussed various treatment options to include no treatment, multiple extractions with alveoloplasty, pre-prosthetic surgery as indicated, periodontal therapy, dental restorations, root canal therapy, crown and bridge therapy, implant therapy, and replacement of missing teeth as indicated.  The patient verbalized understanding of all options, and currently wishes to proceed with finding a regular dentist outside for comprehensive dental care including cleanings, replacement of missing teeth and periodic exams. >>>  Plan to discuss all findings and recommendations with medical team and coordinate future care as needed.  <> The patient tolerated today's visit well.  All questions and concerns were addressed and answered, and the patient departed in stable condition.   I spent in excess of 120 minutes during the conduct of this consultation and >50% of this time involved direct face-to-face encounter for counseling and/or coordination of the patient's care. Jerris Keltz B. Chales Salmon, D.M.D.

## 2020-06-08 NOTE — Patient Instructions (Signed)
[place] Department of Dental Medicine Dr. Rolla Servidio B. Fey Coghill, D.M.D. Phone: [phone] Fax: [phone]   It was a pleasure seeing you today!  Please refer to the information below regarding your dental visit, and call us should you have any questions or concerns that may come up after you leave.   Thank you for giving us the opportunity to provide care for you.  If there is anything we can do for you, please let us know.    HEART VALVES AND MOUTH CARE   FACTS:  If you have any infection in your mouth, it can infect your heart valve.  If you heart valve is infected, you will be seriously ill.  Infections in the mouth can be SILENT and do not always cause pain.  Examples of infections in the mouth are gum disease, dental cavities, and abscesses.  Some possible signs of infection are: Bad breath, bleeding gums, or teeth that are sensitive to sweets, hot, and/or cold. There are many other signs as well.   WHAT YOU HAVE TO DO:  Brush your teeth after meals and at bedtime. Spend at least 2 minutes brushing well, especially behind your back teeth and all around your teeth that stand alone. Brush at the gumline also.  Do not go to bed without brushing your teeth and flossing.  If your gums bleed when you brush or floss, do NOT stop brushing or flossing.  Bleeding can be a sign of inflammation or irritation from bacteria.  It usually means that your gums need more attention and better cleaning.   If your Dentist or Dr. Ayyan Sites gave you a prescription mouthwash to use, make sure to use it as directed. If you run out of the medication, get a refill at the pharmacy.  If you were given any other medications or directions by your Dentist, please follow them. If you did not understand the directions or forget what you were told, please call. We will be happy to refresh your memory.  If you need antibiotics before dental procedures, make sure you take them one hour prior to every  dental visit as directed.   Get a dental check-up every 4-6 months in order to keep your mouth healthy, or to find and treat any new infection. You will most likely need your teeth cleaned or gums treated at the same time.  If you are not able to come in for your scheduled appointment, call your Dentist as soon as possible to reschedule.  If you have a problem in between dental visits, call your Dentist.    QUESTIONS? . Call our office during office hours [phone].   WE ARE A TEAM.  OUR GOAL IS:  HEALTHY MOUTH, HEALTHY HEART 

## 2020-06-09 ENCOUNTER — Ambulatory Visit: Payer: Medicare HMO | Admitting: Cardiovascular Disease

## 2020-06-09 ENCOUNTER — Ambulatory Visit: Payer: Medicare HMO | Attending: Cardiovascular Disease | Admitting: Physical Therapy

## 2020-06-09 ENCOUNTER — Other Ambulatory Visit: Payer: Self-pay

## 2020-06-09 ENCOUNTER — Encounter: Payer: Self-pay | Admitting: Physical Therapy

## 2020-06-09 DIAGNOSIS — M6281 Muscle weakness (generalized): Secondary | ICD-10-CM

## 2020-06-09 DIAGNOSIS — I340 Nonrheumatic mitral (valve) insufficiency: Secondary | ICD-10-CM

## 2020-06-09 NOTE — Therapy (Signed)
[facility] [address] [place], [place], [address] Phone: [phone]   Fax:  [phone]  Physical Therapy Evaluation  Patient Details  Name: Adrian [name] MRN: [id number] Date of Birth: [birth year] Referring Provider (PT): Tonny Bollman MD   Encounter Date: 06/09/2020   PT End of Session - 06/09/20 1500    Visit Number 1    Number of Visits 1    Date for PT Re-Evaluation 06/10/20    PT Start Time 1500    PT Stop Time 1530    PT Time Calculation (min) 30 min    Activity Tolerance Patient tolerated treatment well    Behavior During Therapy [facility] for tasks assessed/performed           Past Medical History:  Diagnosis Date  . Arrhythmia   . GERD (gastroesophageal reflux disease)   . Intracranial hemorrhage (HCC)   . OSA (obstructive sleep apnea)     Past Surgical History:  Procedure Laterality Date  . CRANIOTOMY    . RIGHT/LEFT HEART CATH AND CORONARY ANGIOGRAPHY N/A 06/03/2020   Procedure: RIGHT/LEFT HEART CATH AND CORONARY ANGIOGRAPHY;  Surgeon: Kathleene Hazel, MD;  Location: [facility] INVASIVE CV LAB;  Service: Cardiovascular;  Laterality: N/A;  . SHOULDER SURGERY    . TEE WITHOUT CARDIOVERSION N/A 05/22/2020   Procedure: TRANSESOPHAGEAL ECHOCARDIOGRAM (TEE);  Surgeon: Jodelle Red, MD;  Location: [facility] ENDOSCOPY;  Service: Cardiovascular;  Laterality: N/A;    There were no vitals filed for this visit.    Subjective Assessment - 06/09/20 1505    Subjective pt is  a 87 y.o M with CC of feeling out of breath that has been going on for a little while. He reports having a hx of a chronic HA but otherwise reports no other issues.    Patient Stated Goals to fix heart    Currently in Pain? No/denies              [facility] PT Assessment - 06/09/20 0001      Assessment   Medical Diagnosis Severe mitral insuffiency    Referring Provider (PT) Tonny Bollman MD    Hand Dominance Left      Precautions    Precautions None      Restrictions   Weight Bearing Restrictions No      Balance Screen   Has the patient fallen in the past 6 months No      Home Environment   Living Environment Assisted living    Home Equipment Shower seat - built in;Grab bars - tub/shower;Grab bars - toilet   rollator     Prior Function   Level of Independence Independent with basic ADLs      ROM / Strength   AROM / PROM / Strength AROM;Strength      AROM   Overall AROM Comments gross limited shoulder AROM in all planes      Strength   Overall Strength Comments gross LE/UE strength 4//5    Strength Assessment Site Hand    Right Hand Grip (lbs) 20    Left Hand Grip (lbs) 35      Ambulation/Gait   Ambulation/Gait Yes    Assistive device Rollator    Gait Pattern Step-through pattern;Decreased stride length            [facility] Pre-Surgical Assessment - 06/09/20 0001    5 Meter Walk Test- trial 1 5 sec    5 Meter Walk Test- trial 2 6 sec.     5  Meter Walk Test- trial 3 6 sec.    5 meter walk test average 5.67 sec    4 Stage Balance Test tolerated for:  10 sec.    4 Stage Balance Test Position 2    Comment --   unable to perform   ADL/IADL Independent with: Bathing    ADL/IADL Needs Assistance with: Dressing;Finances;Meal prep;Yard work    ADL/IADL Freight forwarder Index Moderately frail    6 Minute Walk- Baseline yes    BP (mmHg) 125/86    HR (bpm) 59    02 Sat (%RA) 98 %    Modified Borg Scale for Dyspnea 3- Moderate shortness of breath or breathing difficulty    Perceived Rate of Exertion (Borg) 11- Fairly light    6 Minute Walk Post Test yes    BP (mmHg) 125/81    HR (bpm) 88    02 Sat (%RA) 95 %    Modified Borg Scale for Dyspnea 3- Moderate shortness of breath or breathing difficulty    Perceived Rate of Exertion (Borg) 13- Somewhat hard    Aerobic Endurance Distance Walked 616    Endurance additional comments pt demonstrated 54.97% limitation compared to age related norm   age related norm for  85-89 is 1368 ft                   Objective measurements completed on examination: See above findings.                            Plan - 06/09/20 1500    Clinical Impression Statement see assessment in note    PT Next Visit Plan PRe-Mitra clip evaluation    Consulted and Agree with Plan of Care Patient            Clinical Impression Statement: Pt is a [name] presenting to OP PT for evaluation prior to possible Mitraclip surgery due to severe mitral regurgitation. Pt reports onset of general fatigue and is unable to note how long it has been bothering him. Symptoms are limiting prolonged standing/ walking. Pt presents with limited UE ROM and strength , limited balance and is assessed as high at high fall risk 4 stage balance test, good walking speed with rollator and limited aerobic endurance per 6 minute walk test. Pt ambulated 616 feet in with rollator without  requesting a seated rest beak lasting . At end of test, patient's HR was 88 bpm and O2 was 95% on room air. Pt reported 3/10 shortness of breath on modified scale for dyspnea.  Pt ambulated a total of 616 feet in 6 minute walk. General fatigue increased significantly with 6 minute walk test. Based on the Short Physical Performance Battery, patient has a frailty rating of 4/12 with </= 5/12 considered frail.    Patient demonstrated the following deficits and impairments:     Visit Diagnosis: Muscle weakness (generalized)     Problem List Patient Active Problem List   Diagnosis Date Noted  . Acute respiratory failure (HCC) 05/24/2020  . Chronic diastolic heart failure (HCC) 05/24/2020  . Persistent atrial fibrillation (HCC) 05/24/2020  . ICH (intracerebral hemorrhage) (HCC) 05/24/2020  . Severe mitral regurgitation   . CHF (congestive heart failure) (HCC) 05/21/2020   Lulu Riding PT, DPT, LAT, ATC  06/09/20  3:39 PM      [facility] [address] [place], [place], [address] Phone: [phone]   Fax:  [phone]  Name: Adrian [name] MRN: [id number] Date of Birth: [birth year]

## 2020-06-12 ENCOUNTER — Ambulatory Visit: Payer: Medicare HMO | Admitting: Cardiovascular Disease

## 2020-06-15 ENCOUNTER — Encounter: Payer: Self-pay | Admitting: Thoracic Surgery (Cardiothoracic Vascular Surgery)

## 2020-06-15 ENCOUNTER — Institutional Professional Consult (permissible substitution): Payer: Medicare HMO | Admitting: Thoracic Surgery (Cardiothoracic Vascular Surgery)

## 2020-06-15 ENCOUNTER — Other Ambulatory Visit: Payer: Self-pay

## 2020-06-15 VITALS — BP 135/76 | HR 78 | Resp 20 | Ht 67.0 in

## 2020-06-15 DIAGNOSIS — I340 Nonrheumatic mitral (valve) insufficiency: Secondary | ICD-10-CM

## 2020-06-15 NOTE — Progress Notes (Signed)
Surgical Instructions    Your procedure is scheduled on 06/18/20.  Report to [facility] Main Entrance "A" at 05:30 A.M., then check in with the Admitting office.  Call this number if you have problems the morning of surgery:  [phone]   If you have any questions prior to your surgery date call [phone]: Open Monday-Friday 8am-4pm    Remember:  Do not eat or drink after midnight the night before your surgery    Take these medicines the morning of surgery with A SIP OF WATER   esomeprazole (NEXIUM) rivastigmine (EXELON).  As of today, STOP taking any (unless otherwise instructed by your surgeon) Aleve, Naproxen, Ibuprofen, Motrin, Advil, Goody's, BC's, all herbal medications, fish oil, and all vitamins.                     Do not wear jewelry, make up, or nail polish            Do not wear lotions, powders, perfumes/colognes, or deodorant.            Men may shave face and neck.            Do not bring valuables to the hospital.            [facility] is not responsible for any belongings or valuables.  Do NOT Smoke (Tobacco/Vaping) or drink Alcohol 24 hours prior to your procedure If you use a CPAP at night, you may bring all equipment for your overnight stay.   Contacts, glasses, dentures or bridgework may not be worn into surgery, please bring cases for these belongings   For patients admitted to the hospital, discharge time will be determined by your treatment team.   Patients discharged the day of surgery will not be allowed to drive home, and someone needs to stay with them for 24 hours.    Special instructions:   [place]- Preparing For Surgery  Before surgery, you can play an important role. Because skin is not sterile, your skin needs to be as free of germs as possible. You can reduce the number of germs on your skin by washing with CHG (chlorahexidine gluconate) Soap before surgery.  CHG is an antiseptic cleaner which kills germs and bonds with the skin to  continue killing germs even after washing.    Oral Hygiene is also important to reduce your risk of infection.  Remember - BRUSH YOUR TEETH THE MORNING OF SURGERY WITH YOUR REGULAR TOOTHPASTE  Please do not use if you have an allergy to CHG or antibacterial soaps. If your skin becomes reddened/irritated stop using the CHG.  Do not shave (including legs and underarms) for at least 48 hours prior to first CHG shower. It is OK to shave your face.  Please follow these instructions carefully.   1. Shower the NIGHT BEFORE SURGERY and the MORNING OF SURGERY  2. If you chose to wash your hair, wash your hair first as usual with your normal shampoo.  3. After you shampoo, rinse your hair and body thoroughly to remove the shampoo.  4. Wash Face and genitals (private parts) with your normal soap.   5.  Shower the NIGHT BEFORE SURGERY and the MORNING OF SURGERY with CHG Soap.   6. Use CHG Soap as you would any other liquid soap. You can apply CHG directly to the skin and wash gently with a scrungie or a clean washcloth.   7. Apply the CHG Soap to your body ONLY  FROM THE NECK DOWN.  Do not use on open wounds or open sores. Avoid contact with your eyes, ears, mouth and genitals (private parts). Wash Face and genitals (private parts)  with your normal soap.   8. Wash thoroughly, paying special attention to the area where your surgery will be performed.  9. Thoroughly rinse your body with warm water from the neck down.  10. DO NOT shower/wash with your normal soap after using and rinsing off the CHG Soap.  11. Pat yourself dry with a CLEAN TOWEL.  12. Wear CLEAN PAJAMAS to bed the night before surgery  13. Place CLEAN SHEETS on your bed the night before your surgery  14. DO NOT SLEEP WITH PETS.   Day of Surgery: Take a shower with CHG soap.  Wear Clean/Comfortable clothing the morning of surgery Do not apply any deodorants/lotions.   Remember to brush your teeth WITH YOUR REGULAR  TOOTHPASTE.   Please read over the following fact sheets that you were given.

## 2020-06-15 NOTE — Progress Notes (Signed)
[facility]  MULTIDISCIPLINARY HEART VALVE CLINIC  CARDIOTHORACIC SURGERY CONSULTATION REPORT  Primary Cardiologist is Reatha Harps, MD PCP is [facility]  Chief Complaint  Patient presents with  . Mitral Regurgitation    Initial surgical consult, ECHO 3/10, TEE 3/29, Cath 3/29    HPI:  Patient is a 87 year old male with known history of heart murmur, mitral valve prolapse, and recurrent paroxysmal atrial fibrillation not currently on long-term anticoagulation who was recently hospitalized with acute diastolic congestive heart failure, diagnosed with severe mitral regurgitation, and has been referred for surgical consultation to discuss treatment options.  Patient states that he has known history of mitral valve prolapse and heart murmur dating back many years.  He developed paroxysmal atrial fibrillation and was anticoagulated using warfarin until he suffered intracranial hemorrhage requiring craniotomy for decompression.  He has been managed off oral anticoagulation ever since.  He describes a 2-year history of symptoms of exertional shortness of breath which progressed rapidly within the last 6 to 8 weeks, ultimately causing him to present to the emergency department in early March where he was admitted with acute diastolic congestive heart failure and pulmonary edema.  Transthoracic and transesophageal echocardiograms revealed normal left ventricular function with mitral valve prolapse and severe mitral regurgitation.  The patient improved and has been doing better on oral diuretic therapy.  The patient was referred to the multidisciplinary heart valve clinic and has been evaluated previously by Dr. Excell Seltzer.  He underwent diagnostic cardiac catheterization by Dr. Clifton James which revealed mild nonobstructive coronary artery disease.  Cardiothoracic surgical consultation was requested.  Patient is recently become a widower and lives in assisted living at [facility] in [place].  He is accompanied by his daughter-in-law.  One of his sons lives here in [place] and [place].  Patient has limited mobility and uses a rolling walker for ambulation.  He has had several mechanical falls because of generalized weakness and unsteady gait.  He describes stable symptoms of exertional shortness of breath that occur with low-level activity.  At present he denies resting shortness of breath, PND, orthopnea, or lower extremity edema.  Past Medical History:  Diagnosis Date  . Arrhythmia   . GERD (gastroesophageal reflux disease)   . Intracranial hemorrhage (HCC)   . OSA (obstructive sleep apnea)     Past Surgical History:  Procedure Laterality Date  . CRANIOTOMY    . RIGHT/LEFT HEART CATH AND CORONARY ANGIOGRAPHY N/A 06/03/2020   Procedure: RIGHT/LEFT HEART CATH AND CORONARY ANGIOGRAPHY;  Surgeon: Kathleene Hazel, MD;  Location: [facility] INVASIVE CV LAB;  Service: Cardiovascular;  Laterality: N/A;  . SHOULDER SURGERY    . TEE WITHOUT CARDIOVERSION N/A 05/22/2020   Procedure: TRANSESOPHAGEAL ECHOCARDIOGRAM (TEE);  Surgeon: Jodelle Red, MD;  Location: [facility] ENDOSCOPY;  Service: Cardiovascular;  Laterality: N/A;    Family History  Problem Relation Age of Onset  . Cancer Father     Social History   Socioeconomic History  . Marital status: Widowed    Spouse name: Not on file  . Number of children: 7  . Years of education: Not on file  . Highest education level: Not on file  Occupational History  . Occupation: retired Immunologist  Tobacco Use  . Smoking status: Never Smoker  . Smokeless tobacco: Never Used  Substance and Sexual Activity  . Alcohol use: Never  . Drug use: Never  . Sexual activity: Not on file  Other Topics Concern  . Not  on file  Social History Narrative  . Not on file   Social Determinants of Health   Financial Resource Strain: Not on file  Food Insecurity: Not on file  Transportation Needs:  Not on file  Physical Activity: Not on file  Stress: Not on file  Social Connections: Not on file  Intimate Partner Violence: Not on file    Current Outpatient Medications  Medication Sig Dispense Refill  . acetaminophen (TYLENOL) 500 MG tablet Take 500 mg by mouth in the morning, at noon, and at bedtime. (0900, 1300 & 2100)    . aspirin 81 MG EC tablet Take 81 mg by mouth daily. (0900)    . Calcium Carb-Cholecalciferol (CALCIUM 500 + D3) 500-200 MG-UNIT TABS Take 1 tablet by mouth daily. (0900)    . diclofenac Sodium (VOLTAREN) 1 % GEL Apply 2 g topically in the morning and at bedtime. (0900 & 2100)    . esomeprazole (NEXIUM) 20 MG capsule Take 20 mg by mouth daily. (0700)    . fluticasone (FLONASE) 50 MCG/ACT nasal spray Place 1 spray into both nostrils in the morning and at bedtime. (0900 & 1700)    . furosemide (LASIX) 40 MG tablet Take 1 tablet (40 mg total) by mouth daily. (Patient taking differently: Take 40 mg by mouth daily. (0900)) 90 tablet 3  . guaiFENesin (MUCINEX) 600 MG 12 hr tablet Take 600 mg by mouth 2 (two) times daily. (0900 & 1700)    . montelukast (SINGULAIR) 10 MG tablet Take 10 mg by mouth at bedtime.    . Multiple Vitamin (MULTIVITAMIN WITH MINERALS) TABS tablet Take 1 tablet by mouth daily. (0900)    . Multiple Vitamins-Minerals (PRESERVISION AREDS 2 PO) Take 1 tablet by mouth in the morning and at bedtime. (0900 & 1700)    . nitroGLYCERIN (NITROSTAT) 0.4 MG SL tablet Place 1 tablet (0.4 mg total) under the tongue every 5 (five) minutes x 3 doses as needed for chest pain. 25 tablet 2  . rivastigmine (EXELON) 3 MG capsule Take 3 mg by mouth 2 (two) times daily. (0900 & 1700)    . Saw Palmetto 500 MG CAPS Take 500 mg by mouth daily at 12 noon. (1200)    . sertraline (ZOLOFT) 25 MG tablet Take 25 mg by mouth at bedtime. (2100)    . Skin Protectants, Misc. (BAZA PROTECT EX) Apply 1 application topically in the morning and at bedtime. (0900 & 2100)     No current  facility-administered medications for this visit.    Allergies  Allergen Reactions  . Milk Protein Other (See Comments)    Causes runny nose and sneezing       Review of Systems:   General:  normal appetite, decreased energy, no weight gain, no weight loss, no fever  Cardiac:  no chest pain with exertion, no chest pain at rest, +SOB with exertion, no resting SOB, no PND, no orthopnea, no palpitations, + arrhythmia, + atrial fibrillation, no LE edema, no dizzy spells, no syncope  Respiratory:  + shortness of breath, no home oxygen, no productive cough, no dry cough, no bronchitis, no wheezing, no hemoptysis, no asthma, no pain with inspiration or cough, no sleep apnea, + CPAP at night  GI:   no difficulty swallowing, no reflux, no frequent heartburn, no hiatal hernia, no abdominal pain, no constipation, no diarrhea, no hematochezia, no hematemesis, no melena  GU:   no dysuria,  no frequency, no urinary tract infection, no hematuria, no enlarged prostate, no  kidney stones, no kidney disease  Vascular:  no pain suggestive of claudication, no pain in feet, no leg cramps, no varicose veins, no DVT, no non-healing foot ulcer  Neuro:   no stroke, no TIA's, no seizures, + headaches, no temporary blindness one eye,  no slurred speech, no peripheral neuropathy, no chronic pain, no instability of gait, no memory/cognitive dysfunction  Musculoskeletal: + arthritis, no joint swelling, no myalgias, significant difficulty walking, limited mobility   Skin:   no rash, no itching, no skin infections, no pressure sores or ulcerations  Psych:   no anxiety, no depression, no nervousness, no unusual recent stress  Eyes:   no blurry vision, no floaters, no recent vision changes, no wears glasses or contacts  ENT:   no hearing loss, no loose or painful teeth, no dentures, last saw dentist last week  Hematologic:  no easy bruising, no abnormal bleeding, no clotting disorder, no frequent epistaxis  Endocrine:  no  diabetes, does not check CBG's at home           Physical Exam:   Pulse 78   Ht 5\' 7"  (1.702 m)   BMI 23.49 kg/m   General:  Elderly and frail-appearing  HEENT:  Unremarkable   Neck:   no JVD, no bruits, no adenopathy   Chest:   clear to auscultation, symmetrical breath sounds, no wheezes, no rhonchi   CV:   RRR, grade III/VI holosystolic murmur heard best at apex,  no diastolic murmur  Abdomen:  soft, non-tender, no masses   Extremities:  warm, well-perfused, pulses not palpable, trace LE edema  Rectal/GU  Deferred  Neuro:   Grossly non-focal and symmetrical throughout  Skin:   Clean and dry, no rashes, no breakdown   Diagnostic Tests:  ECHOCARDIOGRAM REPORT       Patient Name:  Adrian BilletHAROLD Cullens Date of Exam: 05/21/2020  Medical Rec #: [id number]   Height:    64.0 in  Accession #:  [id number]   Weight:    162.2 lb  Date of Birth: [birth year]   BSA:     1.790 m  Patient Age:  89 years    BP:      132/74 mmHg  Patient Gender: M       HR:      73 bpm.  Exam Location: [facility]   Procedure: 2D Echo, Cardiac Doppler and Color Doppler   Indications:  R06.02 SOB; R06.9 DOE; R53.83 Fatigue    History:    Patient has no prior history of Echocardiogram  examinations and         Patient has prior history of Echocardiogram examinations,  most         recent 11/19/2019. History of intracranial hemorrhage.,         Arrythmias:Atrial Fibrillation, Signs/Symptoms:Shortness  of         Breath and Dyspnea; Risk Factors:Hypertension. 04/22/20 ER         visit, he was found to be in A-fib with RVR and  hypotensive. It         was felt that he was dehydrated.         SOB has been steadily worsening.    Sonographer:  Vella KohlerMelissa Church BS, RVT, RDCS  Referring Phys: [phone] [name]   IMPRESSIONS    1. Left ventricular ejection fraction, by estimation, is 55  to 60%. The  left ventricle has normal function. The left ventricle has no regional  wall motion abnormalities. There is moderate left  ventricular hypertrophy.  Left ventricular diastolic  parameters are indeterminate.  2. Right ventricular systolic function is mildly reduced. The right  ventricular size is moderately enlarged.  3. Left atrial size was mildly dilated.  4. Right atrial size was mildly dilated.  5. Moderate pleural effusion in the left lateral region.  6. Prolapse of the posterior MV leaflet with very eccentric anteriorally  directed MR. The eccentricity of the jet may lead to underestimation of  MR. The MV/AV VTI ratio is around 1 suggesting moderate MR. . The mitral  valve is abnormal. Moderate mitral  valve regurgitation. No evidence of mitral stenosis.  7. Tricuspid valve regurgitation is mild to moderate.  8. The aortic valve is tricuspid. There is moderate calcification of the  aortic valve. There is moderate thickening of the aortic valve. Aortic  valve regurgitation is mild.  9. Moderate pulmonary HTN, PASP is 57 mmHg.  10. The inferior vena cava is dilated in size with <50% respiratory  variability, suggesting right atrial pressure of 15 mmHg.   Comparison(s): Prior Echo at [facility] showed LV EF 60-65%, no RWMA, mild  LVH, mild LAE, tricuspid AoV with mild AI, mild MR.   FINDINGS  Left Ventricle: LVOT VTI appears underestimated. Left ventricular  ejection fraction, by estimation, is 55 to 60%. The left ventricle has  normal function. The left ventricle has no regional wall motion  abnormalities. Global longitudinal strain performed  but not reported based on interpreter judgement due to suboptimal  tracking. The left ventricular internal cavity size was normal in size.  There is moderate left ventricular hypertrophy. Left ventricular diastolic  parameters are indeterminate.   Right Ventricle: The right ventricular size is moderately enlarged. No   increase in right ventricular wall thickness. Right ventricular systolic  function is mildly reduced.   Left Atrium: Left atrial size was mildly dilated.   Right Atrium: Right atrial size was mildly dilated.   Pericardium: There is no evidence of pericardial effusion.   Mitral Valve: Prolapse of the posterior MV leaflet with very eccentric  anteriorally directed MR. The eccentricity of the jet may lead to  underestimation of MR. The MV/AV VTI ratio is around 1 suggesting moderate  MR. The mitral valve is abnormal. Moderate  mitral valve regurgitation. No evidence of mitral valve stenosis.   Tricuspid Valve: The tricuspid valve is normal in structure. Tricuspid  valve regurgitation is mild to moderate. No evidence of tricuspid  stenosis.   Aortic Valve: The aortic valve is tricuspid. There is moderate  calcification of the aortic valve. There is moderate thickening of the  aortic valve. There is moderate aortic valve annular calcification. Aortic  valve regurgitation is mild. Aortic  regurgitation PHT measures 688 msec. Aortic valve mean gradient measures  5.7 mmHg. Aortic valve peak gradient measures 13.0 mmHg. Aortic valve  area, by VTI measures 1.70 cm.   Pulmonic Valve: The pulmonic valve was not well visualized. Pulmonic valve  regurgitation is not visualized. No evidence of pulmonic stenosis.   Aorta: The aortic root is normal in size and structure.   Pulmonary Artery: Moderate pulmonary HTN, PASP is 57 mmHg.   Venous: The inferior vena cava is dilated in size with less than 50%  respiratory variability, suggesting right atrial pressure of 15 mmHg.   IAS/Shunts: The interatrial septum was not well visualized.   Additional Comments: There is a moderate pleural effusion in the left  lateral region.     LEFT VENTRICLE  PLAX 2D  LVIDd:  3.49 cm   Diastology  LVIDs:     2.56 cm   LV e' medial:  8.92 cm/s  LV PW:     1.43 cm   LV E/e' medial:  19.2  LV IVS:    1.56 cm   LV e' lateral:  9.14 cm/s  LVOT diam:   2.30 cm   LV E/e' lateral: 18.7  LV SV:     52  LV SV Index:  29  LVOT Area:   4.15 cm    LV Volumes (MOD)  LV vol d, MOD A2C: 72.1 ml  LV vol d, MOD A4C: 64.3 ml  LV vol s, MOD A2C: 31.2 ml  LV vol s, MOD A4C: 25.6 ml  LV SV MOD A2C:   40.9 ml  LV SV MOD A4C:   64.3 ml  LV SV MOD BP:   43.4 ml   RIGHT VENTRICLE  RV Basal diam: 3.98 cm  RV Mid diam:  2.62 cm  RV S prime:   14.70 cm/s  TAPSE (M-mode): 1.5 cm   LEFT ATRIUM       Index    RIGHT ATRIUM      Index  LA diam:    4.60 cm 2.57 cm/m RA Area:   19.30 cm  LA Vol (A2C):  63.6 ml 35.54 ml/m RA Volume:  51.80 ml 28.94 ml/m  LA Vol (A4C):  51.5 ml 28.78 ml/m  LA Biplane Vol: 58.1 ml 32.46 ml/m  AORTIC VALVE          PULMONIC VALVE  AV Area (Vmax):  1.86 cm   PV Vmax:    1.07 m/s  AV Area (Vmean):  2.10 cm   PV Peak grad: 4.6 mmHg  AV Area (VTI):   1.70 cm  AV Vmax:      180.16 cm/s  AV Vmean:     110.806 cm/s  AV VTI:      0.308 m  AV Peak Grad:   13.0 mmHg  AV Mean Grad:   5.7 mmHg  LVOT Vmax:     80.60 cm/s  LVOT Vmean:    56.100 cm/s  LVOT VTI:     0.126 m  LVOT/AV VTI ratio: 0.41  AI PHT:      688 msec  AR Vena Contracta: 0.40 cm    AORTA  Ao Root diam: 3.70 cm  Ao Asc diam: 3.40 cm   MR Peak grad:  98.4 mmHg  TRICUSPID VALVE  MR Mean grad:  66.0 mmHg  TR Peak grad:  42.5 mmHg  MR Vmax:     496.00 cm/s TR Vmax:    326.00 cm/s  MR Vmean:    387.0 cm/s  MR PISA:     1.57 cm  SHUNTS  MR PISA Eff ROA: 7 mm    Systemic VTI: 0.13 m  MR PISA Radius: 0.50 cm   Systemic Diam: 2.30 cm  MV E velocity: 171.00 cm/s  MV A velocity: 35.80 cm/s  MV E/A ratio: 4.78   Dina Rich MD  Electronically signed by Dina Rich MD  Signature Date/Time: 05/21/2020/10:45:05 AM      TRANSESOPHOGEAL ECHO REPORT       Patient Name:  Adrian [name] Date of Exam: 05/22/2020  Medical Rec #: [id number]   Height:    67.0 in  Accession #:  [id number]   Weight:    155.0 lb  Date of Birth: 25-Nov-1927   BSA:     1.815 m  Patient Age:  89 years    BP:      159/102 mmHg  Patient Gender: M       HR:      66 bpm.  Exam Location: Inpatient   Procedure: Transesophageal Echo and 3D Echo   Indications:   Mitral valve disease    History:     Patient has prior history of Echocardiogram examinations,  most          recent 05/21/2020. Risk Factors:Non-Smoker and Sleep  Apnea.    Sonographer:   Renella Cunas RDCS  Referring Phys: [id number] BRIDGETTE CHRISTOPHER  Diagnosing Phys: Jodelle Red MD   PROCEDURE: The transesophogeal probe was passed without difficulty through  the esophogus of the patient. Sedation performed by different physician.  The patient was monitored while under deep sedation. Anesthestetic  sedation was provided intravenously by  Anesthesiology: 294.  of Propofol. The patient developed no  complications during the procedure.   IMPRESSIONS    1. Left ventricular ejection fraction, by estimation, is 55 to 60%. The  left ventricle has normal function.  2. Right ventricular systolic function is normal. The right ventricular  size is normal.  3. No left atrial/left atrial appendage thrombus was detected.  4. There is severe prolapse of P2 leaflet. 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 The MR jet is  very eccentric and difficult to quantitate, but favor severe  regurgitation.. The mitral valve is  abnormal. Moderate to severe mitral valve regurgitation. No evidence of  mitral stenosis.  5. Tricuspid valve regurgitation is mild to moderate.  6. The aortic valve is tricuspid. Aortic valve regurgitation is mild. No   aortic stenosis is present.   Conclusion(s)/Recommendation(s): Findings and images discussed with Drs.  Skains and O'Neal. 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 Very eccentric MR jet. Given anatomy of valve  and appearance of jet, favor severe  eccentric mitral regurgitation. Of note, he was hypotensive and required  phenylephrine during anesthesia. This may also affect the appearance of  mitral regurgitation, underestimating it on these images.   FINDINGS  Left Ventricle: Left ventricular ejection fraction, by estimation, is 55  to 60%. The left ventricle has normal function. The left ventricular  internal cavity size was normal in size.   Right Ventricle: The right ventricular size is normal. No increase in  right ventricular wall thickness. Right ventricular systolic function is  normal.   Left Atrium: Left atrial size was not assessed. No left atrial/left atrial  appendage thrombus was detected.   Right Atrium: Right atrial size was not assessed.   Pericardium: Trivial pericardial effusion is present.   Mitral Valve: There is severe prolapse of P2 leaflet. On transgastric  images and 3D (images 62 -70), there appears to be a cleft adjacent to P2.  The MR jet is very eccentric and difficult to quantitate, but favor severe  regurgitation. The mitral valve is  abnormal. Moderate to severe mitral valve regurgitation. No evidence of  mitral valve stenosis.   Tricuspid Valve: The tricuspid valve is normal in structure. Tricuspid  valve regurgitation is mild to moderate.   Aortic Valve: The aortic valve is tricuspid. Aortic valve regurgitation is  mild. No aortic stenosis is present.   Pulmonic Valve: The pulmonic valve was normal in structure. Pulmonic valve  regurgitation is mild.   Aorta: The aortic root and ascending aorta are structurally normal, with  no evidence  of dilitation.   IAS/Shunts: No atrial level shunt detected  by color flow Doppler.   Jodelle Red MD  Electronically signed by Jodelle Red MD  Signature Date/Time: 05/22/2020/3:47:24 PM      RIGHT/LEFT HEART CATH AND CORONARY ANGIOGRAPHY    Conclusion    Dist LAD lesion is 80% stenosed.  Mid LAD-1 lesion is 30% stenosed.  Mid LAD-2 lesion is 30% stenosed.   1. Mild disease in the mid LAD. The distal LAD has a severe lesion just before the vessel wraps around the apex.  2. No obstructive disease in the dominant Circumflex and RCA     Recommendations  Antiplatelet/Anticoag Will continue planning for potential mitral valve procedure. Medical management of CAD. The lesion in the LAD is in the distal vessel just before the apex and only a small amount of myocardium is at risk.   Surgeon Notes    05/22/2020 3:23 PM CV Procedure addendum by Jodelle Red, MD    Indications  Severe mitral regurgitation [I34.0 (ICD-10-CM)]   Procedural Details  Technical Details Indication: Severe mitral regurgitation  Procedure: The risks, benefits, complications, treatment options, and expected outcomes were discussed with the patient. The patient and/or family concurred with the proposed plan, giving informed consent. The patient was brought to the cath lab after IV hydration was given. The patient was  sedated with Versed and Fentanyl. The IV catheter in the right antecubital vein was changed for a 5 [place] sheath. Right heart catheterization was performed with a balloon tipped catheter. I was unable to pass a wire from his right radial artery beyond his elbow. No sheath was placed. The right groin was prepped and draped in the usual manner. Using the modified Seldinger access technique, a 5 French sheath was placed in the right femoral artery. Standard diagnostic catheters were used to perform selective coronary angiography. A EBU 4.0 guiding catheter was used to engage the left main artery. LV pressures measured with  the JR4 catheter.   There were no immediate complications. The patient was taken to the recovery area in stable condition.   Estimated blood loss <50 mL.   During this procedure medications were administered to achieve and maintain moderate conscious sedation while the patient's heart rate, blood pressure, and oxygen saturation were continuously monitored and I was present face-to-face 100% of this time.   Medications (Filter: Administrations occurring from 1512 to 1636 on 06/03/20)  lidocaine (PF) (XYLOCAINE) 1 % injection (mL) Total volume:  14 mL  Date/Time Rate/Dose/Volume Action   06/03/20 1536 2 mL Given   1536 2 mL Given   1552 10 mL Given    Heparin (Porcine) in NaCl 1000-0.9 UT/500ML-% SOLN (mL) Total volume:  1,000 mL  Date/Time Rate/Dose/Volume Action   06/03/20 1539 500 mL Given   1539 500 mL Given    fentaNYL (SUBLIMAZE) injection (mcg) Total dose:  25 mcg  Date/Time Rate/Dose/Volume Action   06/03/20 1539 25 mcg Given    midazolam (VERSED) injection (mg) Total dose:  0.5 mg  Date/Time Rate/Dose/Volume Action   06/03/20 1539 0.5 mg Given    iohexol (OMNIPAQUE) 350 MG/ML injection (mL) Total volume:  90 mL  Date/Time Rate/Dose/Volume Action   06/03/20 1618 90 mL Given     Sedation Time  Sedation Time Physician-1: 40 minutes 47 seconds   Contrast  Medication Name Total Dose  iohexol (OMNIPAQUE) 350 MG/ML injection 90 mL    Radiation/Fluoro  Fluoro time: 12.8 (min) DAP: 14419 (mGycm2) Cumulative Air Kerma: 260 (mGy)  Complications   Complications documented before study signed (06/03/2020 4:36 PM)     RIGHT/LEFT HEART CATH AND CORONARY ANGIOGRAPHY  None Documented by Kathleene Hazel, MD 06/03/2020 4:29 PM  Date Found: 06/03/2020  Time Range: Intraprocedure       Coronary Findings   Diagnostic Dominance: Co-dominant  Left Anterior Descending  Mid LAD-1 lesion is 30% stenosed.  Mid LAD-2 lesion is 30% stenosed.   Dist LAD lesion is 80% stenosed.  Left Circumflex  Vessel is large.  Right Coronary Artery  Vessel is moderate in size.   Intervention   No interventions have been documented.  Coronary Diagrams   Diagnostic Dominance: Co-dominant    Intervention      Impression:  Patient has mitral valve prolapse with stage D severe symptomatic primary mitral regurgitation.  He describes a 2-year history of progressive symptoms of exertional shortness of breath with fairly sudden recent acute exacerbation that developed over the last 6 to 8 weeks, culminating in a hospital admission with class IV diastolic congestive heart failure and pulmonary edema.  He has improved with diuretic therapy and currently describes stable class III symptoms of diastolic congestive heart failure.  I have personally reviewed the patient's recent transthoracic and transesophageal echocardiograms as well as his diagnostic cardiac catheterization.  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  There is severe mitral regurgitation.  There is some degree of leaflet thickening and fibrosis but overall the leaflets move reasonably well and there does not appear to be excessive amount of dystrophic calcification.  Left ventricular systolic function remains preserved.  I agree the patient would benefit from mitral valve repair.  However, I would not consider this elderly gentleman a candidate for conventional surgery under any circumstances because of his extremely advanced age and severely limited mobility with borderline functional status.     Plan:  The patient and his daughter-in-law were counseled at length regarding treatment alternatives for management of severe symptomatic mitral regurgitation.  Alternative approaches such as conventional surgical mitral valve repair  or replacement with or without minimally-invasive techniques, percutaneous edge-to-edge Mitraclip repair, and continued medical therapy without intervention were compared and contrasted at length.  The risks associated with conventional surgery were discussed in detail, as were expectations for post-operative convalescence, and why I would not consider this patient a candidate for conventional surgery.  Issues specific to Mitraclip repair were discussed including questions about long term freedom from persistent or recurrent mitral regurgitation, the potential for device migration or embolization, and other technical complications related to the procedure itself.  Current availability of other catheter-based treatments for mitral regurgitation was discussed.  Long-term prognosis with medical therapy was discussed. This discussion was placed in the context of the patient's own specific clinical presentation and past medical history.  All of their questions have been addressed.  The patient desires to proceed with transcatheter edge-to-edge repair later this week as previously planned.    I spent in excess of 90 minutes during the conduct of this office consultation and >50% of this time involved direct face-to-face encounter with the patient for counseling and/or coordination of their care.      Salvatore Decent. Cornelius Moras, MD 06/15/2020 11:34 AM

## 2020-06-15 NOTE — Patient Instructions (Signed)

## 2020-06-16 ENCOUNTER — Encounter (HOSPITAL_COMMUNITY): Payer: Self-pay

## 2020-06-16 ENCOUNTER — Other Ambulatory Visit (HOSPITAL_COMMUNITY): Payer: Medicare HMO

## 2020-06-16 ENCOUNTER — Ambulatory Visit (HOSPITAL_COMMUNITY)
Admission: RE | Admit: 2020-06-16 | Discharge: 2020-06-16 | Disposition: A | Discharge status: Home / Self Care | Payer: Medicare HMO | Source: Ambulatory Visit | Attending: Cardiovascular Disease | Admitting: Cardiovascular Disease

## 2020-06-16 ENCOUNTER — Other Ambulatory Visit: Payer: Self-pay

## 2020-06-16 ENCOUNTER — Encounter (HOSPITAL_COMMUNITY)
Admission: RE | Admit: 2020-06-16 | Discharge: 2020-06-16 | Disposition: A | Discharge status: Home / Self Care | Payer: Medicare HMO | Source: Ambulatory Visit | Attending: Cardiovascular Disease | Admitting: Cardiovascular Disease

## 2020-06-16 DIAGNOSIS — I340 Nonrheumatic mitral (valve) insufficiency: Secondary | ICD-10-CM

## 2020-06-16 DIAGNOSIS — Z20822 Contact with and (suspected) exposure to covid-19: Secondary | ICD-10-CM | POA: Insufficient documentation

## 2020-06-16 DIAGNOSIS — I4891 Unspecified atrial fibrillation: Secondary | ICD-10-CM | POA: Insufficient documentation

## 2020-06-16 DIAGNOSIS — I5032 Chronic diastolic (congestive) heart failure: Secondary | ICD-10-CM | POA: Diagnosis not present

## 2020-06-16 DIAGNOSIS — Z006 Encounter for examination for normal comparison and control in clinical research program: Secondary | ICD-10-CM | POA: Diagnosis not present

## 2020-06-16 DIAGNOSIS — Z01818 Encounter for other preprocedural examination: Secondary | ICD-10-CM | POA: Insufficient documentation

## 2020-06-16 LAB — CBC
HCT: 37.6 % — ABNORMAL LOW (ref 39.0–52.0)
Hemoglobin: 12 g/dL — ABNORMAL LOW (ref 13.0–17.0)
MCH: 30.2 pg (ref 26.0–34.0)
MCHC: 31.9 g/dL (ref 30.0–36.0)
MCV: 94.5 fL (ref 80.0–100.0)
Platelets: 287 10*3/uL (ref 150–400)
RBC: 3.98 MIL/uL — ABNORMAL LOW (ref 4.22–5.81)
RDW: 13.9 % (ref 11.5–15.5)
WBC: 7.7 10*3/uL (ref 4.0–10.5)
nRBC: 0 % (ref 0.0–0.2)

## 2020-06-16 LAB — COMPREHENSIVE METABOLIC PANEL
ALT: 22 U/L (ref 0–44)
AST: 25 U/L (ref 15–41)
Albumin: 3.6 g/dL (ref 3.5–5.0)
Alkaline Phosphatase: 116 U/L (ref 38–126)
Anion gap: 7 (ref 5–15)
BUN: 21 mg/dL (ref 8–23)
CO2: 20 mmol/L — ABNORMAL LOW (ref 22–32)
Calcium: 8.8 mg/dL — ABNORMAL LOW (ref 8.9–10.3)
Chloride: 105 mmol/L (ref 98–111)
Creatinine, Ser: 1.34 mg/dL — ABNORMAL HIGH (ref 0.61–1.24)
GFR, Estimated: 50 mL/min — ABNORMAL LOW (ref >60)
Glucose, Bld: 96 mg/dL (ref 70–99)
Potassium: 4.6 mmol/L (ref 3.5–5.1)
Sodium: 132 mmol/L — ABNORMAL LOW (ref 135–145)
Total Bilirubin: 0.9 mg/dL (ref 0.3–1.2)
Total Protein: 6.5 g/dL (ref 6.5–8.1)

## 2020-06-16 LAB — URINALYSIS, ROUTINE W REFLEX MICROSCOPIC
Bilirubin Urine: NEGATIVE
Glucose, UA: NEGATIVE mg/dL
Hgb urine dipstick: NEGATIVE
Ketones, ur: NEGATIVE mg/dL
Leukocytes,Ua: NEGATIVE
Nitrite: NEGATIVE
Protein, ur: NEGATIVE mg/dL
Specific Gravity, Urine: 1.006 (ref 1.005–1.030)
pH: 6 (ref 5.0–8.0)

## 2020-06-16 LAB — BLOOD GAS, ARTERIAL
Acid-base deficit: 1.7 mmol/L (ref 0.0–2.0)
Bicarbonate: 22.3 mmol/L (ref 20.0–28.0)
Drawn by: 602861
FIO2: 21
O2 Saturation: 97.1 %
Patient temperature: 37
pCO2 arterial: 36.1 mmHg (ref 32.0–48.0)
pH, Arterial: 7.407 (ref 7.350–7.450)
pO2, Arterial: 98 mmHg (ref 83.0–108.0)

## 2020-06-16 LAB — TYPE AND SCREEN
ABO/RH(D): A POS
Antibody Screen: NEGATIVE

## 2020-06-16 LAB — PROTIME-INR
INR: 1.1 (ref 0.8–1.2)
Prothrombin Time: 13.7 seconds (ref 11.4–15.2)

## 2020-06-16 LAB — SURGICAL PCR SCREEN
MRSA, PCR: NEGATIVE
Staphylococcus aureus: NEGATIVE

## 2020-06-16 LAB — SARS CORONAVIRUS 2 (TAT 6-24 HRS): SARS Coronavirus 2: NEGATIVE

## 2020-06-16 NOTE — Progress Notes (Signed)
PCP: [facility] Cardiologist: Tonny Bollman, MD   EKG: 06/16/20 CXR: 06/16/20 ECHO: 05/22/20 Stress Test: 11/22/19 Cardiac Cath: 06/03/20  Fasting Blood Sugar- na Checks Blood Sugar__na_ times a day  OSA/CPAP:  Yes, wears nightly  ASA:  Will call surgeon to see if needs to discontinue or continue  Covid test 06/16/20  Anesthesia Review: yes, cardiac history  Patient denies shortness of breath, fever, cough, and chest pain at PAT appointment.  Patient verbalized understanding of instructions provided today at the PAT appointment.  Patient asked to review instructions at home and day of surgery.

## 2020-06-18 ENCOUNTER — Inpatient Hospital Stay (HOSPITAL_COMMUNITY)
Admission: RE | Admit: 2020-06-18 | Discharge: 2020-06-19 | DRG: 267 | Disposition: A | Discharge status: Nursing Home (Includes ICF) | Payer: Medicare HMO | Attending: Cardiovascular Disease | Admitting: Cardiovascular Disease

## 2020-06-18 ENCOUNTER — Inpatient Hospital Stay (HOSPITAL_COMMUNITY): Payer: Medicare HMO | Admitting: Anesthesiology

## 2020-06-18 ENCOUNTER — Other Ambulatory Visit: Payer: Self-pay

## 2020-06-18 ENCOUNTER — Inpatient Hospital Stay (HOSPITAL_COMMUNITY): Payer: Medicare HMO | Admitting: Physician Assistant

## 2020-06-18 ENCOUNTER — Encounter (HOSPITAL_COMMUNITY): Payer: Self-pay | Admitting: Cardiovascular Disease

## 2020-06-18 ENCOUNTER — Encounter (HOSPITAL_COMMUNITY)
Admission: RE | Disposition: A | Discharge status: Nursing Home (Includes ICF) | Payer: Self-pay | Source: Home / Self Care | Attending: Cardiovascular Disease

## 2020-06-18 ENCOUNTER — Inpatient Hospital Stay (HOSPITAL_COMMUNITY): Payer: Medicare HMO

## 2020-06-18 ENCOUNTER — Other Ambulatory Visit: Payer: Self-pay | Admitting: Physician Assistant

## 2020-06-18 DIAGNOSIS — Z006 Encounter for examination for normal comparison and control in clinical research program: Secondary | ICD-10-CM

## 2020-06-18 DIAGNOSIS — K219 Gastro-esophageal reflux disease without esophagitis: Secondary | ICD-10-CM | POA: Diagnosis present

## 2020-06-18 DIAGNOSIS — Z95818 Presence of other cardiac implants and grafts: Secondary | ICD-10-CM

## 2020-06-18 DIAGNOSIS — Z8673 Personal history of transient ischemic attack (TIA), and cerebral infarction without residual deficits: Secondary | ICD-10-CM

## 2020-06-18 DIAGNOSIS — Z8679 Personal history of other diseases of the circulatory system: Secondary | ICD-10-CM

## 2020-06-18 DIAGNOSIS — I340 Nonrheumatic mitral (valve) insufficiency: Secondary | ICD-10-CM

## 2020-06-18 DIAGNOSIS — Z7982 Long term (current) use of aspirin: Secondary | ICD-10-CM

## 2020-06-18 DIAGNOSIS — I2510 Atherosclerotic heart disease of native coronary artery without angina pectoris: Secondary | ICD-10-CM | POA: Diagnosis present

## 2020-06-18 DIAGNOSIS — G4733 Obstructive sleep apnea (adult) (pediatric): Secondary | ICD-10-CM | POA: Diagnosis present

## 2020-06-18 DIAGNOSIS — Q211 Atrial septal defect: Secondary | ICD-10-CM | POA: Diagnosis not present

## 2020-06-18 DIAGNOSIS — I272 Pulmonary hypertension, unspecified: Secondary | ICD-10-CM | POA: Diagnosis present

## 2020-06-18 DIAGNOSIS — R0902 Hypoxemia: Secondary | ICD-10-CM | POA: Diagnosis not present

## 2020-06-18 DIAGNOSIS — Z79899 Other long term (current) drug therapy: Secondary | ICD-10-CM

## 2020-06-18 DIAGNOSIS — I4811 Longstanding persistent atrial fibrillation: Secondary | ICD-10-CM | POA: Diagnosis present

## 2020-06-18 DIAGNOSIS — I5032 Chronic diastolic (congestive) heart failure: Secondary | ICD-10-CM | POA: Diagnosis present

## 2020-06-18 DIAGNOSIS — Z9889 Other specified postprocedural states: Secondary | ICD-10-CM | POA: Diagnosis not present

## 2020-06-18 DIAGNOSIS — Z954 Presence of other heart-valve replacement: Secondary | ICD-10-CM | POA: Diagnosis not present

## 2020-06-18 DIAGNOSIS — Z20822 Contact with and (suspected) exposure to covid-19: Secondary | ICD-10-CM | POA: Diagnosis present

## 2020-06-18 DIAGNOSIS — I110 Hypertensive heart disease with heart failure: Secondary | ICD-10-CM | POA: Diagnosis present

## 2020-06-18 DIAGNOSIS — I4819 Other persistent atrial fibrillation: Secondary | ICD-10-CM | POA: Diagnosis present

## 2020-06-18 HISTORY — PX: MITRAL VALVE REPAIR: CATH118311

## 2020-06-18 HISTORY — DX: Personal history of other diseases of the circulatory system: Z86.79

## 2020-06-18 HISTORY — DX: Nonrheumatic mitral (valve) insufficiency: I34.0

## 2020-06-18 HISTORY — DX: Other persistent atrial fibrillation: I48.19

## 2020-06-18 HISTORY — PX: TEE WITHOUT CARDIOVERSION: SHX5443

## 2020-06-18 HISTORY — DX: Other specified postprocedural states: Z98.890

## 2020-06-18 LAB — POCT ACTIVATED CLOTTING TIME
Activated Clotting Time: 261 seconds
Activated Clotting Time: 326 seconds
Activated Clotting Time: 303 seconds

## 2020-06-18 LAB — ABO/RH: ABO/RH(D): A POS

## 2020-06-18 LAB — ECHO TEE
Area-P 1/2: 2.1 cm2
P 1/2 time: 497 msec

## 2020-06-18 SURGERY — MITRAL VALVE REPAIR
Anesthesia: General

## 2020-06-18 MED ORDER — LACTATED RINGERS IV SOLN: INTRAVENOUS | Status: DC | PRN

## 2020-06-18 MED ORDER — HEPARIN (PORCINE) IN NACL 1000-0.9 UT/500ML-% IV SOLN
INTRAVENOUS | Status: DC | PRN
  Administered 2020-06-18 (×3): 500 mL

## 2020-06-18 MED ORDER — LIDOCAINE 2% (20 MG/ML) 5 ML SYRINGE
INTRAMUSCULAR | Status: DC | PRN
  Administered 2020-06-18: 40 mg via INTRAVENOUS

## 2020-06-18 MED ORDER — ADULT MULTIVITAMIN W/MINERALS CH
1.0000 | ORAL_TABLET | Freq: Every day | ORAL | Status: DC
  Administered 2020-06-19: 1 via ORAL
  Filled 2020-06-18: qty 1

## 2020-06-18 MED ORDER — FUROSEMIDE 40 MG PO TABS
40.0000 mg | ORAL_TABLET | Freq: Every day | ORAL | Status: DC
  Administered 2020-06-19: 40 mg via ORAL
  Filled 2020-06-18: qty 1

## 2020-06-18 MED ORDER — SODIUM CHLORIDE 0.9 % IV SOLN
1.5000 g | INTRAVENOUS | Status: AC
  Administered 2020-06-18: 1.5 g via INTRAVENOUS
  Filled 2020-06-18: qty 1.5

## 2020-06-18 MED ORDER — GUAIFENESIN ER 600 MG PO TB12
600.0000 mg | ORAL_TABLET | Freq: Two times a day (BID) | ORAL | Status: DC
  Administered 2020-06-18 – 2020-06-19 (×2): 600 mg via ORAL
  Filled 2020-06-18 (×2): qty 1

## 2020-06-18 MED ORDER — CHLORHEXIDINE GLUCONATE 0.12 % MT SOLN
15.0000 mL | Freq: Once | OROMUCOSAL | Status: AC
  Administered 2020-06-18: 15 mL via OROMUCOSAL
  Filled 2020-06-18: qty 15

## 2020-06-18 MED ORDER — DEXAMETHASONE SODIUM PHOSPHATE 10 MG/ML IJ SOLN
INTRAMUSCULAR | Status: DC | PRN
  Administered 2020-06-18: 8 mg via INTRAVENOUS

## 2020-06-18 MED ORDER — SODIUM CHLORIDE 0.9% FLUSH: 3.0000 mL | INTRAVENOUS | Status: DC | PRN

## 2020-06-18 MED ORDER — SERTRALINE HCL 25 MG PO TABS
25.0000 mg | ORAL_TABLET | Freq: Every day | ORAL | Status: DC
  Administered 2020-06-18: 25 mg via ORAL
  Filled 2020-06-18: qty 1

## 2020-06-18 MED ORDER — HYDRALAZINE HCL 20 MG/ML IJ SOLN: 5.0000 mg | INTRAMUSCULAR | Status: DC | PRN

## 2020-06-18 MED ORDER — FENTANYL CITRATE (PF) 250 MCG/5ML IJ SOLN
INTRAMUSCULAR | Status: DC | PRN
  Administered 2020-06-18: 50 ug via INTRAVENOUS

## 2020-06-18 MED ORDER — ONDANSETRON HCL 4 MG/2ML IJ SOLN: 4.0000 mg | Freq: Four times a day (QID) | INTRAMUSCULAR | Status: DC | PRN

## 2020-06-18 MED ORDER — FLUTICASONE PROPIONATE 50 MCG/ACT NA SUSP
1.0000 | Freq: Every day | NASAL | Status: DC
  Administered 2020-06-19: 1 via NASAL
  Filled 2020-06-18: qty 16

## 2020-06-18 MED ORDER — HEPARIN SODIUM (PORCINE) 1000 UNIT/ML IJ SOLN
INTRAMUSCULAR | Status: DC | PRN
  Administered 2020-06-18: 10000 [IU] via INTRAVENOUS
  Administered 2020-06-18: 4000 [IU] via INTRAVENOUS

## 2020-06-18 MED ORDER — PROTAMINE SULFATE 10 MG/ML IV SOLN
INTRAVENOUS | Status: DC | PRN
  Administered 2020-06-18: 30 mg via INTRAVENOUS

## 2020-06-18 MED ORDER — HEPARIN SODIUM (PORCINE) 5000 UNIT/ML IJ SOLN
5000.0000 [IU] | Freq: Three times a day (TID) | INTRAMUSCULAR | Status: DC
  Administered 2020-06-18 – 2020-06-19 (×2): 5000 [IU] via SUBCUTANEOUS
  Filled 2020-06-18 (×2): qty 1

## 2020-06-18 MED ORDER — HEPARIN (PORCINE) IN NACL 2000-0.9 UNIT/L-% IV SOLN
INTRAVENOUS | Status: AC
  Filled 2020-06-18: qty 3000

## 2020-06-18 MED ORDER — CLOPIDOGREL BISULFATE 75 MG PO TABS
75.0000 mg | ORAL_TABLET | Freq: Every day | ORAL | Status: DC
  Administered 2020-06-19: 75 mg via ORAL
  Filled 2020-06-18: qty 1

## 2020-06-18 MED ORDER — MONTELUKAST SODIUM 10 MG PO TABS
10.0000 mg | ORAL_TABLET | Freq: Every day | ORAL | Status: DC
  Administered 2020-06-18: 10 mg via ORAL
  Filled 2020-06-18: qty 1

## 2020-06-18 MED ORDER — LABETALOL HCL 5 MG/ML IV SOLN: 10.0000 mg | INTRAVENOUS | Status: DC | PRN

## 2020-06-18 MED ORDER — ROCURONIUM BROMIDE 10 MG/ML (PF) SYRINGE
PREFILLED_SYRINGE | INTRAVENOUS | Status: DC | PRN
  Administered 2020-06-18: 10 mg via INTRAVENOUS
  Administered 2020-06-18: 20 mg via INTRAVENOUS
  Administered 2020-06-18: 60 mg via INTRAVENOUS

## 2020-06-18 MED ORDER — HEPARIN (PORCINE) IN NACL 1000-0.9 UT/500ML-% IV SOLN
INTRAVENOUS | Status: AC
  Filled 2020-06-18: qty 500

## 2020-06-18 MED ORDER — PHENYLEPHRINE HCL-NACL 10-0.9 MG/250ML-% IV SOLN
INTRAVENOUS | Status: DC | PRN
  Administered 2020-06-18: 50 ug/min via INTRAVENOUS

## 2020-06-18 MED ORDER — ONDANSETRON HCL 4 MG/2ML IJ SOLN
INTRAMUSCULAR | Status: DC | PRN
  Administered 2020-06-18: 4 mg via INTRAVENOUS

## 2020-06-18 MED ORDER — CHLORHEXIDINE GLUCONATE 4 % EX LIQD: 30.0000 mL | CUTANEOUS | Status: DC

## 2020-06-18 MED ORDER — SODIUM CHLORIDE 0.9% FLUSH
3.0000 mL | Freq: Two times a day (BID) | INTRAVENOUS | Status: DC
  Administered 2020-06-18 – 2020-06-19 (×3): 3 mL via INTRAVENOUS

## 2020-06-18 MED ORDER — SUGAMMADEX SODIUM 200 MG/2ML IV SOLN
INTRAVENOUS | Status: DC | PRN
  Administered 2020-06-18: 200 mg via INTRAVENOUS

## 2020-06-18 MED ORDER — HEPARIN (PORCINE) IN NACL 2000-0.9 UNIT/L-% IV SOLN
INTRAVENOUS | Status: DC | PRN
  Administered 2020-06-18 (×3): 1000 mL

## 2020-06-18 MED ORDER — RIVASTIGMINE TARTRATE 1.5 MG PO CAPS
3.0000 mg | ORAL_CAPSULE | ORAL | Status: DC
  Administered 2020-06-18 – 2020-06-19 (×2): 3 mg via ORAL
  Filled 2020-06-18 (×3): qty 2

## 2020-06-18 MED ORDER — ACETAMINOPHEN 325 MG PO TABS
650.0000 mg | ORAL_TABLET | ORAL | Status: DC | PRN
  Administered 2020-06-18 – 2020-06-19 (×2): 650 mg via ORAL
  Filled 2020-06-18 (×2): qty 2

## 2020-06-18 MED ORDER — SODIUM CHLORIDE 0.9 % IV SOLN: INTRAVENOUS | Status: DC

## 2020-06-18 MED ORDER — CHLORHEXIDINE GLUCONATE 4 % EX LIQD: 60.0000 mL | Freq: Once | CUTANEOUS | Status: DC

## 2020-06-18 MED ORDER — HEPARIN (PORCINE) IN NACL 1000-0.9 UT/500ML-% IV SOLN
INTRAVENOUS | Status: AC
  Filled 2020-06-18: qty 1000

## 2020-06-18 MED ORDER — PROPOFOL 10 MG/ML IV BOLUS
INTRAVENOUS | Status: DC | PRN
  Administered 2020-06-18: 90 mg via INTRAVENOUS

## 2020-06-18 MED ORDER — SODIUM CHLORIDE 0.9 % IV SOLN: 250.0000 mL | INTRAVENOUS | Status: DC | PRN

## 2020-06-18 MED ORDER — ASPIRIN EC 81 MG PO TBEC
81.0000 mg | DELAYED_RELEASE_TABLET | Freq: Every day | ORAL | Status: DC
  Administered 2020-06-19: 81 mg via ORAL
  Filled 2020-06-18: qty 1

## 2020-06-18 MED ORDER — NITROGLYCERIN 0.4 MG SL SUBL: 0.4000 mg | SUBLINGUAL_TABLET | SUBLINGUAL | Status: DC | PRN

## 2020-06-18 MED ORDER — PANTOPRAZOLE SODIUM 40 MG PO TBEC
40.0000 mg | DELAYED_RELEASE_TABLET | Freq: Every day | ORAL | Status: DC
  Administered 2020-06-19: 40 mg via ORAL
  Filled 2020-06-18: qty 1

## 2020-06-18 MED ORDER — VANCOMYCIN HCL 1250 MG/250ML IV SOLN
1250.0000 mg | INTRAVENOUS | Status: AC
  Administered 2020-06-18: 1250 mg via INTRAVENOUS
  Filled 2020-06-18 (×2): qty 250

## 2020-06-18 SURGICAL SUPPLY — 21 items
BLANKET WARM UNDERBOD FULL ACC (MISCELLANEOUS) ×2 IMPLANT
CATH MITRA STEERABLE GUIDE (CATHETERS) ×2 IMPLANT
CLIP MITRA G4 DELIVERY SYS XTW (Clip) ×2 IMPLANT
CLOSURE PERCLOSE PROSTYLE (VASCULAR PRODUCTS) ×4 IMPLANT
ELECT DEFIB PAD ADLT CADENCE (PAD) ×2 IMPLANT
GUIDEWIRE SAFE TJ AMPLATZ EXST (WIRE) ×2 IMPLANT
KIT DILATOR VASC 18G NDL (KITS) ×2 IMPLANT
KIT HEART LEFT (KITS) ×4 IMPLANT
KIT MICROPUNCTURE NIT STIFF (SHEATH) ×2 IMPLANT
KIT VERSACROSS LRG ACCESS (CATHETERS) ×2 IMPLANT
PACK CARDIAC CATHETERIZATION (CUSTOM PROCEDURE TRAY) ×2 IMPLANT
SHEATH PINNACLE 8F 10CM (SHEATH) ×2 IMPLANT
SHEATH PROBE COVER 6X72 (BAG) ×4 IMPLANT
SHIELD RADPAD SCOOP 12X17 (MISCELLANEOUS) ×2 IMPLANT
STOPCOCK MORSE 400PSI 3WAY (MISCELLANEOUS) ×12 IMPLANT
SYSTEM MITRACLIP G4 (SYSTAGENIX WOUND MANAGEMENT) ×2 IMPLANT
TRANSDUCER W/STOPCOCK (MISCELLANEOUS) ×2 IMPLANT
TUBING ART PRESS 72  MALE/FEM (TUBING) ×2
TUBING ART PRESS 72 MALE/FEM (TUBING) ×2 IMPLANT
TUBING CIL FLEX 10 FLL-RA (TUBING) ×2 IMPLANT
WIRE EMERALD 3MM-J .035X150CM (WIRE) ×2 IMPLANT

## 2020-06-18 NOTE — Transfer of Care (Signed)
Immediate Anesthesia Transfer of Care Note  Patient: Adrian [name]  Procedure(s) Performed: MITRAL VALVE REPAIR (N/A ) TRANSESOPHAGEAL ECHOCARDIOGRAM (TEE) (N/A )  Patient Location: Cath Lab  Anesthesia Type:General  Level of Consciousness: awake, alert  and oriented  Airway & Oxygen Therapy: Patient Spontanous Breathing and Patient connected to nasal cannula oxygen  Post-op Assessment: Report given to RN and Post -op Vital signs reviewed and stable  Post vital signs: Reviewed and stable  Last Vitals:  Vitals Value Taken Time  BP 136/63 06/18/20 1054  Temp 36.5 C 06/18/20 1032  Pulse 90 06/18/20 1057  Resp 24 06/18/20 1057  SpO2 100 % 06/18/20 1057  Vitals shown include unvalidated device data.  Last Pain:  Vitals:   06/18/20 1032  TempSrc: Temporal  PainSc: Asleep         Complications: No complications documented.

## 2020-06-18 NOTE — Progress Notes (Signed)
Left radial arterial line d/c'ed; pressure held x 15 minutes; level 0; palpable left radial pulse 2+; gauze and tegaderm dressing applied

## 2020-06-18 NOTE — Anesthesia Postprocedure Evaluation (Signed)
Anesthesia Post Note  Patient: Adrian [name]  Procedure(s) Performed: MITRAL VALVE REPAIR (N/A ) TRANSESOPHAGEAL ECHOCARDIOGRAM (TEE) (N/A )     Patient location during evaluation: SICU Anesthesia Type: General Level of consciousness: awake and alert Pain management: pain level controlled Vital Signs Assessment: post-procedure vital signs reviewed and stable Respiratory status: patient connected to face mask oxygen Cardiovascular status: stable Postop Assessment: no apparent nausea or vomiting Anesthetic complications: no   No complications documented.  Last Vitals:  Vitals:   06/18/20 1200 06/18/20 1215  BP: 112/83 101/61  Pulse: 85 89  Resp: (!) 23 (!) 21  Temp:    SpO2: 90% 93%    Last Pain:  Vitals:   06/18/20 1156  TempSrc:   PainSc: 0-No pain                 Earl Lites P Demarr Kluever

## 2020-06-18 NOTE — Interval H&P Note (Signed)
History and Physical Interval Note:  06/18/2020 7:27 AM  Adrian [name]  has presented today for surgery, with the diagnosis of Severe Mitral Insufficiency.  The various methods of treatment have been discussed with the patient and family. After consideration of risks, benefits and other options for treatment, the patient has consented to  Procedure(s): MITRAL VALVE REPAIR (N/A) TRANSESOPHAGEAL ECHOCARDIOGRAM (TEE) (N/A) as a surgical intervention.  The patient's history has been reviewed, patient examined, no change in status, stable for surgery.  I have reviewed the patient's chart and labs.  Questions were answered to the patient's satisfaction.     Tonny Bollman

## 2020-06-18 NOTE — Anesthesia Procedure Notes (Signed)
Procedure Name: Intubation Date/Time: 06/18/2020 7:58 AM Performed by: Marena Chancy, CRNA Pre-anesthesia Checklist: Patient identified, Emergency Drugs available, Suction available and Patient being monitored Patient Re-evaluated:Patient Re-evaluated prior to induction Oxygen Delivery Method: Circle System Utilized Preoxygenation: Pre-oxygenation with 100% oxygen Induction Type: IV induction Ventilation: Mask ventilation without difficulty and Oral airway inserted - appropriate to patient size Laryngoscope Size: Hyacinth Meeker and 2 Grade View: Grade I Tube type: Oral Tube size: 7.5 mm Number of attempts: 1 Airway Equipment and Method: Stylet and Oral airway Placement Confirmation: ETT inserted through vocal cords under direct vision,  positive ETCO2 and breath sounds checked- equal and bilateral Tube secured with: Tape Dental Injury: Teeth and Oropharynx as per pre-operative assessment

## 2020-06-18 NOTE — Discharge Instructions (Signed)
Home Care Following Your MitraClip Procedure      If you have any questions or concerns you can call the structural heart office at [phone] during normal business hours 8am-4pm. If you have an urgent need after hours or on the weekend, please call [phone] to talk to the on call provider for general cardiology. If you have an emergency that requires immediate attention, please call 911.   Groin Site Care Refer to this sheet in the next few weeks. These instructions provide you with information on caring for yourself after your procedure. Your caregiver may also give you more specific instructions. Your treatment has been planned according to current medical practices, but problems sometimes occur. Call your caregiver if you have any problems or questions after your procedure. HOME CARE INSTRUCTIONS  You may shower 24 hours after the procedure. Remove the bandage (dressing) and gently wash the site with plain soap and water. Gently pat the site dry.   Do not apply powder or lotion to the site.   Do not sit in a bathtub, swimming pool, or whirlpool for 5 to 7 days.   No bending, squatting, or lifting anything over 10 pounds (4.5 kg) as directed by your caregiver.   Inspect the site at least twice daily.   Do not drive home if you are discharged the same day of the procedure. Have someone else drive you.   You may drive 72 hours after the procedure unless otherwise instructed by your caregiver.  What to expect:  Any bruising will usually fade within 1 to 2 weeks.   Blood that collects in the tissue (hematoma) may be painful to the touch. It should usually decrease in size and tenderness within 1 to 2 weeks.  SEEK IMMEDIATE MEDICAL CARE IF:  You have unusual pain at the groin site or down the affected leg.   You have redness, warmth, swelling, or pain at the groin site.   You have drainage (other than a small amount of blood on the dressing).   You have chills.   You have a  fever or persistent symptoms for more than 72 hours.   You have a fever and your symptoms suddenly get worse.   Your leg becomes pale, cool, tingly, or numb.   You have bleeding from the site. Hold pressure on the site until it subsides.    After MitraClip Checklist  Check  Test Description   Follow up appointment in 1-2 weeks  Most of our patients will see our structural heart physician assistant, Adrian [name], or your primary cardiologist within 1-2 weeks. Your incision site will be checked and you will be cleared to resume all normal activities if you are doing well.     1 month echo and follow up  You will have an echo to check on your heart valve clip and be seen back in the office by Adrian Ellis Koffler PA-C.   Follow up with your primary cardiologist You will need to be seen by your primary cardiologist in the following 3-6 months after your 1 month appointment in the valve clinic. Often times your Plavix or Aspirin will be discontinued during this time, but this is decided on a case by case basis.    1 year echo and follow up You will have another echo to check on your heart valve after one year and be seen back in the office by Adrian [name]. This your last structural heart visit.   Bacterial endocarditis prophylaxis  You will   have to take antibiotics for the rest of your life before all dental procedures (even dental cleanings) to protect your heart valve from potential infection. Antibiotics are also required before some surgeries. Please check with your cardiologist before scheduling any surgeries. Also, please make sure to tell us if you have a penicillin allergy as you will require an alternative antibiotic.    ______________  Your Implant Identification Card Following your procedure, you will receive an Implant Identification Card, which your doctor will fill out and which you must carry with you at all times. Show your Implant Identification Card if you report to an emergency room.  This card identifies you as a patient who has had a MitraClip device implanted. If you require a magnetic resonance imaging (MRI) scan, tell your doctor or MRI technician that you have a MitraClip device implanted. Test results indicate that patients with the MitraClip device can safely undergo MRI scans under certain conditions described on the card.  

## 2020-06-18 NOTE — Plan of Care (Signed)

## 2020-06-18 NOTE — Progress Notes (Signed)
  Echocardiogram Echocardiogram Transesophageal 3D has been performed.  Leta Jungling M 06/18/2020, 10:49 AM

## 2020-06-18 NOTE — Anesthesia Procedure Notes (Signed)
Arterial Line Insertion Start/End4/09/2020 6:55 AM, 06/18/2020 7:00 AM Performed by: CRNA  Patient location: Pre-op. Preanesthetic checklist: patient identified, IV checked, site marked, risks and benefits discussed, surgical consent, monitors and equipment checked, pre-op evaluation, timeout performed and anesthesia consent Lidocaine 1% used for infiltration Left, radial was placed Catheter size: 20 G Hand hygiene performed  and maximum sterile barriers used   Attempts: 1 Procedure performed without using ultrasound guided technique. Following insertion, dressing applied and Biopatch. Post procedure assessment: normal  Patient tolerated the procedure well with no immediate complications.

## 2020-06-18 NOTE — Anesthesia Preprocedure Evaluation (Signed)
Anesthesia Evaluation  Patient identified by MRN, date of birth, ID band Patient awake    Reviewed: Allergy & Precautions, NPO status , Patient's Chart, lab work & pertinent test results  Airway Mallampati: II  TM Distance: >3 FB Neck ROM: Full    Dental  (+) Poor Dentition, Dental Advisory Given   Pulmonary   Few crackles at bases     rales    Cardiovascular  Rhythm:Irregular Rate:Normal     Neuro/Psych    GI/Hepatic   Endo/Other    Renal/GU      Musculoskeletal   Abdominal   Peds  Hematology   Anesthesia Other Findings   Reproductive/Obstetrics                             Anesthesia Physical Anesthesia Plan  ASA: III  Anesthesia Plan: General   Post-op Pain Management:    Induction: Intravenous  PONV Risk Score and Plan: Ondansetron and Dexamethasone  Airway Management Planned: Oral ETT  Additional Equipment: Arterial line  Intra-op Plan:   Post-operative Plan: Extubation in OR  Informed Consent: I have reviewed the patients History and Physical, chart, labs and discussed the procedure including the risks, benefits and alternatives for the proposed anesthesia with the patient or authorized representative who has indicated his/her understanding and acceptance.     Dental advisory given  Plan Discussed with: CRNA and Anesthesiologist  Anesthesia Plan Comments:         Anesthesia Quick Evaluation

## 2020-06-19 ENCOUNTER — Inpatient Hospital Stay (HOSPITAL_COMMUNITY): Payer: Medicare HMO

## 2020-06-19 ENCOUNTER — Other Ambulatory Visit (HOSPITAL_COMMUNITY): Payer: Self-pay

## 2020-06-19 DIAGNOSIS — Z9889 Other specified postprocedural states: Secondary | ICD-10-CM

## 2020-06-19 DIAGNOSIS — Z20822 Contact with and (suspected) exposure to covid-19: Secondary | ICD-10-CM | POA: Diagnosis not present

## 2020-06-19 DIAGNOSIS — I5032 Chronic diastolic (congestive) heart failure: Secondary | ICD-10-CM

## 2020-06-19 DIAGNOSIS — Z95818 Presence of other cardiac implants and grafts: Secondary | ICD-10-CM

## 2020-06-19 DIAGNOSIS — Z954 Presence of other heart-valve replacement: Secondary | ICD-10-CM | POA: Diagnosis not present

## 2020-06-19 DIAGNOSIS — I340 Nonrheumatic mitral (valve) insufficiency: Secondary | ICD-10-CM | POA: Diagnosis not present

## 2020-06-19 DIAGNOSIS — Z006 Encounter for examination for normal comparison and control in clinical research program: Secondary | ICD-10-CM | POA: Diagnosis not present

## 2020-06-19 LAB — CBC
HCT: 32.6 % — ABNORMAL LOW (ref 39.0–52.0)
Hemoglobin: 10.5 g/dL — ABNORMAL LOW (ref 13.0–17.0)
MCH: 29.4 pg (ref 26.0–34.0)
MCHC: 32.2 g/dL (ref 30.0–36.0)
MCV: 91.3 fL (ref 80.0–100.0)
Platelets: 222 10*3/uL (ref 150–400)
RBC: 3.57 MIL/uL — ABNORMAL LOW (ref 4.22–5.81)
RDW: 13.7 % (ref 11.5–15.5)
WBC: 8.4 10*3/uL (ref 4.0–10.5)
nRBC: 0 % (ref 0.0–0.2)

## 2020-06-19 LAB — BASIC METABOLIC PANEL
Anion gap: 8 (ref 5–15)
BUN: 23 mg/dL (ref 8–23)
CO2: 23 mmol/L (ref 22–32)
Calcium: 8.4 mg/dL — ABNORMAL LOW (ref 8.9–10.3)
Chloride: 102 mmol/L (ref 98–111)
Creatinine, Ser: 1.22 mg/dL (ref 0.61–1.24)
GFR, Estimated: 56 mL/min — ABNORMAL LOW (ref >60)
Glucose, Bld: 121 mg/dL — ABNORMAL HIGH (ref 70–99)
Potassium: 4.6 mmol/L (ref 3.5–5.1)
Sodium: 133 mmol/L — ABNORMAL LOW (ref 135–145)

## 2020-06-19 LAB — ECHOCARDIOGRAM COMPLETE
AR max vel: 2.11 cm2
AV Area VTI: 2.24 cm2
AV Area mean vel: 1.97 cm2
AV Mean grad: 8 mmHg
AV Peak grad: 12.3 mmHg
Ao pk vel: 1.75 m/s
Area-P 1/2: 2.36 cm2
Height: 67 in
MV VTI: 1.73 cm2
Radius: 0.3 cm
S' Lateral: 2.4 cm
Weight: 2465.62 oz

## 2020-06-19 LAB — RESP PANEL BY RT-PCR (FLU A&B, COVID) ARPGX2
Influenza A by PCR: NEGATIVE
Influenza B by PCR: NEGATIVE
SARS Coronavirus 2 by RT PCR: NEGATIVE

## 2020-06-19 MED ORDER — PANTOPRAZOLE SODIUM 40 MG PO TBEC
40.0000 mg | DELAYED_RELEASE_TABLET | Freq: Every day | ORAL | 3 refills | Status: DC
  Filled 2020-06-19: qty 30, 30d supply, fill #0

## 2020-06-19 MED ORDER — CLOPIDOGREL BISULFATE 75 MG PO TABS
75.0000 mg | ORAL_TABLET | Freq: Every day | ORAL | 0 refills | Status: DC
Start: 2020-06-20 — End: 2020-08-13
  Filled 2020-06-19: qty 30, 30d supply, fill #0

## 2020-06-19 NOTE — Progress Notes (Signed)
  Echocardiogram 2D Echocardiogram has been performed.  Adrian [name] F 06/19/2020, 8:35 AM

## 2020-06-19 NOTE — Progress Notes (Signed)
CARDIAC REHAB PHASE I   PRE:  Rate/Rhythm: 80 afib    BP: sitting 98/70    SaO2: 91 RA, 97 RA  MODE:  Ambulation: 100 ft   POST:  Rate/Rhythm: 120 afib    BP: sitting 137/78     SaO2: 90 RA, 97 RA  Pt with min assist to stand and position himself. Tendency to have posterior lean. Pt weak but son will stay with him tonight. 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 He also works with Orthoptist at his ALF. Slow pace, HR increased to 120 afib. Veered right at times. Used rollator and gait belt. Some SOB, esp after donning mask. Sts his breathing is better. To recliner. Encouraged continuing to walk at his ALF. No elliptical for 1 week. [id number]   Harriet Masson CES, ACSM 06/19/2020 10:04 AM

## 2020-06-19 NOTE — Evaluation (Signed)
Physical Therapy Evaluation Patient Details Name: Adrian [name] MRN: [id number] DOB: 28-Nov-1927 Today's Date: 06/19/2020   History of Present Illness  Pt is a 87 y/o male s/p mitral valve repair on 4/7. PMH includes dCHF and a fib.  Clinical Impression  Pt is s/p surgery above with deficits below. Overall tolerated mobility well. Requiring min guard to supervision for safety. No overt LOB noted. Pt reports he has worked with PT at ALF, continued PT follow up upon d/c to ALF. Will continue to follow acutely.     Follow Up Recommendations Home health PT (return to ALF with HHPT)    Equipment Recommendations  None recommended by PT    Recommendations for Other Services       Precautions / Restrictions Precautions Precautions: Fall Restrictions Weight Bearing Restrictions: No      Mobility  Bed Mobility               General bed mobility comments: In chair upon entry    Transfers Overall transfer level: Needs assistance Equipment used: 4-wheeled walker Transfers: Sit to/from Stand Sit to Stand: Min guard         General transfer comment: Min guard for safety. Required use of momentum to stand.  Ambulation/Gait Ambulation/Gait assistance: Supervision;Min guard Gait Distance (Feet): 115 Feet Assistive device: Rolling walker (2 wheeled) Gait Pattern/deviations: Step-through pattern;Decreased stride length Gait velocity: Decreased   General Gait Details: Guarded gait, but overall steady. No overt LOB noted during mobility tasks.  Stairs            Wheelchair Mobility    Modified Rankin (Stroke Patients Only)       Balance Overall balance assessment: Mild deficits observed, not formally tested                                           Pertinent Vitals/Pain Pain Assessment: 0-10 Pain Score: 3  Pain Location: headache Pain Descriptors / Indicators: Headache Pain Intervention(s): Limited activity within patient's  tolerance;Monitored during session;Repositioned    Home Living Family/patient expects to be discharged to:: Assisted living               Home Equipment: Walker - 4 wheels;Grab bars - tub/shower;Shower seat - built in Additional Comments: [facility]    Prior Function Level of Independence: Needs assistance   Gait / Transfers Assistance Needed: Uses rollator for ambulation.  ADL's / Homemaking Assistance Needed: Required assist from staff for bathing/dressing.        Hand Dominance        Extremity/Trunk Assessment   Upper Extremity Assessment Upper Extremity Assessment: Generalized weakness    Lower Extremity Assessment Lower Extremity Assessment: Generalized weakness    Cervical / Trunk Assessment Cervical / Trunk Assessment: Kyphotic  Communication   Communication: HOH  Cognition Arousal/Alertness: Awake/alert Behavior During Therapy: WFL for tasks assessed/performed Overall Cognitive Status: Within Functional Limits for tasks assessed                                        General Comments General comments (skin integrity, edema, etc.): Pt's son present during session    Exercises     Assessment/Plan    PT Assessment Patient needs continued PT services  PT Problem List Decreased strength;Decreased balance;Decreased activity tolerance;Decreased mobility  PT Treatment Interventions DME instruction;Gait training;Functional mobility training;Therapeutic activities;Therapeutic exercise;Balance training;Patient/family education    PT Goals (Current goals can be found in the Care Plan section)  Acute Rehab PT Goals Patient Stated Goal: to go home PT Goal Formulation: With patient Time For Goal Achievement: 07/03/20 Potential to Achieve Goals: Good    Frequency Min 3X/week   Barriers to discharge        Co-evaluation               AM-PAC PT "6 Clicks" Mobility  Outcome Measure Help needed turning from your back  to your side while in a flat bed without using bedrails?: A Little Help needed moving from lying on your back to sitting on the side of a flat bed without using bedrails?: A Little Help needed moving to and from a bed to a chair (including a wheelchair)?: A Little Help needed standing up from a chair using your arms (e.g., wheelchair or bedside chair)?: A Little Help needed to walk in hospital room?: A Little Help needed climbing 3-5 steps with a railing? : A Little 6 Click Score: 18    End of Session Equipment Utilized During Treatment: Gait belt Activity Tolerance: Patient tolerated treatment well Patient left: in chair;with call bell/phone within reach;with family/visitor present Nurse Communication: Mobility status PT Visit Diagnosis: Muscle weakness (generalized) (M62.81)    Time: 6415-8309 PT Time Calculation (min) (ACUTE ONLY): 18 min   Charges:   PT Evaluation $PT Eval Low Complexity: 1 Low          Cindee Salt, DPT  Acute Rehabilitation Services  Pager: [phone] Office: [phone]   Adrian [name] 06/19/2020, 1:16 PM

## 2020-06-19 NOTE — Progress Notes (Signed)
Progress Note  Patient Name: Adrian [name] Date of Encounter: 06/19/2020  [facility] Cardiologist: Reatha Harps, MD   Subjective   No chest pain or shortness of breath.  Noted to have some shallow breathing with mild hypoxemia yesterday evening and was placed on supplemental O2.  This morning his oxygen saturation is 98% off of oxygen.  Feeling relatively well.  Did not sleep well overnight.  Son is at the bedside.  Inpatient Medications    Scheduled Meds: . aspirin EC  81 mg Oral Daily  . clopidogrel  75 mg Oral Q breakfast  . fluticasone  1 spray Each Nare Daily  . furosemide  40 mg Oral Daily  . guaiFENesin  600 mg Oral BID  . heparin injection (subcutaneous)  5,000 Units Subcutaneous Q8H  . montelukast  10 mg Oral QHS  . multivitamin with minerals  1 tablet Oral Daily  . pantoprazole  40 mg Oral Daily  . rivastigmine  3 mg Oral 2 times per day  . sertraline  25 mg Oral QHS  . sodium chloride flush  3 mL Intravenous Q12H   Continuous Infusions: . sodium chloride     PRN Meds: sodium chloride, acetaminophen, hydrALAZINE, labetalol, nitroGLYCERIN, ondansetron (ZOFRAN) IV, sodium chloride flush   Vital Signs    Vitals:   06/18/20 2231 06/18/20 2300 06/18/20 2317 06/19/20 0327  BP:   104/79 106/73  Pulse: 89 88 99 82  Resp: 18 (!) 26 20 20   Temp:   98.1 F (36.7 C) 98.1 F (36.7 C)  TempSrc:   Oral Oral  SpO2: 93% 94% 94% 97%  Weight:      Height:        Intake/Output Summary (Last 24 hours) at 06/19/2020 0723 Last data filed at 06/19/2020 0331 Gross per 24 hour  Intake 1350 ml  Output 1260 ml  Net 90 ml   Last 3 Weights 06/18/2020 06/16/2020 06/03/2020  Weight (lbs) 154 lb 1.6 oz 154 lb 1 oz 150 lb  Weight (kg) 69.9 kg 69.882 kg 68.04 kg      Telemetry    Atrial fibrillation with controlled ventricular response- Personally Reviewed   Physical Exam  Elderly male, no distress GEN: No acute distress.   Neck: No JVD Cardiac:  Irregular, 2/6  holosystolic murmur at the apex, much quieter than previous. Respiratory: Clear to auscultation bilaterally. GI: Soft, nontender, non-distended  MS: No edema; No deformity. Neuro:  Nonfocal  Psych: Normal affect   Labs    High Sensitivity Troponin:   Recent Labs  Lab 05/21/20 1600 05/21/20 1825  TROPONINIHS 18* 17      Chemistry Recent Labs  Lab 06/16/20 1028 06/19/20 0042  NA 132* 133*  K 4.6 4.6  CL 105 102  CO2 20* 23  GLUCOSE 96 121*  BUN 21 23  CREATININE 1.34* 1.22  CALCIUM 8.8* 8.4*  PROT 6.5  --   ALBUMIN 3.6  --   AST 25  --   ALT 22  --   ALKPHOS 116  --   BILITOT 0.9  --   GFRNONAA 50* 56*  ANIONGAP 7 8     Hematology Recent Labs  Lab 06/16/20 1028 06/19/20 0042  WBC 7.7 8.4  RBC 3.98* 3.57*  HGB 12.0* 10.5*  HCT 37.6* 32.6*  MCV 94.5 91.3  MCH 30.2 29.4  MCHC 31.9 32.2  RDW 13.9 13.7  PLT 287 222    BNPNo results for input(s): BNP, PROBNP in the last 168 hours.  DDimer No results for input(s): DDIMER in the last 168 hours.   Radiology    CARDIAC CATHETERIZATION  Result Date: 06/18/2020 Successful transcatheter edge-to-edge mitral valve repair using a single MitraClip G4 XTW device positioned on the medial aspect of A2/P2 with reduction in mitral regurgitation from 3+ at baseline to 2+ post procedure. There is significant V wave reduction following MitraClip deployment.  Pulmonary vein flow was initially blunted and is forward flow post clip deployment.  ECHO TEE  Result Date: 06/18/2020    TRANSESOPHOGEAL ECHO REPORT   Patient Name:   Adrian [name] Date of Exam: 06/18/2020 Medical Rec #:  [id number]      Height:       67.0 in Accession #:    [id number]     Weight:       154.1 lb Date of Birth:  06-02-1927      BSA:          1.810 m Patient Age:    87 years       BP:           135/76 mmHg Patient Gender: M              HR:           86 bpm. Exam Location:  Inpatient Procedure: Transesophageal Echo, Color Doppler, Cardiac Doppler and 3D Echo  Indications:    Severe mitral regurgitation [[id number]][[id number]]  History:        Patient has prior history of Echocardiogram examinations, most                 recent 05/22/2020. Arrythmias:Atrial Flutter; Risk Factors:Sleep                 Apnea. GERD.                  Mitral Valve: NTW Mitral Clip valve is present in the mitral                 position. Procedure Date: 06/18/2020.  Sonographer:    Leta Junglingiffany Ajla Mcgeachy RDCS Referring Phys: [phone] Amere Iott PROCEDURE: After discussion of the risks and benefits of a TEE, an informed consent was obtained from the patient. The patient was intubated. The transesophogeal probe was passed without difficulty through the esophogus of the patient. Imaged were obtained with the patient in a supine position. Sedation performed by different physician. The patient was monitored while under deep sedation. Anesthestetic sedation was provided intravenously by Anesthesiology: 90mg  of Propofol. The patient developed no complications during the procedure. PRE-PROCEDURE EXAM Normal left ventricular systolic function, LVEF 70%. 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 Moderate to severe mitral insufficiency, with the largest jet located at the medial side of P2 (towards P3) with an eccentric, medially directed jet. Myxomatous tricuspid valve with mild-moderate regurgitation. No left atrial thrombus. No pericardial effusion. POST-PROCEDURE EXAM Noral left ventricular systolic function, LVEF 65%. Well-deployed XTW MitraClip with stable position and good tissue bridge. There is eccentric, mild-to-moderate residual mitral insufficiency medial to the clip. There is trivial-to-mild residual mitral insufficiency lateral to the clip. Mean mitral valve diastolic gradient is 2 mm Hg. PHT 107 ms. By summation of the larger lateral and smaller medial mitral neo-orifices the mitral valve area is approximately 1.9 cm sq by  planimetry. There is a small (2.5 mm) iatrogenic atrial septal defect in the mid-fossa ovale with exclusively left-to-right shunt. No pericardial effusion.  IMPRESSIONS  1. Left ventricular ejection fraction, by estimation, is 65 to 70%. The left ventricle has normal function. The left ventricle has no regional wall motion abnormalities. Left ventricular diastolic function could not be evaluated.  2. Right ventricular systolic function is normal. The right ventricular size is normal. There is normal pulmonary artery systolic pressure.  3. Left atrial size was mildly dilated. No left atrial/left atrial appendage thrombus was detected.  4. The mitral valve is myxomatous. Moderate to severe mitral valve regurgitation. No evidence of mitral stenosis. There is severe holosystolic prolapse of the middle scallop of the posterior leaflet of the mitral valve. The mean mitral valve gradient is  1.6 mmHg. There is a NTW Mitral Clip present in the mitral position. Procedure Date: 06/18/2020.  5. The tricuspid valve is myxomatous. Tricuspid valve regurgitation is mild to moderate.  6. The aortic valve is tricuspid. Aortic valve regurgitation is mild. Mild to moderate aortic valve sclerosis/calcification is present, without any evidence of aortic stenosis.  7. There is Moderate (Grade III) layered plaque involving the descending aorta. FINDINGS  Left Ventricle: Left ventricular ejection fraction, by estimation, is 65 to 70%. The left ventricle has normal function. The left ventricle has no regional wall motion abnormalities. The left ventricular internal cavity size was normal in size. There is  no left ventricular hypertrophy. Left ventricular diastolic function could not be evaluated due to atrial fibrillation. Left ventricular diastolic function could not be evaluated. Right Ventricle: The right ventricular size is normal. No increase in right ventricular wall thickness. Right ventricular systolic function is normal. There is  normal pulmonary artery systolic pressure. The tricuspid regurgitant velocity is 1.95 m/s, and  with an assumed right atrial pressure of 5 mmHg, the estimated right ventricular systolic pressure is 20.2 mmHg. Left Atrium: Left atrial size was mildly dilated. No left atrial/left atrial appendage thrombus was detected. Right Atrium: Right atrial size was normal in size. Pericardium: There is no evidence of pericardial effusion. Mitral Valve: The mitral valve is myxomatous. There is severe holosystolic prolapse of the middle scallop of the posterior leaflet of the mitral valve. There is moderate thickening of the anterior and posterior mitral valve leaflet(s). Moderate to severe  mitral valve regurgitation. There is a NTW Mitral Clip present in the mitral position. Procedure Date: 06/18/2020. No evidence of mitral valve stenosis. The mean mitral valve gradient is 1.6 mmHg with average heart rate of 67 bpm. Tricuspid Valve: The tricuspid valve is myxomatous. Tricuspid valve regurgitation is mild to moderate. Aortic Valve: The aortic valve is tricuspid. Aortic valve regurgitation is mild. Aortic regurgitation PHT measures 497 msec. Mild to moderate aortic valve sclerosis/calcification is present, without any evidence of aortic stenosis. Pulmonic Valve: The pulmonic valve was normal in structure. Pulmonic valve regurgitation is trivial. Aorta: The aortic root, ascending aorta and aortic arch are all structurally normal, with no evidence of dilitation or obstruction. There is moderate (Grade III) layered plaque involving the descending aorta. IAS/Shunts: No atrial level shunt detected by color flow Doppler.  LEFT VENTRICLE PLAX 2D LVOT diam:     2.40 cm LVOT Area:     4.52 cm  AORTIC VALVE AI PHT:      497 msec MITRAL VALVE            TRICUSPID VALVE MV Area (PHT): 2.10 cm TR Peak grad:   15.2 mmHg MV Mean grad:  1.6 mmHg TR Vmax:        195.00 cm/s MV Decel Time: 361 msec  SHUNTS                          Systemic Diam: 2.40 cm Thurmon Fair MD Electronically signed by Thurmon Fair MD Signature Date/Time: 06/18/2020/1:08:12 PM    Final     Cardiac Studies   Postoperative day #1 echo pending  Patient Profile     87 y.o. male with recent hospital admission for acute diastolic heart failure found to have severe mitral regurgitation.  The patient underwent multidisciplinary heart team evaluation and was referred for transcatheter edge-to-edge mitral valve repair (MitraClip) on 06/18/2020  Assessment & Plan    1.  Severe nonrheumatic mitral regurgitation: Patient status post transcatheter edge-to-edge mitral valve repair with MitraClip with reduction in mitral regurgitation.  Aspirin and clopidogrel x3 months then continued aspirin long-term.  Postoperative day #1 echo interpretation is pending.  However, I have reviewed the echo images.  There is no pericardial effusion present.  The MitraClip is in appropriate position.  There is mild to moderate residual MR noted. 2.  Chronic diastolic heart failure, New York Heart Association functional class II: Continue furosemide 40 mg daily. 3.  Longstanding persistent atrial fibrillation: Not on anticoagulation because of history of intracranial hemorrhage.  Disposition: Medically stable for discharge.  Plans as outlined above.  Follow-up arranged in structural clinic.  Longitudinal care by Dr. Flora Lipps.  For questions or updates, please contact [facility] Please consult [email or web address] for contact info under        Signed, Tonny Bollman, MD  06/19/2020, 7:23 AM

## 2020-06-19 NOTE — Progress Notes (Signed)
OT Cancellation Note  Patient Details Name: Adrian [name] MRN: [id number] DOB: August 29, 1927   Cancelled Treatment:    Reason Eval/Treat Not Completed: OT screened, no needs identified, will sign off. Spoke with PT who states pt is at his baseline.   Thornell Mule, OT/L   Acute OT Clinical Specialist Acute Rehabilitation Services Pager [phone] Office [phone]  06/19/2020, 1:38 PM

## 2020-06-19 NOTE — TOC Initial Note (Addendum)
Transition of Care [facility]) - Initial/Assessment Note    Patient Details  Name: Adrian [name] MRN: [id number] Date of Birth: [birth year]  Transition of Care [facility]) CM/SW Contact:    Lynett Grimes Phone Number: 06/19/2020, 1:37 PM  Clinical Narrative:                 1:37pm- pt seen patient and recommenced HH, pt is ready for dc and is awaiting covid results. CSW contacted facility to make sure pt could return. No one in admissions was available CSW left a message with secretary and  Faxed over FL2 and DC Summary. CSW will follow up.  Expected Discharge Plan: Assisted Living Barriers to Discharge: Barriers Resolved   Patient Goals and CMS Choice   CMS Medicare.gov Compare Post Acute Care list provided to:: Other (Comment Required) Choice offered to / list presented to : NA  Expected Discharge Plan and Services Expected Discharge Plan: Assisted Living In-house Referral: Clinical Social Work Discharge Planning Services: Other - See comment (CSW) Post Acute Care Choice: Home Health Living arrangements for the past 2 months: Assisted Living Facility Expected Discharge Date: 06/19/20                                    Prior Living Arrangements/Services Living arrangements for the past 2 months: Assisted Living Facility Lives with:: Facility Resident                   Activities of Daily Living      Permission Sought/Granted                  Emotional Assessment Appearance:: Appears stated age Attitude/Demeanor/Rapport: Unable to Assess Affect (typically observed): Unable to Assess Orientation: : Oriented to Place,Oriented to Self,Oriented to  Time,Oriented to Situation Alcohol / Substance Use: Not Applicable Psych Involvement: No (comment)  Admission diagnosis:  Non-rheumatic mitral regurgitation [I34.0] Patient Active Problem List   Diagnosis Date Noted  . Non-rheumatic mitral regurgitation 06/18/2020  . S/P mitral valve clip implantation  06/18/2020  . History of intracranial hemorrhage   . OSA (obstructive sleep apnea)   . GERD (gastroesophageal reflux disease)   . Chronic diastolic heart failure (HCC) 05/24/2020  . Persistent atrial fibrillation (HCC) 05/24/2020   PCP:  [employer]:   [facility] - [place], [place] - [address] AT Portal to Registered [facility] Sites [address] [place] [address] Phone: [phone] Fax: [phone]  [id number] [place],  - [address] AT [facility] [address] & [facility] [address] [place] [address] Phone: [phone] Fax: [phone]  [facility] [address] [place] [address] Phone: [phone] Fax: [phone]     Social Determinants of Health (SDOH) Interventions    Readmission Risk Interventions No flowsheet data found.

## 2020-06-19 NOTE — TOC Transition Note (Signed)
Transition of Care [facility]) - CM/SW Discharge Note   Patient Details  Name: Adrian [name] MRN: [id number] Date of Birth: Nov 20, 1927  [facility]) CM/SW Contact:  Lynett Grimes Phone Number: 06/19/2020, 1:24 PM   Clinical Narrative:    Patient will DC to: [facility] Anticipated DC date: 06/19/2020 Family notified: Son  Transport by: Son   Per MD patient ready for DC to [facility] . RN to call report prior to discharge [phone]). RN, patient, patient's family, and facility notified of DC. Discharge Summary and FL2 sent to facility. DC packet on chart.    CSW will sign off for now as social work intervention is no longer needed. Please consult [place] again if new needs arise.            Patient Goals and CMS Choice        Discharge Placement                       Discharge Plan and Services                                     Social Determinants of Health (SDOH) Interventions     Readmission Risk Interventions No flowsheet data found.

## 2020-06-19 NOTE — Discharge Summary (Signed)
Discharge Summary    Patient ID: Adrian [name] MRN: [id number]; DOB: 1928/02/25  Admit date: 06/18/2020 Discharge date: 06/19/2020  PCP:  Almetta Lovely, [facility]   [facility]  Cardiologist:  Reatha Harps, MD  Electrophysiologist:  None  [facility]:  Tonny Bollman, MD    {  Discharge Diagnoses    Principal Problem:   S/P mitral valve clip implantation Active Problems:   Chronic diastolic heart failure (HCC)   Persistent atrial fibrillation (HCC)   Non-rheumatic mitral regurgitation   History of intracranial hemorrhage   OSA (obstructive sleep apnea)   GERD (gastroesophageal reflux disease)    Diagnostic Studies/Procedures    Mitral Valve Repair 06/18/2020: Successful transcatheter edge-to-edge mitral valve repair using a single MitraClip G4 XTW device positioned on the medial aspect of A2/P2 with reduction in mitral regurgitation from 3+ at baseline to 2+ post procedure.  There is significant V wave reduction following MitraClip deployment.  Pulmonary vein flow was initially blunted and is forward flow post clip deployment. _____________  TEE 06/18/2020: Pre-Procedure Exam: Normal left ventricular systolic function, LVEF 70%.  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 Moderate to severe mitral insufficiency, with the largest jet located at the medial side of P2 (towards P3) with an  eccentric, medially directed jet. Myxomatous tricuspid valve with mild-moderate regurgitation. No left atrial thrombus. No pericardial effusion.   Post-Procedure Exam: Noral left ventricular systolic function, LVEF 65%. Well-deployed XTW MitraClip with stable position and good tissue bridge.  There is eccentric, mild-to-moderate residual mitral insufficiency medial to the clip. There is trivial-to-mild residual mitral insufficiency  lateral to the clip. Mean mitral  valve diastolic gradient is 2 mm Hg. PHT 107 ms. By summation of the larger lateral and smaller medial mitral neo-orifices the mitral valve area is approximately 1.9 cm sq by planimetry. There is a small (2.5 mm) iatrogenic atrial septal defect in the mid-fossa ovale with exclusively left-to-right shunt.  No pericardial effusion.  _______________  Post-Op Day 1 Echo 06/19/2020: Formal read pending but Dr. Excell Seltzer reviewed images and reported: There is no pericardial effusion present.  The MitraClip is in appropriate position.  There is mild to moderate residual MR noted.    History of Present Illness     Mr. Adrian [name] is a 87 year male with a history of severe mitral regurgitation, persistent atrial fibrillation not on anticoagulation due to history of intracranial hemorrhage while on Coumadin, obstructive sleep apnea on CPAP, and GERD.   Patient recently had fairly acute onset of dyspnea as well as somnolence, weakness, and fatigue about 2 months ago. Echo showed newly diagnosed severe MR and chest x-ray demonstrated bilateral pulmonary infiltrates suggestive of pneumonia which in retrospect were likely related to acute CHF. He was hospitalized in 05/2020 and diuresed. TEE during admission showed prolapse of the posterior leaflet of the mitral valve with suspected partial flail segment. He became severely hypotensive during TEE and the degree of MR may have been underestimated because of hemodynamic state. R/LHC showed mild disease in the mid LAD with severe 80% stenosis of distal LAD just before the vessel wraps around the apex. Medical therapy was recommendation for CAD. Patient was referred to Structural Heart Team and seen by Dr. Excell Seltzer as outpatient on 05/26/2020. At that time, he reported continued shortness of breath with low levels of activity since discharge but denied any orthopnea or PND. Decision was made to  proceed with MitraClip.   Hospital Course     Consultants: None  Patient  presented on 06/18/2020 for MitraClip and underwent successful transcatheter edge-to-edge mitral valve repair using a single MitraClip G4 XTW device positioned on the medial aspect of A2/P2 with reduction in mitral regurgitation from 3+ at baseline to 2+ post procedure. There was significant V wave reduction following MitraClip deployment. Pulmonary vein flow was initially blunted but was forward flow post clip deployment. Patient tolerated the procedure well. Post-op day 1 Echo interpretation pending but Dr. Excell Seltzer reviewed images and reports MitraClip is in appropriate position with mild to moderate residual MR noted. No pericardial effusion present. Plan will be for DAPT with Aspirin and Plavix for 3 months and then Aspirin alone long-term. Will stop home Nexium and start Protonix instead now that he is on Plavix. Continue all other home medications.  Patient seen by Dr. Excell Seltzer today and determined to be stable for discharge. Outpatient follow-up has been arranged. 1 month post-op Echo has also been arranged.  Did the patient have an acute coronary syndrome (MI, NSTEMI, STEMI, etc) this admission?:  No                               Did the patient have a percutaneous coronary intervention (stent / angioplasty)?:  No.      _____________  Discharge Vitals Blood pressure 98/70, pulse 78, temperature 98.2 F (36.8 C), temperature source Oral, resp. rate (!) 22, height  (1.702 m), weight 69.9 kg, SpO2 92 %.  Filed Weights   06/18/20 0548  Weight: 69.9 kg    Labs & Radiologic Studies    CBC Recent Labs    06/16/20 1028 06/19/20 0042  WBC 7.7 8.4  HGB 12.0* 10.5*  HCT 37.6* 32.6*  MCV 94.5 91.3  PLT 287 222   Basic Metabolic Panel Recent Labs    16/10/96 1028 06/19/20 0042  NA 132* 133*  K 4.6 4.6  CL 105 102  CO2 20* 23  GLUCOSE 96 121*  BUN 21 23  CREATININE 1.34* 1.22  CALCIUM 8.8* 8.4*   Liver Function Tests Recent Labs    06/16/20 1028  AST 25  ALT 22  ALKPHOS  116  BILITOT 0.9  PROT 6.5  ALBUMIN 3.6   No results for input(s): LIPASE, AMYLASE in the last 72 hours. High Sensitivity Troponin:   Recent Labs  Lab 05/21/20 1600 05/21/20 1825  TROPONINIHS 18* 17    BNP Invalid input(s): POCBNP D-Dimer No results for input(s): DDIMER in the last 72 hours. Hemoglobin A1C No results for input(s): HGBA1C in the last 72 hours. Fasting Lipid Panel No results for input(s): CHOL, HDL, LDLCALC, TRIG, CHOLHDL, LDLDIRECT in the last 72 hours. Thyroid Function Tests No results for input(s): TSH, T4TOTAL, T3FREE, THYROIDAB in the last 72 hours.  Invalid input(s): FREET3 _____________  DG Chest 2 View  Result Date: 06/16/2020 CLINICAL DATA:  Preoperative assessment for mitral valve repair EXAM: CHEST - 2 VIEW COMPARISON:  May 21, 2020 FINDINGS: There is mild generalized interstitial thickening. There is no edema or airspace opacity. Heart is mildly enlarged with pulmonary vascularity normal. There is aortic atherosclerosis. No adenopathy. There is a focal hiatal type hernia. No bone lesions. Postoperative change noted in the right shoulder. IMPRESSION: Interstitial thickening likely reflects a degree of underlying chronic bronchitis. No edema or airspace opacity. Mild cardiac enlargement. There is a hiatal type hernia. Aortic Atherosclerosis (ICD10-I70.0).  Electronically Signed   By: Bretta Bang III M.D.   On: 06/16/2020 11:34   DG Chest 2 View  Result Date: 05/21/2020 CLINICAL DATA:  Shortness of breath and fatigue EXAM: CHEST - 2 VIEW COMPARISON:  None. FINDINGS: There is airspace opacity in the anterior segment right upper lobe consistent with pneumonia. There is consolidation in the posteromedial left base consistent with a second focus of pneumonia. The lungs elsewhere are clear. Heart is upper normal in size with pulmonary vascularity within normal limits. No adenopathy. There is aortic atherosclerosis. Postoperative change noted in the right  shoulder. There is degenerative change in each shoulder with superior migration of each humeral head. IMPRESSION: Areas of apparent pneumonia in the anterior segment right upper lobe and in the posteromedial left base region. Heart size within normal limits. No adenopathy. Aortic Atherosclerosis (ICD10-I70.0). Suspect chronic rotator cuff tears bilaterally. These results will be called to the ordering clinician or representative by the Radiologist Assistant, and communication documented in the PACS or [employer]. Electronically Signed   By: Bretta Bang III M.D.   On: 05/21/2020 11:17   CARDIAC CATHETERIZATION  Result Date: 06/18/2020 Successful transcatheter edge-to-edge mitral valve repair using a single MitraClip G4 XTW device positioned on the medial aspect of A2/P2 with reduction in mitral regurgitation from 3+ at baseline to 2+ post procedure. There is significant V wave reduction following MitraClip deployment.  Pulmonary vein flow was initially blunted and is forward flow post clip deployment.  CARDIAC CATHETERIZATION  Result Date: 06/03/2020  Dist LAD lesion is 80% stenosed.  Mid LAD-1 lesion is 30% stenosed.  Mid LAD-2 lesion is 30% stenosed.  1. Mild disease in the mid LAD. The distal LAD has a severe lesion just before the vessel wraps around the apex. 2. No obstructive disease in the dominant Circumflex and RCA   ECHOCARDIOGRAM COMPLETE  Result Date: 05/21/2020    ECHOCARDIOGRAM REPORT   Patient Name:   TAYLOR LEVICK Date of Exam: 05/21/2020 Medical Rec #:  [id number]      Height:       64.0 in Accession #:    [id number]     Weight:       162.2 lb Date of Birth:  Dec 31, 1927      BSA:          1.790 m Patient Age:    89 years       BP:           132/74 mmHg Patient Gender: M              HR:           73 bpm. Exam Location:  [place] Procedure: 2D Echo, Cardiac Doppler and Color Doppler Indications:    R06.02 SOB; R06.9 DOE; R53.83 Fatigue  History:        Patient has no prior history  of Echocardiogram examinations and                 Patient has prior history of Echocardiogram examinations, most                 recent 11/19/2019. History of intracranial hemorrhage.,                 Arrythmias:Atrial Fibrillation, Signs/Symptoms:Shortness of                 Breath and Dyspnea; Risk Factors:Hypertension. 04/22/20 ER  visit, he was found to be in A-fib with RVR and hypotensive. It                 was felt that he was dehydrated.                 SOB has been steadily worsening.  Sonographer:    Vella Kohler BS, RVT, RDCS Referring Phys: [phone] Ronnald Ramp O'NEAL IMPRESSIONS  1. Left ventricular ejection fraction, by estimation, is 55 to 60%. The left ventricle has normal function. The left ventricle has no regional wall motion abnormalities. There is moderate left ventricular hypertrophy. Left ventricular diastolic parameters are indeterminate.  2. Right ventricular systolic function is mildly reduced. The right ventricular size is moderately enlarged.  3. Left atrial size was mildly dilated.  4. Right atrial size was mildly dilated.  5. Moderate pleural effusion in the left lateral region.  6. 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 The eccentricity of the jet may lead to underestimation of MR. The MV/AV VTI ratio is around 1 suggesting moderate MR. . The mitral valve is abnormal. Moderate mitral valve regurgitation. No evidence of mitral stenosis.  7. Tricuspid valve regurgitation is mild to moderate.  8. The aortic valve is tricuspid. There is moderate calcification of the aortic valve. There is moderate thickening of the aortic valve. Aortic valve regurgitation is mild.  9. Moderate pulmonary HTN, PASP is 57 mmHg. 10. The inferior vena cava is dilated in size with <50% respiratory variability, suggesting right atrial pressure of 15 mmHg. Comparison(s): Prior Echo at [facility] showed LV EF 60-65%, no RWMA, mild LVH, mild LAE, tricuspid AoV  with mild AI, mild MR. FINDINGS  Left Ventricle: LVOT VTI appears underestimated. Left ventricular ejection fraction, by estimation, is 55 to 60%. The left ventricle has normal function. The left ventricle has no regional wall motion abnormalities. Global longitudinal strain performed but not reported based on interpreter judgement due to suboptimal tracking. The left ventricular internal cavity size was normal in size. There is moderate left ventricular hypertrophy. Left ventricular diastolic parameters are indeterminate. Right Ventricle: The right ventricular size is moderately enlarged. No increase in right ventricular wall thickness. Right ventricular systolic function is mildly reduced. Left Atrium: Left atrial size was mildly dilated. Right Atrium: Right atrial size was mildly dilated. Pericardium: There is no evidence of pericardial effusion. 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 The eccentricity of the jet may lead to underestimation of MR. The MV/AV VTI ratio is around 1 suggesting moderate MR. The mitral valve is abnormal. Moderate  mitral valve regurgitation. No evidence of mitral valve stenosis. Tricuspid Valve: The tricuspid valve is normal in structure. Tricuspid valve regurgitation is mild to moderate. No evidence of tricuspid stenosis. Aortic Valve: The aortic valve is tricuspid. There is moderate calcification of the aortic valve. There is moderate thickening of the aortic valve. There is moderate aortic valve annular calcification. Aortic valve regurgitation is mild. Aortic regurgitation PHT measures 688 msec. Aortic valve mean gradient measures 5.7 mmHg. Aortic valve peak gradient measures 13.0 mmHg. Aortic valve area, by VTI measures 1.70 cm. Pulmonic Valve: The pulmonic valve was not well visualized. Pulmonic valve regurgitation is not visualized. No evidence of pulmonic stenosis. Aorta: The aortic root is normal in size and structure.  Pulmonary Artery: Moderate pulmonary HTN, PASP is 57 mmHg. Venous: The inferior vena cava is dilated in size with less than 50% respiratory variability,  suggesting right atrial pressure of 15 mmHg. IAS/Shunts: The interatrial septum was not well visualized. Additional Comments: There is a moderate pleural effusion in the left lateral region.  LEFT VENTRICLE PLAX 2D LVIDd:         3.49 cm     Diastology LVIDs:         2.56 cm     LV e' medial:    8.92 cm/s LV PW:         1.43 cm     LV E/e' medial:  19.2 LV IVS:        1.56 cm     LV e' lateral:   9.14 cm/s LVOT diam:     2.30 cm     LV E/e' lateral: 18.7 LV SV:         52 LV SV Index:   29 LVOT Area:     4.15 cm  LV Volumes (MOD) LV vol d, MOD A2C: 72.1 ml LV vol d, MOD A4C: 64.3 ml LV vol s, MOD A2C: 31.2 ml LV vol s, MOD A4C: 25.6 ml LV SV MOD A2C:     40.9 ml LV SV MOD A4C:     64.3 ml LV SV MOD BP:      43.4 ml RIGHT VENTRICLE RV Basal diam:  3.98 cm RV Mid diam:    2.62 cm RV S prime:     14.70 cm/s TAPSE (M-mode): 1.5 cm LEFT ATRIUM             Index       RIGHT ATRIUM           Index LA diam:        4.60 cm 2.57 cm/m  RA Area:     19.30 cm LA Vol (A2C):   63.6 ml 35.54 ml/m RA Volume:   51.80 ml  28.94 ml/m LA Vol (A4C):   51.5 ml 28.78 ml/m LA Biplane Vol: 58.1 ml 32.46 ml/m  AORTIC VALVE                    PULMONIC VALVE AV Area (Vmax):    1.86 cm     PV Vmax:       1.07 m/s AV Area (Vmean):   2.10 cm     PV Peak grad:  4.6 mmHg AV Area (VTI):     1.70 cm AV Vmax:           180.16 cm/s AV Vmean:          110.806 cm/s AV VTI:            0.308 m AV Peak Grad:      13.0 mmHg AV Mean Grad:      5.7 mmHg LVOT Vmax:         80.60 cm/s LVOT Vmean:        56.100 cm/s LVOT VTI:          0.126 m LVOT/AV VTI ratio: 0.41 AI PHT:            688 msec AR Vena Contracta: 0.40 cm  AORTA Ao Root diam: 3.70 cm Ao Asc diam:  3.40 cm MR Peak grad:    98.4 mmHg   TRICUSPID VALVE MR Mean grad:    66.0 mmHg   TR Peak grad:   42.5 mmHg MR Vmax:         496.00 cm/s TR  Vmax:        326.00 cm/s MR Vmean:  387.0 cm/s MR PISA:         1.57 cm    SHUNTS MR PISA Eff ROA: 7 mm       Systemic VTI:  0.13 m MR PISA Radius:  0.50 cm     Systemic Diam: 2.30 cm MV E velocity: 171.00 cm/s MV A velocity: 35.80 cm/s MV E/A ratio:  4.78 Dina RichJonathan Branch MD Electronically signed by Dina RichJonathan Branch MD Signature Date/Time: 05/21/2020/10:45:05 AM    Final    ECHO TEE  Result Date: 06/18/2020    TRANSESOPHOGEAL ECHO REPORT   Patient Name:   Dena BilletHAROLD Millett Date of Exam: 06/18/2020 Medical Rec #:  [id number]      Height:       67.0 in Accession #:    [id number]     Weight:       154.1 lb Date of Birth:  11-12-1927      BSA:          1.810 m Patient Age:    89 years       BP:           135/76 mmHg Patient Gender: M              HR:           86 bpm. Exam Location:  Inpatient Procedure: Transesophageal Echo, Color Doppler, Cardiac Doppler and 3D Echo Indications:    Severe mitral regurgitation [[id number]]  History:        Patient has prior history of Echocardiogram examinations, most                 recent 05/22/2020. Arrythmias:Atrial Flutter; Risk Factors:Sleep                 Apnea. GERD.                  Mitral Valve: NTW Mitral Clip valve is present in the mitral                 position. Procedure Date: 06/18/2020.  Sonographer:    Leta Junglingiffany Cooper RDCS Referring Phys: [phone] MICHAEL COOPER PROCEDURE: After discussion of the risks and benefits of a TEE, an informed consent was obtained from the patient. The patient was intubated. The transesophogeal probe was passed without difficulty through the esophogus of the patient. Imaged were obtained with the patient in a supine position. Sedation performed by different physician. The patient was monitored while under deep sedation. Anesthestetic sedation was provided intravenously by Anesthesiology: 90mg  of Propofol. The patient developed no complications during the procedure. PRE-PROCEDURE EXAM Normal left ventricular systolic function, LVEF 70%. Severe  myxomatous change of the mitral valve with severe holosystolic prolapse of the middle (P2) scallop of the posterior leaflet. Moderate to severe mitral insufficiency, with the largest jet located at the medial side of P2 (towards P3) with an eccentric, medially directed jet. Myxomatous tricuspid valve with mild-moderate regurgitation. No left atrial thrombus. No pericardial effusion. POST-PROCEDURE EXAM Noral left ventricular systolic function, LVEF 65%. Well-deployed XTW MitraClip with stable position and good tissue bridge. There is eccentric, mild-to-moderate residual mitral insufficiency medial to the clip. There is trivial-to-mild residual mitral insufficiency lateral to the clip. Mean mitral valve diastolic gradient is 2 mm Hg. PHT 107 ms. By summation of the larger lateral and smaller medial mitral neo-orifices the mitral valve area is approximately 1.9 cm sq by planimetry. There is a small (2.5 mm) iatrogenic atrial septal defect in the mid-fossa ovale with exclusively left-to-right shunt. No  pericardial effusion.  IMPRESSIONS  1. Left ventricular ejection fraction, by estimation, is 65 to 70%. The left ventricle has normal function. The left ventricle has no regional wall motion abnormalities. Left ventricular diastolic function could not be evaluated.  2. Right ventricular systolic function is normal. The right ventricular size is normal. There is normal pulmonary artery systolic pressure.  3. Left atrial size was mildly dilated. No left atrial/left atrial appendage thrombus was detected.  4. The mitral valve is myxomatous. Moderate to severe mitral valve regurgitation. No evidence of mitral stenosis. There is severe holosystolic prolapse of the middle scallop of the posterior leaflet of the mitral valve. The mean mitral valve gradient is  1.6 mmHg. There is a NTW Mitral Clip present in the mitral position. Procedure Date: 06/18/2020.  5. The tricuspid valve is myxomatous. Tricuspid valve regurgitation is  mild to moderate.  6. The aortic valve is tricuspid. Aortic valve regurgitation is mild. Mild to moderate aortic valve sclerosis/calcification is present, without any evidence of aortic stenosis.  7. There is Moderate (Grade III) layered plaque involving the descending aorta. FINDINGS  Left Ventricle: Left ventricular ejection fraction, by estimation, is 65 to 70%. The left ventricle has normal function. The left ventricle has no regional wall motion abnormalities. The left ventricular internal cavity size was normal in size. There is  no left ventricular hypertrophy. Left ventricular diastolic function could not be evaluated due to atrial fibrillation. Left ventricular diastolic function could not be evaluated. Right Ventricle: The right ventricular size is normal. No increase in right ventricular wall thickness. Right ventricular systolic function is normal. There is normal pulmonary artery systolic pressure. The tricuspid regurgitant velocity is 1.95 m/s, and  with an assumed right atrial pressure of 5 mmHg, the estimated right ventricular systolic pressure is 20.2 mmHg. Left Atrium: Left atrial size was mildly dilated. No left atrial/left atrial appendage thrombus was detected. Right Atrium: Right atrial size was normal in size. Pericardium: There is no evidence of pericardial effusion. Mitral Valve: The mitral valve is myxomatous. There is severe holosystolic prolapse of the middle scallop of the posterior leaflet of the mitral valve. There is moderate thickening of the anterior and posterior mitral valve leaflet(s). Moderate to severe  mitral valve regurgitation. There is a NTW Mitral Clip present in the mitral position. Procedure Date: 06/18/2020. No evidence of mitral valve stenosis. The mean mitral valve gradient is 1.6 mmHg with average heart rate of 67 bpm. Tricuspid Valve: The tricuspid valve is myxomatous. Tricuspid valve regurgitation is mild to moderate. Aortic Valve: The aortic valve is tricuspid.  Aortic valve regurgitation is mild. Aortic regurgitation PHT measures 497 msec. Mild to moderate aortic valve sclerosis/calcification is present, without any evidence of aortic stenosis. Pulmonic Valve: The pulmonic valve was normal in structure. Pulmonic valve regurgitation is trivial. Aorta: The aortic root, ascending aorta and aortic arch are all structurally normal, with no evidence of dilitation or obstruction. There is moderate (Grade III) layered plaque involving the descending aorta. IAS/Shunts: No atrial level shunt detected by color flow Doppler.  LEFT VENTRICLE PLAX 2D LVOT diam:     2.40 cm LVOT Area:     4.52 cm  AORTIC VALVE AI PHT:      497 msec MITRAL VALVE            TRICUSPID VALVE MV Area (PHT): 2.10 cm TR Peak grad:   15.2 mmHg MV Mean grad:  1.6 mmHg TR Vmax:        195.00 cm/s MV  Decel Time: 361 msec                         SHUNTS                         Systemic Diam: 2.40 cm Thurmon Fair MD Electronically signed by Thurmon Fair MD Signature Date/Time: 06/18/2020/1:08:12 PM    Final    ECHO TEE  Result Date: 05/22/2020    TRANSESOPHOGEAL ECHO REPORT   Patient Name:   COLLIS THEDE Date of Exam: 05/22/2020 Medical Rec #:  [id number]      Height:       67.0 in Accession #:    [id number]     Weight:       155.0 lb Date of Birth:  Nov 11, 1927      BSA:          1.815 m Patient Age:    89 years       BP:           159/102 mmHg Patient Gender: M              HR:           66 bpm. Exam Location:  Inpatient Procedure: Transesophageal Echo and 3D Echo Indications:     Mitral valve disease  History:         Patient has prior history of Echocardiogram examinations, most                  recent 05/21/2020. Risk Factors:Non-Smoker and Sleep Apnea.  Sonographer:     Renella Cunas RDCS Referring Phys:  [id number] BRIDGETTE CHRISTOPHER Diagnosing Phys: Jodelle Red MD PROCEDURE: The transesophogeal probe was passed without difficulty through the esophogus of the patient. Sedation performed by  different physician. The patient was monitored while under deep sedation. Anesthestetic sedation was provided intravenously by Anesthesiology: 294.  of Propofol. The patient developed no complications during the procedure. IMPRESSIONS  1. Left ventricular ejection fraction, by estimation, is 55 to 60%. The left ventricle has normal function.  2. Right ventricular systolic function is normal. The right ventricular size is normal.  3. No left atrial/left atrial appendage thrombus was detected.  4. There is severe prolapse of P2 leaflet. 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 The MR jet is very eccentric and difficult to quantitate, but favor severe regurgitation.. The mitral valve is abnormal. Moderate to severe mitral valve regurgitation. No evidence of mitral stenosis.  5. Tricuspid valve regurgitation is mild to moderate.  6. The aortic valve is tricuspid. Aortic valve regurgitation is mild. No aortic stenosis is present. Conclusion(s)/Recommendation(s): Findings and images discussed with Drs. Skains and O'Neal. 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 Very eccentric MR jet. Given anatomy of valve and appearance of jet, favor severe eccentric mitral regurgitation. Of note, he was hypotensive and required phenylephrine during anesthesia. This may also affect the appearance of mitral regurgitation, underestimating it on these images. FINDINGS  Left Ventricle: Left ventricular ejection fraction, by estimation, is 55 to 60%. The left ventricle has normal function. The left ventricular internal cavity size was normal in size. Right Ventricle: The right ventricular size is normal. No increase in right ventricular wall thickness. Right ventricular systolic function is normal. Left Atrium: Left atrial size was not assessed. No left atrial/left atrial appendage thrombus was detected. Right Atrium:  Right atrial size was not assessed.  Pericardium: Trivial pericardial effusion is present. Mitral Valve: There is severe prolapse of P2 leaflet. 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 The MR jet is very eccentric and difficult to quantitate, but favor severe regurgitation. The mitral valve is  abnormal. Moderate to severe mitral valve regurgitation. No evidence of mitral valve stenosis. Tricuspid Valve: The tricuspid valve is normal in structure. Tricuspid valve regurgitation is mild to moderate. Aortic Valve: The aortic valve is tricuspid. Aortic valve regurgitation is mild. No aortic stenosis is present. Pulmonic Valve: The pulmonic valve was normal in structure. Pulmonic valve regurgitation is mild. Aorta: The aortic root and ascending aorta are structurally normal, with no evidence of dilitation. IAS/Shunts: No atrial level shunt detected by color flow Doppler. Jodelle Red MD Electronically signed by Jodelle Red MD Signature Date/Time: 05/22/2020/3:47:24 PM    Final    Disposition   Patient is being discharged home today in good condition.  Follow-up Plans & Appointments     Follow-up Information    O'Neal, Ronnald Ramp, MD. Go on 06/26/2020.   Specialties: Internal Medicine, Cardiology, Radiology Why: @ 1:30pm, please arrive at least 10 minutes early.  Contact information: [address] [place] [address] [phone]                Discharge Medications   Allergies as of 06/19/2020      Reactions   Milk Protein Other (See Comments)   Causes runny nose and sneezing       Medication List    STOP taking these medications   esomeprazole 20 MG capsule Commonly known as: NEXIUM Replaced by: pantoprazole 40 MG tablet     TAKE these medications   acetaminophen 500 MG tablet Commonly known as: TYLENOL Take 500 mg by mouth in the morning, at noon, and at bedtime. (0900, 1300 & 2100)   aspirin 81 MG EC tablet Take 81 mg by mouth daily.  (0900)   BAZA PROTECT EX Apply 1 application topically in the morning and at bedtime. (0900 & 2100)   Calcium 500 + D3 500-200 MG-UNIT Tabs Generic drug: Calcium Carb-Cholecalciferol Take 1 tablet by mouth daily. (0900)   clopidogrel 75 MG tablet Commonly known as: PLAVIX Take 1 tablet (75 mg total) by mouth daily with breakfast. Start taking on: June 20, 2020   diclofenac Sodium 1 % Gel Commonly known as: VOLTAREN Apply 2 g topically in the morning and at bedtime. (0900 & 2100)   fluticasone 50 MCG/ACT nasal spray Commonly known as: FLONASE Place 1 spray into both nostrils in the morning and at bedtime. (0900 & 1700)   furosemide 40 MG tablet Commonly known as: LASIX Take 1 tablet (40 mg total) by mouth daily. What changed: additional instructions   guaiFENesin 600 MG 12 hr tablet Commonly known as: MUCINEX Take 600 mg by mouth 2 (two) times daily. (0900 & 1700)   montelukast 10 MG tablet Commonly known as: SINGULAIR Take 10 mg by mouth at bedtime.   multivitamin with minerals Tabs tablet Take 1 tablet by mouth daily. (0900)   nitroGLYCERIN 0.4 MG SL tablet Commonly known as: NITROSTAT Place 1 tablet (0.4 mg total) under the tongue every 5 (five) minutes x 3 doses as needed for chest pain.   pantoprazole 40 MG tablet Commonly known as: PROTONIX Take 1 tablet (40 mg total) by mouth daily. Start taking on: June 20, 2020 Replaces: esomeprazole 20 MG  capsule   PRESERVISION AREDS 2 PO Take 1 tablet by mouth in the morning and at bedtime. (0900 & 1700)   rivastigmine 3 MG capsule Commonly known as: EXELON Take 3 mg by mouth 2 (two) times daily. (0900 & 1700)   Saw Palmetto 500 MG Caps Take 500 mg by mouth daily at 12 noon. (1200)   sertraline 25 MG tablet Commonly known as: ZOLOFT Take 25 mg by mouth at bedtime. (2100)          Outstanding Labs/Studies   N/A.  Duration of Discharge Encounter   Greater than 30 minutes including physician  time.  Signed, Corrin Parker, PA-C 06/19/2020, 10:07 AM

## 2020-06-19 NOTE — Progress Notes (Signed)
Patient transported via wheelchair to son's personal vehicle in stable condition. Son, Gerlene Burdock, to transport to [employer].

## 2020-06-22 ENCOUNTER — Telehealth: Payer: Self-pay | Admitting: Physician Assistant

## 2020-06-22 NOTE — Telephone Encounter (Signed)
  [facility]   MULTIDISCIPLINARY HEART VALVE TEAM   Patient contacted regarding discharge from [facility] on 4/8  Patient understands to follow up with provider Dr. Flora Lipps on 4/15 at [address].  Patient understands discharge instructions? yes Patient understands medications and regimen? yes Patient understands to bring all medications to this visit? yes  Has had some more fatigue since discharge but seems to be doing better now with naps using cpap and drinking more water.  Cline Crock PA-C  [facility]

## 2020-06-23 NOTE — Telephone Encounter (Signed)
Thanks Katie.

## 2020-06-25 NOTE — Progress Notes (Signed)
Cardiology Office Note:   Date:  06/26/2020  NAME:  Adrian [name]    MRN: [id number] DOB:  1928/02/03   PCP:  Almetta LovelyHousecalls, [facility] Making  Cardiologist:  Reatha HarpsWesley T O'Neal, MD  Electrophysiologist:  None   Referring MD: Almetta LovelyHousecalls, Doctors Mak*   Chief Complaint  Patient presents with  . Follow-up    History of Present Illness:   Adrian [name] is a 87 y.o. male with a hx of intracranial hemorrhage on Coumadin, persistent A. fib, OSA, severe mitral regurgitation status post mitra clip who presents for follow-up.  Recent admission to the hospital in March of this year found to have severe MR.  Continue to have persistent symptoms underwent MitraClip on 06/18/2020.  He presents for postprocedure follow-up. He presents with his son.  Weights have increased 3 to 4 pounds since leaving the hospital.  He has intermittent shortness of breath which occurs at rest.  He is more active and doing well.  Denies any chest pain.  No increased lower extremity edema.  He does have noticeably elevated JVD on examination as well as some abdominal distention.  EKG in office demonstrates atrial fibrillation with heart rate in the 80s.  Right femoral cath site looks clean and dry.  He does have some bruising in the area but no evidence of hematoma.  Vital signs are stable on examination.  He was counseled on SBE prophylaxis.  He understands he needs this.   Problem List 1. Intracranial hemorrhage  -while on coumadin in past 2. HTN 3. Persistent Afib  -CHADSVASC=3 (age, HTN) -EF 60-65% -normal MPI 11/2019 ([facility]) 4. OSA 5. Severe mitral regurgitation -P2 flail -mitraclip (06/18/2020)  6. CAD -80% dLAD  Past Medical History: Past Medical History:  Diagnosis Date  . GERD (gastroesophageal reflux disease)   . History of intracranial hemorrhage   . OSA (obstructive sleep apnea)   . Persistent atrial fibrillation (HCC)    not on oral anticoagulation due to hx of ICH  . S/P mitral valve clip implantation  06/18/2020   s/p TEER using a single MitraClip G4 XTW device positioned on the medial aspect of A2/P2 by Dr. Excell Seltzerooper  . Severe mitral regurgitation     Past Surgical History: Past Surgical History:  Procedure Laterality Date  . CRANIOTOMY    . MITRAL VALVE REPAIR N/A 06/18/2020   Procedure: MITRAL VALVE REPAIR;  Surgeon: Tonny Bollmanooper, Michael, MD;  [facility];  Service: Cardiovascular;  Laterality: N/A;  . RIGHT/LEFT HEART CATH AND CORONARY ANGIOGRAPHY N/A 06/03/2020   Procedure: RIGHT/LEFT HEART CATH AND CORONARY ANGIOGRAPHY;  Surgeon: Kathleene HazelMcAlhany, Christopher D, MD;  Location: [facility] INVAS[facility] CV LAB;  Service: Cardiovascular;  Laterality: N/A;  . SHOULDER SURGERY    . TEE WITHOUT CARDIOVERSION N/A 05/22/2020   Procedure: TRANSESOPHAGEAL ECHOCARDIOGRAM (TEE);  Surgeon: Jodelle Redhristopher, Bridgette, MD;  [facility];  Service: Cardiovascular;  Laterality: N/A;  . TEE WITHOUT CARDIOVERSION N/A 06/18/2020   Procedure: TRANSESOPHAGEAL ECHOCARDIOGRAM (TEE);  Surgeon: Tonny Bollmanooper, Michael, MD;  [facility];  Service: Cardiovascular;  Laterality: N/A;    Current Medications: Current Meds  Medication Sig  . acetaminophen (TYLENOL) 500 MG tablet Take 500 mg by mouth in the morning, at noon, and at bedtime. (0900, 1300 & 2100)  . aspirin 81 MG EC tablet Take 81 mg by mouth daily. (0900)  . Calcium Carb-Cholecalciferol (CALCIUM 500 + D3) 500-200 MG-UNIT TABS Take 1 tablet by mouth daily. (0900)  . clopidogrel (PLAVIX) 75 MG tablet Take 1 tablet (75  mg total) by mouth daily with breakfast.  . diclofenac Sodium (VOLTAREN) 1 % GEL Apply 2 g topically in the morning and at bedtime. (0900 & 2100)  . fluticasone (FLONASE) 50 MCG/ACT nasal spray Place 1 spray into both nostrils in the morning and at bedtime. (0900 & 1700)  . gabapentin (NEURONTIN) 100 MG capsule Take 100 mg by mouth 3 (three) times daily.  Marland Kitchen guaiFENesin (MUCINEX) 600 MG 12 hr tablet Take 600 mg by mouth 2 (two) times  daily. (0900 & 1700)  . montelukast (SINGULAIR) 10 MG tablet Take 10 mg by mouth at bedtime.  . Multiple Vitamin (MULTIVITAMIN WITH MINERALS) TABS tablet Take 1 tablet by mouth daily. (0900)  . Multiple Vitamins-Minerals (PRESERVISION AREDS 2 PO) Take 1 tablet by mouth in the morning and at bedtime. (0900 & 1700)  . nitroGLYCERIN (NITROSTAT) 0.4 MG SL tablet Place 1 tablet (0.4 mg total) under the tongue every 5 (five) minutes x 3 doses as needed for chest pain.  . pantoprazole (PROTONIX) 40 MG tablet Take 1 tablet (40 mg total) by mouth daily.  . rivastigmine (EXELON) 3 MG capsule Take 3 mg by mouth 2 (two) times daily. (0900 & 1700)  . Saw Palmetto 500 MG CAPS Take 500 mg by mouth daily at 12 noon. (1200)  . sertraline (ZOLOFT) 25 MG tablet Take 25 mg by mouth at bedtime. (2100)  . Skin Protectants, Misc. (BAZA PROTECT EX) Apply 1 application topically in the morning and at bedtime. (0900 & 2100)  . [DISCONTINUED] furosemide (LASIX) 40 MG tablet Take 1 tablet (40 mg total) by mouth daily. (Patient taking differently: Take 40 mg by mouth daily. (0900))     Allergies:    Milk protein   Social History: Social History   Socioeconomic History  . Marital status: Widowed    Spouse name: Not on file  . Number of children: 7  . Years of education: Not on file  . Highest education level: Not on file  Occupational History  . Occupation: retired Immunologist  Tobacco Use  . Smoking status: Never Smoker  . Smokeless tobacco: Never Used  Vaping Use  . Vaping Use: Never used  Substance and Sexual Activity  . Alcohol use: Never  . Drug use: Never  . Sexual activity: Not on file  Other Topics Concern  . Not on file  Social History Narrative  . Not on file   Social Determinants of Health   Financial Resource Strain: Not on file  Food Insecurity: Not on file  Transportation Needs: Not on file  Physical Activity: Not on file  Stress: Not on file  Social Connections: Not on file      Family History: The patient's family history includes Cancer in his father.  ROS:   All other ROS reviewed and negative. Pertinent positives noted in the HPI.     EKGs/Labs/Other Studies Reviewed:   The following studies were personally reviewed by me today:  EKG:  EKG is ordered today.  The ekg ordered today demonstrates atrial fibrillation heart rate 86, no acute ischemic changes or evidence of infarction, and was personally reviewed by me.   TTE 06/19/2020 1. Left ventricular ejection fraction, by estimation, is 60 to 65%. The  left ventricle has normal function. The left ventricle has no regional  wall motion abnormalities. Left ventricular diastolic function could not  be evaluated.  2. Right ventricular systolic function is normal. The right ventricular  size is normal. There is mildly elevated pulmonary artery  systolic  pressure. The estimated right ventricular systolic pressure is 44.2 mmHg.  3. Left atrial size was severely dilated.  4. Right atrial size was moderately dilated.  5. A stable MitraClip is seen in the A2-P2 location. There is  mild-moderate residual mitral insufficiency, mostly medial to the clip,  similar to the immediate post-procedure result. There is a mild increase  in transmitral diastolic gradients. Pressure  half time 95 ms. The mitral valve has been repaired/replaced. Mild to  moderate mitral valve regurgitation. Mild mitral stenosis. The mean mitral  valve gradient is 5.0 mmHg with average heart rate of 82 bpm. Procedure  Date: 06/18/2020.  6. The tricuspid valve is myxomatous. Tricuspid valve regurgitation is  moderate.  7. The aortic valve is tricuspid. Aortic valve regurgitation is mild.  Mild aortic valve sclerosis is present, with no evidence of aortic valve  stenosis.  8. Aortic dilatation noted. There is borderline dilatation of the aortic  root, measuring 39 mm.  9. The inferior vena cava is dilated in size with >50% respiratory   variability, suggesting right atrial pressure of 8 mmHg.  10. Evidence of atrial level shunting detected by color flow Doppler.  There is a small iatrogenic secundum atrial septal defect with  predominantly left to right shunting across the atrial septum.   Recent Labs: 04/23/2020: TSH 4.190 05/21/2020: B Natriuretic Peptide 834.4 06/16/2020: ALT 22 06/19/2020: BUN 23; Creatinine, Ser 1.22; Hemoglobin 10.5; Platelets 222; Potassium 4.6; Sodium 133   Recent Lipid Panel No results found for: CHOL, TRIG, HDL, CHOLHDL, VLDL, LDLCALC, LDLDIRECT  Physical Exam:   VS:  BP 134/78   Pulse 86   Ht 5\' 7"  (1.702 m)   Wt 156 lb (70.8 kg)   SpO2 96%   BMI 24.43 kg/m    Wt Readings from Last 3 Encounters:  06/26/20 156 lb (70.8 kg)  06/18/20 154 lb 1.6 oz (69.9 kg)  06/16/20 154 lb 1 oz (69.9 kg)    General: Well nourished, well developed, in no acute distress Head: Atraumatic, normal size  Eyes: PEERLA, EOMI  Neck: Supple, JVD 10-12 cmH2O, positive HJR Endocrine: No thryomegaly Cardiac: Normal S1, S2; irregular rhythm, 3 out of 6 holosystolic murmur Lungs: Clear to auscultation bilaterally, no wheezing, rhonchi or rales  Abd: Soft, nontender, no hepatomegaly  Ext: No edema, pulses 2+ Musculoskeletal: No deformities, BUE and BLE strength normal and equal Skin: Warm and dry, no rashes   Neuro: Alert and oriented to person, place, time, and situation, CNII-XII grossly intact, no focal deficits  Psych: Normal mood and affect   ASSESSMENT:   Hesham [name] is a 87 y.o. male who presents for the following: 1. Severe mitral insufficiency   2. S/P mitral valve clip implantation   3. Chronic diastolic heart failure (HCC)   4. Persistent atrial fibrillation (HCC)   5. Primary hypertension   6. Coronary artery disease involving native coronary artery of native heart without angina pectoris     PLAN:   1. Severe mitral insufficiency 2. S/P mitral valve clip implantation 3. Chronic diastolic  heart failure (HCC) -Status post MitraClip for severe mitral regurgitation in the setting of a flail P2 segment on 06/18/2020.  Right femoral cath site looks clean and dry.  There is minimal bruising but no hematoma. -He still has a murmur consistent with at least moderate mitral regurgitation.  I wonder if we did not get a good of a result.  We will see what his follow-up echocardiogram shows in 1 month. -  He is slightly volume up on examination today.  We will increase his Lasix to 40 mg twice a day for 3 days and then resume 40 mg daily.  I have asked him to reach out to his facility to determine the salt level that is in the food prepared.  He does not prepare his own food. -He was counseled about the need for ongoing aspirin therapy.  He is also on Plavix.  No bleeding issues reported.  Plavix likely come off in the next few months. -He should continue aspirin indefinitely. -He was counseled on SBE prophylaxis.  He will need this before dental work.  4. Persistent atrial fibrillation (HCC) -Rate controlled.  He is on no medication for this.  We will continue to monitor this. -He is not on anticoagulation due to prior intracerebral hemorrhage on Coumadin.  5. Primary hypertension -Well-controlled.  No change in medications.  6. Coronary artery disease involving native coronary artery of native heart without angina pectoris -Left heart catheterization demonstrated severe 80% distal LAD disease.  No symptoms of angina.  Continue aspirin.  He will take nitroglycerin as needed.  This could just represent a myocardial bridge.  Disposition: Return in about 3 months (around 09/25/2020).  Medication Adjustments/Labs and Tests Ordered: Current medicines are reviewed at length with the patient today.  Concerns regarding medicines are outlined above.  Orders Placed This Encounter  Procedures  . EKG 12-Lead   Meds ordered this encounter  Medications  . furosemide (LASIX) 40 MG tablet    Sig: Take 1  tablet (40 mg) by mouth twice daily for 3 days, then decrease to 1 tablet (40 mg) daily.    Dispense:  90 tablet    Refill:  3    Patient Instructions  Medication Instructions:  Increase Lasix to 40 mg twice daily for 3 days, then resume 40 mg daily. (we will send this over to your facility)  *If you need a refill on your cardiac medications before your next appointment, please call your pharmacy*   Follow-Up: At [facility], you and your health needs are our priority.  As part of our continuing mission to provide you with exceptional heart care, we have created designated Provider Care Teams.  These Care Teams include your primary Cardiologist (physician) and Advanced Practice Providers (APPs -  Physician Assistants and Nurse Practitioners) who all work together to provide you with the care you need, when you need it.  We recommend signing up for the patient portal called "MyChart".  Sign up information is provided on this After Visit Summary.  MyChart is used to connect with patients for Virtual Visits (Telemedicine).  Patients are able to view lab/test results, encounter notes, upcoming appointments, etc.  Non-urgent messages can be sent to your provider as well.   To learn more about what you can do with MyChart, go to [email or web address].    Your next appointment:   3 month(s)  The format for your next appointment:   In Person  Provider:   Lennie Odor, MD       Time Spent with Patient: I have spent a total of 35 minutes with patient reviewing hospital notes, telemetry, EKGs, labs and examining the patient as well as establishing an assessment and plan that was discussed with the patient.  > 50% of time was spent in direct patient care.  Signed, Lenna Gilford. Flora Lipps, MD, [facility]  [facility]  [address], [address] [place], [place] [address] [phone]  06/26/2020  4:41 PM

## 2020-06-26 ENCOUNTER — Other Ambulatory Visit: Payer: Self-pay

## 2020-06-26 ENCOUNTER — Encounter: Payer: Self-pay | Admitting: Cardiovascular Disease

## 2020-06-26 ENCOUNTER — Ambulatory Visit: Payer: Medicare HMO | Admitting: Cardiovascular Disease

## 2020-06-26 VITALS — BP 134/78 | HR 86 | Ht 67.0 in | Wt 156.0 lb

## 2020-06-26 DIAGNOSIS — I5032 Chronic diastolic (congestive) heart failure: Secondary | ICD-10-CM | POA: Diagnosis not present

## 2020-06-26 DIAGNOSIS — I340 Nonrheumatic mitral (valve) insufficiency: Secondary | ICD-10-CM | POA: Diagnosis not present

## 2020-06-26 DIAGNOSIS — Z9889 Other specified postprocedural states: Secondary | ICD-10-CM

## 2020-06-26 DIAGNOSIS — I2510 Atherosclerotic heart disease of native coronary artery without angina pectoris: Secondary | ICD-10-CM

## 2020-06-26 DIAGNOSIS — Z95818 Presence of other cardiac implants and grafts: Secondary | ICD-10-CM

## 2020-06-26 DIAGNOSIS — I4819 Other persistent atrial fibrillation: Secondary | ICD-10-CM | POA: Diagnosis not present

## 2020-06-26 DIAGNOSIS — I10 Essential (primary) hypertension: Secondary | ICD-10-CM

## 2020-06-26 MED ORDER — FUROSEMIDE 40 MG PO TABS: ORAL_TABLET | ORAL | 3 refills | Status: DC

## 2020-06-26 NOTE — Patient Instructions (Signed)
Medication Instructions:  Increase Lasix to 40 mg twice daily for 3 days, then resume 40 mg daily. (we will send this over to your facility)  *If you need a refill on your cardiac medications before your next appointment, please call your pharmacy*   Follow-Up: At [facility], you and your health needs are our priority.  As part of our continuing mission to provide you with exceptional heart care, we have created designated Provider Care Teams.  These Care Teams include your primary Cardiologist (physician) and Advanced Practice Providers (APPs -  Physician Assistants and Nurse Practitioners) who all work together to provide you with the care you need, when you need it.  We recommend signing up for the patient portal called "MyChart".  Sign up information is provided on this After Visit Summary.  MyChart is used to connect with patients for Virtual Visits (Telemedicine).  Patients are able to view lab/test results, encounter notes, upcoming appointments, etc.  Non-urgent messages can be sent to your provider as well.   To learn more about what you can do with MyChart, go to [email or web address].    Your next appointment:   3 month(s)  The format for your next appointment:   In Person  Provider:   Lennie Odor, MD

## 2020-07-09 ENCOUNTER — Other Ambulatory Visit (HOSPITAL_BASED_OUTPATIENT_CLINIC_OR_DEPARTMENT_OTHER): Payer: Self-pay

## 2020-07-09 DIAGNOSIS — G4731 Primary central sleep apnea: Secondary | ICD-10-CM

## 2020-07-09 DIAGNOSIS — G4733 Obstructive sleep apnea (adult) (pediatric): Secondary | ICD-10-CM

## 2020-07-23 ENCOUNTER — Ambulatory Visit (HOSPITAL_COMMUNITY): Payer: Medicare HMO | Attending: Cardiology

## 2020-07-23 ENCOUNTER — Ambulatory Visit: Payer: Medicare HMO | Admitting: Physician Assistant

## 2020-07-23 ENCOUNTER — Other Ambulatory Visit: Payer: Self-pay

## 2020-07-23 ENCOUNTER — Encounter: Payer: Self-pay | Admitting: Physician Assistant

## 2020-07-23 VITALS — BP 128/62 | HR 77 | Ht 67.0 in | Wt 156.0 lb

## 2020-07-23 DIAGNOSIS — Z9889 Other specified postprocedural states: Secondary | ICD-10-CM | POA: Diagnosis present

## 2020-07-23 DIAGNOSIS — Z95818 Presence of other cardiac implants and grafts: Secondary | ICD-10-CM

## 2020-07-23 DIAGNOSIS — I5032 Chronic diastolic (congestive) heart failure: Secondary | ICD-10-CM

## 2020-07-23 DIAGNOSIS — I2510 Atherosclerotic heart disease of native coronary artery without angina pectoris: Secondary | ICD-10-CM | POA: Diagnosis not present

## 2020-07-23 DIAGNOSIS — I340 Nonrheumatic mitral (valve) insufficiency: Secondary | ICD-10-CM | POA: Diagnosis present

## 2020-07-23 DIAGNOSIS — I10 Essential (primary) hypertension: Secondary | ICD-10-CM

## 2020-07-23 DIAGNOSIS — I4819 Other persistent atrial fibrillation: Secondary | ICD-10-CM | POA: Diagnosis not present

## 2020-07-23 LAB — ECHOCARDIOGRAM COMPLETE
Area-P 1/2: 2.72 cm2
MV VTI: 2.53 cm2
P 1/2 time: 579 msec
S' Lateral: 2.5 cm

## 2020-07-23 MED ORDER — AMOXICILLIN 500 MG PO TABS: ORAL_TABLET | ORAL | 12 refills | Status: DC

## 2020-07-23 NOTE — Progress Notes (Signed)
[facility]   MULTIDISCIPLINARY HEART VALVE CLINIC                                     Cardiology Office Note:    Date:  07/24/2020   ID:  Adrian [name], DOB 1927/11/29, MRN [id number]  PCP:  [facility]  [facility] Cardiologist:  Reatha Harps, MD  [facility] Electrophysiologist:  None   Referring MD: [facility]call[facility], Doctors [facility]*   1 month s/p TEER   History of Present Illness:    Adrian [name] is a 87 y.o. male with a hx of persistent atrial fibrillation not on anticoagulation due to history of intracranial hemorrhage while on Coumadin, obstructive sleep apnea on CPAP, GERD and severe MR s/p TEER (06/18/20) who presents to clinic for follow up.  Patient recently had fairly acute onset of dyspnea as well as somnolence, weakness, and fatigue about 2 months ago. Echo showed newly diagnosed severe MR and chest x-ray demonstrated bilateral pulmonary infiltrates suggestive of pneumonia which in retrospect were likely related to acute CHF. He was hospitalized in 05/2020 and diuresed. TEE during admission showed prolapse of the posterior leaflet of the mitral valve with suspected partial flail segment. He became severely hypotensive during TEE and the degree of MR may have been underestimated because of hemodynamic state. R/LHC showed mild disease in the mid LAD with severe 80% stenosis of distal LAD just before the vessel wraps around the apex. Medical therapy was recommendation for CAD. Patient was referred to Structural Heart Team and seen by Dr. Excell Seltzer as outpatient on 05/26/2020. At that time, he reported continued shortness of breath with low levels of activity since discharge but denied any orthopnea or PND. Decision was made to proceed with MitraClip.   He was evaluated by the multidisciplinary valve team and underwent underwent successful transcatheter edge-to-edge mitral valve repair using a single MitraClip G4 XTW device positioned on the medial aspect of  A2/P2 with reduction in mitral regurgitation from 3+ at baseline to 2+ post procedure. There was significant V wave reduction following MitraClip deployment. Pulmonary vein flow was initially blunted but was forward flow post clip deployment. POD 1 echo showed mild to moderate residual MR. He was discharged home on aspirin and plavix x 3 months.   He was seen by Dr. Flora Lipps for post hospital follow up on 4/15. His murmur was noted to still be prominent and he was volume overloaded. His lasix was increased for 3 days.   Today he presents to clinic for follow up. Here with son. DOing well with an improvement in breathing. He is able to bike for 15 minutes at a time with PT with no issues. No CP or SOB. No LE edema, orthopnea or PND. No dizziness or syncope. No blood in stool or urine. No palpitations. Daughter did notice a couple episodes of dyspnea that they were told were related to central sleep apnea.    Past Medical History:  Diagnosis Date  . GERD (gastroesophageal reflux disease)   . History of intracranial hemorrhage   . OSA (obstructive sleep apnea)   . Persistent atrial fibrillation (HCC)    not on oral anticoagulation due to hx of ICH  . 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 Excell Seltzer  . Severe mitral regurgitation  Past Surgical History:  Procedure Laterality Date  . CRANIOTOMY    . MITRAL VALVE REPAIR N/A 06/18/2020   Procedure: MITRAL VALVE REPAIR;  Surgeon: Tonny Bollman, MD;  Location: [facility] INVASIVE CV LAB;  Service: Cardiovascular;  Laterality: N/A;  . RIGHT/LEFT HEART CATH AND CORONARY ANGIOGRAPHY N/A 06/03/2020   Procedure: RIGHT/LEFT HEART CATH AND CORONARY ANGIOGRAPHY;  Surgeon: Kathleene Hazel, MD;  Location: [facility] INVASIVE CV LAB;  Service: Cardiovascular;  Laterality: N/A;  . SHOULDER SURGERY    . TEE WITHOUT CARDIOVERSION N/A 05/22/2020   Procedure:  TRANSESOPHAGEAL ECHOCARDIOGRAM (TEE);  Surgeon: Jodelle Red, MD;  Location: [facility] ENDOSCOPY;  Service: Cardiovascular;  Laterality: N/A;  . TEE WITHOUT CARDIOVERSION N/A 06/18/2020   Procedure: TRANSESOPHAGEAL ECHOCARDIOGRAM (TEE);  Surgeon: Tonny Bollman, MD;  Location: [facility] INVASIVE CV LAB;  Service: Cardiovascular;  Laterality: N/A;    Current Medications: Current Meds  Medication Sig  . acetaminophen (TYLENOL) 500 MG tablet Take 500 mg by mouth in the morning, at noon, and at bedtime. (0900, 1300 & 2100)  . amoxicillin (AMOXIL) 500 MG tablet Take 2,000 mg (4 tablets) 1 hour prior to all dental visits.  Marland Kitchen aspirin 81 MG EC tablet Take 81 mg by mouth daily. (0900)  . Calcium Carb-Cholecalciferol (CALCIUM 500 + D3) 500-200 MG-UNIT TABS Take 1 tablet by mouth daily. (0900)  . clopidogrel (PLAVIX) 75 MG tablet Take 1 tablet (75 mg total) by mouth daily with breakfast.  . diclofenac Sodium (VOLTAREN) 1 % GEL Apply 2 g topically in the morning and at bedtime. (0900 & 2100)  . fluticasone (FLONASE) 50 MCG/ACT nasal spray Place 1 spray into both nostrils in the morning and at bedtime. (0900 & 1700)  . furosemide (LASIX) 40 MG tablet Take 1 tablet (40 mg) by mouth twice daily for 3 days, then decrease to 1 tablet (40 mg) daily.  Marland Kitchen gabapentin (NEURONTIN) 100 MG capsule Take 100 mg by mouth 3 (three) times daily.  Marland Kitchen guaiFENesin (MUCINEX) 600 MG 12 hr tablet Take 600 mg by mouth 2 (two) times daily. (0900 & 1700)  . montelukast (SINGULAIR) 10 MG tablet Take 10 mg by mouth at bedtime.  . Multiple Vitamin (MULTIVITAMIN WITH MINERALS) TABS tablet Take 1 tablet by mouth daily. (0900)  . Multiple Vitamins-Minerals (PRESERVISION AREDS 2 PO) Take 1 tablet by mouth in the morning and at bedtime. (0900 & 1700)  . nitroGLYCERIN (NITROSTAT) 0.4 MG SL tablet Place 1 tablet (0.4 mg total) under the tongue every 5 (five) minutes x 3 doses as needed for chest pain.  . pantoprazole (PROTONIX) 40 MG tablet Take 1  tablet (40 mg total) by mouth daily.  . rivastigmine (EXELON) 3 MG capsule Take 3 mg by mouth 2 (two) times daily. (0900 & 1700)  . Saw Palmetto 500 MG CAPS Take 500 mg by mouth daily at 12 noon. (1200)  . sertraline (ZOLOFT) 25 MG tablet Take 25 mg by mouth at bedtime. (2100)  . Skin Protectants, Misc. (BAZA PROTECT EX) Apply 1 application topically in the morning and at bedtime. (0900 & 2100)     Allergies:   Milk protein   Social History   Socioeconomic History  . Marital status: Widowed    Spouse name: Not on file  . Number of children: 7  . Years of education: Not on file  . Highest education level: Not on file  Occupational History  . Occupation: retired Immunologist  Tobacco Use  . Smoking status: Never Smoker  . Smokeless tobacco: Never  Used  Vaping Use  . Vaping Use: Never used  Substance and Sexual Activity  . Alcohol use: Never  . Drug use: Never  . Sexual activity: Not on file  Other Topics Concern  . Not on file  Social History Narrative  . Not on file   Social Determinants of Health   Financial Resource Strain: Not on file  Food Insecurity: Not on file  Transportation Needs: Not on file  Physical Activity: Not on file  Stress: Not on file  Social Connections: Not on file     Family History: The patient's family history includes Cancer in his father.  ROS:   Please see the history of present illness.    All other systems reviewed and are negative.  EKGs/Labs/Other Studies Reviewed:    The following studies were reviewed today:  Mitral Valve Repair 06/18/2020: Successful transcatheter edge-to-edge mitral valve repair using a single MitraClip G4 XTW device positioned on the medial aspect of A2/P2 with reduction in mitral regurgitation from 3+ at baseline to 2+ post procedure.  There is significant V wave reduction following MitraClip deployment. Pulmonary vein flow was initially blunted and is forward flow post clip  deployment.  _______________  Echo 06/19/2020: IMPRESSIONS 1. Left ventricular ejection fraction, by estimation, is 60 to 65%. The  left ventricle has normal function. The left ventricle has no regional  wall motion abnormalities. Left ventricular diastolic function could not  be evaluated.  2. Right ventricular systolic function is normal. The right ventricular  size is normal. There is mildly elevated pulmonary artery systolic  pressure. The estimated right ventricular systolic pressure is 44.2 mmHg.  3. Left atrial size was severely dilated.  4. Right atrial size was moderately dilated.  5. A stable MitraClip is seen in the A2-P2 location. There is  mild-moderate residual mitral insufficiency, mostly medial to the clip,  similar to the immediate post-procedure result. There is a mild increase  in transmitral diastolic gradients. Pressure  half time 95 ms. The mitral valve has been repaired/replaced. Mild to  moderate mitral valve regurgitation. Mild mitral stenosis. The mean mitral  valve gradient is 5.0 mmHg with average heart rate of 82 bpm. Procedure  Date: 06/18/2020.  6. The tricuspid valve is myxomatous. Tricuspid valve regurgitation is  moderate.  7. The aortic valve is tricuspid. Aortic valve regurgitation is mild.  Mild aortic valve sclerosis is present, with no evidence of aortic valve  stenosis.  8. Aortic dilatation noted. There is borderline dilatation of the aortic  root, measuring 39 mm.  9. The inferior vena cava is dilated in size with >50% respiratory  variability, suggesting right atrial pressure of 8 mmHg.  10. Evidence of atrial level shunting detected by color flow Doppler.  There is a small iatrogenic secundum atrial septal defect with  predominantly left to right shunting across the atrial septum.   _________________   Echo 07/23/20 IMPRESSIONS  1. Left ventricular ejection fraction, by estimation, is 60 to 65%. The  left ventricle has  normal function. The left ventricle has no regional  wall motion abnormalities. There is mild left ventricular hypertrophy.  Left ventricular diastolic parameters  are indeterminate.  2. Right ventricular systolic function is mildly reduced. The right  ventricular size is mildly enlarged. Tricuspid regurgitation signal is  inadequate for assessing PA pressure.  3. There is a Mitra-Clip present in the A2-P2 position. Mild mitral  stenosis. MG . Mild to moderate mitral valve regurgitation. Eccentric  anterior directed MR jet. Difficult to  quantify given eccentric jet, but  no LA/LV dilatation or PV systolic reversal seen to suggest significant MR and visually appears improved from prior echo 06/19/20  4. Tricuspid valve regurgitation is mild to moderate.  5. The aortic valve is tricuspid. Aortic valve regurgitation is mild.  Mild to moderate aortic valve sclerosis/calcification is present, without  any evidence of aortic stenosis.  6. Aortic dilatation noted. There is mild dilatation of the ascending  aorta, measuring 38 mm.  7. The inferior vena cava is normal in size with greater than 50%  respiratory variability, suggesting right atrial pressure of 3 mmHg.   EKG:  EKG is NOT ordered today.   Recent Labs: 04/23/2020: TSH 4.190 05/21/2020: B Natriuretic Peptide 834.4 06/16/2020: ALT 22 06/19/2020: BUN 23; Creatinine, Ser 1.22; Hemoglobin 10.5; Platelets 222; Potassium 4.6; Sodium 133  Recent Lipid Panel No results found for: CHOL, TRIG, HDL, CHOLHDL, VLDL, LDLCALC, LDLDIRECT   Risk Assessment/Calculations:    CHA2DS2-VASc Score = 7  This indicates a 11.2% annual risk of stroke. The patient's score is based upon: CHF History: Yes HTN History: Yes Diabetes History: No Stroke History: Yes Vascular Disease History: Yes Age Score: 2 Gender Score: 0      Physical Exam:    VS:  BP 128/62   Pulse 77   Ht 5\' 7"  (1.702 m)   Wt 156 lb (70.8 kg)   SpO2 93%   BMI 24.43  kg/m     Wt Readings from Last 3 Encounters:  07/23/20 156 lb (70.8 kg)  06/26/20 156 lb (70.8 kg)  06/18/20 154 lb 1.6 oz (69.9 kg)     GEN:  Well nourished, well developed in no acute distress HEENT: Normal NECK: No JVD; No carotid bruits LYMPHATICS: No lymphadenopathy CARDIAC: irreg irreg, 2/6 HSM at apex/ No rubs, gallops RESPIRATORY:  Clear to auscultation without rales, wheezing or rhonchi  ABDOMEN: Soft, non-tender, non-distended MUSCULOSKELETAL:  No edema; No deformity  SKIN: Warm and dry NEUROLOGIC:  Alert and oriented x 3 PSYCHIATRIC:  Normal affect   ASSESSMENT:    1. S/P mitral valve clip implantation   2. Chronic diastolic heart failure (HCC)   3. Persistent atrial fibrillation (HCC)   4. Coronary artery disease involving native coronary artery of native heart without angina pectoris   5. Primary hypertension    PLAN:    In order of problems listed above:  Severe MR s/p TEER: echo today shows EF 60-65%, normally functioning Mitaclip with mild-moderate residual MR with a mean gradient of 5 mm hg. There was an eccentric anterior directed MR jet. Difficult to quantify given eccentric jet, but no LA/LV dilatation or PV systolic reversal seen to suggest significant MR and visually appears improved from prior echo 06/19/20  There is moderate TR. He has NYHA class I symptoms and can exercise on a bike for 15 minutes with no issues. Continue aspirin and plavix. He can stop plavix after 3 months. SBE prophylaxis discussed; I have RX'd amoxicillin. I will see him back in 1 year with an echo.   Chronic diastolic CHF: appears euvolemic. Continue lasix 40 mg daily.   CAD: LHC demonstrated severe 80% distal LAD disease.  No symptoms of angina. Continue aspirin  HTN: BP well controlled today. No changes made  Medication Adjustments/Labs and Tests Ordered: Current medicines are reviewed at length with the patient today.  Concerns regarding medicines are outlined above.  No  orders of the defined types were placed in this encounter.  Meds ordered this  encounter  Medications  . amoxicillin (AMOXIL) 500 MG tablet    Sig: Take 2,000 mg (4 tablets) 1 hour prior to all dental visits.    Dispense:  8 tablet    Refill:  12    Patient Instructions  Medication Instructions:  1) you may STOP PLAVIX around 09/17/20 when your pills run out 2) Your provider discussed the importance of taking an antibiotic prior to all dental visits to prevent damage to the heart valves from infection. You were given a prescription for AMOXICILLIN 2,000 mg to take one hour prior to any dental appointment.  *If you need a refill on your cardiac medications before your next appointment, please call your pharmacy*   Follow-Up: Please keep your appointment with Dr. Flora Lipps as scheduled.  We will call you to arrange your 1 year visits.    Signed, Cline Crock, PA-C  07/24/2020 2:02 PM    [facility]

## 2020-07-23 NOTE — Patient Instructions (Signed)
Medication Instructions:  1) you may STOP PLAVIX around 09/17/20 when your pills run out 2) Your provider discussed the importance of taking an antibiotic prior to all dental visits to prevent damage to the heart valves from infection. You were given a prescription for AMOXICILLIN 2,000 mg to take one hour prior to any dental appointment.  *If you need a refill on your cardiac medications before your next appointment, please call your pharmacy*   Follow-Up: Please keep your appointment with Dr. Flora Lipps as scheduled.  We will call you to arrange your 1 year visits.

## 2020-08-13 ENCOUNTER — Other Ambulatory Visit: Payer: Self-pay | Admitting: Student

## 2020-08-20 ENCOUNTER — Other Ambulatory Visit: Payer: Self-pay | Admitting: Student

## 2020-08-20 NOTE — Telephone Encounter (Signed)
Pt's pharmacy is requesting a refill on pantoprazole. Would Dr. Cooper like to refill this medication? Please address 

## 2020-09-06 ENCOUNTER — Ambulatory Visit (HOSPITAL_BASED_OUTPATIENT_CLINIC_OR_DEPARTMENT_OTHER): Payer: Medicare HMO | Attending: Internal Medicine | Admitting: Internal Medicine

## 2020-09-06 ENCOUNTER — Other Ambulatory Visit: Payer: Self-pay

## 2020-09-06 VITALS — Ht 66.0 in | Wt 149.0 lb

## 2020-09-06 DIAGNOSIS — I509 Heart failure, unspecified: Secondary | ICD-10-CM | POA: Diagnosis not present

## 2020-09-06 DIAGNOSIS — G4733 Obstructive sleep apnea (adult) (pediatric): Secondary | ICD-10-CM | POA: Diagnosis not present

## 2020-09-06 DIAGNOSIS — G4731 Primary central sleep apnea: Secondary | ICD-10-CM

## 2020-09-24 NOTE — Procedures (Signed)
NAME: Adrian [name] DATE OF BIRTH:  [birth year] MEDICAL RECORD NUMBER [id number]  LOCATION: [facility]  PHYSICIAN: Deretha Emory  DATE OF STUDY: 09/06/2020  SLEEP STUDY TYPE: Positive Airway Pressure Titration               REFERRING PHYSICIAN: Deretha Emory, MD  EPWORTH SLEEPINESS SCORE:  12 HEIGHT: 5\' 6"  (167.6 cm)  WEIGHT: 149 lb (67.6 kg)    Body mass index is 24.05 kg/m.  NECK SIZE: 14.5 in.  CLINICAL INFORMATION The patient was referred to the sleep center for evaluation of PAP/ASV therapy. He has a history of severe OSA and was on CPAP for years. Download in late 2021 showed well controlled OSA with AHI 2/hr. He then developed central sleep apnea. Subsequently he was found to have a leaking mitral valve and had that repaired. Central sleep apnea persisted. An ONO on CPAP showed many desaturations with CPAP on, although relatively few < 85%. Without CPAP oxygenation was acceptable except in spells that are consistent with REM periods where oxygen level falls into the 70% range.   MEDICATIONS No sleep medicine administered.2022  SLEEP STUDY TECHNIQUE The patient underwent an attended overnight polysomnography titration to assess the effects of cpap therapy. The following variables were monitored: EEG (C4-A1, C3-A2, O1-A2, O2-A1, F3-M2, F4-M1), EOG, submental and leg EMG, ECG, oxyhemoglobin saturation by pulse oximetry, thoracic and abdominal respiratory effort belts, nasal/oral airflow by pressure sensor, body position sensor and snoring sensor. CPAP pressure was titrated to eliminate apneas, hypopneas and oxygen desaturation.  TECHNICAL COMMENTS Comments added by Technician: Patient slept with upper body elevated on bed wedge. The patient's son accompanied him during this study to help him. Comments added by Scorer: N/A  SLEEP ARCHITECTURE The study was initiated at 10:15:46 PM and terminated at 4:59:24 AM. The total recorded time was 403.6 minutes. EEG  confirmed total sleep time was 350.2 minutes yielding a sleep efficiency of 86.8%. Sleep onset after lights out was 9.5 minutes with a REM latency of 98.5 minutes. The patient spent 18.9% of the night in stage N1 sleep, 62.1% in stage N2 sleep, 2.3% in stage N3 and 16.7% in REM. Wake after sleep onset (WASO) was 44.0 minutes. The Arousal Index was 31.2/hour.  RESPIRATORY PARAMETERS The patient was titrated with ASV and ASVAuto. See tech notes in accompanying Scored Report. The most appropriate setting of ASVAuto was EPAP Min 5 and Max 15 cmH2O, Pressure Support Min 3 and Max 20 cmH2O with Max Pressure of 25 cmH2O and Breath Rate of Auto BrPM. He still had long obstructive events on these settings as the ASVAuto did not change EPAP fast enough as he went into REM.   LEG MOVEMENT DATA The limb movement index was 123/hour with an associated arousal index of 8.9/hour. Some vocalization and movement in REM was noted.   CARDIAC DATA The underlying cardiac rhythm was most consistent with sinus rhythm. Mean heart rate during sleep was 73.8 bpm. Additional rhythm abnormalities include Atrial Fibrillation and PVCs, Atrial Flutter.  IMPRESSIONS - Obstructive and Central Sleep apnea. Optimal pressure attained, but see comments above - Significant leg movements during sleep. Associated arousals were significant with an arousal index of 8.9 /hour.  DIAGNOSIS - Obstructive Sleep Apnea (G47.33) - Central Sleep apnea - Periodic Limb Movement During Sleep (G47.61) - Possible dream enactment - Nocturnal Hypoxemia (G47.36)  RECOMMENDATIONS - Trial of ASV Auto with EPAP Min 5 and Max 15 cmH2O, Pressure Support Min 3 and Max  20 cmH2O with Max Pressure of 25 cmH2O and Breath Rate of Auto BrPM - ONO on these settings should be performed.   Deretha Emory Sleep specialist, American Board of Internal Medicine  ELECTRONICALLY SIGNED ON:  09/24/2020, 10:02 PM [facility] PH: [phone]    FX: [phone] ACCREDITED BY THE AMERICAN ACADEMY OF SLEEP MEDICINE

## 2020-10-13 NOTE — Progress Notes (Signed)
Cardiology Office Note:   Date:  10/14/2020  NAME:  Adrian [name]    MRN: [id number] DOB:  [birth year]   PCP:  Almetta Lovely, [facility]  Cardiologist:  Reatha Harps, MD  Electrophysiologist:  None   Referring MD: Almetta Lovely, [facility]*   Chief Complaint  Patient presents with   Follow-up   History of Present Illness:   Adrian [name] is a 87 y.o. male with a hx of severe MR s/p mitraclip, HTN, persistent Afib, OSA, CAD who presents for follow-up.  He presents with his son.  Doing well.  He will have dental tooth extraction.  He has antibiotics understands he needs these before the procedure.  He is having no chest pain or trouble breathing.  He works with physical therapy.  Weight is stable at 152 pounds.  We increased his Lasix at her last appointment.  Seems to have done the job.  He is working with physical therapy and Occupational Therapy.  He appears to be using a seated elliptical 3 times per week.  He does 15 minutes.  No issues.  He walked in the building and seem to do quite well.  He uses a walker.  He presents with his son who is able to corroborate that he is doing well.  No symptoms of angina.  Overall doing remarkably well since MitraClip.  Problem List 1. Intracranial hemorrhage -while on coumadin in past 2. HTN 3. Persistent Afib -CHADSVASC=3 (age, HTN) -EF 60-65% -normal MPI 11/2019 ([facility]) 4. OSA 5. Severe mitral regurgitation -P2 flail -mitraclip (06/18/2020)  6. CAD -80% dLAD  Past Medical History: Past Medical History:  Diagnosis Date   GERD (gastroesophageal reflux disease)    History of intracranial hemorrhage    OSA (obstructive sleep apnea)    Persistent atrial fibrillation (HCC)    not on oral anticoagulation due to hx of ICH   S/P mitral valve clip 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 Excell Seltzer   Severe mitral regurgitation     Past Surgical History: Past  Surgical History:  Procedure Laterality Date   CRANIOTOMY     MITRAL VALVE REPAIR N/A 06/18/2020   Procedure: MITRAL VALVE REPAIR;  Surgeon: Tonny Bollman, MD;  Location: [facility] INVASIVE CV LAB;  Service: Cardiovascular;  Laterality: N/A;   RIGHT/LEFT HEART CATH AND CORONARY ANGIOGRAPHY N/A 06/03/2020   Procedure: RIGHT/LEFT HEART CATH AND CORONARY ANGIOGRAPHY;  Surgeon: Kathleene Hazel, MD;  Location: [facility] INVASIVE CV LAB;  Service: Cardiovascular;  Laterality: N/A;   SHOULDER SURGERY     TEE WITHOUT CARDIOVERSION N/A 05/22/2020   Procedure: TRANSESOPHAGEAL ECHOCARDIOGRAM (TEE);  Surgeon: Jodelle Red, MD;  Location: [facility] ENDOSCOPY;  Service: Cardiovascular;  Laterality: N/A;   TEE WITHOUT CARDIOVERSION N/A 06/18/2020   Procedure: TRANSESOPHAGEAL ECHOCARDIOGRAM (TEE);  Surgeon: Tonny Bollman, MD;  Location: [facility] INVASIVE CV LAB;  Service: Cardiovascular;  Laterality: N/A;    Current Medications: Current Meds  Medication Sig   acetaminophen (TYLENOL) 500 MG tablet Take 500 mg by mouth in the morning, at noon, and at bedtime. (0900, 1300 & 2100)   amoxicillin (AMOXIL) 500 MG tablet Take 2,000 mg (4 tablets) 1 hour prior to all dental visits.   aspirin 81 MG EC tablet Take 81 mg by mouth daily. (0900)   Calcium Carb-Cholecalciferol (CALCIUM 500 + D3) 500-200 MG-UNIT TABS Take 1 tablet by mouth daily. (0900)   diclofenac Sodium (VOLTAREN) 1 % GEL Apply 2  g topically in the morning and at bedtime. (0900 & 2100)   fluticasone (FLONASE) 50 MCG/ACT nasal spray Place 1 spray into both nostrils in the morning and at bedtime. (0900 & 1700)   gabapentin (NEURONTIN) 100 MG capsule Take 400 mg by mouth daily. 400MG  TOTAL DAILY   guaiFENesin (MUCINEX) 600 MG 12 hr tablet Take 600 mg by mouth 2 (two) times daily. (0900 & 1700)   montelukast (SINGULAIR) 10 MG tablet Take 10 mg by mouth at bedtime.   Multiple Vitamin (MULTIVITAMIN WITH MINERALS) TABS tablet Take 1 tablet by mouth daily. (0900)    Multiple Vitamins-Minerals (PRESERVISION AREDS 2 PO) Take 1 tablet by mouth in the morning and at bedtime. (0900 & 1700)   nitroGLYCERIN (NITROSTAT) 0.4 MG SL tablet Place 1 tablet (0.4 mg total) under the tongue every 5 (five) minutes x 3 doses as needed for chest pain.   pantoprazole (PROTONIX) 40 MG tablet Take 1 tablet (40 mg total) by mouth daily.   rivastigmine (EXELON) 3 MG capsule Take 3 mg by mouth 2 (two) times daily. (0900 & 1700)   Saw Palmetto 500 MG CAPS Take 500 mg by mouth daily at 12 noon. (1200)   sertraline (ZOLOFT) 25 MG tablet Take 25 mg by mouth at bedtime. (2100)   Skin Protectants, Misc. (BAZA PROTECT EX) Apply 1 application topically in the morning and at bedtime. (0900 & 2100)   [DISCONTINUED] clopidogrel (PLAVIX) 75 MG tablet TAKE 1 TAB BY MOUTH WITH BREAKFAST   [DISCONTINUED] furosemide (LASIX) 40 MG tablet Take 1 tablet (40 mg) by mouth twice daily for 3 days, then decrease to 1 tablet (40 mg) daily.     Allergies:    Milk protein   Social History: Social History   Socioeconomic History   Marital status: Widowed    Spouse name: Not on file   Number of children: 7   Years of education: Not on file   Highest education level: Not on file  Occupational History   Occupation: retired Immunologist- engineer  Tobacco Use   Smoking status: Never   Smokeless tobacco: Never  Vaping Use   Vaping Use: Never used  Substance and Sexual Activity   Alcohol use: Never   Drug use: Never   Sexual activity: Not on file  Other Topics Concern   Not on file  Social History Narrative   Not on file   Social Determinants of Health   Financial Resource Strain: Not on file  Food Insecurity: Not on file  Transportation Needs: Not on file  Physical Activity: Not on file  Stress: Not on file  Social Connections: Not on file     Family History: The patient's family history includes Cancer in his father.  ROS:   All other ROS reviewed and negative. Pertinent positives noted in the  HPI.     EKGs/Labs/Other Studies Reviewed:   The following studies were personally reviewed by me today:  TTE 07/23/2020  1. Left ventricular ejection fraction, by estimation, is 60 to 65%. The  left ventricle has normal function. The left ventricle has no regional  wall motion abnormalities. There is mild left ventricular hypertrophy.  Left ventricular diastolic parameters  are indeterminate.   2. Right ventricular systolic function is mildly reduced. The right  ventricular size is mildly enlarged. Tricuspid regurgitation signal is  inadequate for assessing PA pressure.   3. There is a Mitra-Clip present in the A2-P2 position. Mild mitral  stenosis. MG 5mmHg. Mild to moderate mitral valve  regurgitation. Eccentric  anterior directed MR jet. Difficult to quantify given eccentric jet, but  no LA/LV dilatation or PV systolic  reversal seen to suggest significant MR and visually appears improved from  prior echo 06/19/20   4. Tricuspid valve regurgitation is mild to moderate.   5. The aortic valve is tricuspid. Aortic valve regurgitation is mild.  Mild to moderate aortic valve sclerosis/calcification is present, without  any evidence of aortic stenosis.   6. Aortic dilatation noted. There is mild dilatation of the ascending  aorta, measuring 38 mm.   7. The inferior vena cava is normal in size with greater than 50%  respiratory variability, suggesting right atrial pressure of 3 mmHg.   Recent Labs: 04/23/2020: TSH 4.190 05/21/2020: B Natriuretic Peptide 834.4 06/16/2020: ALT 22 06/19/2020: BUN 23; Creatinine, Ser 1.22; Hemoglobin 10.5; Platelets 222; Potassium 4.6; Sodium 133   Recent Lipid Panel No results found for: CHOL, TRIG, HDL, CHOLHDL, VLDL, LDLCALC, LDLDIRECT  Physical Exam:   VS:  BP 100/62   Pulse 86   Ht 5\' 7"  (1.702 m)   Wt 152 lb 3.2 oz (69 kg)   SpO2 97%   BMI 23.84 kg/m    Wt Readings from Last 3 Encounters:  10/14/20 152 lb 3.2 oz (69 kg)  09/06/20 149 lb (67.6 kg)   07/23/20 156 lb (70.8 kg)    General: Well nourished, well developed, in no acute distress Head: Atraumatic, normal size  Eyes: PEERLA, EOMI  Neck: Supple, no JVD Endocrine: No thryomegaly Cardiac: Normal S1, S2; irregular rhythm, faint 2 out of 6 holosystolic murmur best heard at the apex Lungs: Clear to auscultation bilaterally, no wheezing, rhonchi or rales  Abd: Soft, nontender, no hepatomegaly  Ext: No edema, pulses 2+ Musculoskeletal: No deformities, BUE and BLE strength normal and equal Skin: Warm and dry, no rashes   Neuro: Alert and oriented to person, place, time, and situation, CNII-XII grossly intact, no focal deficits  Psych: Normal mood and affect   ASSESSMENT:   Adrian [name] is a 87 y.o. male who presents for the following: 1. Chronic diastolic heart failure (HCC)   2. S/P mitral valve clip implantation   3. Persistent atrial fibrillation (HCC)   4. Coronary artery disease involving native coronary artery of native heart without angina pectoris     PLAN:   1. Chronic diastolic heart failure (HCC) 2. S/P mitral valve clip implantation -Status post mitral valve clip procedure on 06/18/2020.  Had severe mitral vegetation in setting of an acute P2 flail.  He has done well since procedure.  Mild to moderate MR remains.  His murmur is very limited to the apex region of his chest.  This also is consistent with mild to moderate MR. -He will have dental extraction.  He needs antibiotics before any dental work.  He understands this. -Overall has done well.  Volume status is acceptable.  He will take Lasix 40 mg daily.  No increased dose needed.  He is watching the salt. -No major issues and is doing quite well since MitraClip procedure.  3. Persistent atrial fibrillation (HCC) -Rate controlled on no medication.  Denies any symptoms from this.  Had intracranial hemorrhage in the past on Coumadin.  We are avoiding anticoagulation due to this reason.  4. Coronary artery disease  involving native coronary artery of native heart without angina pectoris -80% distal LAD lesion.  No symptoms of angina.  Continue to manage medically.  He is on aspirin.  Given his age we  will hold on aggressive statin therapy.  Can consider at her next visit.  Disposition: Return in about 6 months (around 04/16/2021).  Medication Adjustments/Labs and Tests Ordered: Current medicines are reviewed at length with the patient today.  Concerns regarding medicines are outlined above.  No orders of the defined types were placed in this encounter.  Meds ordered this encounter  Medications   furosemide (LASIX) 40 MG tablet    Sig: Take 1 tablet (40 mg total) by mouth daily.    Dispense:  90 tablet    Refill:  3     Patient Instructions  Medication Instructions:   STOP PLAVIX  TAKE FUROSEMIDE 40 MG ONCE DAILY   *If you need a refill on your cardiac medications before your next appointment, please call your pharmacy*   Follow-Up: At [facility], you and your health needs are our priority.  As part of our continuing mission to provide you with exceptional heart care, we have created designated Provider Care Teams.  These Care Teams include your primary Cardiologist (physician) and Advanced Practice Providers (APPs -  Physician Assistants and Nurse Practitioners) who all work together to provide you with the care you need, when you need it.  We recommend signing up for the patient portal called "MyChart".  Sign up information is provided on this After Visit Summary.  MyChart is used to connect with patients for Virtual Visits (Telemedicine).  Patients are able to view lab/test results, encounter notes, upcoming appointments, etc.  Non-urgent messages can be sent to your provider as well.   To learn more about what you can do with MyChart, go to [email or web address].    Your next appointment:   6 month(s)  The format for your next appointment:   In Person  Provider:   Lennie Odor,  MD     Time Spent with Patient: I have spent a total of 35 minutes with patient reviewing hospital notes, telemetry, EKGs, labs and examining the patient as well as establishing an assessment and plan that was discussed with the patient.  > 50% of time was spent in direct patient care.  Signed, Lenna Gilford. Flora Lipps, MD, [facility]  [facility]  [address], [address] [place], [place] [address] [phone]  10/14/2020 9:18 AM

## 2020-10-14 ENCOUNTER — Encounter: Payer: Self-pay | Admitting: Cardiovascular Disease

## 2020-10-14 ENCOUNTER — Ambulatory Visit: Payer: Medicare HMO | Admitting: Cardiovascular Disease

## 2020-10-14 ENCOUNTER — Other Ambulatory Visit: Payer: Self-pay

## 2020-10-14 VITALS — BP 100/62 | HR 86 | Ht 67.0 in | Wt 152.2 lb

## 2020-10-14 DIAGNOSIS — I4819 Other persistent atrial fibrillation: Secondary | ICD-10-CM | POA: Diagnosis not present

## 2020-10-14 DIAGNOSIS — Z9889 Other specified postprocedural states: Secondary | ICD-10-CM

## 2020-10-14 DIAGNOSIS — Z95818 Presence of other cardiac implants and grafts: Secondary | ICD-10-CM

## 2020-10-14 DIAGNOSIS — I2510 Atherosclerotic heart disease of native coronary artery without angina pectoris: Secondary | ICD-10-CM | POA: Diagnosis not present

## 2020-10-14 DIAGNOSIS — I5032 Chronic diastolic (congestive) heart failure: Secondary | ICD-10-CM | POA: Diagnosis not present

## 2020-10-14 MED ORDER — FUROSEMIDE 40 MG PO TABS: 40.0000 mg | ORAL_TABLET | Freq: Every day | ORAL | 3 refills | Status: DC

## 2020-10-14 NOTE — Patient Instructions (Signed)
Medication Instructions:   STOP PLAVIX  TAKE FUROSEMIDE 40 MG ONCE DAILY   *If you need a refill on your cardiac medications before your next appointment, please call your pharmacy*   Follow-Up: At [facility], you and your health needs are our priority.  As part of our continuing mission to provide you with exceptional heart care, we have created designated Provider Care Teams.  These Care Teams include your primary Cardiologist (physician) and Advanced Practice Providers (APPs -  Physician Assistants and Nurse Practitioners) who all work together to provide you with the care you need, when you need it.  We recommend signing up for the patient portal called "MyChart".  Sign up information is provided on this After Visit Summary.  MyChart is used to connect with patients for Virtual Visits (Telemedicine).  Patients are able to view lab/test results, encounter notes, upcoming appointments, etc.  Non-urgent messages can be sent to your provider as well.   To learn more about what you can do with MyChart, go to [email or web address].    Your next appointment:   6 month(s)  The format for your next appointment:   In Person  Provider:   Lennie Odor, MD

## 2021-03-17 ENCOUNTER — Other Ambulatory Visit (HOSPITAL_BASED_OUTPATIENT_CLINIC_OR_DEPARTMENT_OTHER): Payer: Self-pay | Admitting: Neurology

## 2021-03-17 ENCOUNTER — Other Ambulatory Visit: Payer: Self-pay | Admitting: Neurology

## 2021-03-17 ENCOUNTER — Other Ambulatory Visit (HOSPITAL_COMMUNITY): Payer: Self-pay | Admitting: Neurology

## 2021-03-17 DIAGNOSIS — M47812 Spondylosis without myelopathy or radiculopathy, cervical region: Secondary | ICD-10-CM

## 2021-03-27 ENCOUNTER — Ambulatory Visit (HOSPITAL_BASED_OUTPATIENT_CLINIC_OR_DEPARTMENT_OTHER)
Admission: RE | Admit: 2021-03-27 | Discharge: 2021-03-27 | Disposition: A | Discharge status: Home / Self Care | Payer: Medicare HMO | Source: Ambulatory Visit | Attending: Neurology | Admitting: Neurology

## 2021-03-27 ENCOUNTER — Other Ambulatory Visit: Payer: Self-pay

## 2021-03-27 DIAGNOSIS — M47812 Spondylosis without myelopathy or radiculopathy, cervical region: Secondary | ICD-10-CM | POA: Diagnosis present

## 2021-03-30 ENCOUNTER — Other Ambulatory Visit: Payer: Self-pay | Admitting: Physician Assistant

## 2021-03-30 DIAGNOSIS — Z95818 Presence of other cardiac implants and grafts: Secondary | ICD-10-CM

## 2021-03-30 DIAGNOSIS — I340 Nonrheumatic mitral (valve) insufficiency: Secondary | ICD-10-CM

## 2021-04-14 ENCOUNTER — Encounter: Payer: Self-pay | Admitting: Cardiovascular Disease

## 2021-04-20 NOTE — Progress Notes (Signed)
Cardiology Office Note:   Date:  04/21/2021  NAME:  Adrian [name]    MRN: [id number] DOB:  1928/02/19   PCP:  Orvis Brill, [facility]  Cardiologist:  Evalina Field, MD  Electrophysiologist:  None   Referring MD: Housecalls, [facility]*   Chief Complaint  Patient presents with   Follow-up        History of Present Illness:   Adrian [name] is a 88 y.o. male with a hx of severe MR s/p clip, distal CAD, OSA, persistent Afib who presents for follow-up.  He presents for evaluation with his son.  Denies any symptoms of chest pain or trouble breathing.  Blood pressure 84/59.  He denies any dizziness or lightheadedness.  His weight is stable at 152 pounds.  He has no evidence of volume overload.  I do wonder if he is dehydrated.  He denies any fevers or chills or any infectious symptoms.  Lungs are clear.  He is really without symptoms today.  Seems to be doing well.  Working with physical therapy 2 days/week.  He does the exercise bike.  He describes any major symptoms in office.  He has done well since his MitraClip procedure.  Problem List 1. Intracranial hemorrhage -while on coumadin in past 2. HTN 3. Persistent Afib -CHADSVASC=3 (age, HTN) -EF 60-65% -normal MPI 11/2019 ([facility]) 4. OSA 5. Severe mitral regurgitation -P2 flail -mitraclip (06/18/2020)  6. CAD -80% dLAD  Past Medical History: Past Medical History:  Diagnosis Date   GERD (gastroesophageal reflux disease)    History of intracranial hemorrhage    OSA (obstructive sleep apnea)    Persistent atrial fibrillation (HCC)    not on oral anticoagulation due to hx of ICH   S/P mitral valve clip 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 Burt Knack   Severe mitral regurgitation     Past Surgical History: Past Surgical History:  Procedure Laterality Date   CRANIOTOMY     MITRAL VALVE REPAIR N/A 06/18/2020   Procedure: MITRAL VALVE REPAIR;   Surgeon: Sherren Mocha, MD;  Location: [place] [facility];  Service: Cardiovascular;  Laterality: N/A;   RIGHT/LEFT HEART CATH AND CORONARY ANGIOGRAPHY N/A 06/03/2020   Procedure: RIGHT/LEFT HEART CATH AND CORONARY ANGIOGRAPHY;  Surgeon: Burnell Blanks, MD;  Location: [place] [facility];  Service: Cardiovascular;  Laterality: N/A;   SHOULDER SURGERY     TEE WITHOUT CARDIOVERSION N/A 05/22/2020   Procedure: TRANSESOPHAGEAL ECHOCARDIOGRAM (TEE);  Surgeon: Buford Dresser, MD;  Location: [facility];  Service: Cardiovascular;  Laterality: N/A;   TEE WITHOUT CARDIOVERSION N/A 06/18/2020   Procedure: TRANSESOPHAGEAL ECHOCARDIOGRAM (TEE);  Surgeon: Sherren Mocha, MD;  Location: [place] [facility];  Service: Cardiovascular;  Laterality: N/A;    Current Medications: Current Meds  Medication Sig   acetaminophen (TYLENOL) 500 MG tablet Take 500 mg by mouth in the morning, at noon, and at bedtime. (0900, 1300 & 2100)   amoxicillin (AMOXIL) 500 MG tablet Take 2,000 mg (4 tablets) 1 hour prior to all dental visits.   aspirin 81 MG EC tablet Take 81 mg by mouth daily. (0900)   Calcium Carb-Cholecalciferol (CALCIUM 500 + D3) 500-200 MG-UNIT TABS Take 1 tablet by mouth daily. (0900)   diclofenac Sodium (VOLTAREN) 1 % GEL Apply 2 g topically in the morning and at bedtime. (0900 & 2100)   fluticasone (FLONASE) 50 MCG/ACT nasal spray Place 1 spray into both nostrils in  the morning and at bedtime. (0900 & 1700)   guaiFENesin (MUCINEX) 600 MG 12 hr tablet Take 600 mg by mouth 2 (two) times daily. (0900 & 1700)   montelukast (SINGULAIR) 10 MG tablet Take 10 mg by mouth at bedtime.   Multiple Vitamin (MULTIVITAMIN WITH MINERALS) TABS tablet Take 1 tablet by mouth daily. (0900)   Multiple Vitamins-Minerals (PRESERVISION AREDS 2 PO) Take 1 tablet by mouth in the morning and at bedtime. (0900 & 1700)   nitroGLYCERIN (NITROSTAT) 0.4 MG SL tablet Place 1 tablet (0.4 mg total) under the tongue every 5  (five) minutes x 3 doses as needed for chest pain.   pantoprazole (PROTONIX) 40 MG tablet Take 1 tablet (40 mg total) by mouth daily.   rivastigmine (EXELON) 3 MG capsule Take 3 mg by mouth 2 (two) times daily. (0900 & 1700)   Saw Palmetto 500 MG CAPS Take 500 mg by mouth daily at 12 noon. (1200)   sertraline (ZOLOFT) 25 MG tablet Take 25 mg by mouth at bedtime. (2100)   Skin Protectants, Misc. (BAZA PROTECT EX) Apply 1 application topically in the morning and at bedtime. (0900 & 2100)   [DISCONTINUED] furosemide (LASIX) 40 MG tablet Take 1 tablet (40 mg total) by mouth daily.   [DISCONTINUED] gabapentin (NEURONTIN) 100 MG capsule Take 400 mg by mouth daily. 400MG  TOTAL DAILY     Allergies:    Milk protein   Social History: Social History   Socioeconomic History   Marital status: Widowed    Spouse name: Not on file   Number of children: 7   Years of education: Not on file   Highest education level: Not on file  Occupational History   Occupation: retired Technical brewer  Tobacco Use   Smoking status: Never   Smokeless tobacco: Never  Vaping Use   Vaping Use: Never used  Substance and Sexual Activity   Alcohol use: Never   Drug use: Never   Sexual activity: Not on file  Other Topics Concern   Not on file  Social History Narrative   Not on file   Social Determinants of Health   Financial Resource Strain: Not on file  Food Insecurity: Not on file  Transportation Needs: Not on file  Physical Activity: Not on file  Stress: Not on file  Social Connections: Not on file     Family History: The patient's family history includes Cancer in his father.  ROS:   All other ROS reviewed and negative. Pertinent positives noted in the HPI.     EKGs/Labs/Other Studies Reviewed:   The following studies were personally reviewed by me today:  TTE 07/23/2020  1. Left ventricular ejection fraction, by estimation, is 60 to 65%. The  left ventricle has normal function. The left ventricle  has no regional  wall motion abnormalities. There is mild left ventricular hypertrophy.  Left ventricular diastolic parameters  are indeterminate.   2. Right ventricular systolic function is mildly reduced. The right  ventricular size is mildly enlarged. Tricuspid regurgitation signal is  inadequate for assessing PA pressure.   3. There is a Mitra-Clip present in the A2-P2 position. Mild mitral  stenosis. MG 82mmHg. Mild to moderate mitral valve regurgitation. Eccentric  anterior directed MR jet. Difficult to quantify given eccentric jet, but  no LA/LV dilatation or PV systolic  reversal seen to suggest significant MR and visually appears improved from  prior echo 06/19/20   4. Tricuspid valve regurgitation is mild to moderate.   5. The  aortic valve is tricuspid. Aortic valve regurgitation is mild.  Mild to moderate aortic valve sclerosis/calcification is present, without  any evidence of aortic stenosis.   6. Aortic dilatation noted. There is mild dilatation of the ascending  aorta, measuring 38 mm.   7. The inferior vena cava is normal in size with greater than 50%  respiratory variability, suggesting right atrial pressure of 3 mmHg.   Recent Labs: 04/23/2020: TSH 4.190 05/21/2020: B Natriuretic Peptide 834.4 06/16/2020: ALT 22 06/19/2020: BUN 23; Creatinine, Ser 1.22; Hemoglobin 10.5; Platelets 222; Potassium 4.6; Sodium 133   Recent Lipid Panel No results found for: CHOL, TRIG, HDL, CHOLHDL, VLDL, LDLCALC, LDLDIRECT  Physical Exam:   VS:  BP (!) 84/59    Pulse 75    Ht 5\' 7"  (1.702 m)    Wt 152 lb 6.4 oz (69.1 kg)    SpO2 99%    BMI 23.87 kg/m    Wt Readings from Last 3 Encounters:  04/21/21 152 lb 6.4 oz (69.1 kg)  10/14/20 152 lb 3.2 oz (69 kg)  09/06/20 149 lb (67.6 kg)    General: Well nourished, well developed, in no acute distress Head: Atraumatic, normal size  Eyes: PEERLA, EOMI  Neck: Supple, no JVD Endocrine: No thryomegaly Cardiac: Normal S1, S2; irregular rhythm, 2  out of 6 holosystolic murmur Lungs: Clear to auscultation bilaterally, no wheezing, rhonchi or rales  Abd: Soft, nontender, no hepatomegaly  Ext: No edema, pulses 2+ Musculoskeletal: No deformities, BUE and BLE strength normal and equal Skin: Warm and dry, no rashes   Neuro: Alert and oriented to person, place, time, and situation, CNII-XII grossly intact, no focal deficits  Psych: Normal mood and affect   ASSESSMENT:   Adrian [name] is a 88 y.o. male who presents for the following: 1. S/P mitral valve clip implantation   2. Non-rheumatic mitral regurgitation   3. Chronic diastolic heart failure (HCC)   4. Persistent atrial fibrillation (HCC)   5. Acquired thrombophilia (HCC)     PLAN:   1. S/P mitral valve clip implantation 2. Non-rheumatic mitral regurgitation -Status post MitraClip.  Doing well.  Faint murmur on exam.  Wrist recent echo without significant MR.  He has done really well since his procedure.  He will continue aspirin 81 mg daily.  He understands he needs antibiotic prophylaxis.  3. Chronic diastolic heart failure (HCC) -No signs of congestive heart failure.  BNP a bit low.  We will back off the Lasix 40 mg every other day.  No symptoms.  Denies any infectious symptoms.  Suspect he is just dehydrated.  4. Persistent atrial fibrillation (HCC) 5. Acquired thrombophilia (HCC) -Long history of atrial fibrillation.  History of intracranial hemorrhage.  Not on anticoagulation due to this reason.  No symptoms from this.  Disposition: Return in about 1 year (around 04/21/2022).  Medication Adjustments/Labs and Tests Ordered: Current medicines are reviewed at length with the patient today.  Concerns regarding medicines are outlined above.  No orders of the defined types were placed in this encounter.  Meds ordered this encounter  Medications   furosemide (LASIX) 40 MG tablet    Sig: Take 1 tablet every other day    Dispense:  90 tablet    Refill:  3    Patient  Instructions  Medication Instructions:  Take Lasix every other day.   *If you need a refill on your cardiac medications before your next appointment, please call your pharmacy*  Follow-Up: At [facility], you and your  health needs are our priority.  As part of our continuing mission to provide you with exceptional heart care, we have created designated Provider Care Teams.  These Care Teams include your primary Cardiologist (physician) and Advanced Practice Providers (APPs -  Physician Assistants and Nurse Practitioners) who all work together to provide you with the care you need, when you need it.  We recommend signing up for the patient portal called "MyChart".  Sign up information is provided on this After Visit Summary.  MyChart is used to connect with patients for Virtual Visits (Telemedicine).  Patients are able to view lab/test results, encounter notes, upcoming appointments, etc.  Non-urgent messages can be sent to your provider as well.   To learn more about what you can do with MyChart, go to [email or web address].    Your next appointment:   12 month(s)  The format for your next appointment:   In Person  Provider:   Evalina Field, MD          Time Spent with Patient: I have spent a total of 35 minutes with patient reviewing hospital notes, telemetry, EKGs, labs and examining the patient as well as establishing an assessment and plan that was discussed with the patient.  > 50% of time was spent in direct patient care.  Signed, Addison Naegeli. Audie Box, MD, [place]  [address] [place], [place] [address] [phone]  04/21/2021 10:43 AM

## 2021-04-21 ENCOUNTER — Ambulatory Visit: Payer: Medicare HMO | Admitting: Cardiovascular Disease

## 2021-04-21 ENCOUNTER — Encounter: Payer: Self-pay | Admitting: Cardiovascular Disease

## 2021-04-21 ENCOUNTER — Other Ambulatory Visit: Payer: Self-pay

## 2021-04-21 VITALS — BP 84/59 | HR 75 | Ht 67.0 in | Wt 152.4 lb

## 2021-04-21 DIAGNOSIS — D6869 Other thrombophilia: Secondary | ICD-10-CM

## 2021-04-21 DIAGNOSIS — I340 Nonrheumatic mitral (valve) insufficiency: Secondary | ICD-10-CM

## 2021-04-21 DIAGNOSIS — Z9889 Other specified postprocedural states: Secondary | ICD-10-CM

## 2021-04-21 DIAGNOSIS — I4819 Other persistent atrial fibrillation: Secondary | ICD-10-CM | POA: Diagnosis not present

## 2021-04-21 DIAGNOSIS — I5032 Chronic diastolic (congestive) heart failure: Secondary | ICD-10-CM | POA: Diagnosis not present

## 2021-04-21 DIAGNOSIS — Z95818 Presence of other cardiac implants and grafts: Secondary | ICD-10-CM

## 2021-04-21 MED ORDER — FUROSEMIDE 40 MG PO TABS: ORAL_TABLET | ORAL | 3 refills | Status: DC

## 2021-04-21 NOTE — Patient Instructions (Signed)
Medication Instructions:  Take Lasix every other day.   *If you need a refill on your cardiac medications before your next appointment, please call your pharmacy*  Follow-Up: At [facility], you and your health needs are our priority.  As part of our continuing mission to provide you with exceptional heart care, we have created designated Provider Care Teams.  These Care Teams include your primary Cardiologist (physician) and Advanced Practice Providers (APPs -  Physician Assistants and Nurse Practitioners) who all work together to provide you with the care you need, when you need it.  We recommend signing up for the patient portal called "MyChart".  Sign up information is provided on this After Visit Summary.  MyChart is used to connect with patients for Virtual Visits (Telemedicine).  Patients are able to view lab/test results, encounter notes, upcoming appointments, etc.  Non-urgent messages can be sent to your provider as well.   To learn more about what you can do with MyChart, go to [email or web address].    Your next appointment:   12 month(s)  The format for your next appointment:   In Person  Provider:   Reatha Harps, MD

## 2021-06-08 NOTE — Progress Notes (Signed)
?[facility]   ?[place] ?                                    ?Cardiology Office Note:   ? ?Date:  06/10/2021  ? ?ID:  Adrian [name], DOB 1927/06/13, MRN [id number] ? ?PCP:  [facility]s, [facility]  ?[facility] Cardiologist:  Evalina Field, MD  ?[facility] Electrophysiologist:  None  ? ?Referring MD: [facility], [facility]*  ? ?Chief Complaint  ?Patient presents with  ? Follow-up  ?  1 month s/p TAVR   ? ?History of Present Illness:   ? ?Adrian [name] is a 88 y.o. male with a hx of  persistent atrial fibrillation not on anticoagulation due to history of intracranial hemorrhage while on Coumadin, obstructive sleep apnea on CPAP, GERD and severe MR s/p TEER (06/18/20) who presents to clinic for one year follow up. ?  ?Adrian [name] initially was having symptoms of acute dyspnea as well as somnolence, weakness, and fatigue for about 2 months prior to evaluation. Echo showed newly diagnosed severe MR and chest x-ray demonstrated bilateral pulmonary infiltrates suggestive of pneumonia which in retrospect were likely related to acute CHF. He was hospitalized in 05/2020 and diuresed. TEE during admission showed prolapse of the posterior leaflet of the mitral valve with suspected partial flail segment. He became severely hypotensive during TEE and the degree of MR may have been underestimated because of hemodynamic state. R/LHC showed mild disease in the mid LAD with severe 80% stenosis of distal LAD just before the vessel wraps around the apex. Medical therapy was recommendation for CAD. Patient was referred to Structural Heart Team and seen by Dr. Burt Knack as outpatient on 05/26/2020. At that time, he reported continued shortness of breath with low levels of activity since discharge but denied any orthopnea or PND. Decision was made to proceed with MitraClip.  ?  ?He was evaluated by the multidisciplinary valve team and underwent successful transcatheter edge-to-edge mitral  valve repair using a single MitraClip G4 XTW device positioned on the medial aspect of A2/P2 with reduction in mitral regurgitation from 3+ at baseline to 2+ post procedure. There was significant V wave reduction following MitraClip deployment. Pulmonary vein flow was initially blunted but was forward flow post clip deployment. POD 1 echo showed mild to moderate residual MR. He was discharged home on aspirin and Plavix x 3 months.  ?  ?He was seen several times post TEER and seemed to be doing well. He did have some adjustments with his Lasix. On most recent follow up 04/21/21 he was doing well. BP was a little low but he was asymptomatic. He was continued on ASA and SBE was discussed. His Lasix was reduced to every other day dosing.  ? ?Today he presents with his son. He continues to do extremely well after MitraClip. He lives at an assisted living facility which offers lots of activities. He spends his days doing multiple exercise classes. His biggest complaint is a neck spur which is causing him lots of discomfort. He sees an orthopedist for this, as well as PT. His BP is improved. He denies chest pain, SOB, LE edema, palpitations, orthopnea, dizziness, or syncope.  ? ?Past Medical History:  ?Diagnosis Date  ? GERD (gastroesophageal reflux disease)   ? History of intracranial hemorrhage   ? OSA (obstructive sleep apnea)   ? Persistent atrial fibrillation ([place])   ?  not on oral anticoagulation due to hx of ICH  ? S/P mitral valve clip implantation 06/18/2020  ? s/p TEER using a single MitraClip G4 XTW device positioned on the medial aspect of A2/P2 by Dr. Burt Knack  ? Severe mitral regurgitation   ? ? ?Past Surgical History:  ?Procedure Laterality Date  ? CRANIOTOMY    ? MITRAL VALVE REPAIR N/A 06/18/2020  ? Procedure: MITRAL VALVE REPAIR;  Surgeon: Sherren Mocha, MD;  Location: [place] [facility];  Service: Cardiovascular;  Laterality: N/A;  ? RIGHT/LEFT HEART CATH AND CORONARY ANGIOGRAPHY N/A 06/03/2020  ? Procedure:  RIGHT/LEFT HEART CATH AND CORONARY ANGIOGRAPHY;  Surgeon: Burnell Blanks, MD;  Location: [place] [facility];  Service: Cardiovascular;  Laterality: N/A;  ? SHOULDER SURGERY    ? TEE WITHOUT CARDIOVERSION N/A 05/22/2020  ? Procedure: TRANSESOPHAGEAL ECHOCARDIOGRAM (TEE);  Surgeon: Buford Dresser, MD;  Location: [place];  Service: Cardiovascular;  Laterality: N/A;  ? TEE WITHOUT CARDIOVERSION N/A 06/18/2020  ? Procedure: TRANSESOPHAGEAL ECHOCARDIOGRAM (TEE);  Surgeon: Sherren Mocha, MD;  Location: [place] [facility];  Service: Cardiovascular;  Laterality: N/A;  ? ? ?Current Medications: ?Current Meds  ?Medication Sig  ? acetaminophen (TYLENOL) 500 MG tablet Take 500 mg by mouth in the morning, at noon, and at bedtime. (0900, 1300 & 2100)  ? amoxicillin (AMOXIL) 500 MG tablet Take 2,000 mg (4 tablets) 1 hour prior to all dental visits.  ? aspirin 81 MG EC tablet Take 81 mg by mouth daily. (0900)  ? Calcium Carb-Cholecalciferol (CALCIUM 500 + D3) 500-200 MG-UNIT TABS Take 1 tablet by mouth daily. (0900)  ? diclofenac Sodium (VOLTAREN) 1 % GEL Apply 2 g topically in the morning and at bedtime. (0900 & 2100)  ? fluticasone (FLONASE) 50 MCG/ACT nasal spray Place 1 spray into both nostrils in the morning and at bedtime. (0900 & 1700)  ? furosemide (LASIX) 40 MG tablet Take 1 tablet every other day  ? gabapentin (NEURONTIN) 300 MG capsule Take 300 mg by mouth in the morning and at bedtime.  ? guaiFENesin (MUCINEX) 600 MG 12 hr tablet Take 600 mg by mouth 2 (two) times daily. (0900 & 1700)  ? montelukast (SINGULAIR) 10 MG tablet Take 10 mg by mouth at bedtime.  ? Multiple Vitamin (MULTIVITAMIN WITH MINERALS) TABS tablet Take 1 tablet by mouth daily. (0900)  ? Multiple Vitamins-Minerals (PRESERVISION AREDS 2 PO) Take 1 tablet by mouth in the morning and at bedtime. (0900 & 1700)  ? nitroGLYCERIN (NITROSTAT) 0.4 MG SL tablet Place 1 tablet (0.4 mg total) under the tongue every 5 (five) minutes x 3 doses as  needed for chest pain.  ? pantoprazole (PROTONIX) 40 MG tablet Take 1 tablet (40 mg total) by mouth daily.  ? rivastigmine (EXELON) 3 MG capsule Take 3 mg by mouth 2 (two) times daily. (0900 & 1700)  ? Saw Palmetto 500 MG CAPS Take 500 mg by mouth daily at 12 noon. (1200)  ? sertraline (ZOLOFT) 25 MG tablet Take 25 mg by mouth at bedtime. (2100)  ? Skin Protectants, Misc. (BAZA PROTECT EX) Apply 1 application topically in the morning and at bedtime. (0900 & 2100)  ?  ? ?Allergies:   Milk protein  ? ?Social History  ? ?Socioeconomic History  ? Marital status: Widowed  ?  Spouse name: Not on file  ? Number of children: 7  ? Years of education: Not on file  ? Highest education level: Not on file  ?Occupational History  ? Occupation:  retired Technical brewer  ?Tobacco Use  ? Smoking status: Never  ? Smokeless tobacco: Never  ?Vaping Use  ? Vaping Use: Never used  ?Substance and Sexual Activity  ? Alcohol use: Never  ? Drug use: Never  ? Sexual activity: Not on file  ?Other Topics Concern  ? Not on file  ?Social History Narrative  ? Not on file  ? ?Social Determinants of Health  ? ?Financial Resource Strain: Not on file  ?Food Insecurity: Not on file  ?Transportation Needs: Not on file  ?Physical Activity: Not on file  ?Stress: Not on file  ?Social Connections: Not on file  ?  ?Family History: ?The patient's family history includes Cancer in his father. ? ?ROS:   ?Please see the history of present illness.    ?All other systems reviewed and are negative. ? ?EKGs/Labs/Other Studies Reviewed:   ? ?The following studies were reviewed today: ? ?Echocardiogram 06/09/21: ? ? 1. Left ventricular ejection fraction, by estimation, is 60 to 65%. The  ?left ventricle has normal function. The left ventricle has no regional  ?wall motion abnormalities. There is mild concentric left ventricular  ?hypertrophy. Left ventricular diastolic  ?parameters are indeterminate.  ? 2. Right ventricular systolic function is normal. The right ventricular   ?size is normal. There is normal pulmonary artery systolic pressure.  ? 3. Left atrial size was mildly dilated.  ? 4. Mitralclip appears to be well-positioned. Mild mitral valve  ?regurgitation. No ev

## 2021-06-09 ENCOUNTER — Ambulatory Visit: Payer: Medicare HMO | Admitting: Cardiology

## 2021-06-09 ENCOUNTER — Ambulatory Visit (HOSPITAL_COMMUNITY): Payer: Medicare HMO | Attending: Cardiology

## 2021-06-09 VITALS — BP 132/68 | HR 62 | Ht 67.0 in | Wt 155.8 lb

## 2021-06-09 DIAGNOSIS — I5032 Chronic diastolic (congestive) heart failure: Secondary | ICD-10-CM

## 2021-06-09 DIAGNOSIS — I2510 Atherosclerotic heart disease of native coronary artery without angina pectoris: Secondary | ICD-10-CM | POA: Diagnosis not present

## 2021-06-09 DIAGNOSIS — Z95818 Presence of other cardiac implants and grafts: Secondary | ICD-10-CM

## 2021-06-09 DIAGNOSIS — Z954 Presence of other heart-valve replacement: Secondary | ICD-10-CM | POA: Diagnosis not present

## 2021-06-09 DIAGNOSIS — Z9889 Other specified postprocedural states: Secondary | ICD-10-CM | POA: Diagnosis not present

## 2021-06-09 DIAGNOSIS — I340 Nonrheumatic mitral (valve) insufficiency: Secondary | ICD-10-CM | POA: Diagnosis not present

## 2021-06-09 DIAGNOSIS — I10 Essential (primary) hypertension: Secondary | ICD-10-CM

## 2021-06-09 LAB — ECHOCARDIOGRAM COMPLETE
Area-P 1/2: 3.75 cm2
MV M vel: 4.73 m/s
MV Peak grad: 89.5 mmHg
MV VTI: 1.33 cm2
P 1/2 time: 827 msec
Radius: 0.4 cm
S' Lateral: 2.7 cm

## 2021-06-09 NOTE — Patient Instructions (Signed)
Medication Instructions:  ?Your physician recommends that you continue on your current medications as directed. Please refer to the Current Medication list given to you today. ? ?*If you need a refill on your cardiac medications before your next appointment, please call your pharmacy* ? ? ?Lab Work: ?None ordered  ? ?If you have labs (blood work) drawn today and your tests are completely normal, you will receive your results only by: ?MyChart Message (if you have MyChart) OR ?A paper copy in the mail ?If you have any lab test that is abnormal or we need to change your treatment, we will call you to review the results. ? ? ?Testing/Procedures: ?None ordered  ? ? ?Follow-Up: ?Follow up with Dr. Laban Emperor in February 2024  ? ?Other Instructions ?None   ?

## 2021-07-20 ENCOUNTER — Other Ambulatory Visit: Payer: Self-pay | Admitting: Physician Assistant

## 2021-08-07 IMAGING — CR DG CHEST 2V
2 series · 2 of 2 positions shown · non-contrast
Comparison: None.

CLINICAL DATA: Shortness of breath and fatigue

EXAM:
CHEST - 2 VIEW

[w chest lat]
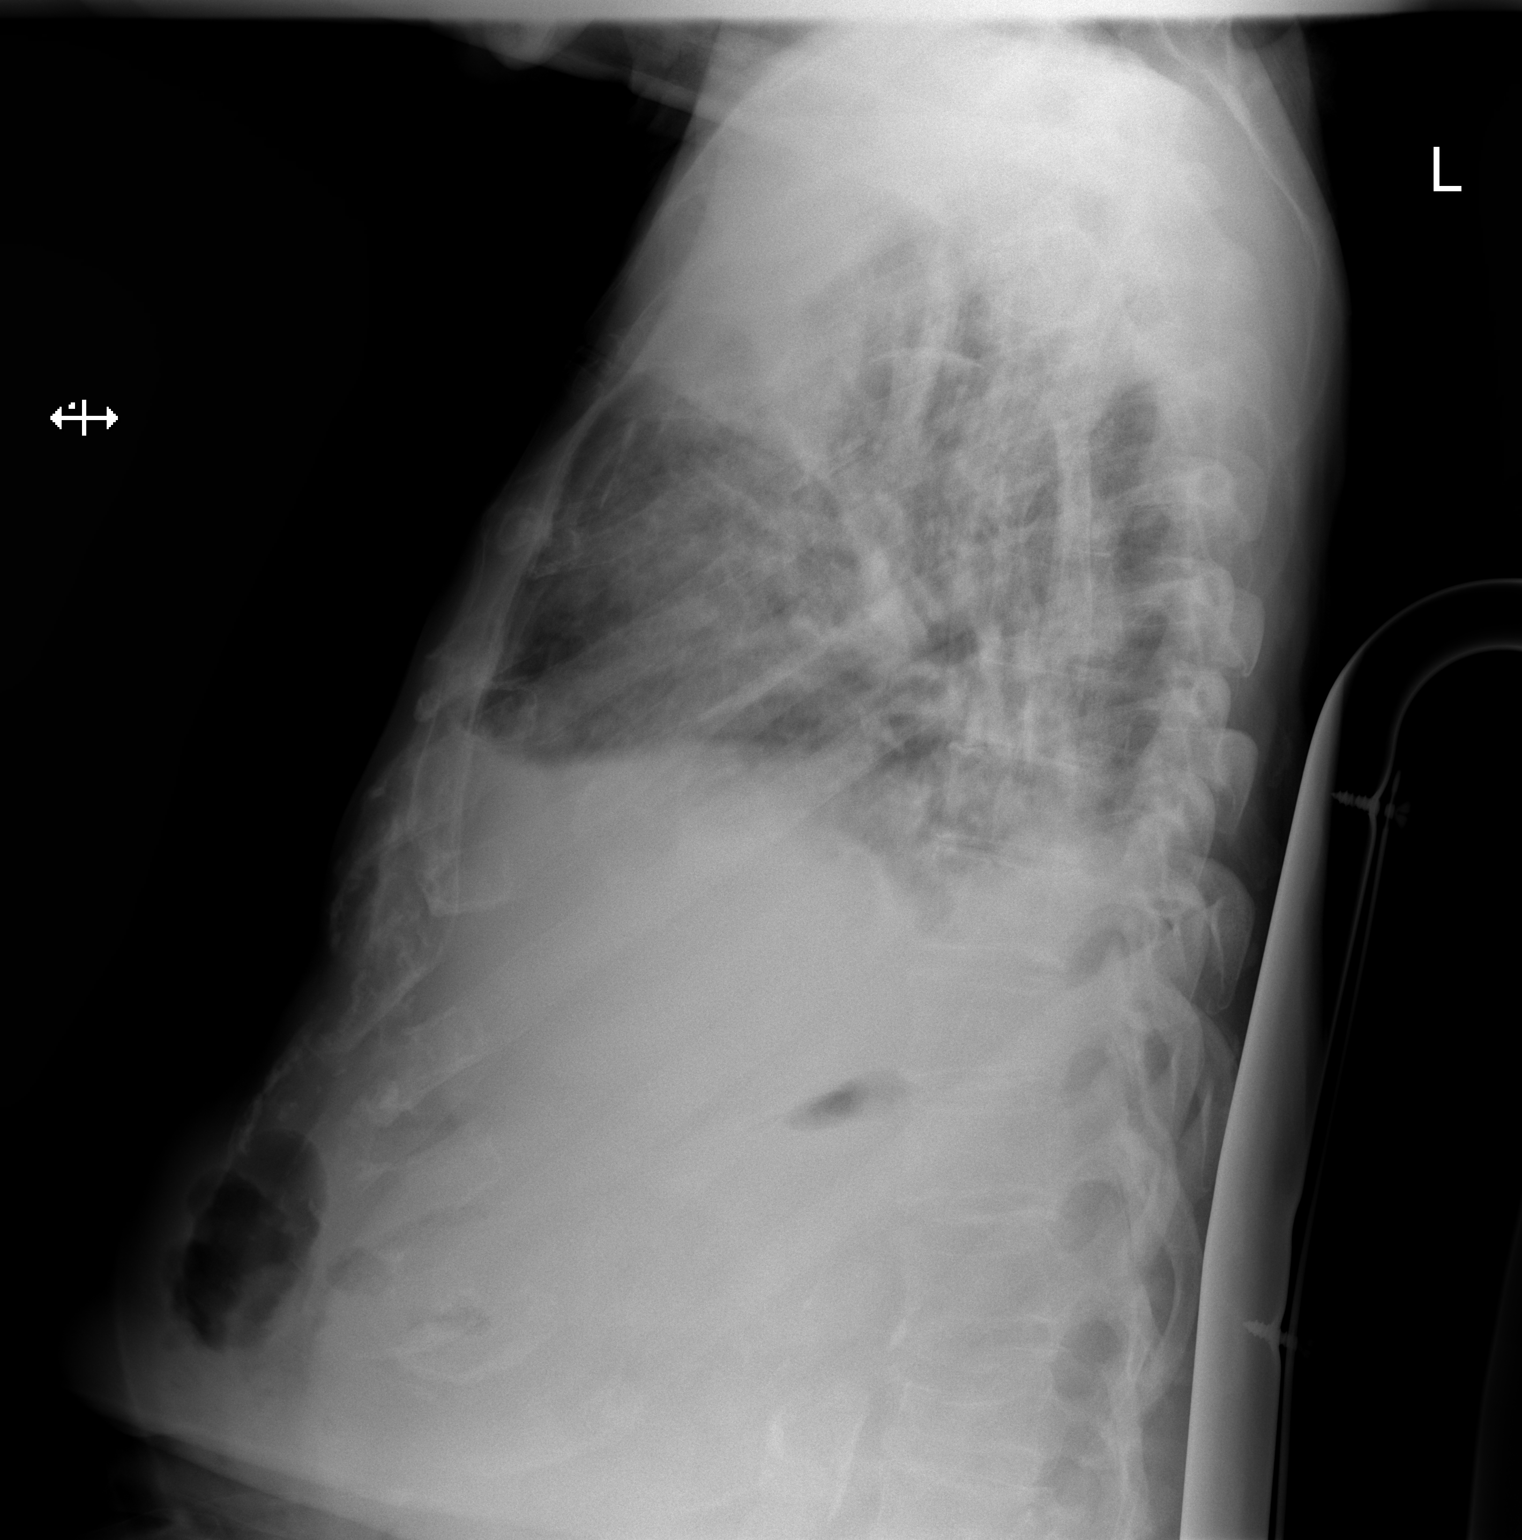

[x chest ap]
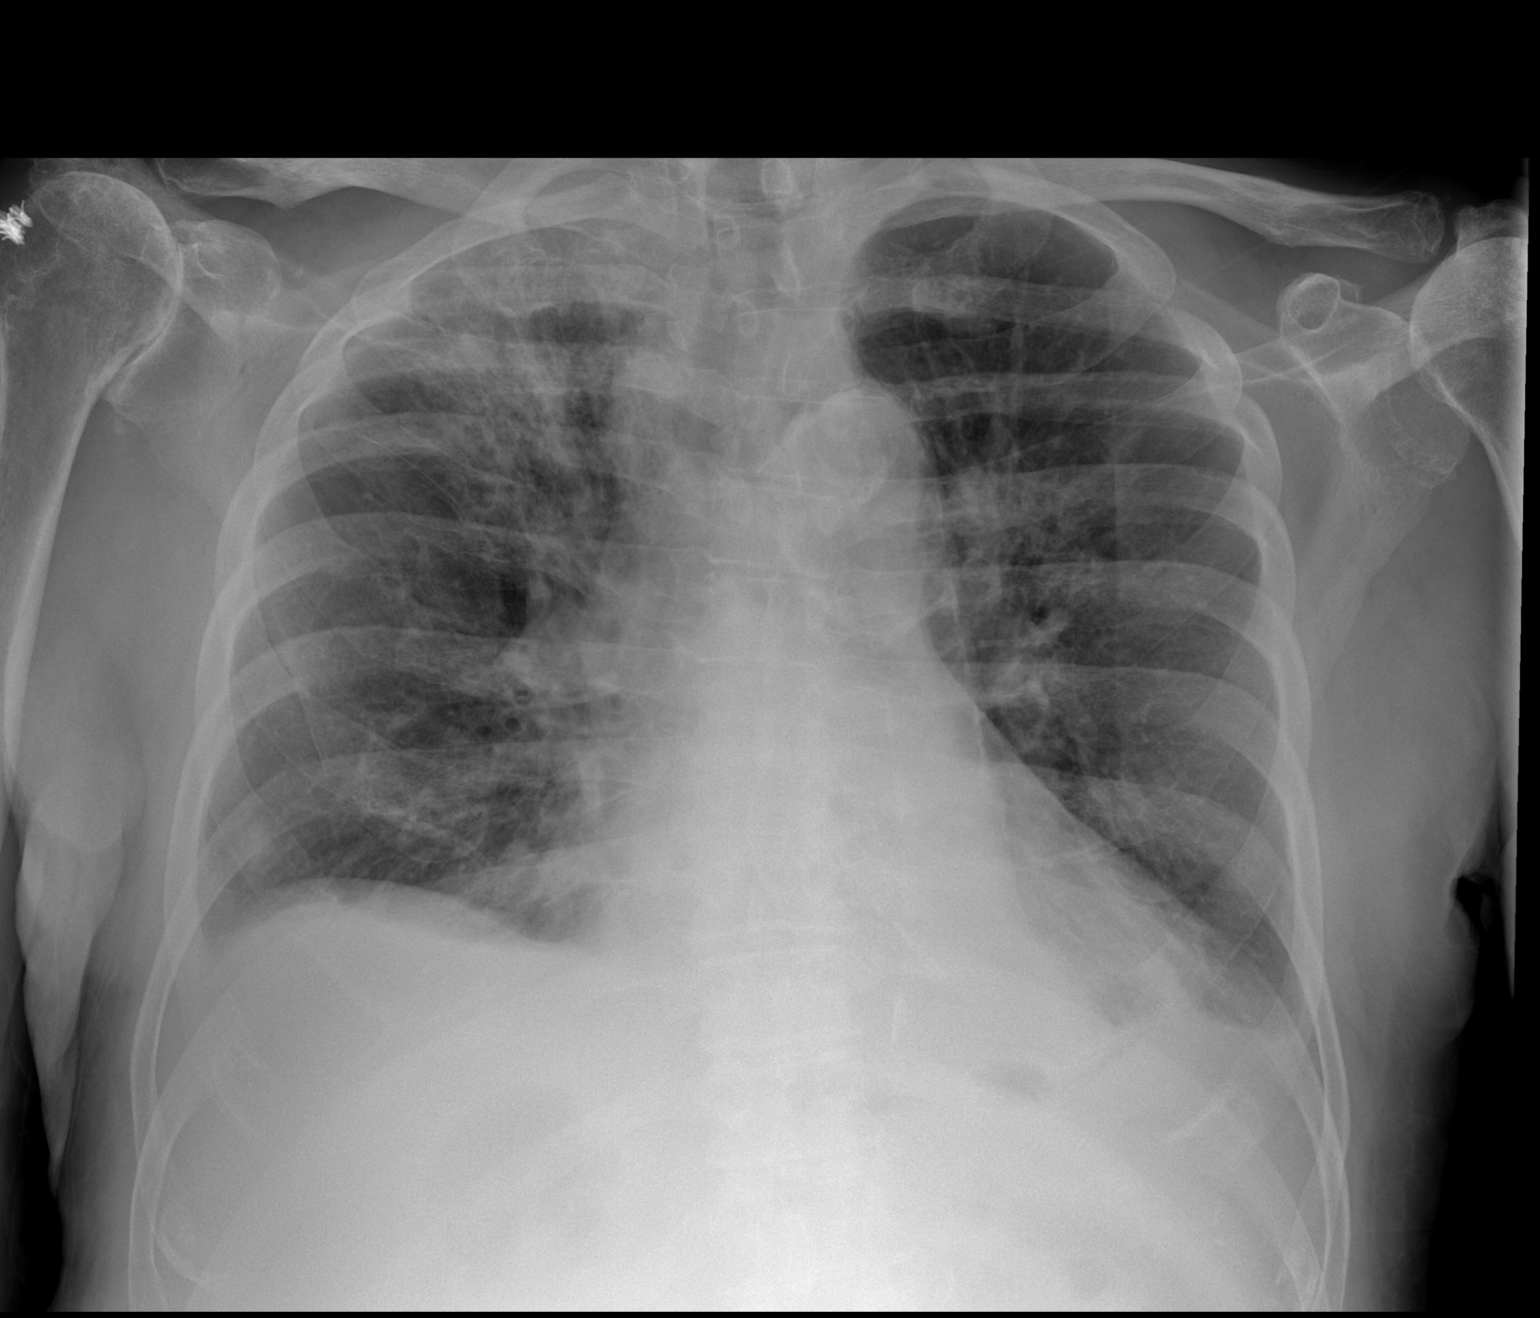

[2 of 2 positions shown; findings below may reference images not displayed]

FINDINGS: There is airspace opacity in the anterior segment right upper lobe
consistent with pneumonia. There is consolidation in the
posteromedial left base consistent with a second focus of pneumonia.
The lungs elsewhere are clear. Heart is upper normal in size with
pulmonary vascularity within normal limits. No adenopathy. There is
aortic atherosclerosis. Postoperative change noted in the right
shoulder. There is degenerative change in each shoulder with
superior migration of each humeral head.
IMPRESSION: Areas of apparent pneumonia in the anterior segment right upper lobe
and in the posteromedial left base region. Heart size within normal
limits. No adenopathy. Aortic Atherosclerosis ([id number]). Suspect
chronic rotator cuff tears bilaterally.

These results will be called to the ordering clinician or
representative by the Radiologist Assistant, and communication
documented in the PACS or [REDACTED].

## 2022-02-06 ENCOUNTER — Emergency Department (HOSPITAL_COMMUNITY)
Admission: EM | Admit: 2022-02-06 | Discharge: 2022-02-06 | Disposition: A | Discharge status: Home / Self Care | Payer: Medicare HMO | Attending: Emergency Medicine | Admitting: Emergency Medicine

## 2022-02-06 ENCOUNTER — Encounter (HOSPITAL_COMMUNITY): Payer: Self-pay

## 2022-02-06 ENCOUNTER — Other Ambulatory Visit: Payer: Self-pay

## 2022-02-06 ENCOUNTER — Emergency Department (HOSPITAL_COMMUNITY): Payer: Medicare HMO

## 2022-02-06 DIAGNOSIS — Z23 Encounter for immunization: Secondary | ICD-10-CM | POA: Diagnosis not present

## 2022-02-06 DIAGNOSIS — W1830XA Fall on same level, unspecified, initial encounter: Secondary | ICD-10-CM | POA: Diagnosis not present

## 2022-02-06 DIAGNOSIS — Z7982 Long term (current) use of aspirin: Secondary | ICD-10-CM | POA: Insufficient documentation

## 2022-02-06 DIAGNOSIS — S0990XA Unspecified injury of head, initial encounter: Secondary | ICD-10-CM | POA: Diagnosis present

## 2022-02-06 DIAGNOSIS — S0101XA Laceration without foreign body of scalp, initial encounter: Secondary | ICD-10-CM | POA: Diagnosis not present

## 2022-02-06 DIAGNOSIS — I5032 Chronic diastolic (congestive) heart failure: Secondary | ICD-10-CM | POA: Insufficient documentation

## 2022-02-06 MED ORDER — LIDOCAINE-EPINEPHRINE 1 %-1:100000 IJ SOLN
20.0000 mL | Freq: Once | INTRAMUSCULAR | Status: AC
  Administered 2022-02-06: 20 mL via INTRADERMAL
  Filled 2022-02-06: qty 1

## 2022-02-06 MED ORDER — ACETAMINOPHEN 500 MG PO TABS
1000.0000 mg | ORAL_TABLET | Freq: Once | ORAL | Status: AC
  Administered 2022-02-06: 1000 mg via ORAL
  Filled 2022-02-06: qty 2

## 2022-02-06 MED ORDER — TETANUS-DIPHTH-ACELL PERTUSSIS 5-2.5-18.5 LF-MCG/0.5 IM SUSY
0.5000 mL | PREFILLED_SYRINGE | Freq: Once | INTRAMUSCULAR | Status: AC
  Administered 2022-02-06: 0.5 mL via INTRAMUSCULAR
  Filled 2022-02-06: qty 0.5

## 2022-02-06 NOTE — ED Notes (Signed)
Patient transported to CT 

## 2022-02-06 NOTE — ED Triage Notes (Addendum)
Patient is coming from the [facility] assisted living facility with [facility]. He had a mechanical fall and hit his head on the floor. He denies LOC and pain. He is not on blood thinners. He has a laceration on the back of the left side of his head. [facility] states it took 6 minutes of pressure to stop the bleeding. He is A&Ox4. Patient has a history of a subdural bleed.

## 2022-02-06 NOTE — Discharge Instructions (Addendum)
We evaluated you in the emergency department after your fall.  Your head CT was negative.  We repaired your scalp laceration with staples.  Please get these removed in 10 to 14 days with your primary doctor.  Please keep a close eye on your wound.  You develop any redness, warmth, drainage of pus, please return to get a repeat evaluation.  Please also return if you develop any new pain, headaches, vomiting, fevers, or any other new symptoms that were not present when you initially were in the emergency department.

## 2022-02-06 NOTE — ED Provider Notes (Signed)
[facility] EMERGENCY DEPARTMENT Provider Note  CSN: [id number] Arrival date & time: 02/06/22 1457  Chief Complaint(s) Fall  HPI Adrian [name] is a 88 y.o. male with history of A-fib not on anticoagulation, MR status post MitraClip presenting to the emergency department after fall.  Patient reports that he was turning around and tripped, landing on the back of his head on the left side.  He reports he overall feels well and denies significant pain anywhere in his body including his extremities, neck, back, chest, abdomen.  He denies any headaches, nausea, vomiting.  Did not lose consciousness.  Symptoms are mild.   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 Authorizing Provider  acetaminophen (TYLENOL) 500 MG tablet Take 500 mg by mouth in the morning, at noon, and at bedtime. (0900, 1300 & 2100)    [provider]  amoxicillin (AMOXIL) 500 MG tablet Take 2,000 mg (4 tablets) 1 hour prior to all dental visits. 07/23/20   Janetta Hora, PA-C  aspirin 81 MG EC tablet Take 81 mg by mouth daily. (0900)     [provider]  Calcium Carb-Cholecalciferol (CALCIUM 500 + D3) 500-200 MG-UNIT TABS Take 1 tablet by mouth daily. (0900)    [provider]  diclofenac Sodium (VOLTAREN) 1 % GEL Apply 2 g topically in the morning and at bedtime. (0900 & 2100)    [provider]  fluticasone (FLONASE) 50 MCG/ACT nasal spray Place 1 spray into both nostrils in the morning and at bedtime. (0900 & 1700)    [provider]  furosemide (LASIX) 40 MG tablet Take 1 tablet every other day 04/21/21   Sande Rives, MD  gabapentin (NEURONTIN) 300 MG capsule Take 300 mg by mouth in the morning and at bedtime. 04/12/21   [provider]  guaiFENesin (MUCINEX) 600 MG 12 hr tablet Take 600 mg by mouth 2 (two) times daily. (0900 & 1700)    [provider]  metoprolol tartrate (LOPRESSOR) 25 MG tablet Take 25 mg by mouth daily. 05/19/21   [provider]  montelukast (SINGULAIR) 10 MG tablet Take 10 mg by mouth at bedtime. 12/02/19   [provider]  Multiple Vitamin (MULTIVITAMIN WITH MINERALS) TABS tablet Take 1 tablet by mouth daily. (0900)    [provider]  Multiple Vitamins-Minerals (PRESERVISION AREDS 2 PO) Take 1 tablet by mouth in the morning and at bedtime. (0900 & 1700)    [provider]  nitroGLYCERIN (NITROSTAT) 0.4 MG SL tablet Place 1 tablet (0.4 mg total) under the tongue every 5 (five) minutes x 3 doses  as needed for chest pain. 05/24/20   [facility], Roe Rutherford, PA  pantoprazole (PROTONIX) 40 MG tablet TAKE 1 TABLET BY MOUTH DAILY. --DX: GERD 07/20/21   Janetta Hora, PA-C  rivastigmine (EXELON) 3 MG capsule Take 3 mg by mouth 2 (two) times daily. (0900 & 1700) 04/11/20   [provider]  Saw Palmetto 500 MG CAPS Take 500 mg by mouth daily at 12 noon. (1200)    [provider]  sertraline (ZOLOFT) 25 MG tablet Take 25 mg by mouth at bedtime. (2100) 05/25/20   [provider]  Skin Protectants,  Misc. (BAZA PROTECT EX) Apply 1 application topically in the morning and at bedtime. (0900 & 2100)    [provider]                                                                                                                                    Past Surgical History Past Surgical History:  Procedure Laterality Date   CRANIOTOMY     MITRAL VALVE REPAIR N/A 06/18/2020   Procedure: MITRAL VALVE REPAIR;  Surgeon: Tonny Bollman, MD;  Location: [facility] INVASIVE CV LAB;  Service: Cardiovascular;  Laterality: N/A;   RIGHT/LEFT HEART CATH AND CORONARY ANGIOGRAPHY N/A 06/03/2020   Procedure: RIGHT/LEFT HEART CATH AND CORONARY ANGIOGRAPHY;  Surgeon: Kathleene Hazel, MD;  Location: [facility] INVASIVE CV LAB;  Service: Cardiovascular;  Laterality: N/A;   SHOULDER SURGERY     TEE WITHOUT CARDIOVERSION N/A 05/22/2020   Procedure: TRANSESOPHAGEAL ECHOCARDIOGRAM (TEE);  Surgeon: Jodelle Red, MD;  Location: [facility] ENDOSCOPY;  Service: Cardiovascular;  Laterality: N/A;   TEE WITHOUT CARDIOVERSION N/A 06/18/2020   Procedure: TRANSESOPHAGEAL ECHOCARDIOGRAM (TEE);  Surgeon: Tonny Bollman, MD;  Location: [facility] INVASIVE CV LAB;  Service: Cardiovascular;  Laterality: N/A;   Family History Family History  Problem Relation Age of Onset   Cancer Father     Social History Social History   Tobacco Use   Smoking status: Never   Smokeless tobacco: Never  Vaping Use   Vaping Use: Never used  Substance Use Topics   Alcohol use: Never   Drug use: Never   Allergies Milk protein  Review of Systems Review of Systems  All other systems reviewed and are negative.   Physical Exam Vital Signs  I have reviewed the triage vital signs BP 115/75   Pulse 62   Temp 98.3 F (36.8 C) (Oral)   Resp 19   Ht  (1.702 m)   Wt 70 kg   SpO2 100%   BMI 24.17 kg/m  Physical Exam Vitals and nursing note reviewed.  Constitutional:      General: He is not in acute distress.    Appearance: Normal  appearance.  HENT:     Head:     Comments: On the left posterior scalp there is an approximately 5 cm laceration with dried blood.    Mouth/Throat:  Mouth: Mucous membranes are moist.  Eyes:     Conjunctiva/sclera: Conjunctivae normal.  Cardiovascular:     Rate and Rhythm: Normal rate and regular rhythm.  Pulmonary:     Effort: Pulmonary effort is normal. No respiratory distress.     Breath sounds: Normal breath sounds.  Abdominal:     General: Abdomen is flat.     Palpations: Abdomen is soft.     Tenderness: There is no abdominal tenderness.  Musculoskeletal:     Right lower leg: No edema.     Left lower leg: No edema.     Comments: No midline C, T, L-spine tenderness.  No chest wall tenderness or crepitus.  Full painless range of motion at the bilateral upper extremities including the shoulders, elbows, wrists, hand and fingers, and in the bilateral lower extremities including the hips, knees, ankle, toes.  No focal bony tenderness, injury or deformity.   Skin:    General: Skin is warm and dry.     Capillary Refill: Capillary refill takes less than 2 seconds.  Neurological:     Mental Status: He is alert and oriented to person, place, and time. Mental status is at baseline.  Psychiatric:        Mood and Affect: Mood normal.        Behavior: Behavior normal.     ED Results and Treatments Labs (all labs ordered are listed, but only abnormal results are displayed) Labs Reviewed - No data to display                                                                                                                        Radiology CT Head Wo Contrast  Result Date: 02/06/2022 CLINICAL DATA:  Head trauma, minor (Age >= 65y); Neck trauma (Age >= 65y) EXAM: CT HEAD WITHOUT CONTRAST CT CERVICAL SPINE WITHOUT CONTRAST TECHNIQUE: Multidetector CT imaging of the head and cervical spine was performed following the standard protocol without intravenous contrast. Multiplanar CT image  reconstructions of the cervical spine were also generated. RADIATION DOSE REDUCTION: This exam was performed according to the departmental dose-optimization program which includes automated exposure control, adjustment of the mA and/or kV according to patient size and/or use of iterative reconstruction technique. COMPARISON:  MRI cervical spine 03/27/2021, CT head 04/22/2020 FINDINGS: CT HEAD FINDINGS BRAIN: BRAIN Cerebral ventricle sizes are concordant with the degree of cerebral volume loss. Patchy and confluent areas of decreased attenuation are noted throughout the deep and periventricular white matter of the cerebral hemispheres bilaterally, compatible with chronic microvascular ischemic disease. No evidence of large-territorial acute infarction. No parenchymal hemorrhage. No mass lesion. No extra-axial collection. No mass effect or midline shift. No hydrocephalus. Basilar cisterns are patent. Vascular: No hyperdense vessel. Skull: No acute fracture or focal lesion. Sinuses/Orbits: Paranasal sinuses and mastoid air cells are clear. Bilateral lens replacement. The orbits are unremarkable. Other: Left parietooccipital scalp soft tissue defect with trace hematoma formation. No retained radiopaque foreign body. CT CERVICAL SPINE FINDINGS  Alignment: Stable grade 1 anterolisthesis of C3 on C4, C4 on C5. Stable mild retrolisthesis of C5 on C6 and C6 on C7. Stable grade 1 anterolisthesis of C7 on T1. Skull base and vertebrae: Old nonunionized anterior and posterior C1 arch fracture versus less likely congenital defect. Multilevel moderate severe degenerative changes of the spine. Severe intervertebral disc space narrowing at the C4-C5, C5-C6, C6-C7 levels. No associated severe osseous neural foraminal or central canal stenosis. No acute fracture. No aggressive appearing focal osseous lesion or focal pathologic process. Soft tissues and spinal canal: No prevertebral fluid or swelling. No visible canal hematoma. Upper  chest: Unremarkable. Other: None. IMPRESSION: 1. No acute intracranial abnormality. 2. No acute displaced fracture or traumatic listhesis of the cervical spine. 3. Multilevel moderate severe degenerative changes spine with associated chronic spondylolisthesis. Electronically Signed   By: Tish Frederickson M.D.   On: 02/06/2022 17:10   CT Cervical Spine Wo Contrast  Result Date: 02/06/2022 CLINICAL DATA:  Head trauma, minor (Age >= 65y); Neck trauma (Age >= 65y) EXAM: CT HEAD WITHOUT CONTRAST CT CERVICAL SPINE WITHOUT CONTRAST TECHNIQUE: Multidetector CT imaging of the head and cervical spine was performed following the standard protocol without intravenous contrast. Multiplanar CT image reconstructions of the cervical spine were also generated. RADIATION DOSE REDUCTION: This exam was performed according to the departmental dose-optimization program which includes automated exposure control, adjustment of the mA and/or kV according to patient size and/or use of iterative reconstruction technique. COMPARISON:  MRI cervical spine 03/27/2021, CT head 04/22/2020 FINDINGS: CT HEAD FINDINGS BRAIN: BRAIN Cerebral ventricle sizes are concordant with the degree of cerebral volume loss. Patchy and confluent areas of decreased attenuation are noted throughout the deep and periventricular white matter of the cerebral hemispheres bilaterally, compatible with chronic microvascular ischemic disease. No evidence of large-territorial acute infarction. No parenchymal hemorrhage. No mass lesion. No extra-axial collection. No mass effect or midline shift. No hydrocephalus. Basilar cisterns are patent. Vascular: No hyperdense vessel. Skull: No acute fracture or focal lesion. Sinuses/Orbits: Paranasal sinuses and mastoid air cells are clear. Bilateral lens replacement. The orbits are unremarkable. Other: Left parietooccipital scalp soft tissue defect with trace hematoma formation. No retained radiopaque foreign body. CT CERVICAL SPINE  FINDINGS Alignment: Stable grade 1 anterolisthesis of C3 on C4, C4 on C5. Stable mild retrolisthesis of C5 on C6 and C6 on C7. Stable grade 1 anterolisthesis of C7 on T1. Skull base and vertebrae: Old nonunionized anterior and posterior C1 arch fracture versus less likely congenital defect. Multilevel moderate severe degenerative changes of the spine. Severe intervertebral disc space narrowing at the C4-C5, C5-C6, C6-C7 levels. No associated severe osseous neural foraminal or central canal stenosis. No acute fracture. No aggressive appearing focal osseous lesion or focal pathologic process. Soft tissues and spinal canal: No prevertebral fluid or swelling. No visible canal hematoma. Upper chest: Unremarkable. Other: None. IMPRESSION: 1. No acute intracranial abnormality. 2. No acute displaced fracture or traumatic listhesis of the cervical spine. 3. Multilevel moderate severe degenerative changes spine with associated chronic spondylolisthesis. Electronically Signed   By: Tish Frederickson M.D.   On: 02/06/2022 17:10    Pertinent labs & imaging results that were available during my care of the patient were reviewed by me and considered in my medical decision making (see MDM for details).  Medications Ordered in ED Medications  Tdap (BOOSTRIX) injection 0.5 mL (0.5 mLs Intramuscular Given 02/06/22 1550)  lidocaine-EPINEPHrine (XYLOCAINE W/EPI) 1 %-1:100000 (with pres) injection 20 mL (20 mLs Intradermal Given 02/06/22 1749)  acetaminophen (TYLENOL) tablet 1,000 mg (1,000 mg Oral Given 02/06/22 1547)                                                                                                                                     Procedures .1-3 Lead EKG Interpretation  Performed by: Lonell Grandchild, MD Authorized by: Lonell Grandchild, MD     Interpretation: abnormal     ECG rate:  86   ECG rate assessment: normal     Rhythm: atrial fibrillation     Ectopy: none     Conduction: normal    ..Laceration Repair  Date/Time: 02/06/2022 6:10 PM  Performed by: Lonell Grandchild, MD Authorized by: Lonell Grandchild, MD   Consent:    Consent obtained:  Verbal   Consent given by:  Patient   Risks, benefits, and alternatives were discussed: yes     Risks discussed:  Pain, retained foreign body, poor cosmetic result, infection, need for additional repair, nerve damage, poor wound healing, tendon damage and vascular damage   Alternatives discussed:  No treatment Universal protocol:    Procedure explained and questions answered to patient or proxy's satisfaction: yes     Patient identity confirmed:  Verbally with patient and arm band Anesthesia:    Anesthesia method:  Local infiltration   Local anesthetic:  Lidocaine 1% WITH epi Laceration details:    Location:  Scalp   Scalp location:  L parietal   Length (cm):  5 Exploration:    Limited defect created (wound extended): no     Hemostasis achieved with:  Direct pressure   Wound exploration: wound explored through full range of motion     Wound extent: fascia not violated, no foreign body and no underlying fracture   Treatment:    Area cleansed with:  Saline   Amount of cleaning:  Standard   Irrigation solution:  Sterile saline   Irrigation method:  Pressure wash   Visualized foreign bodies/material removed: no     Debridement:  None   Undermining:  None   Scar revision: no   Skin repair:    Repair method:  Staples   Number of staples:  6 Approximation:    Approximation:  Close Repair type:    Repair type:  Simple Post-procedure details:    Dressing:  Open (no dressing)   Procedure completion:  Tolerated well, no immediate complications   (including critical care time)  Medical Decision Making / ED Course   MDM:  88 year old male presenting to the emergency department after fall.  Patient overall well-appearing, A&O x 4.  Exam notable for large laceration to left posterior scalp without active bleeding.   No other evidence of traumatic injury on exam.  Given age will obtain CT head and CT neck.  Will update tetanus.  Will irrigate wound and repaired primarily.  Given patient is well-appearing and has no other evidence of any  injuries, and he denies any preceding syncopal events and remembers the whole fall, if imaging negative, likely discharge back to facility.  Will trial ambulation prior to discharge  Clinical Course as of 02/06/22 1811  Sun Feb 06, 2022  1738 SpO2(!): 80 % [WS]  1738 Pulse Rate(!): 34 Suspect erroneous. Episodes of low "pulse rate" do not correlate with ECG heart rate [WS]  1808 Repaired.  Will attempt ambulation trial.  Patient able to ambulate without new pain or discomfort will discharge back to facility. Will discharge patient to home. All questions answered. Patient comfortable with plan of discharge. Return precautions discussed with patient and specified on the after visit summary.  [WS]    Clinical Course User Index [WS] Lonell GrandchildScheving, Quinley Nesler L, MD     Additional history obtained: -Additional history obtained from family and ems -External records from outside source obtained and reviewed including: Chart review including previous notes, labs, imaging, consultation notes including office visit 06/09/21      Imaging Studies ordered: I ordered imaging studies including CT head and neck On my interpretation imaging demonstrates no acute fracture I independently visualized and interpreted imaging. I agree with the radiologist interpretation   Medicines ordered and prescription drug management: Meds ordered this encounter  Medications   Tdap (BOOSTRIX) injection 0.5 mL   lidocaine-EPINEPHrine (XYLOCAINE W/EPI) 1 %-1:100000 (with pres) injection 20 mL   acetaminophen (TYLENOL) tablet 1,000 mg    -I have reviewed the patients home medicines and have made adjustments as needed  Cardiac Monitoring: The patient was maintained on a cardiac monitor.  I personally  viewed and interpreted the cardiac monitored which showed an underlying rhythm of: afib  Social Determinants of Health:  Diagnosis or treatment significantly limited by social determinants of health: lives alone   Reevaluation: After the interventions noted above, I reevaluated the patient and found that they have improved  Co morbidities that complicate the patient evaluation  Past Medical History:  Diagnosis Date   GERD (gastroesophageal reflux disease)    History of intracranial hemorrhage    OSA (obstructive sleep apnea)    Persistent atrial fibrillation (HCC)    not on oral anticoagulation due to hx of ICH   S/P mitral valve clip 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 Excell Seltzerooper   Severe mitral regurgitation       Dispostion: Disposition decision including need for hospitalization was considered, and patient discharged from emergency department.    Final Clinical Impression(s) / ED Diagnoses Final diagnoses:  Laceration of scalp, initial encounter     This chart was dictated using voice recognition software.  Despite best efforts to proofread,  errors can occur which can change the documentation meaning.    Lonell GrandchildScheving, Adaliz Dobis L, MD 02/06/22 [phone]

## 2022-02-06 NOTE — ED Notes (Signed)
RN ambulated patient out in the hall way. Pt tolerated it well. Patient wanted to sit in the chair in the room RN told him not to get up to call if he needed to get up. Patient verbalized agreement.

## 2022-02-08 ENCOUNTER — Emergency Department (HOSPITAL_COMMUNITY): Payer: Medicare HMO

## 2022-02-08 ENCOUNTER — Emergency Department (HOSPITAL_COMMUNITY)
Admission: EM | Admit: 2022-02-08 | Discharge: 2022-02-08 | Discharge status: Against Medical Advice | Payer: Medicare HMO | Attending: Emergency Medicine | Admitting: Emergency Medicine

## 2022-02-08 ENCOUNTER — Encounter (HOSPITAL_COMMUNITY): Payer: Self-pay | Admitting: Emergency Medicine

## 2022-02-08 ENCOUNTER — Other Ambulatory Visit: Payer: Self-pay

## 2022-02-08 DIAGNOSIS — R531 Weakness: Secondary | ICD-10-CM | POA: Diagnosis present

## 2022-02-08 DIAGNOSIS — R42 Dizziness and giddiness: Secondary | ICD-10-CM | POA: Diagnosis not present

## 2022-02-08 DIAGNOSIS — H532 Diplopia: Secondary | ICD-10-CM | POA: Insufficient documentation

## 2022-02-08 DIAGNOSIS — R519 Headache, unspecified: Secondary | ICD-10-CM | POA: Insufficient documentation

## 2022-02-08 DIAGNOSIS — Z5321 Procedure and treatment not carried out due to patient leaving prior to being seen by health care provider: Secondary | ICD-10-CM | POA: Insufficient documentation

## 2022-02-08 LAB — CBC WITH DIFFERENTIAL/PLATELET
Abs Immature Granulocytes: 0.02 10*3/uL (ref 0.00–0.07)
Basophils Absolute: 0.1 10*3/uL (ref 0.0–0.1)
Basophils Relative: 1 %
Eosinophils Absolute: 0.2 10*3/uL (ref 0.0–0.5)
Eosinophils Relative: 3 %
HCT: 36.9 % — ABNORMAL LOW (ref 39.0–52.0)
Hemoglobin: 11.6 g/dL — ABNORMAL LOW (ref 13.0–17.0)
Immature Granulocytes: 0 %
Lymphocytes Relative: 14 %
Lymphs Abs: 1.1 10*3/uL (ref 0.7–4.0)
MCH: 32.2 pg (ref 26.0–34.0)
MCHC: 31.4 g/dL (ref 30.0–36.0)
MCV: 102.5 fL — ABNORMAL HIGH (ref 80.0–100.0)
Monocytes Absolute: 0.8 10*3/uL (ref 0.1–1.0)
Monocytes Relative: 11 %
Neutro Abs: 5.6 10*3/uL (ref 1.7–7.7)
Neutrophils Relative %: 71 %
Platelets: 202 10*3/uL (ref 150–400)
RBC: 3.6 MIL/uL — ABNORMAL LOW (ref 4.22–5.81)
RDW: 13.8 % (ref 11.5–15.5)
WBC: 7.8 10*3/uL (ref 4.0–10.5)
nRBC: 0 % (ref 0.0–0.2)

## 2022-02-08 LAB — COMPREHENSIVE METABOLIC PANEL
ALT: 12 U/L (ref 0–44)
AST: 19 U/L (ref 15–41)
Albumin: 3.7 g/dL (ref 3.5–5.0)
Alkaline Phosphatase: 93 U/L (ref 38–126)
Anion gap: 11 (ref 5–15)
BUN: 34 mg/dL — ABNORMAL HIGH (ref 8–23)
CO2: 23 mmol/L (ref 22–32)
Calcium: 9.3 mg/dL (ref 8.9–10.3)
Chloride: 104 mmol/L (ref 98–111)
Creatinine, Ser: 1.35 mg/dL — ABNORMAL HIGH (ref 0.61–1.24)
GFR, Estimated: 49 mL/min — ABNORMAL LOW (ref >60)
Glucose, Bld: 124 mg/dL — ABNORMAL HIGH (ref 70–99)
Potassium: 4.4 mmol/L (ref 3.5–5.1)
Sodium: 138 mmol/L (ref 135–145)
Total Bilirubin: 1.2 mg/dL (ref 0.3–1.2)
Total Protein: 6.4 g/dL — ABNORMAL LOW (ref 6.5–8.1)

## 2022-02-08 MED ORDER — LACTATED RINGERS IV BOLUS: 500.0000 mL | Freq: Once | INTRAVENOUS | Status: DC

## 2022-02-08 NOTE — ED Provider Triage Note (Signed)
Emergency Medicine Provider Triage Evaluation Note  Adrian [name] , a 88 y.o. male  was evaluated in triage.  Pt complains of worsening weakness and dizziness.  Patient also endorses headache.  Patient fell on Sunday and was evaluated in this emergency department at that time.  CT scan at that time was negative.  Patient was noted to have a laceration on the posterior scalp.  Since returning home the patient has had continued weakness and states that he has felt intermittently dizzy.  Patient also reported to his family member that he was seeing double vision in the left eye.  He does wear glasses but feels that he still has double vision even with his glasses on. Patient's daughter states that she called neurology who recommended to come to the emergency department for a possible repeat CT scan.  He denies any new falls or trauma  Review of Systems  Positive: As above Negative: As above  Physical Exam  There were no vitals taken for this visit. Gen:   Awake, no distress   Resp:  Normal effort  MSK:   Moves extremities without difficulty  Other:    Medical Decision Making  Medically screening exam initiated at 11:20 AM.  Appropriate orders placed.  Adrian [name] was informed that the remainder of the evaluation will be completed by another provider, this initial triage assessment does not replace that evaluation, and the importance of remaining in the ED until their evaluation is complete.     Darrick Grinder, PA-C 02/08/22 1128

## 2022-02-08 NOTE — ED Triage Notes (Signed)
Pt complains of a fall on Sunday- pt has lac to left posterior head. Pt was seen here for this. Pt has continued to feel weak/unbalanced since then. Pt feels like he has double vision in his left eye and has a headache today. Pt's dtr accompanying him.

## 2022-02-08 NOTE — ED Notes (Signed)
This Clinical research associate called patient phone. Patient son states patient is 19 and can't sit for 10 hours and that he took the patient home.

## 2022-02-09 ENCOUNTER — Encounter: Payer: Self-pay | Admitting: Cardiovascular Disease

## 2022-02-09 DIAGNOSIS — R0602 Shortness of breath: Secondary | ICD-10-CM

## 2022-02-11 ENCOUNTER — Ambulatory Visit (HOSPITAL_COMMUNITY): Payer: Medicare HMO | Attending: Cardiovascular Disease

## 2022-02-11 DIAGNOSIS — R0602 Shortness of breath: Secondary | ICD-10-CM | POA: Insufficient documentation

## 2022-02-11 LAB — ECHOCARDIOGRAM COMPLETE
Area-P 1/2: 3.6 cm2
MV M vel: 3.92 m/s
MV Peak grad: 61.5 mmHg
MV VTI: 1.63 cm2
P 1/2 time: 598 msec
S' Lateral: 3.1 cm

## 2022-02-13 NOTE — Progress Notes (Signed)
Cardiology Clinic Note   Patient Name: Adrian [name] Date of Encounter: 02/16/2022  Primary Care Provider:  Almetta Lovely, [facility] Primary Cardiologist:  Reatha Harps, MD  Patient Profile    Adrian [name] 88 year old male presents the clinic today for follow-up evaluation of his chronic diastolic CHF and evaluation post fall on 02/06/2022.  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 Excell Seltzer   Severe mitral regurgitation    Past Surgical History:  Procedure Laterality Date   CRANIOTOMY     MITRAL VALVE REPAIR N/A 06/18/2020   Procedure: MITRAL VALVE REPAIR;  Surgeon: Tonny Bollman, MD;  Location: [facility] INVASIVE CV LAB;  Service: Cardiovascular;  Laterality: N/A;   RIGHT/LEFT HEART CATH AND CORONARY ANGIOGRAPHY N/A 06/03/2020   Procedure: RIGHT/LEFT HEART CATH AND CORONARY ANGIOGRAPHY;  Surgeon: Kathleene Hazel, MD;  Location: [facility] INVASIVE CV LAB;  Service: Cardiovascular;  Laterality: N/A;   SHOULDER SURGERY     TEE WITHOUT CARDIOVERSION N/A 05/22/2020   Procedure: TRANSESOPHAGEAL ECHOCARDIOGRAM (TEE);  Surgeon: Jodelle Red, MD;  Location: [facility] ENDOSCOPY;  Service: Cardiovascular;  Laterality: N/A;   TEE WITHOUT CARDIOVERSION N/A 06/18/2020   Procedure: TRANSESOPHAGEAL ECHOCARDIOGRAM (TEE);  Surgeon: Tonny Bollman, MD;  Location: [facility] INVASIVE CV LAB;  Service: Cardiovascular;  Laterality: N/A;    Allergies  Allergies  Allergen Reactions   Milk Protein Other (See Comments)    Causes runny nose and sneezing     History of Present Illness    Adrian [name] has a PMH of chronic diastolic CHF,  persistent atrial fibrillation, nonrheumatic mitral valve regurgitation status post mitral valve clip (06/18/2020), OSA, GERD, and intracranial hemorrhage.  He was seen in follow-up by Georgie Chard, NP-C on 06/09/2021.  He presented with his son.  He continued to do well post MitraClip.  He was living in an assisted living facility.  He was participating in multiple exercise classes.  His main complaint related to a neck spur which was causing him discomfort.  He was seen as orthopedist as well as doing physical therapy.  His blood pressure was well-controlled.  He denies chest pain, shortness of breath, lower extremity swelling, and palpitations.  His son contacted the clinic via MyChart message on 02/09/2022.  He reported that his father had a fall at home on 02/06/2022.  He was seen and evaluated emergency department.  He received head CT (large.  Subdural scalp soft tissue defect with trace hematoma formation and no other acute changes), blood work, skin staples for head laceration.  His lab work at that time showed slightly elevated creatinine at 1.35, improving hemoglobin of 11.6 and no other abnormalities.  EKG 1129/23 showed atrial fibrillation with PVCs 64 bpm.  His echocardiogram 02/11/2022 showed well-positioned MitraClip, EF 60-65%, intermediate diastolic parameters, aortic dilation measuring 40 mm at the aortic root, moderate tricuspid valve regurgitation, and no significant changes from his previous study.  He presents to the clinic today for follow-up evaluation with his son and states he tripped [place].  He was evaluated by neurology yesterday.  He was noted to  have slight right-sided weakness compared to left.  He reports not hydrating well.  We performed orthostatics today and he was noted to have a 29 point difference in his systolic blood pressure when going from laying to sitting.  His head incision still has staples intact and is healing well without signs of  infection.  We reviewed his echocardiogram from his hospital visit.  They expressed understanding.  Decrease his furosemide to 20 mg every other day and keep follow-up appointment with Dr. Flora Lipps in February.  Today he denies chest pain, shortness of breath, lower extremity edema, fatigue, palpitations, melena, hematuria, hemoptysis, diaphoresis,  presyncope, syncope, orthopnea, and PND.     Home Medications    Prior to Admission medications   Medication Sig Start Date End Date Taking? Authorizing Provider  acetaminophen (TYLENOL) 500 MG tablet Take 500 mg by mouth in the morning, at noon, and at bedtime. (0900, 1300 & 2100)    [provider]  amoxicillin (AMOXIL) 500 MG tablet Take 2,000 mg (4 tablets) 1 hour prior to all dental visits. 07/23/20   Janetta Hora, PA-C  aspirin 81 MG EC tablet Take 81 mg by mouth daily. (0900)    [provider]  Calcium Carb-Cholecalciferol (CALCIUM 500 + D3) 500-200 MG-UNIT TABS Take 1 tablet by mouth daily. (0900)    [provider]  diclofenac Sodium (VOLTAREN) 1 % GEL Apply 2 g topically in the morning and at bedtime. (0900 & 2100)    [provider]  fluticasone (FLONASE) 50 MCG/ACT nasal spray Place 1 spray into both nostrils in the morning and at bedtime. (0900 & 1700)    [provider]  furosemide (LASIX) 40 MG tablet Take 1 tablet every other day 04/21/21   Sande Rives, MD  gabapentin (NEURONTIN) 300 MG capsule Take 300 mg by mouth in the morning and at bedtime. 04/12/21   [provider]  guaiFENesin (MUCINEX) 600 MG 12 hr tablet Take 600 mg by mouth 2 (two) times daily. (0900 & 1700)    [provider]  metoprolol tartrate (LOPRESSOR) 25 MG tablet Take 25 mg by mouth daily. 05/19/21   [provider]  montelukast (SINGULAIR) 10 MG tablet Take 10 mg by mouth at bedtime. 12/02/19   [provider]  Multiple Vitamin (MULTIVITAMIN WITH MINERALS) TABS tablet Take 1  tablet by mouth daily. (0900)    [provider]  Multiple Vitamins-Minerals (PRESERVISION AREDS 2 PO) Take 1 tablet by mouth in the morning and at bedtime. (0900 & 1700)    [provider]  nitroGLYCERIN (NITROSTAT) 0.4 MG SL tablet Place 1 tablet (0.4 mg total) under the tongue every 5 (five) minutes x 3 doses as needed for chest pain. 05/24/20   [facility], Roe Rutherford, PA  pantoprazole (PROTONIX) 40 MG tablet TAKE 1 TABLET BY MOUTH DAILY. --DX: GERD 07/20/21   Janetta Hora, PA-C  rivastigmine (EXELON) 3 MG capsule Take 3 mg by mouth 2 (two) times daily. (0900 & 1700) 04/11/20   [provider]  Saw Palmetto 500 MG CAPS Take 500 mg by mouth daily at 12 noon. (1200)    [provider]  sertraline (ZOLOFT) 25 MG tablet Take 25 mg by mouth at bedtime. (2100) 05/25/20   [provider]  Skin Protectants, Misc. (BAZA PROTECT EX) Apply 1 application topically in the morning and at bedtime. (0900 & 2100)    [provider]    Family History    Family History  Problem Relation Age of Onset   Cancer Father    He indicated that his mother is deceased. He indicated that his father is deceased.  Social History    Social History   Socioeconomic History   Marital status: Widowed    Spouse name: Not on file   Number of children: 7   Years of education: Not on file   Highest education level: Not on file  Occupational History   Occupation: retired Immunologist  Tobacco Use   Smoking status: Never   Smokeless tobacco: Never  Vaping Use   Vaping Use: Never used  Substance and Sexual Activity   Alcohol use: Never   Drug use: Never   Sexual activity: Not on file  Other Topics Concern   Not on file  Social History Narrative   Not on file   Social Determinants of Health   Financial Resource Strain: Not on file  Food Insecurity: Not on file  Transportation Needs: Not on file  Physical Activity: Not on file  Stress: Not on file  Social  Connections: Not on file  Intimate Partner Violence: Not on file     Review of Systems    General:  No chills, fever, night sweats or weight changes.  Cardiovascular:  No chest pain, dyspnea on exertion, edema, orthopnea, palpitations, paroxysmal nocturnal dyspnea. Dermatological: No rash, lesions/masses Respiratory: No cough, dyspnea Urologic: No hematuria, dysuria Abdominal:   No nausea, vomiting, diarrhea, bright red blood per rectum, melena, or hematemesis Neurologic:  No visual changes, wkns, changes in mental status. All other systems reviewed and are otherwise negative except as noted above.  Physical Exam    VS:  BP 117/70   Pulse 87   Ht 5\' 6"  (1.676 m)   Wt 151 lb (68.5 kg)   SpO2 98%   BMI 24.37 kg/m  , BMI Body mass index is 24.37 kg/m. GEN: Well nourished, well developed, in no acute distress. HEENT: normal. Neck: Supple, no JVD, carotid bruits, or masses. Cardiac: RRR, no murmurs, rubs, or gallops. No clubbing, cyanosis, edema.  Radials/DP/PT 2+ and equal bilaterally.  Respiratory:  Respirations regular and unlabored, clear to auscultation bilaterally. GI: Soft, nontender, nondistended, BS + x 4. MS: no deformity or atrophy. Skin: warm and dry, no rash. Neuro:  Strength and sensation are intact. Psych: Normal affect.  Accessory Clinical Findings    Recent Labs: 02/08/2022: ALT 12; BUN 34; Creatinine, Ser 1.35; Hemoglobin 11.6; Platelets 202; Potassium 4.4; Sodium 138   Recent Lipid Panel No results found for: "CHOL", "TRIG", "HDL", "CHOLHDL", "VLDL", "LDLCALC", "LDLDIRECT"       ECG personally reviewed by me today-none today.  Echocardiogram 02/11/2022 IMPRESSIONS     1. There is a single Mitra-Clip G4XTW positioned on the medial aspect  A2/P2 present in the mitral position. Procedure Date: 06/18/2020.   2. Right ventricular systolic function is normal. The right ventricular  size is mildly enlarged. There is moderately elevated pulmonary artery   systolic pressure.   3. Left ventricular ejection fraction, by estimation, is 60 to 65%. The  left ventricle has normal function. The left ventricle has no regional  wall motion abnormalities. There is mild left ventricular hypertrophy.  Left ventricular diastolic parameters  are indeterminate.   4. The aortic valve is tricuspid. There is mild calcification of the  aortic valve. There is mild thickening of the aortic valve. Aortic valve  regurgitation is mild. Aortic valve sclerosis is present, with no evidence  of aortic valve stenosis.  5. The inferior vena cava is dilated in size with >50% respiratory  variability, suggesting right atrial pressure of 8 mmHg.   6. Aortic dilatation noted. There is borderline dilatation of the aortic  root, measuring 40 mm.   7. Tricuspid valve regurgitation is moderate.   Comparison(s): No significant change from prior study. Prior images  reviewed side by side.   FINDINGS   Left Ventricle: Left ventricular ejection fraction, by estimation, is 60  to 65%. The left ventricle has normal function. The left ventricle has no  regional wall motion abnormalities. The left ventricular internal cavity  size was normal in size. There is   mild left ventricular hypertrophy. Left ventricular diastolic parameters  are indeterminate.   Right Ventricle: The right ventricular size is mildly enlarged. No  increase in right ventricular wall thickness. Right ventricular systolic  function is normal. There is moderately elevated pulmonary artery systolic  pressure. The tricuspid regurgitant  velocity is 3.12 m/s, and with an assumed right atrial pressure of 8 mmHg,  the estimated right ventricular systolic pressure is 46.9 mmHg.   Left Atrium: Left atrial size was normal in size.   Right Atrium: Right atrial size was normal in size.   Pericardium: There is no evidence of pericardial effusion.   Mitral Valve: The mitral valve is normal in structure. Moderate  mitral  valve regurgitation, with anteriorly-directed jet. There is a Mitra-Clip  present in the mitral position. Procedure Date: 06/18/2020. Mild mitral  valve stenosis. MV peak gradient, 15.1  mmHg. The mean mitral valve gradient is 4.0 mmHg.   Tricuspid Valve: The tricuspid valve is normal in structure. Tricuspid  valve regurgitation is moderate . No evidence of tricuspid stenosis.   Aortic Valve: The aortic valve is tricuspid. There is mild calcification  of the aortic valve. There is mild thickening of the aortic valve. Aortic  valve regurgitation is mild. Aortic regurgitation PHT measures 598 msec.  Aortic valve sclerosis is present,   with no evidence of aortic valve stenosis.   Pulmonic Valve: The pulmonic valve was normal in structure. Pulmonic valve  regurgitation is trivial. No evidence of pulmonic stenosis.   Aorta: Aortic dilatation noted. There is borderline dilatation of the  aortic root, measuring 40 mm.   Venous: The inferior vena cava is dilated in size with greater than 50%  respiratory variability, suggesting right atrial pressure of 8 mmHg.   IAS/Shunts: No atrial level shunt detected by color flow Doppler.   Assessment & Plan   1. Chronic diastolic CHF-euvolemic today.  Weight stable.  Is slowly returning to his baseline activities. Continue ASA, metoprolol, Heart healthy low-sodium diet-salty 6 given Increase physical activity as tolerated  Orthostatic hypotension-noted to have a 20 point change in systolic blood pressure when moving from laying to sitting.  Blood pressure stable when moving from sitting to standing after 3 minutes.  Reports poor hydration. Slightly increase p.o. hydration Decrease furosemide to 20 mg every other day Maintain weight log Lower extremity support stockings   Severe mitral valve regurgitation status post TEER-echocardiogram 02/11/2022 showed well-positioned MitraClip, EF 60-65%, intermediate diastolic parameters, moderate  tricuspid valve regurgitation and no significant changes from previous echocardiogram. Continue SBE prophylaxis Maintain physical activity  Coronary artery disease-no chest pain today.  Underwent cardiac catheterization which showed severe 80% distal LAD disease. Continue current medical therapy  Essential hypertension-BP today 117/70. Continue furosemide every other day and reduce to 20 mg Continue metoprolol Heart healthy low-sodium diet Maintain physical activity  Persistent atrial  fibrillation-heart rate today 87 .  Denies episodes of lightheadedness, presyncope and syncope.  Not a candidate for anticoagulation due to history of intracranial hemorrhage, age, and history of falls.  Asymptomatic.  Disposition: Follow-up with Dr. Flora Lipps as scheduled.  Thomasene Ripple. Lilya Smitherman NP-C     02/16/2022, 8:56 AM [facility] [address] Office [phone] Fax [phone]    I spent 14 minutes examining this patient, reviewing medications, and using patient centered shared decision making involving her cardiac care.  Prior to her visit I spent greater than 20 minutes reviewing her past medical history,  medications, and prior cardiac tests.

## 2022-02-16 ENCOUNTER — Ambulatory Visit: Payer: Medicare HMO | Attending: General Practice | Admitting: General Practice

## 2022-02-16 ENCOUNTER — Encounter: Payer: Self-pay | Admitting: General Practice

## 2022-02-16 VITALS — BP 117/70 | HR 87 | Ht 66.0 in | Wt 151.0 lb

## 2022-02-16 DIAGNOSIS — I340 Nonrheumatic mitral (valve) insufficiency: Secondary | ICD-10-CM | POA: Diagnosis not present

## 2022-02-16 DIAGNOSIS — I9589 Other hypotension: Secondary | ICD-10-CM

## 2022-02-16 DIAGNOSIS — I10 Essential (primary) hypertension: Secondary | ICD-10-CM | POA: Diagnosis not present

## 2022-02-16 DIAGNOSIS — Z95818 Presence of other cardiac implants and grafts: Secondary | ICD-10-CM

## 2022-02-16 DIAGNOSIS — I5032 Chronic diastolic (congestive) heart failure: Secondary | ICD-10-CM | POA: Diagnosis not present

## 2022-02-16 DIAGNOSIS — E861 Hypovolemia: Secondary | ICD-10-CM

## 2022-02-16 DIAGNOSIS — Z9889 Other specified postprocedural states: Secondary | ICD-10-CM

## 2022-02-16 DIAGNOSIS — I4819 Other persistent atrial fibrillation: Secondary | ICD-10-CM

## 2022-02-16 MED ORDER — FUROSEMIDE 20 MG PO TABS: ORAL_TABLET | ORAL | 6 refills | Status: DC

## 2022-02-16 NOTE — Patient Instructions (Addendum)
Medication Instructions:  DECREASE FUROSEMIDE 20MG  EVERY-OTHER-DAY  *If you need a refill on your cardiac medications before your next appointment, please call your pharmacy*  Lab Work: NONE If you have labs (blood work) drawn today and your tests are completely normal, you will receive your results only by:  MyChart Message (if you have MyChart) OR  A paper copy in the mail If you have any lab test that is abnormal or we need to change your treatment, we will call you to review the results.  Testing/Procedures: NONE  Follow-Up: At [facility], you and your health needs are our priority.  As part of our continuing mission to provide you with exceptional heart care, we have created designated Provider Care Teams.  These Care Teams include your primary Cardiologist (physician) and Advanced Practice Providers (APPs -  Physician Assistants and Nurse Practitioners) who all work together to provide you with the care you need, when you need it.  Your next appointment:   KEEP SCHEDULED   The format for your next appointment:   In Person  Provider:   [facility], MD     Other Instructions MAINTAIN FALL PRECAUTIONS NO RUGS, KEEP EXTENSION CORDS OUT OF WALKING AREAS, RAISE UP SLOWLY, WAIT BEFORE WALKING  MAINTAIN HYDRATION  PLEASE PURCHASE AND WEAR COMPRESSION STOCKINGS DAILY AND TAKE OFF AT BEDTIME. Compression stockings are elastic socks that squeeze the legs. They help to increase blood flow to the legs and to decrease swelling in the legs from fluid retention, and reduce the chance of developing blood clots in the lower legs. Please put on in the AM when dressing and off at night when dressing for bed.  LET THEM KNOW THAT YOU NEED KNEE HIGH'S WITH COMPRESSION OF 15-20 mmhg.  [employer]  [address] ([phone]); [place], [place]; [phone]  EMAIL   [email or web address].  PLEASE MAKE SURE TO ELEVATE YOUR FEET & LEGS WHILE SITTING, THIS  WILL HELP WITH THE SWELLING ALSO.    Important Information About Sugar

## 2022-04-14 ENCOUNTER — Encounter: Payer: Self-pay | Admitting: Cardiovascular Disease

## 2022-05-01 NOTE — Progress Notes (Unsigned)
Cardiology Office Note:   Date:  05/03/2022  NAME:  Adrian [name]    MRN: [id number] DOB:  October 07, 1927   PCP:  Orvis Brill, [facility]  Cardiologist:  Evalina Field, MD  Electrophysiologist:  None   Referring MD: Housecalls, [facility]*   Chief Complaint  Patient presents with   Follow-up        History of Present Illness:   Adrian [name] is a 89 y.o. male with a hx of HFpEF, HTN, persistent Afib, OSA, MR s/p clip, CAD who presents for follow-up.  He presents with his son.  Reports low energy.  No increased weight gain.  Volume status is acceptable.  Blood pressure 98/58.  Heart rate 41.  Irregular.  We discussed stopping his metoprolol.  He is eating and drinking well.  He is taking Lasix every other day.  No signs of worsening congestive heart failure.  No cough.  No congestion.  No signs of pneumonia from what he tells me.  No worsening heart murmur.  Overall things seem stable.  Seems to be doing well.  Working with physical therapy.   Problem List 1. Intracranial hemorrhage -while on coumadin in past 2. HTN 3. Persistent Afib -CHADSVASC=3 (age, HTN) -EF 60-65% -normal MPI 11/2019 ([facility]) 4. OSA 5. Severe mitral regurgitation -P2 flail -mitraclip (06/18/2020)  6. CAD -80% dLAD  Past Medical History: Past Medical History:  Diagnosis Date   GERD (gastroesophageal reflux disease)    History of intracranial hemorrhage    OSA (obstructive sleep apnea)    Persistent atrial fibrillation (HCC)    not on oral anticoagulation due to hx of ICH   S/P mitral valve clip 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 Burt Knack   Severe mitral regurgitation     Past Surgical History: Past Surgical History:  Procedure Laterality Date   CRANIOTOMY     MITRAL VALVE REPAIR N/A 06/18/2020   Procedure: MITRAL VALVE REPAIR;  Surgeon: Sherren Mocha, MD;  Location: [place] [facility];  Service:  Cardiovascular;  Laterality: N/A;   RIGHT/LEFT HEART CATH AND CORONARY ANGIOGRAPHY N/A 06/03/2020   Procedure: RIGHT/LEFT HEART CATH AND CORONARY ANGIOGRAPHY;  Surgeon: Burnell Blanks, MD;  Location: [place] [facility];  Service: Cardiovascular;  Laterality: N/A;   SHOULDER SURGERY     TEE WITHOUT CARDIOVERSION N/A 05/22/2020   Procedure: TRANSESOPHAGEAL ECHOCARDIOGRAM (TEE);  Surgeon: Buford Dresser, MD;  Location: [facility]OS[facility];  Service: Cardiovascular;  Laterality: N/A;   TEE WITHOUT CARDIOVERSION N/A 06/18/2020   Procedure: TRANSESOPHAGEAL ECHOCARDIOGRAM (TEE);  Surgeon: Sherren Mocha, MD;  Location: [place] [facility];  Service: Cardiovascular;  Laterality: N/A;    Current Medications: Current Meds  Medication Sig   acetaminophen (TYLENOL) 500 MG tablet Take 500 mg by mouth in the morning, at noon, and at bedtime. (0900, 1300 & 2100)   acyclovir (ZOVIRAX) 400 MG tablet Take 400 mg by mouth 3 (three) times daily.   amoxicillin (AMOXIL) 500 MG tablet Take 2,000 mg (4 tablets) 1 hour prior to all dental visits.   aspirin 81 MG EC tablet Take 81 mg by mouth daily. (0900)   Calcium Carb-Cholecalciferol (CALCIUM 500 + D3) 500-200 MG-UNIT TABS Take 1 tablet by mouth daily. (0900)   diclofenac Sodium (VOLTAREN) 1 % GEL Apply 2 g topically in the morning and at bedtime. (0900 & 2100)   fluticasone (FLONASE) 50 MCG/ACT nasal spray Place 1 spray into both  nostrils in the morning and at bedtime. (0900 & 1700)   furosemide (LASIX) 20 MG tablet Take 1 tablet every other day   gabapentin (NEURONTIN) 100 MG capsule Take 100 mg by mouth daily.   gabapentin (NEURONTIN) 300 MG capsule Take 300 mg by mouth in the morning and at bedtime. Take 300 mg in the morning and 600 mg at bedtime.   guaiFENesin (MUCINEX) 600 MG 12 hr tablet Take 600 mg by mouth 2 (two) times daily. (0900 & 1700)   montelukast (SINGULAIR) 10 MG tablet Take 10 mg by mouth at bedtime.   Multiple Vitamin (MULTIVITAMIN WITH  MINERALS) TABS tablet Take 1 tablet by mouth daily. (0900)   Multiple Vitamins-Minerals (PRESERVISION AREDS 2 PO) Take 1 tablet by mouth in the morning and at bedtime. (0900 & 1700)   nitroGLYCERIN (NITROSTAT) 0.4 MG SL tablet Place 1 tablet (0.4 mg total) under the tongue every 5 (five) minutes x 3 doses as needed for chest pain.   pantoprazole (PROTONIX) 40 MG tablet TAKE 1 TABLET BY MOUTH DAILY. --DX: GERD   rivastigmine (EXELON) 3 MG capsule Take 3 mg by mouth 2 (two) times daily. (0900 & 1700)   Saw Palmetto 500 MG CAPS Take 500 mg by mouth daily at 12 noon. (1200)   Skin Protectants, Misc. (BAZA PROTECT EX) Apply 1 application topically in the morning and at bedtime. (0900 & 2100)   [DISCONTINUED] metoprolol tartrate (LOPRESSOR) 25 MG tablet Take 25 mg by mouth daily.     Allergies:    Milk protein   Social History: Social History   Socioeconomic History   Marital status: Widowed    Spouse name: Not on file   Number of children: 7   Years of education: Not on file   Highest education level: Not on file  Occupational History   Occupation: retired Technical brewer  Tobacco Use   Smoking status: Never   Smokeless tobacco: Never  Vaping Use   Vaping Use: Never used  Substance and Sexual Activity   Alcohol use: Never   Drug use: Never   Sexual activity: Not on file  Other Topics Concern   Not on file  Social History Narrative   Not on file   Social Determinants of Health   Financial Resource Strain: Not on file  Food Insecurity: Not on file  Transportation Needs: Not on file  Physical Activity: Not on file  Stress: Not on file  Social Connections: Not on file     Family History: The patient's family history includes Cancer in his father.  ROS:   All other ROS reviewed and negative. Pertinent positives noted in the HPI.     EKGs/Labs/Other Studies Reviewed:   The following studies were personally reviewed by me today:  TTE 02/11/2022  1. There is a single Mitra-Clip  G4XTW positioned on the medial aspect  A2/P2 present in the mitral position. Procedure Date: 06/18/2020.   2. Right ventricular systolic function is normal. The right ventricular  size is mildly enlarged. There is moderately elevated pulmonary artery  systolic pressure.   3. Left ventricular ejection fraction, by estimation, is 60 to 65%. The  left ventricle has normal function. The left ventricle has no regional  wall motion abnormalities. There is mild left ventricular hypertrophy.  Left ventricular diastolic parameters  are indeterminate.   4. The aortic valve is tricuspid. There is mild calcification of the  aortic valve. There is mild thickening of the aortic valve. Aortic valve  regurgitation is  mild. Aortic valve sclerosis is present, with no evidence  of aortic valve stenosis.   5. The inferior vena cava is dilated in size with >50% respiratory  variability, suggesting right atrial pressure of 8 mmHg.   6. Aortic dilatation noted. There is borderline dilatation of the aortic  root, measuring 40 mm.   7. Tricuspid valve regurgitation is moderate.   Recent Labs: 02/08/2022: ALT 12; BUN 34; Creatinine, Ser 1.35; Hemoglobin 11.6; Platelets 202; Potassium 4.4; Sodium 138   Recent Lipid Panel No results found for: "CHOL", "TRIG", "HDL", "CHOLHDL", "VLDL", "LDLCALC", "LDLDIRECT"  Physical Exam:   VS:  BP (!) 98/58 (BP Location: Left Arm, Patient Position: Sitting, Cuff Size: Normal)   Pulse (!) 41   Ht 5' 6"$  (1.676 m)   Wt 155 lb (70.3 kg)   SpO2 99%   BMI 25.02 kg/m    Wt Readings from Last 3 Encounters:  05/03/22 155 lb (70.3 kg)  02/16/22 151 lb (68.5 kg)  02/06/22 154 lb 5.2 oz (70 kg)    General: Well nourished, well developed, in no acute distress Head: Atraumatic, normal size  Eyes: PEERLA, EOMI  Neck: Supple, no JVD Endocrine: No thryomegaly Cardiac: Normal S1, S2; irregular rhythm, no murmurs Lungs: Clear to auscultation bilaterally, no wheezing, rhonchi or rales   Abd: Soft, nontender, no hepatomegaly  Ext: No edema, pulses 2+ Musculoskeletal: No deformities, BUE and BLE strength normal and equal Skin: Warm and dry, no rashes   Neuro: Alert and oriented to person, place, time, and situation, CNII-XII grossly intact, no focal deficits  Psych: Normal mood and affect   ASSESSMENT:   Inti Kroll is a 89 y.o. male who presents for the following: 1. SOB (shortness of breath) on exertion   2. Chronic diastolic heart failure ([place])   3. Severe mitral insufficiency   4. S/P mitral valve clip implantation   5. Primary hypertension   6. Persistent atrial fibrillation ([place])   7. Acquired thrombophilia ([place])   8. Coronary artery disease involving native coronary artery of native heart without angina pectoris     PLAN:   1. SOB (shortness of breath) on exertion -Symptoms are more consistent with fatigue.  No signs of worsening volume overload.  Will check a CBC, BMP and TSH.  No signs of pneumonia.  Pulse is in the 40s.  Irregular.  Has permanent A-fib.  Blood pressure a bit low.  We will stop his metoprolol to see if this helps.  If symptoms persist could consider heart monitor.  It simply may just be his heart rate is too low and he needs to not be on metoprolol.  We will see how he does.  Most recent echo with stable LV function and stable mitral valve repair.  2. Chronic diastolic heart failure (HCC) -No signs of overload.  Continue with Lasix every other day.  3. Severe mitral insufficiency 4. S/P mitral valve clip implantation -Status post MitraClip.  No significant murmur.  Most recent echo shows moderate residual regurgitation.  5. Primary hypertension -Heart rate a bit too low.  BP low.  Stop metoprolol.  Check labs as above.  6. Persistent atrial fibrillation ([place]) 7. Acquired thrombophilia ([place]) -Not on anticoagulation due to prior intracranial hemorrhage.  Heart rate a bit low.  Stop metoprolol.  This could be contributing to his  fatigue.  8. Coronary artery disease involving native coronary artery of native heart without angina pectoris -80% distal LAD lesion.  No symptoms of angina.  On aspirin.  Not on statin.  Given age conservative approach is warranted.  Disposition: Return in about 6 months (around 11/01/2022).  Medication Adjustments/Labs and Tests Ordered: Current medicines are reviewed at length with the patient today.  Concerns regarding medicines are outlined above.  Orders Placed This Encounter  Procedures   CBC   Brain natriuretic peptide   TSH   No orders of the defined types were placed in this encounter.   Patient Instructions  Medication Instructions:  STOP Metoprolol   *If you need a refill on your cardiac medications before your next appointment, please call your pharmacy*   Lab Work: CBC, BNP, TSH today   If you have labs (blood work) drawn today and your tests are completely normal, you will receive your results only by: [place] (if you have MyChart) OR A paper copy in the mail If you have any lab test that is abnormal or we need to change your treatment, we will call you to review the results.  Follow-Up: At [facility], you and your health needs are our priority.  As part of our continuing mission to provide you with exceptional heart care, we have created designated Provider Care Teams.  These Care Teams include your primary Cardiologist (physician) and Advanced Practice Providers (APPs -  Physician Assistants and Nurse Practitioners) who all work together to provide you with the care you need, when you need it.  We recommend signing up for the patient portal called "MyChart".  Sign up information is provided on this After Visit Summary.  MyChart is used to connect with patients for Virtual Visits (Telemedicine).  Patients are able to view lab/test results, encounter notes, upcoming appointments, etc.  Non-urgent messages can be sent to your provider as well.   To  learn more about what you can do with MyChart, go to [email or web address].    Your next appointment:   6 month(s)  Provider:   Evalina Field, MD or Sande Rives, PA-C, or Almyra Deforest, PA-C     Time Spent with Patient: I have spent a total of 35 minutes with patient reviewing hospital notes, telemetry, EKGs, labs and examining the patient as well as establishing an assessment and plan that was discussed with the patient.  > 50% of time was spent in direct patient care.  Signed, Addison Naegeli. Audie Box, MD, [place]  [address] [place], [place] [address] [phone]  05/03/2022 2:28 PM

## 2022-05-03 ENCOUNTER — Encounter: Payer: Self-pay | Admitting: Cardiovascular Disease

## 2022-05-03 ENCOUNTER — Ambulatory Visit: Payer: Medicare HMO | Attending: Cardiovascular Disease | Admitting: Cardiovascular Disease

## 2022-05-03 VITALS — BP 98/58 | HR 41 | Ht 66.0 in | Wt 155.0 lb

## 2022-05-03 DIAGNOSIS — I340 Nonrheumatic mitral (valve) insufficiency: Secondary | ICD-10-CM

## 2022-05-03 DIAGNOSIS — I4819 Other persistent atrial fibrillation: Secondary | ICD-10-CM

## 2022-05-03 DIAGNOSIS — Z95818 Presence of other cardiac implants and grafts: Secondary | ICD-10-CM

## 2022-05-03 DIAGNOSIS — D6869 Other thrombophilia: Secondary | ICD-10-CM

## 2022-05-03 DIAGNOSIS — Z9889 Other specified postprocedural states: Secondary | ICD-10-CM | POA: Diagnosis not present

## 2022-05-03 DIAGNOSIS — R0602 Shortness of breath: Secondary | ICD-10-CM

## 2022-05-03 DIAGNOSIS — I10 Essential (primary) hypertension: Secondary | ICD-10-CM

## 2022-05-03 DIAGNOSIS — I5032 Chronic diastolic (congestive) heart failure: Secondary | ICD-10-CM

## 2022-05-03 DIAGNOSIS — I2510 Atherosclerotic heart disease of native coronary artery without angina pectoris: Secondary | ICD-10-CM

## 2022-05-03 NOTE — Patient Instructions (Signed)
Medication Instructions:  STOP Metoprolol   *If you need a refill on your cardiac medications before your next appointment, please call your pharmacy*   Lab Work: CBC, BNP, TSH today   If you have labs (blood work) drawn today and your tests are completely normal, you will receive your results only by: [place] (if you have MyChart) OR A paper copy in the mail If you have any lab test that is abnormal or we need to change your treatment, we will call you to review the results.  Follow-Up: At [facility], you and your health needs are our priority.  As part of our continuing mission to provide you with exceptional heart care, we have created designated Provider Care Teams.  These Care Teams include your primary Cardiologist (physician) and Advanced Practice Providers (APPs -  Physician Assistants and Nurse Practitioners) who all work together to provide you with the care you need, when you need it.  We recommend signing up for the patient portal called "MyChart".  Sign up information is provided on this After Visit Summary.  MyChart is used to connect with patients for Virtual Visits (Telemedicine).  Patients are able to view lab/test results, encounter notes, upcoming appointments, etc.  Non-urgent messages can be sent to your provider as well.   To learn more about what you can do with MyChart, go to [email or web address].    Your next appointment:   6 month(s)  Provider:   Evalina Field, MD or Sande Rives, PA-C, or Almyra Deforest, [place]

## 2022-05-04 LAB — CBC
Hematocrit: 37.8 % (ref 37.5–51.0)
Hemoglobin: 12 g/dL — ABNORMAL LOW (ref 13.0–17.7)
MCH: 30.5 pg (ref 26.6–33.0)
MCHC: 31.7 g/dL (ref 31.5–35.7)
MCV: 96 fL (ref 79–97)
Platelets: 214 10*3/uL (ref 150–450)
RBC: 3.94 x10E6/uL — ABNORMAL LOW (ref 4.14–5.80)
RDW: 12.4 % (ref 11.6–15.4)
WBC: 6.3 10*3/uL (ref 3.4–10.8)

## 2022-05-04 LAB — TSH: TSH: 3.59 u[IU]/mL (ref 0.450–4.500)

## 2022-05-04 LAB — BRAIN NATRIURETIC PEPTIDE: BNP: 319.6 pg/mL — ABNORMAL HIGH (ref 0.0–100.0)

## 2022-05-13 ENCOUNTER — Encounter: Payer: Self-pay | Admitting: Cardiovascular Disease

## 2022-05-30 ENCOUNTER — Ambulatory Visit: Payer: Medicare HMO | Admitting: Orthopaedic Surgery

## 2022-06-01 ENCOUNTER — Encounter: Payer: Self-pay | Admitting: Orthopaedic Surgery

## 2022-06-01 ENCOUNTER — Other Ambulatory Visit (INDEPENDENT_AMBULATORY_CARE_PROVIDER_SITE_OTHER): Payer: Medicare HMO

## 2022-06-01 ENCOUNTER — Ambulatory Visit: Payer: Medicare HMO | Admitting: Orthopaedic Surgery

## 2022-06-01 VITALS — Ht 66.0 in | Wt 154.0 lb

## 2022-06-01 DIAGNOSIS — M25561 Pain in right knee: Secondary | ICD-10-CM

## 2022-06-01 DIAGNOSIS — M25562 Pain in left knee: Secondary | ICD-10-CM

## 2022-06-01 DIAGNOSIS — M1712 Unilateral primary osteoarthritis, left knee: Secondary | ICD-10-CM | POA: Diagnosis not present

## 2022-06-01 DIAGNOSIS — M170 Bilateral primary osteoarthritis of knee: Secondary | ICD-10-CM

## 2022-06-01 MED ORDER — BUPIVACAINE HCL 0.5 % IJ SOLN
3.0000 mL | INTRAMUSCULAR | Status: AC | PRN
  Administered 2022-06-01: 3 mL via INTRA_ARTICULAR

## 2022-06-01 MED ORDER — METHYLPREDNISOLONE ACETATE 40 MG/ML IJ SUSP
40.0000 mg | INTRAMUSCULAR | Status: AC | PRN
  Administered 2022-06-01: 40 mg via INTRA_ARTICULAR

## 2022-06-01 MED ORDER — LIDOCAINE HCL 1 % IJ SOLN
0.5000 mL | INTRAMUSCULAR | Status: AC | PRN
  Administered 2022-06-01: .5 mL

## 2022-06-01 NOTE — Progress Notes (Signed)
Office Visit Note   Patient: Adrian [name]           Date of Birth: 01/16/1928           MRN: [id number] Visit Date: 06/01/2022              Requested by: Orvis Brill, [facility] [address] [place],  [place] [address] PCP: [facility], [facility]   Assessment & Plan: Visit Diagnoses:  1. Acute pain of both knees   2. Bilateral primary osteoarthritis of knee     Plan: Left knee injection performed if he gets good relief he could return at some point in the future for right knee injection if he so desires.  Continue his therapy efforts with quad strengthening and attempts at ambulation using walker.  Follow-Up Instructions: No follow-ups on file.   Orders:  Orders Placed This Encounter  Procedures   XR Knee 1-2 Views Right   XR Knee 1-2 Views Left   No orders of the defined types were placed in this encounter.     Procedures: Large Joint Inj: L knee on 06/01/2022 4:49 PM Indications: joint swelling and pain Details: 22 G 1.5 in needle, anterolateral approach  Arthrogram: No  Medications: 0.5 mL lidocaine 1 %; 3 mL bupivacaine 0.5 %; 40 mg methylPREDNISolone acetate 40 MG/ML Outcome: tolerated well, no immediate complications Procedure, treatment alternatives, risks and benefits explained, specific risks discussed. Consent was given by the patient. Immediately prior to procedure a time out was called to verify the correct patient, procedure, equipment, support staff and site/side marked as required. Patient was prepped and draped in the usual sterile fashion.       Clinical Data: No additional findings.   Subjective: Chief Complaint  Patient presents with   Right Knee - Pain   Left Knee - Pain    HPI 89 year old male with history of heart failure GERD mitral regurgitation and atrial fibrillation has bilateral knee pain problems.  He has keeps his knees locked he has a wheelchair he is been using.  Stride do some therapy but pain in his knee  has been a problem despite knee braces.  Review of Systems all systems noncontributory to HPI.   Objective: Vital Signs: Ht 5\' 6"  (1.676 m)   Wt 154 lb (69.9 kg)   BMI 24.86 kg/m   Physical Exam Constitutional:      Appearance: He is well-developed.  HENT:     Head: Normocephalic and atraumatic.     Right Ear: External ear normal.     Left Ear: External ear normal.  Eyes:     Pupils: Pupils are equal, round, and reactive to light.  Neck:     Thyroid: No thyromegaly.     Trachea: No tracheal deviation.  Pulmonary:     Effort: Pulmonary effort is normal.     Breath sounds: No wheezing.  Abdominal:     General: Bowel sounds are normal.     Palpations: Abdomen is soft.  Musculoskeletal:     Cervical back: Neck supple.  Skin:    General: Skin is warm and dry.     Capillary Refill: Capillary refill takes less than 2 seconds.  Neurological:     Mental Status: He is alert and oriented to person, place, and time.  Psychiatric:        Behavior: Behavior normal.        Thought Content: Thought content normal.        Judgment: Judgment normal.  Ortho Exam bilateral 2+ knee effusion.  Medial and lateral joint line tenderness.  Negative logroll of the hips.  Specialty Comments:  No specialty comments available.  Imaging: XR Knee 1-2 Views Left  Result Date: 06/01/2022 AP and lateral left knee obtained and reviewed.  Arterial calcification femoral artery and trifurcation in the leg.  Mild joint space narrowing and spurring consistent with mild OA.  Negative for acute changes. Impression: Mild knee osteoarthritis.  Arterial calcification.  XR Knee 1-2 Views Right  Result Date: 06/01/2022 Tenderness the right knees lateral right knee send most patella demonstrates some joint space narrowing.  Arterial calcification consistent with PAD.  Small marginal osteophytes. Impression: Mild knee arthritis, arterial calcification noted.    PMFS History: Patient Active Problem List    Diagnosis Date Noted   Bilateral primary osteoarthritis of knee 06/01/2022   Non-rheumatic mitral regurgitation 06/18/2020   S/P mitral valve clip implantation 06/18/2020   History of intracranial hemorrhage    OSA (obstructive sleep apnea)    GERD (gastroesophageal reflux disease)    Chronic diastolic heart failure ([place]) 05/24/2020   Persistent atrial fibrillation ([place]) 05/24/2020   Past Medical History:  Diagnosis Date   GERD (gastroesophageal reflux disease)    History of intracranial hemorrhage    OSA (obstructive sleep apnea)    Persistent atrial fibrillation (HCC)    not on oral anticoagulation due to hx of ICH   S/P mitral valve clip 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 Burt Knack   Severe mitral regurgitation     Family History  Problem Relation Age of Onset   Cancer Father     Past Surgical History:  Procedure Laterality Date   CRANIOTOMY     MITRAL VALVE REPAIR N/A 06/18/2020   Procedure: MITRAL VALVE REPAIR;  Surgeon: Sherren Mocha, MD;  Location: [place] [facility];  Service: Cardiovascular;  Laterality: N/A;   RIGHT/LEFT HEART CATH AND CORONARY ANGIOGRAPHY N/A 06/03/2020   Procedure: RIGHT/LEFT HEART CATH AND CORONARY ANGIOGRAPHY;  Surgeon: Burnell Blanks, MD;  Location: [place] [facility];  Service: Cardiovascular;  Laterality: N/A;   SHOULDER SURGERY     TEE WITHOUT CARDIOVERSION N/A 05/22/2020   Procedure: TRANSESOPHAGEAL ECHOCARDIOGRAM (TEE);  Surgeon: Buford Dresser, MD;  Location: [facility] ENDOSCOPY;  Service: Cardiovascular;  Laterality: N/A;   TEE WITHOUT CARDIOVERSION N/A 06/18/2020   Procedure: TRANSESOPHAGEAL ECHOCARDIOGRAM (TEE);  Surgeon: Sherren Mocha, MD;  Location: [place] [facility];  Service: Cardiovascular;  Laterality: N/A;   Social History   Occupational History   Occupation: retired Technical brewer  Tobacco Use   Smoking status: Never   Smokeless  tobacco: Never  Vaping Use   Vaping Use: Never used  Substance and Sexual Activity   Alcohol use: Never   Drug use: Never   Sexual activity: Not on file

## 2022-06-07 ENCOUNTER — Other Ambulatory Visit: Payer: Self-pay | Admitting: Physician Assistant

## 2022-06-08 ENCOUNTER — Encounter: Payer: Self-pay | Admitting: Cardiovascular Disease

## 2022-06-08 ENCOUNTER — Telehealth: Payer: Self-pay

## 2022-06-08 NOTE — Telephone Encounter (Signed)
Spoke with son.  Notified we were waiting to hear back form therapy to get information.  Advised that if his Heart rate dropped that low and fast, would expect a change to BP, or patient would feel SOB, lightheaded, dizzy.  He had none of those. Also advised that the pulse ox may not have been correct reading if it was that quick of a change. I advised until information received that patient should hydrate as he may be dehydrated and to push fluids.  He goes on to say this is what he suspects and this was an issue at last LTC facility.  Advised him to by lightweight cup with handle and disposable straws to keep in patient room with favorite liquid to help encourage hydration.  Advised we will wait for PT to call back and get more information. He is good with this approach.

## 2022-06-08 NOTE — Telephone Encounter (Signed)
Called and spoke with Son (DPR) asked who the physical therapist was and if he had the phone number to get specific numbers and what happened. Son states that "you will not be able to talk to them but it is [facility] at [facility]. They also did move pt to [facility] at [facility].  Tried to call [facility] and speak with pt's nurse, transferred and left detailed message on voicemail. Tried to call [facility] left detailed message to call back to get further details.

## 2022-06-10 ENCOUNTER — Emergency Department (HOSPITAL_COMMUNITY)
Admission: EM | Admit: 2022-06-10 | Discharge: 2022-06-11 | Disposition: A | Discharge status: Home / Self Care | Payer: Medicare HMO | Attending: Emergency Medicine | Admitting: Emergency Medicine

## 2022-06-10 ENCOUNTER — Emergency Department (HOSPITAL_COMMUNITY): Payer: Medicare HMO

## 2022-06-10 ENCOUNTER — Other Ambulatory Visit: Payer: Self-pay

## 2022-06-10 DIAGNOSIS — W19XXXA Unspecified fall, initial encounter: Secondary | ICD-10-CM | POA: Diagnosis not present

## 2022-06-10 DIAGNOSIS — Z7982 Long term (current) use of aspirin: Secondary | ICD-10-CM | POA: Diagnosis not present

## 2022-06-10 DIAGNOSIS — I5032 Chronic diastolic (congestive) heart failure: Secondary | ICD-10-CM | POA: Insufficient documentation

## 2022-06-10 DIAGNOSIS — S8992XA Unspecified injury of left lower leg, initial encounter: Secondary | ICD-10-CM | POA: Diagnosis present

## 2022-06-10 NOTE — ED Provider Notes (Signed)
[facility] Provider Note   CSN: [id number] Arrival date & time: 06/10/22  2213     History  Chief Complaint  Patient presents with   Adrian [name] is a 89 y.o. male with hx of chronic diastolic HF, persistent atrial fibrillation, mitral regurgitation, OSA, and GERD who presents to the ER complaining of left knee pain after mechanical fall. Patient coming from [facility] living facility. He was trying to move from his scooter to his recliner when he lost his balance and fell onto his L knee on the ground. Denies striking his head, and he is not on chronic anticoagulation. Denies pain anywhere else besides the L knee. Follows with orthopedics, got a cortisone injection in that knee last week. Wears a brace on that knee during the days, but wasn't wearing it this evening when he fell.    Fall       Home Medications Prior to Admission medications   Medication Sig Start Date End Date Taking? Authorizing Provider  acetaminophen (TYLENOL) 500 MG tablet Take 500 mg by mouth in the morning, at noon, and at bedtime. (0900, 1300 & 2100)    [provider]  acyclovir (ZOVIRAX) 400 MG tablet Take 400 mg by mouth 3 (three) times daily. 04/22/22   [provider]  amoxicillin (AMOXIL) 500 MG tablet Take 2,000 mg (4 tablets) 1 hour prior to all dental visits. 07/23/20   Eileen Stanford, PA-C  aspirin 81 MG EC tablet Take 81 mg by mouth daily. (0900)    [provider]  Calcium Carb-Cholecalciferol (CALCIUM 500 + D3) 500-200 MG-UNIT TABS Take 1 tablet by mouth daily. (0900)    [provider]  diclofenac Sodium (VOLTAREN) 1 % GEL Apply 2 g topically in the morning and at bedtime. (0900 & 2100)    [provider]  fluticasone (FLONASE) 50 MCG/ACT nasal spray Place 1 spray into both nostrils in the morning and at bedtime. (0900 & 1700)    [provider]  furosemide (LASIX) 20 MG tablet  Take 1 tablet every other day 02/16/22   Deberah Pelton, NP  gabapentin (NEURONTIN) 100 MG capsule Take 100 mg by mouth daily. 12/06/21   [provider]  gabapentin (NEURONTIN) 300 MG capsule Take 300 mg by mouth in the morning and at bedtime. Take 300 mg in the morning and 600 mg at bedtime. 04/12/21   [provider]  guaiFENesin (MUCINEX) 600 MG 12 hr tablet Take 600 mg by mouth 2 (two) times daily. (0900 & 1700)    [provider]  montelukast (SINGULAIR) 10 MG tablet Take 10 mg by mouth at bedtime. 12/02/19   [provider]  Multiple Vitamin (MULTIVITAMIN WITH MINERALS) TABS tablet Take 1 tablet by mouth daily. (0900)    [provider]  Multiple Vitamins-Minerals (PRESERVISION AREDS 2 PO) Take 1 tablet by mouth in the morning and at bedtime. (0900 & 1700)    [provider]  nitroGLYCERIN (NITROSTAT) 0.4 MG SL tablet Place 1 tablet (0.4 mg total) under the tongue every 5 (five) minutes x 3 doses as needed for chest pain. 05/24/20   Duke, Tami Lin, PA  pantoprazole (PROTONIX) 40 MG tablet TAKE 1 TABLET BY MOUTH DAILY. --DX: GERD 06/07/22   O'Neal, Cassie Freer, MD  rivastigmine (EXELON) 3 MG capsule Take 3 mg by mouth 2 (two) times daily. (0900 & 1700) 04/11/20   [provider]  [facility]  500 MG CAPS Take 500 mg by mouth daily at 12 noon. (1200)    [provider]  Skin Protectants, Misc. (BAZA PROTECT EX) Apply 1 application topically in the morning and at bedtime. (0900 & 2100)    [provider]      Allergies    Milk protein    Review of Systems   Review of Systems  Musculoskeletal:  Positive for arthralgias.  All other systems reviewed and are negative.   Physical Exam Updated Vital Signs BP 120/80   Pulse 73   Temp 97.9 F (36.6 C)   Resp 16   Ht 5\' 6"  (1.676 m)   Wt 69.9 kg   SpO2 95%   BMI 24.87 kg/m  Physical Exam Vitals and nursing note reviewed.  Constitutional:       Appearance: Normal appearance.  HENT:     Head: Normocephalic and atraumatic.  Eyes:     Conjunctiva/sclera: Conjunctivae normal.  Cardiovascular:     Pulses:          Posterior tibial pulses are 2+ on the right side and 2+ on the left side.  Pulmonary:     Effort: Pulmonary effort is normal. No respiratory distress.  Musculoskeletal:     Right lower leg: No edema.     Left lower leg: No edema.     Comments: Generalized swelling noted to the left knee, with moderate effusion. Some erythema of the medial knee, no breaks in the skin. Able to range the knee with pain.   No leg length abnormality. Pelvis stable. Compartments of the legs are soft.   Skin:    General: Skin is warm and dry.  Neurological:     Mental Status: He is alert.  Psychiatric:        Mood and Affect: Mood normal.        Behavior: Behavior normal.     ED Results / Procedures / Treatments   Labs (all labs ordered are listed, but only abnormal results are displayed) Labs Reviewed - No data to display  EKG None  Radiology DG Knee Complete 4 Views Left  Result Date: 06/10/2022 CLINICAL DATA:  Fall onto knee. EXAM: LEFT KNEE - COMPLETE 4+ VIEW COMPARISON:  06/01/2022. FINDINGS: No acute fracture or dislocation. Mild-to-moderate degenerative changes are noted in the patellofemoral compartment. There is a trace suprapatellar joint effusion. Chondrocalcinosis is noted. Vascular calcifications are noted in the soft tissues. IMPRESSION: 1. No acute fracture or dislocation. 2. Mild-to-moderate degenerative changes of the knee. 3. Trace suprapatellar joint effusion. Electronically Signed   By: Brett Fairy M.D.   On: 06/10/2022 23:10    Procedures Procedures    Medications Ordered in ED Medications - No data to display  ED Course/ Medical Decision Making/ A&P                             Medical Decision Making Amount and/or Complexity of Data Reviewed Radiology: ordered.   This patient is a 89 y.o. male   who presents to the ED for concern of mechanical fall with left knee injury. No head trauma or LOC. Not on blood thinners.   Past Medical History / Co-morbidities: chronic diastolic HF, persistent atrial fibrillation, mitral regurgitation, OSA, and GERD  Additional history: Chart reviewed. Pertinent results include: Patient follows with Dr  Mercy with orthopedics  Physical Exam: Physical exam performed. The pertinent findings include: Normal vital signs, in no acute distress. Some  medial swelling noted to the left knee. Can range the joint with some pain. Strong distal pulses. Pelvis stable without tenderness, no leg length abnormality.   Lab Tests/Imaging studies: I personally interpreted labs/imaging and the pertinent results include:  x-ray of the left knee with no traumatic findings, small suprapatellar joint effusion. I agree with the radiologist interpretation.  Disposition: After consideration of the diagnostic results and the patients response to treatment, I feel that emergency department workup does not suggest an emergent condition requiring admission or immediate intervention beyond what has been performed at this time. The plan is: discharge to home with symptomatic management of left knee injury. Will follow up with ortho. The patient is safe for discharge and has been instructed to return immediately for worsening symptoms, change in symptoms or any other concerns.  Final Clinical Impression(s) / ED Diagnoses Final diagnoses:  Injury of left knee, initial encounter  Fall, initial encounter    Rx / DC Orders ED Discharge Orders     None      Portions of this report may have been transcribed using voice recognition software. Every effort was made to ensure accuracy; however, inadvertent computerized transcription errors may be present.    Estill Cotta 06/10/22 2327    Lacretia Leigh, MD 06/13/22 [phone]

## 2022-06-10 NOTE — Discharge Instructions (Addendum)
Adrian [name] was seen in the ER for fall with knee injury.  The x-ray we performed of the knee shows no broken or dislocated bones. I recommend continuing to wear the brace during the day, taking over the counter medications as needed for pain, and using an ice pack.  Follow up with the orthopedist. Continue to monitor how you're doing and return to the ER for new or worsening symptoms.

## 2022-06-10 NOTE — ED Triage Notes (Signed)
Chief Complaint  Patient presents with   Fall   Pt presents to ED 28 via EMS from [facility] for above complaint.  Per report, pt was attempted to move from scooter to recliner and had mechanical fall.  Pt hit left knee; endorses 6/10 pain to same.  Pt denies LOC or hitting head.

## 2022-06-11 NOTE — ED Notes (Signed)
Pt/son provided with AVS.  Education complete; all questions answered.  Pt leaving ED in stable condition at this time via wheelchair with all belongings.  Pt discharged with son who will be taking him back to his facility.

## 2022-07-21 ENCOUNTER — Encounter: Payer: Self-pay | Admitting: Cardiovascular Disease

## 2022-11-03 NOTE — Progress Notes (Signed)
Cardiology Office Note:   Date:  11/04/2022  NAME:  Adrian [name]    MRN: [id number] DOB:  [birth year]   PCP:  Almetta Lovely, [facility]  Cardiologist:  Reatha Harps, MD  Electrophysiologist:  None   Referring MD: Almetta Lovely, [facility]*   Chief Complaint  Patient presents with   Follow-up    History of Present Illness:   Adrian [name] is a 89 y.o. male with a hx of HFpEF, persistent atrial fibrillation, OSA, severe MR status post MitraClip presents for follow-up.  He presents with his son.  They report he is doing well.  Working with physical therapy.  Not that active.  Denies chest pain or trouble breathing.  Does have some lower extremity edema.  Takes Lasix every other day.  Kidney function has been stable.  He overall is without complaints today.  He has moved to a new assisted living facility.  Doing well with this.  No issues reported.  Problem List 1. Intracranial hemorrhage -while on coumadin in past 2. HTN 3. Persistent Afib -CHADSVASC=3 (age, HTN) -EF 60-65% -normal MPI 11/2019 ([facility]) 4. OSA 5. Severe mitral regurgitation -P2 flail -mitraclip (06/18/2020)  6. CAD -80% dLAD  Past Medical History: Past Medical History:  Diagnosis Date   GERD (gastroesophageal reflux disease)    History of intracranial hemorrhage    OSA (obstructive sleep apnea)    Persistent atrial fibrillation (HCC)    not on oral anticoagulation due to hx of ICH   S/P mitral valve clip 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 Excell Seltzer   Severe mitral regurgitation     Past Surgical History: Past Surgical History:  Procedure Laterality Date   CRANIOTOMY     MITRAL VALVE REPAIR N/A 06/18/2020   Procedure: MITRAL VALVE REPAIR;  Surgeon: Tonny Bollman, MD;  Location: [facility] INVASIVE CV LAB;  Service: Cardiovascular;  Laterality: N/A;   RIGHT/LEFT HEART CATH AND CORONARY ANGIOGRAPHY N/A 06/03/2020   Procedure:  RIGHT/LEFT HEART CATH AND CORONARY ANGIOGRAPHY;  Surgeon: Kathleene Hazel, MD;  Location: [facility] INVASIVE CV LAB;  Service: Cardiovascular;  Laterality: N/A;   SHOULDER SURGERY     TEE WITHOUT CARDIOVERSION N/A 05/22/2020   Procedure: TRANSESOPHAGEAL ECHOCARDIOGRAM (TEE);  Surgeon: Jodelle Red, MD;  Location: [facility] ENDOSCOPY;  Service: Cardiovascular;  Laterality: N/A;   TEE WITHOUT CARDIOVERSION N/A 06/18/2020   Procedure: TRANSESOPHAGEAL ECHOCARDIOGRAM (TEE);  Surgeon: Tonny Bollman, MD;  Location: [facility] INVASIVE CV LAB;  Service: Cardiovascular;  Laterality: N/A;    Current Medications: Current Meds  Medication Sig   acetaminophen (TYLENOL) 500 MG tablet Take 500 mg by mouth in the morning, at noon, and at bedtime. (0900, 1300 & 2100)   amoxicillin (AMOXIL) 500 MG tablet Take 2,000 mg (4 tablets) 1 hour prior to all dental visits.   aspirin 81 MG EC tablet Take 81 mg by mouth daily. (0900)   Calcium Carb-Cholecalciferol (CALCIUM 500 + D3) 500-200 MG-UNIT TABS Take 1 tablet by mouth daily. (0900)   diclofenac Sodium (VOLTAREN) 1 % GEL Apply 2 g topically in the morning and at bedtime. (0900 & 2100)   fluticasone (FLONASE) 50 MCG/ACT nasal spray Place 1 spray into both nostrils in the morning and at bedtime. (0900 & 1700)   furosemide (LASIX) 20 MG tablet Take 1 tablet every other day   gabapentin (NEURONTIN) 300 MG capsule Take 300 mg by mouth in the morning and at bedtime. Take 300  mg in the morning and 600 mg at bedtime.   guaiFENesin (MUCINEX) 600 MG 12 hr tablet Take 600 mg by mouth 2 (two) times daily. (0900 & 1700)   montelukast (SINGULAIR) 10 MG tablet Take 10 mg by mouth at bedtime.   Multiple Vitamin (MULTIVITAMIN WITH MINERALS) TABS tablet Take 1 tablet by mouth daily. (0900)   Multiple Vitamins-Minerals (PRESERVISION AREDS 2 PO) Take 1 tablet by mouth in the morning and at bedtime. (0900 & 1700)   nitroGLYCERIN (NITROSTAT) 0.4 MG SL tablet Place 1 tablet (0.4 mg total)  under the tongue every 5 (five) minutes x 3 doses as needed for chest pain.   pantoprazole (PROTONIX) 40 MG tablet TAKE 1 TABLET BY MOUTH DAILY. --DX: GERD   rivastigmine (EXELON) 3 MG capsule Take 3 mg by mouth 2 (two) times daily. (0900 & 1700)   Saw Palmetto 500 MG CAPS Take 500 mg by mouth daily at 12 noon. (1200)   Skin Protectants, Misc. (BAZA PROTECT EX) Apply 1 application topically in the morning and at bedtime. (0900 & 2100)     Allergies:    Milk protein   Social History: Social History   Socioeconomic History   Marital status: Widowed    Spouse name: Not on file   Number of children: 7   Years of education: Not on file   Highest education level: Not on file  Occupational History   Occupation: retired Immunologist  Tobacco Use   Smoking status: Never   Smokeless tobacco: Never  Vaping Use   Vaping status: Never Used  Substance and Sexual Activity   Alcohol use: Never   Drug use: Never   Sexual activity: Not on file  Other Topics Concern   Not on file  Social History Narrative   Not on file   Social Determinants of Health   Financial Resource Strain: Not on file  Food Insecurity: Not on file  Transportation Needs: Not on file  Physical Activity: Not on file  Stress: Not on file  Social Connections: Unknown (07/27/2021)   Received from [facility]   Social Network    Social Network: Not on file     Family History: The patient's family history includes Cancer in his father.  ROS:   All other ROS reviewed and negative. Pertinent positives noted in the HPI.     EKGs/Labs/Other Studies Reviewed:   The following studies were personally reviewed by me today:  EKG:  EKG is not ordered today.        Recent Labs: 02/08/2022: ALT 12; BUN 34; Creatinine, Ser 1.35; Potassium 4.4; Sodium 138 05/03/2022: BNP 319.6; Hemoglobin 12.0; Platelets 214; TSH 3.590   Recent Lipid Panel No results found for: "CHOL", "TRIG", "HDL", "CHOLHDL", "VLDL",  "LDLCALC", "LDLDIRECT"  Physical Exam:   VS:  BP 128/84   Pulse 62   Ht 5\' 6"  (1.676 m)   Wt 150 lb (68 kg)   SpO2 94%   BMI 24.21 kg/m    Wt Readings from Last 3 Encounters:  11/04/22 150 lb (68 kg)  06/10/22 154 lb 1.6 oz (69.9 kg)  06/01/22 154 lb (69.9 kg)    General: Well nourished, well developed, in no acute distress Head: Atraumatic, normal size  Eyes: PEERLA, EOMI  Neck: Supple, no JVD Endocrine: No thryomegaly Cardiac: Normal S1, S2; irregular heart rate, no murmurs Lungs: Clear to auscultation bilaterally, no wheezing, rhonchi or rales  Abd: Soft, nontender, no hepatomegaly  Ext: 1+ pitting edema Musculoskeletal:  No deformities, BUE and BLE strength normal and equal Skin: Warm and dry, no rashes   Neuro: Alert and oriented to person, place, time, and situation, CNII-XII grossly intact, no focal deficits  Psych: Normal mood and affect   ASSESSMENT:   Alfie Everts is a 88 y.o. male who presents for the following: 1. Chronic diastolic heart failure (HCC)   2. Severe mitral insufficiency   3. S/P mitral valve clip implantation   4. Primary hypertension   5. Persistent atrial fibrillation (HCC)     PLAN:   1. Chronic diastolic heart failure (HCC) -Overall fairly euvolemic on exam.  Continue Lasix every other day.  2. Severe mitral insufficiency 3. S/P mitral valve clip implantation -Status post mitral valve clip.  Had appropriate reduction in MR.  Continue aspirin.  Understands he needs antibiotics before dental work.  Most recent echo in December stable.  4. Primary hypertension -No change to current medications.  5. Persistent atrial fibrillation (HCC) -Rate controlled on no medications.  Not on anticoagulation due to prior history of intracerebral hemorrhage.      Disposition: Return in about 6 months (around 05/07/2023).  Medication Adjustments/Labs and Tests Ordered: Current medicines are reviewed at length with the patient today.  Concerns  regarding medicines are outlined above.  No orders of the defined types were placed in this encounter.  No orders of the defined types were placed in this encounter.  Patient Instructions  Medication Instructions:  Continue same medications *If you need a refill on your cardiac medications before your next appointment, please call your pharmacy*   Lab Work: None ordered   Testing/Procedures: None ordered   Follow-Up: At [facility], you and your health needs are our priority.  As part of our continuing mission to provide you with exceptional heart care, we have created designated Provider Care Teams.  These Care Teams include your primary Cardiologist (physician) and Advanced Practice Providers (APPs -  Physician Assistants and Nurse Practitioners) who all work together to provide you with the care you need, when you need it.  We recommend signing up for the patient portal called "MyChart".  Sign up information is provided on this After Visit Summary.  MyChart is used to connect with patients for Virtual Visits (Telemedicine).  Patients are able to view lab/test results, encounter notes, upcoming appointments, etc.  Non-urgent messages can be sent to your provider as well.   To learn more about what you can do with MyChart, go to [email or web address].    Your next appointment:  1 year   Provider:  Dr.O'Neal  Schedule appointment with PA in 6 months    Call in Oct to schedule Feb appointment     Time Spent with Patient: I have spent a total of 25 minutes with patient reviewing hospital notes, telemetry, EKGs, labs and examining the patient as well as establishing an assessment and plan that was discussed with the patient.  > 50% of time was spent in direct patient care.  Signed, Lenna Gilford. Flora Lipps, MD, [facility]  [facility]  [address], [address] [place], [place] [address] [phone]  11/04/2022 5:25 PM

## 2022-11-04 ENCOUNTER — Encounter: Payer: Self-pay | Admitting: Cardiovascular Disease

## 2022-11-04 ENCOUNTER — Ambulatory Visit: Payer: Medicare HMO | Attending: Cardiovascular Disease | Admitting: Cardiovascular Disease

## 2022-11-04 VITALS — BP 128/84 | HR 62 | Ht 66.0 in | Wt 150.0 lb

## 2022-11-04 DIAGNOSIS — I5032 Chronic diastolic (congestive) heart failure: Secondary | ICD-10-CM | POA: Diagnosis not present

## 2022-11-04 DIAGNOSIS — I10 Essential (primary) hypertension: Secondary | ICD-10-CM

## 2022-11-04 DIAGNOSIS — Z9889 Other specified postprocedural states: Secondary | ICD-10-CM

## 2022-11-04 DIAGNOSIS — I4819 Other persistent atrial fibrillation: Secondary | ICD-10-CM

## 2022-11-04 DIAGNOSIS — Z95818 Presence of other cardiac implants and grafts: Secondary | ICD-10-CM

## 2022-11-04 DIAGNOSIS — I340 Nonrheumatic mitral (valve) insufficiency: Secondary | ICD-10-CM

## 2022-11-04 NOTE — Patient Instructions (Signed)
Medication Instructions:  Continue same medications *If you need a refill on your cardiac medications before your next appointment, please call your pharmacy*   Lab Work: None ordered   Testing/Procedures: None ordered   Follow-Up: At [facility], you and your health needs are our priority.  As part of our continuing mission to provide you with exceptional heart care, we have created designated Provider Care Teams.  These Care Teams include your primary Cardiologist (physician) and Advanced Practice Providers (APPs -  Physician Assistants and Nurse Practitioners) who all work together to provide you with the care you need, when you need it.  We recommend signing up for the patient portal called "MyChart".  Sign up information is provided on this After Visit Summary.  MyChart is used to connect with patients for Virtual Visits (Telemedicine).  Patients are able to view lab/test results, encounter notes, upcoming appointments, etc.  Non-urgent messages can be sent to your provider as well.   To learn more about what you can do with MyChart, go to [email or web address].    Your next appointment:  1 year   Provider:  Dr.O'Neal  Schedule appointment with PA in 6 months    Call in Oct to schedule Feb appointment

## 2023-01-07 ENCOUNTER — Other Ambulatory Visit: Payer: Self-pay

## 2023-01-07 ENCOUNTER — Observation Stay (HOSPITAL_COMMUNITY)
Admission: EM | Admit: 2023-01-07 | Discharge: 2023-01-10 | Disposition: A | Discharge status: Skilled Nursing Facility | Payer: Medicare HMO | Attending: Family Medicine | Admitting: Family Medicine

## 2023-01-07 ENCOUNTER — Emergency Department (HOSPITAL_COMMUNITY): Payer: Medicare HMO

## 2023-01-07 ENCOUNTER — Encounter (HOSPITAL_COMMUNITY): Payer: Self-pay | Admitting: Family Medicine

## 2023-01-07 DIAGNOSIS — R2681 Unsteadiness on feet: Secondary | ICD-10-CM | POA: Insufficient documentation

## 2023-01-07 DIAGNOSIS — I4891 Unspecified atrial fibrillation: Secondary | ICD-10-CM | POA: Insufficient documentation

## 2023-01-07 DIAGNOSIS — G4731 Primary central sleep apnea: Secondary | ICD-10-CM | POA: Diagnosis not present

## 2023-01-07 DIAGNOSIS — W19XXXA Unspecified fall, initial encounter: Secondary | ICD-10-CM | POA: Insufficient documentation

## 2023-01-07 DIAGNOSIS — N179 Acute kidney failure, unspecified: Secondary | ICD-10-CM | POA: Diagnosis not present

## 2023-01-07 DIAGNOSIS — Z9181 History of falling: Secondary | ICD-10-CM | POA: Insufficient documentation

## 2023-01-07 DIAGNOSIS — I5032 Chronic diastolic (congestive) heart failure: Secondary | ICD-10-CM | POA: Diagnosis not present

## 2023-01-07 DIAGNOSIS — D509 Iron deficiency anemia, unspecified: Secondary | ICD-10-CM | POA: Diagnosis not present

## 2023-01-07 DIAGNOSIS — M6281 Muscle weakness (generalized): Secondary | ICD-10-CM | POA: Diagnosis not present

## 2023-01-07 DIAGNOSIS — S12090A Other displaced fracture of first cervical vertebra, initial encounter for closed fracture: Secondary | ICD-10-CM | POA: Diagnosis present

## 2023-01-07 DIAGNOSIS — D649 Anemia, unspecified: Principal | ICD-10-CM

## 2023-01-07 DIAGNOSIS — Z7982 Long term (current) use of aspirin: Secondary | ICD-10-CM | POA: Insufficient documentation

## 2023-01-07 LAB — BASIC METABOLIC PANEL
Anion gap: 10 (ref 5–15)
BUN: 64 mg/dL — ABNORMAL HIGH (ref 8–23)
CO2: 15 mmol/L — ABNORMAL LOW (ref 22–32)
Calcium: 8.7 mg/dL — ABNORMAL LOW (ref 8.9–10.3)
Chloride: 114 mmol/L — ABNORMAL HIGH (ref 98–111)
Creatinine, Ser: 2 mg/dL — ABNORMAL HIGH (ref 0.61–1.24)
GFR, Estimated: 31 mL/min — ABNORMAL LOW (ref >60)
Glucose, Bld: 109 mg/dL — ABNORMAL HIGH (ref 70–99)
Potassium: 4 mmol/L (ref 3.5–5.1)
Sodium: 139 mmol/L (ref 135–145)

## 2023-01-07 LAB — FERRITIN: Ferritin: 14 ng/mL — ABNORMAL LOW (ref 24–336)

## 2023-01-07 LAB — CBC
HCT: 23.7 % — ABNORMAL LOW (ref 39.0–52.0)
Hemoglobin: 6.9 g/dL — CL (ref 13.0–17.0)
MCH: 27.3 pg (ref 26.0–34.0)
MCHC: 29.1 g/dL — ABNORMAL LOW (ref 30.0–36.0)
MCV: 93.7 fL (ref 80.0–100.0)
Platelets: 267 10*3/uL (ref 150–400)
RBC: 2.53 MIL/uL — ABNORMAL LOW (ref 4.22–5.81)
RDW: 15.3 % (ref 11.5–15.5)
WBC: 7.2 10*3/uL (ref 4.0–10.5)
nRBC: 0 % (ref 0.0–0.2)

## 2023-01-07 LAB — IRON AND TIBC
Iron: 20 ug/dL — ABNORMAL LOW (ref 45–182)
Saturation Ratios: 5 % — ABNORMAL LOW (ref 17.9–39.5)
TIBC: 396 ug/dL (ref 250–450)
UIBC: 376 ug/dL

## 2023-01-07 LAB — POC OCCULT BLOOD, ED: Fecal Occult Bld: NEGATIVE

## 2023-01-07 LAB — HEMOGLOBIN AND HEMATOCRIT, BLOOD
HCT: 27.4 % — ABNORMAL LOW (ref 39.0–52.0)
Hemoglobin: 8.8 g/dL — ABNORMAL LOW (ref 13.0–17.0)

## 2023-01-07 LAB — FOLATE: Folate: >40 ng/mL (ref >5.9)

## 2023-01-07 LAB — VITAMIN B12: Vitamin B-12: 368 pg/mL (ref 180–914)

## 2023-01-07 LAB — PREPARE RBC (CROSSMATCH)

## 2023-01-07 MED ORDER — GABAPENTIN 300 MG PO CAPS
300.0000 mg | ORAL_CAPSULE | Freq: Every morning | ORAL | Status: DC
  Administered 2023-01-08 – 2023-01-10 (×3): 300 mg via ORAL
  Filled 2023-01-07 (×3): qty 1

## 2023-01-07 MED ORDER — TRAMADOL HCL 50 MG PO TABS
25.0000 mg | ORAL_TABLET | Freq: Two times a day (BID) | ORAL | Status: DC | PRN
  Administered 2023-01-08: 25 mg via ORAL
  Filled 2023-01-07: qty 1

## 2023-01-07 MED ORDER — GUAIFENESIN ER 600 MG PO TB12
600.0000 mg | ORAL_TABLET | Freq: Two times a day (BID) | ORAL | Status: DC
  Administered 2023-01-07 – 2023-01-10 (×6): 600 mg via ORAL
  Filled 2023-01-07 (×6): qty 1

## 2023-01-07 MED ORDER — GABAPENTIN 300 MG PO CAPS: 300.0000 mg | ORAL_CAPSULE | ORAL | Status: DC

## 2023-01-07 MED ORDER — ACETAMINOPHEN 325 MG PO TABS
650.0000 mg | ORAL_TABLET | Freq: Four times a day (QID) | ORAL | Status: DC | PRN
  Administered 2023-01-09: 650 mg via ORAL
  Filled 2023-01-07: qty 2

## 2023-01-07 MED ORDER — SODIUM CHLORIDE 0.9 % IV BOLUS
500.0000 mL | Freq: Once | INTRAVENOUS | Status: AC
  Administered 2023-01-07: 500 mL via INTRAVENOUS

## 2023-01-07 MED ORDER — MONTELUKAST SODIUM 10 MG PO TABS
10.0000 mg | ORAL_TABLET | Freq: Every day | ORAL | Status: DC
  Administered 2023-01-07 – 2023-01-10 (×4): 10 mg via ORAL
  Filled 2023-01-07 (×4): qty 1

## 2023-01-07 MED ORDER — SODIUM CHLORIDE 0.9% IV SOLUTION: Freq: Once | INTRAVENOUS | Status: AC

## 2023-01-07 MED ORDER — RIVASTIGMINE TARTRATE 1.5 MG PO CAPS
3.0000 mg | ORAL_CAPSULE | Freq: Two times a day (BID) | ORAL | Status: DC
  Administered 2023-01-07 – 2023-01-10 (×6): 3 mg via ORAL
  Filled 2023-01-07 (×7): qty 2

## 2023-01-07 MED ORDER — PANTOPRAZOLE SODIUM 40 MG PO TBEC
40.0000 mg | DELAYED_RELEASE_TABLET | Freq: Every day | ORAL | Status: DC
  Administered 2023-01-08 – 2023-01-10 (×3): 40 mg via ORAL
  Filled 2023-01-07 (×3): qty 1

## 2023-01-07 MED ORDER — GABAPENTIN 300 MG PO CAPS
600.0000 mg | ORAL_CAPSULE | Freq: Every day | ORAL | Status: DC
  Administered 2023-01-07 – 2023-01-09 (×3): 600 mg via ORAL
  Filled 2023-01-07 (×3): qty 2

## 2023-01-07 NOTE — Hospital Course (Addendum)
 Adrian [name] is a 89 y.o. male with a past medical history of A-fib, mitral regurg status post mitral valve clipping, OSA, history of intracranial hemorrhage on Coumadin, GERD who was admitted to the [facility] Medicine Teaching Service at [facility] for acute anemia found after a fall. Hospital course is outlined below by problem.   Anemia Patient found to have severe iron deficiency anemia, requiring a unit of PRBCs on admission.  Hemoglobin 6.9 initially with a ferritin of 14, a stat ratio 5, and an iron of 20. With shared decision making, did not want to move forward with colonoscopy or invasive procedures to characterize the source of the bleed. CT abdomen/pelvis angio negative for acute bleed. His Hgb was stable to 9.4 on discharge.  AKI  Urinary Retention  Initially, the patient was found to have a creatinine of 2 that seem to be prerenal in nature due to dehydration.  A bolus of 500 mL of which improved his creatinine overall to 1.5. Patient had multiple I/Os after retaining urine, required foley placement ultimately. At discharge he was tolerating the foley cath and flomax . Will have void trial at ALF.  Fall Patient had a fall when he was trying to transfer from wheelchair to bed likely secondary to deconditioning and weakness from anemia that was found while in the ED.  CT head was negative. Worked with physical and occupational therapy prior to disposition.   Occult C1 fracture Chronic compression fracture of C1 seen on CT C-spine with longstanding neck pain.  Neurosurgery recommended soft c-collar without need for surgical intervention.   Other conditions that were chronic and stable.  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 Foley care and voiding trial needed at ALF Aspirin  held due to concern for chronic bleed, family declined further  workup. Have risk/benefit discussion in outpatient setting Weight remained stable - held Lasix  inpatient, consider resuming at discharge,

## 2023-01-07 NOTE — ED Triage Notes (Signed)
PT BIB EMS from [facility]. Mechanical fall from wheelchair to recliner. Not on thinners. confused at baseline. No complaints of pain but unknown head injury possible head abrasions from fall. Controlled Afib w/ EMS. 110/70  CBG 140 95% RA

## 2023-01-07 NOTE — Progress Notes (Signed)
Orthopedic Tech Progress Note Patient Details:  Adrian [name] November 20-Jun-1927 [id number]  Ortho Devices Type of Ortho Device: Soft collar Ortho Device/Splint Interventions: Ordered, Application, Adjustment   [name] Interventions Patient Tolerated: Well Instructions Provided: Care of device, Adjustment of device  Adrian [name] 01/07/2023, 8:23 PM

## 2023-01-07 NOTE — ED Notes (Signed)
Notified provider of hemoglobin critical value.

## 2023-01-07 NOTE — ED Provider Notes (Signed)
[facility] EMERGENCY DEPARTMENT AT [facility] Provider Note   CSN: [id number] Arrival date & time: 01/07/23  0945     History  Chief Complaint  Patient presents with   Fall    No thinners.    Cevon Ostergard is a 89 y.o. male.   Fall     Patient has a history of acid reflux severe mitral regurg chronic atrial fibrillation gait instability requiring wheelchair use.  Patient states he was attempting to get out of his wheelchair into a recliner when the wheelchair moved and the patient excellently fell.  Patient thinks he did hit his head.  He did not lose consciousness.  He denies any pain in his extremities, his back his chest or abdomen.  Patient denies any significant pain or discomfort at all with his fall.  Patient was sent to the ED for evaluation.  He denies any fevers or chills.  He does not feel ill.  No shortness of breath.  No numbness or weakness  Home Medications Prior to Admission medications   Medication Sig Start Date End Date Taking? Authorizing Provider  acetaminophen (TYLENOL) 500 MG tablet Take 500 mg by mouth in the morning, at noon, and at bedtime. (0900, 1300 & 2100)    [provider]  amoxicillin (AMOXIL) 500 MG tablet Take 2,000 mg (4 tablets) 1 hour prior to all dental visits. 07/23/20   Janetta Hora, PA-C  aspirin 81 MG EC tablet Take 81 mg by mouth daily. (0900)    [provider]  Calcium Carb-Cholecalciferol (CALCIUM 500 + D3) 500-200 MG-UNIT TABS Take 1 tablet by mouth daily. (0900)    [provider]  diclofenac Sodium (VOLTAREN) 1 % GEL Apply 2 g topically in the morning and at bedtime. (0900 & 2100)    [provider]  fluticasone (FLONASE) 50 MCG/ACT nasal spray Place 1 spray into both nostrils in the morning and at bedtime. (0900 & 1700)    [provider]  furosemide (LASIX) 20 MG tablet Take 1 tablet every other day 02/16/22   Ronney Asters, NP  gabapentin (NEURONTIN) 300 MG capsule  Take 300 mg by mouth in the morning and at bedtime. Take 300 mg in the morning and 600 mg at bedtime. 04/12/21   [provider]  guaiFENesin (MUCINEX) 600 MG 12 hr tablet Take 600 mg by mouth 2 (two) times daily. (0900 & 1700)    [provider]  montelukast (SINGULAIR) 10 MG tablet Take 10 mg by mouth at bedtime. 12/02/19   [provider]  Multiple Vitamin (MULTIVITAMIN WITH MINERALS) TABS tablet Take 1 tablet by mouth daily. (0900)    [provider]  Multiple Vitamins-Minerals (PRESERVISION AREDS 2 PO) Take 1 tablet by mouth in the morning and at bedtime. (0900 & 1700)    [provider]  nitroGLYCERIN (NITROSTAT) 0.4 MG SL tablet Place 1 tablet (0.4 mg total) under the tongue every 5 (five) minutes x 3 doses as needed for chest pain. 05/24/20   [facility], Roe Rutherford, PA  pantoprazole (PROTONIX) 40 MG tablet TAKE 1 TABLET BY MOUTH DAILY. --DX: GERD 06/07/22   O'Neal, Ronnald Ramp, MD  rivastigmine (EXELON) 3 MG capsule Take 3 mg by mouth 2 (two) times daily. (0900 & 1700) 04/11/20   [provider]  Saw Palmetto 500 MG CAPS Take 500 mg by mouth daily at 12 noon. (1200)    [provider]  Skin Protectants, Misc. (BAZA PROTECT EX) Apply 1 application  topically in the morning and at bedtime. (0900 & 2100)    [provider]      Allergies    Milk protein    Review of Systems   Review of Systems  Physical Exam Updated Vital Signs BP 123/75   Pulse 83   Temp 97.8 F (36.6 C) (Oral)   Resp 18   SpO2 96%  Physical Exam Vitals and nursing note reviewed.  Constitutional:      Appearance: He is well-developed.     Comments: Elderly, frail  HENT:     Head: Normocephalic.     Comments: Small abrasion noted on the head    Right Ear: External ear normal.     Left Ear: External ear normal.  Eyes:     General: No scleral icterus.       Right eye: No discharge.        Left eye: No discharge.     Conjunctiva/sclera:  Conjunctivae normal.  Neck:     Trachea: No tracheal deviation.  Cardiovascular:     Rate and Rhythm: Normal rate and regular rhythm.  Pulmonary:     Effort: Pulmonary effort is normal. No respiratory distress.     Breath sounds: Normal breath sounds. No stridor. No wheezing or rales.  Abdominal:     General: Bowel sounds are normal. There is no distension.     Palpations: Abdomen is soft.     Tenderness: There is no abdominal tenderness. There is no guarding or rebound.  Genitourinary:    Comments: No melena or gross blood noted on rectal exam Musculoskeletal:        General: No tenderness or deformity.     Cervical back: Neck supple.  Skin:    General: Skin is warm and dry.     Coloration: Skin is pale.     Findings: No rash.  Neurological:     General: No focal deficit present.     Mental Status: He is alert. Mental status is at baseline.     Cranial Nerves: No cranial nerve deficit, dysarthria or facial asymmetry.     Sensory: No sensory deficit.     Motor: No abnormal muscle tone or seizure activity.     Coordination: Coordination normal.     Comments: Patient able to squeeze my hands bilaterally, able to move his feet, normal sensation throughout follows commands answers questions appropriately  Psychiatric:        Mood and Affect: Mood normal.     ED Results / Procedures / Treatments   Labs (all labs ordered are listed, but only abnormal results are displayed) Labs Reviewed  CBC - Abnormal; Notable for the following components:      Result Value   RBC 2.53 (*)    Hemoglobin 6.9 (*)    HCT 23.7 (*)    MCHC 29.1 (*)    All other components within normal limits  BASIC METABOLIC PANEL - Abnormal; Notable for the following components:   Chloride 114 (*)    CO2 15 (*)    Glucose, Bld 109 (*)    BUN 64 (*)    Creatinine, Ser 2.00 (*)    Calcium 8.7 (*)    GFR, Estimated 31 (*)    All other components within normal limits  POC OCCULT BLOOD, ED  TYPE AND SCREEN   PREPARE RBC (CROSSMATCH)    EKG EKG Interpretation Date/Time:  Saturday January 07 2023 09:56:52 EDT Ventricular Rate:  78 PR Interval:  QRS Duration:  100 QT Interval:  396 QTC Calculation: 452 R Axis:   14  Text Interpretation: Atrial fibrillation Anteroseptal infarct, age indeterminate No significant change since last tracing Confirmed by Linwood Dibbles [phone]) on 01/07/2023 10:04:25 AM  Radiology CT Cervical Spine Wo Contrast  Result Date: 01/07/2023 CLINICAL DATA:  89 year old male status post fall from wheelchair. EXAM: CT CERVICAL SPINE WITHOUT CONTRAST TECHNIQUE: Multidetector CT imaging of the cervical spine was performed without intravenous contrast. Multiplanar CT image reconstructions were also generated. RADIATION DOSE REDUCTION: This exam was performed according to the departmental dose-optimization program which includes automated exposure control, adjustment of the mA and/or kV according to patient size and/or use of iterative reconstruction technique. COMPARISON:  CT head today.  Previous CT cervical spine 02/06/2022. FINDINGS: Alignment: Reversal of the normal cervical lordosis not significantly changed from last year. Chronic anterolisthesis of C3 on C4 and to a lesser extent C4 on C5, C7 on T1. Stable bilateral posterior element alignment. Skull base and vertebrae: Stable craniocervical junction alignment. Mild basilar invagination in the setting of un healed chronic C1 ring fracture with lateral displacement on the C2 articular pillars. C2 appears stable and intact. Chronic degenerative appearing C2-C3 facet ankylosis. Chronic degenerative appearing C4-C5 ankylosis. No acute osseous abnormality identified. Soft tissues and spinal canal: No prevertebral fluid or swelling. No visible canal hematoma. Stable visible noncontrast neck soft tissues, bulky chronic left cervical carotid calcified atherosclerosis. Disc levels: Chronic cervicomedullary junction stenosis suspected in part  due to bulky ligamentous hypertrophy about the odontoid. Probably no other significant cervical spinal stenosis despite degenerative changes. Upper chest: Visible upper thoracic levels appear intact. Partially calcified apical lung scarring. Calcified great vessel atherosclerosis. IMPRESSION: 1. No acute traumatic injury identified in the cervical spine. 2. 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 3. Other advanced cervical spine degeneration superimposed on chronic C2-C3 and C4-C5 ankylosis, but probably no other significant cervical spinal stenosis. 4. Calcified atherosclerosis. Electronically Signed   By: Odessa Fleming M.D.   On: 01/07/2023 12:17   CT Head Wo Contrast  Result Date: 01/07/2023 CLINICAL DATA:  89 year old male status post fall from wheelchair. EXAM: CT HEAD WITHOUT CONTRAST TECHNIQUE: Contiguous axial images were obtained from the base of the skull through the vertex without intravenous contrast. RADIATION DOSE REDUCTION: This exam was performed according to the departmental dose-optimization program which includes automated exposure control, adjustment of the mA and/or kV according to patient size and/or use of iterative reconstruction technique. COMPARISON:  Head CT 02/08/2022 and earlier. FINDINGS: Brain: Stable cerebral volume. No midline shift, mass effect, or evidence of intracranial mass lesion. No ventriculomegaly. No acute intracranial hemorrhage identified. Stable gray-white matter differentiation throughout the brain. No cortically based acute infarct identified. Vascular: Calcified atherosclerosis at the skull base. No suspicious intracranial vascular hyperdensity. Skull: Multiple chronic bilateral burr holes are stable. Un healed chronic C1 ring fracture. No acute osseous abnormality identified. Sinuses/Orbits: Visualized paranasal sinuses and mastoids are stable and well aerated. Other: No acute orbit or scalp  soft tissue finding. IMPRESSION: 1. No acute intracranial abnormality or acute traumatic injury identified. 2. Un-healed chronic C1 ring fracture. CT cervical spine reported separately. Electronically Signed   By: Odessa Fleming M.D.   On: 01/07/2023 12:12    Procedures .Critical Care  Performed by: Linwood Dibbles, MD Authorized by: Linwood Dibbles, MD   Critical care provider statement:    Critical care time (minutes):  30   Critical care was time spent  personally by me on the following activities:  Development of treatment plan with patient or surrogate, discussions with consultants, evaluation of patient's response to treatment, examination of patient, ordering and review of laboratory studies, ordering and review of radiographic studies, ordering and performing treatments and interventions, pulse oximetry, re-evaluation of patient's condition and review of old charts     Medications Ordered in ED Medications  sodium chloride 0.9 % bolus 500 mL (has no administration in time range)  0.9 %  sodium chloride infusion (Manually program via Guardrails IV Fluids) (has no administration in time range)    ED Course/ Medical Decision Making/ A&P Clinical Course as of 01/07/23 1258  Sat Jan 07, 2023  1130 CBC(!!) Hemoglobin still 6.9 [JK]  1130 Basic metabolic panel(!) BUN and creatinine elevated, bicarb decreased [JK]  1221 Head CT and C-spine CT without acute injury [JK]  1222 Chronic unhealed C1 spine fracture noted [JK]  1258 Case discussed with family medicine service regarding admission [JK]    Clinical Course User Index [JK] Linwood Dibbles, MD                                 Medical Decision Making Problems Addressed: Acute anemia: acute illness or injury that poses a threat to life or bodily functions AKI (acute kidney injury) [facility]): acute illness or injury that poses a threat to life or bodily functions  Amount and/or Complexity of Data Reviewed Labs: ordered. Decision-making details  documented in ED Course. Radiology: ordered and independent interpretation performed.  Risk Prescription drug management. Decision regarding hospitalization.   Patient presented to the ED for evaluation of a mechanical fall.  Patient however noted to be pale on exam.  Laboratory testing was performed.  Patient has acute anemia with a drop in his hemoglobin compared to previous values.  He also has elevation of his BUN and creatinine consistent with acute kidney injury.  None anion gap metabolic acidosis noted.  Patient has remained hemodynamically stable.  I have ordered a blood transfusion as well as IV fluid bolus.  Patient is DNR.  I did discuss with family including his power of attorney.  They are okay with fluids and blood transfusion at this time.  Patient would not want any artificial life support measures.  They are not sure right now about intervention such as colonoscopy or endoscopy.  Will plan on admission to the hospital for blood transfusion and further evaluation        Final Clinical Impression(s) / ED Diagnoses Final diagnoses:  Acute anemia  AKI (acute kidney injury) (HCC)  Fall, initial encounter    Rx / DC Orders ED Discharge Orders     None         Linwood Dibbles, MD 01/07/23 1258

## 2023-01-07 NOTE — Progress Notes (Signed)
FMTS Interim Progress Note  S: Patient assessed at bedside, resting comfortably with CPAP in place.  Patient aroused to light touch, oriented to self but unable to name place or time.  Denied pain. Patient reiterated he would like additional blood transfusions if needed.  No family at bedside.  O: BP 117/65 (BP Location: Left Arm)   Pulse 89   Temp 98 F (36.7 C)   Resp (!) 24   SpO2 97%    General: Elderly male resting with CPAP in place.  Soft c-collar in place. No acute distress CV: Irregularly irregular rhythm.  Rate controlled. Pulm: CTAB. Normal WOB on CPAP. No wheezing Abdomen: Soft, non-distended. +BS  A/P: Anemia Unclear source for anemia.  FOBT negative, family still considering GI workup for further evaluation. Anemia workup consistent with iron deficiency anemia.  -Post H&H still pending, will follow-up with additional transfusion as needed -Transfusion threshold <8  Remainder of plan per day team  Elberta Fortis, MD 01/07/2023, 10:22 PM PGY-2, [facility] pager [phone]

## 2023-01-07 NOTE — ED Notes (Signed)
ED TO INPATIENT HANDOFF REPORT  ED Nurse Name and Phone #: Ave Filter B./Latoya M. [phone]  S Name/Age/Gender Adrian [name] 89 y.o. male Room/Bed: 028C/028C  Code Status   Code Status: Limited: Do not attempt resuscitation (DNR) -DNR-LIMITED -Do Not Intubate/DNI   Home/SNF/Other Skilled nursing facility Patient oriented to: self, place, and situation Is this baseline? Yes   Triage Complete: Triage complete  Chief Complaint Normocytic anemia [D64.9]  Triage Note [facility]. Mechanical fall from wheelchair to recliner. Not on thinners. confused at baseline. No complaints of pain but unknown head injury possible head abrasions from fall. Controlled Afib w/ EMS. 110/70  CBG 140 95% RA   Allergies Allergies  Allergen Reactions   Milk Protein Other (See Comments)    Causes runny nose and sneezing     Level of Care/Admitting Diagnosis ED Disposition     ED Disposition  Admit   Condition  --   Comment  Hospital Area: [facility] [[id number]]  Level of Care: Telemetry Medical [104]  May place patient in observation at [facility] or [facility] if equivalent level of care is available:: No  Covid Evaluation: Asymptomatic - no recent exposure (last 10 days) testing not required  Diagnosis: Normocytic anemia [208209]  Admitting Physician: Gerrit Heck [[id number]]  Attending Physician: Billey Co [[id number]]          B Medical/Surgery History Past Medical History:  Diagnosis Date   GERD (gastroesophageal reflux disease)    History of intracranial hemorrhage    OSA (obstructive sleep apnea)    Persistent atrial fibrillation (HCC)    not on oral anticoagulation due to hx of ICH   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 Excell Seltzer   Severe mitral regurgitation    Past Surgical History:  Procedure Laterality Date    CRANIOTOMY     RIGHT/LEFT HEART CATH AND CORONARY ANGIOGRAPHY N/A 06/03/2020   Procedure: RIGHT/LEFT HEART CATH AND CORONARY ANGIOGRAPHY;  Surgeon: Kathleene Hazel, MD;  Location: [facility] INVASIVE CV LAB;  Service: Cardiovascular;  Laterality: N/A;   SHOULDER SURGERY     TEE WITHOUT CARDIOVERSION N/A 05/22/2020   Procedure: TRANSESOPHAGEAL ECHOCARDIOGRAM (TEE);  Surgeon: Jodelle Red, MD;  Location: [facility] ENDOSCOPY;  Service: Cardiovascular;  Laterality: N/A;   TEE WITHOUT CARDIOVERSION N/A 06/18/2020   Procedure: TRANSESOPHAGEAL ECHOCARDIOGRAM (TEE);  Surgeon: Tonny Bollman, MD;  Location: [facility] INVASIVE CV LAB;  Service: Cardiovascular;  Laterality: N/A;   TRANSCATHETER MITRAL EDGE TO EDGE REPAIR N/A 06/18/2020   Procedure: MITRAL VALVE REPAIR;  Surgeon: Tonny Bollman, MD;  Location: [facility] INVASIVE CV LAB;  Service: Cardiovascular;  Laterality: N/A;     A IV Location/Drains/Wounds Patient Lines/Drains/Airways Status     Active Line/Drains/Airways     Name Placement date Placement time Site Days   Peripheral IV 01/07/23 20 G Right Antecubital 01/07/23  1128  Antecubital  less than 1            Intake/Output Last 24 hours No intake or output data in the 24 hours ending 01/07/23 1318  Labs/Imaging Results for orders placed or performed during the hospital encounter of 01/07/23 (from the past 48 hour(s))  CBC     Status: Abnormal   Collection Time: 01/07/23 10:20 AM  Result Value Ref Range   WBC 7.2 4.0 - 10.5 K/uL   RBC 2.53 (L) 4.22 - 5.81  MIL/uL   Hemoglobin 6.9 (LL) 13.0 - 17.0 g/dL    Comment: REPEATED TO VERIFY THIS CRITICAL RESULT HAS VERIFIED AND BEEN CALLED TO LATOYA Melida Northington RN BY SHANNON FLEMING ON 10 26 2024 AT 1123, AND HAS BEEN READ BACK.     HCT 23.7 (L) 39.0 - 52.0 %   MCV 93.7 80.0 - 100.0 fL   MCH 27.3 26.0 - 34.0 pg   MCHC 29.1 (L) 30.0 - 36.0 g/dL   RDW 95.2 84.1 - 32.4 %   Platelets 267 150 - 400 K/uL   nRBC 0.0 0.0 - 0.2 %    Comment: Performed at  [facility], [address], [place], [place] [address]  Basic metabolic panel     Status: Abnormal   Collection Time: 01/07/23 10:20 AM  Result Value Ref Range   Sodium 139 135 - 145 mmol/L   Potassium 4.0 3.5 - 5.1 mmol/L   Chloride 114 (H) 98 - 111 mmol/L   CO2 15 (L) 22 - 32 mmol/L   Glucose, Bld 109 (H) 70 - 99 mg/dL    Comment: Glucose reference range applies only to samples taken after fasting for at least 8 hours.   BUN 64 (H) 8 - 23 mg/dL   Creatinine, Ser 7.25 (H) 0.61 - 1.24 mg/dL   Calcium 8.7 (L) 8.9 - 10.3 mg/dL   GFR, Estimated 31 (L) >60 mL/min    Comment: (NOTE) Calculated using the CKD-EPI Creatinine Equation (2021)    Anion gap 10 5 - 15    Comment: Performed at [facility], [address] [place], [place] [address]  Type and screen [facility]     Status: None (Preliminary result)   Collection Time: 01/07/23 11:55 AM  Result Value Ref Range   ABO/RH(D) A POS    Antibody Screen NEG    Sample Expiration 01/10/2023,2359    Unit Number [id number]    Blood Component Type RED CELLS,LR    Unit division 00    Status of Unit ISSUED    Transfusion Status OK TO TRANSFUSE    Crossmatch Result      Compatible Performed at [facility], [address], [place], [place] [address]   Prepare RBC (crossmatch)     Status: None   Collection Time: 01/07/23 12:20 PM  Result Value Ref Range   Order Confirmation      ORDER PROCESSED BY BLOOD BANK Performed at [facility], [address] [place], [place] [address]   POC occult blood, ED     Status: None   Collection Time: 01/07/23 12:55 PM  Result Value Ref Range   Fecal Occult Bld NEGATIVE NEGATIVE   CT Cervical Spine Wo Contrast  Result Date: 01/07/2023 CLINICAL DATA:  89 year old male status post fall from wheelchair. EXAM: CT CERVICAL SPINE WITHOUT CONTRAST TECHNIQUE: Multidetector CT imaging of the cervical spine was performed without intravenous contrast. Multiplanar  CT image reconstructions were also generated. RADIATION DOSE REDUCTION: This exam was performed according to the departmental dose-optimization program which includes automated exposure control, adjustment of the mA and/or kV according to patient size and/or use of iterative reconstruction technique. COMPARISON:  CT head today.  Previous CT cervical spine 02/06/2022. FINDINGS: Alignment: Reversal of the normal cervical lordosis not significantly changed from last year. Chronic anterolisthesis of C3 on C4 and to a lesser extent C4 on C5, C7 on T1. Stable bilateral posterior element alignment. Skull base and vertebrae: Stable craniocervical  junction alignment. Mild basilar invagination in the setting of un healed chronic C1 ring fracture with lateral displacement on the C2 articular pillars. C2 appears stable and intact. Chronic degenerative appearing C2-C3 facet ankylosis. Chronic degenerative appearing C4-C5 ankylosis. No acute osseous abnormality identified. Soft tissues and spinal canal: No prevertebral fluid or swelling. No visible canal hematoma. Stable visible noncontrast neck soft tissues, bulky chronic left cervical carotid calcified atherosclerosis. Disc levels: Chronic cervicomedullary junction stenosis suspected in part due to bulky ligamentous hypertrophy about the odontoid. Probably no other significant cervical spinal stenosis despite degenerative changes. Upper chest: Visible upper thoracic levels appear intact. Partially calcified apical lung scarring. Calcified great vessel atherosclerosis. IMPRESSION: 1. No acute traumatic injury identified in the cervical spine. 2. 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 3. Other advanced cervical spine degeneration superimposed on chronic C2-C3 and C4-C5 ankylosis, but probably no other significant cervical spinal stenosis. 4. Calcified atherosclerosis. Electronically Signed   By: Odessa Fleming  M.D.   On: 01/07/2023 12:17   CT Head Wo Contrast  Result Date: 01/07/2023 CLINICAL DATA:  89 year old male status post fall from wheelchair. EXAM: CT HEAD WITHOUT CONTRAST TECHNIQUE: Contiguous axial images were obtained from the base of the skull through the vertex without intravenous contrast. RADIATION DOSE REDUCTION: This exam was performed according to the departmental dose-optimization program which includes automated exposure control, adjustment of the mA and/or kV according to patient size and/or use of iterative reconstruction technique. COMPARISON:  Head CT 02/08/2022 and earlier. FINDINGS: Brain: Stable cerebral volume. No midline shift, mass effect, or evidence of intracranial mass lesion. No ventriculomegaly. No acute intracranial hemorrhage identified. Stable gray-white matter differentiation throughout the brain. No cortically based acute infarct identified. Vascular: Calcified atherosclerosis at the skull base. No suspicious intracranial vascular hyperdensity. Skull: Multiple chronic bilateral burr holes are stable. Un healed chronic C1 ring fracture. No acute osseous abnormality identified. Sinuses/Orbits: Visualized paranasal sinuses and mastoids are stable and well aerated. Other: No acute orbit or scalp soft tissue finding. IMPRESSION: 1. No acute intracranial abnormality or acute traumatic injury identified. 2. Un-healed chronic C1 ring fracture. CT cervical spine reported separately. Electronically Signed   By: Odessa Fleming M.D.   On: 01/07/2023 12:12    Pending Labs Unresulted Labs (From admission, onward)     Start     Ordered   01/08/23 0500  CBC  Tomorrow morning,   R        01/07/23 1304   01/08/23 0500  Basic metabolic panel  Tomorrow morning,   R        01/07/23 1304            Vitals/Pain Today's Vitals   01/07/23 1003 01/07/23 1045 01/07/23 1215 01/07/23 1256  BP:  112/63 109/64 123/75  Pulse:  82 82 83  Resp:  (!) 21 (!) 30 18  Temp:    97.8 F (36.6 C)   TempSrc:    Oral  SpO2: 100% 100% 100% 96%  PainSc:        Isolation Precautions No active isolations  Medications Medications  sodium chloride 0.9 % bolus 500 mL (has no administration in time range)  0.9 %  sodium chloride infusion (Manually program via Guardrails IV Fluids) ( Intravenous New Bag/Given 01/07/23 1314)    Mobility manual wheelchair     Focused Assessments Cardiac Assessment Handoff:  Cardiac Rhythm: Atrial fibrillation No results found for: "CKTOTAL", "CKMB", "CKMBINDEX", "TROPONINI" No results found for: "DDIMER" Does  the Patient currently have chest pain? No    R Recommendations: See Admitting Provider Note  Report given to:   Additional Notes:

## 2023-01-07 NOTE — H&P (Addendum)
Hospital Admission History and Physical Service Pager: [phone]  Patient name: Adrian [name] Medical record number: [id number] Date of Birth: 1927-08-13 Age: 89 y.o. Gender: male  Primary Care Provider: [facility] Consultants: None Code Status: DNR/DNI, confirmed with family and patient Preferred Emergency Contact:  Contact Information     Name Relation Home Work [place] Son   [phone]   Katharine Look Daughter   [phone]      Other Contacts   None on File      Chief Complaint: Symptomatic anemia  Assessment and Plan: Adrian [name] is a 89 y.o. male presenting with fall at assisted living facility found to have profound normocytic anemia.  Patient is in relatively stable condition and receiving fluids and PRBCs.  Patient and family were part of the discussion for course of treatment here in the hospital and it is uncertain whether they would like to do any invasive testing such as EGD or colonoscopy but they would like [place] to treat his anemia and AKI.    Differential for presentation of this includes: Upper GI bleed Very common in elderly patients Would account for the sharp decline in a short period of time Patient denies any epigastric pain, coffee-ground emesis, dark stools Lower GI bleed Also very common in elderly patients Also account for sharp decline Patient does not report any dark or bloody stools FOBT in ED was negative Colon cancer Common in elderly patients To follow more of an indolent course of anemia Patient reports no changes in his stool habits Hematologic cancer Common in elderly patients Would account for the anemia Other blood counts are within normal limits Patient is otherwise fairly healthy Aplastic anemia  A.  See above reasoning for hematologic cancer Assessment & Plan Acute anemia GI bleed higher on the differential given age.  No prior colonoscopy records that I can see.  Patient is receiving 1  unit of PRBCs in the emergency department.  Family is going to discuss what procedures they would like done as the patient is 86 and they do not want to put him through more than he needs to be put through to feel well. -Admit to FM TS, Dr. Miquel Dunn attending -Posttransfusion H&H, AM CBC -Iron, TIBC, ferritin, vitamin B12, folate to further assess anemia -Transfuse if <8.0 given CAD history -Discussed with family, will hold off on GI consult for now until they can chat more AKI (acute kidney injury) (HCC) Most likely in the setting of acute anemia/dehydration.  Dehydration likely due to decreased intake per report.  Patient received 500 mL bolus in the emergency department along with blood transfusion. -Monitor I's and O's -Encourage p.o. fluid intake given CHF status and fluid shortage -AM BMP -Hold Lasix until AKI resolves Fall, initial encounter Likely due to deconditioning and weakness from acute anemia while trying to transfer between chairs.  CT head without acute abnormality.  No concern for extremity fracture at this time. -PT OT eval  Chronic compression fracture of C1 As seen on CT cervical spine.  In the context of longstanding neck pain.  Exam reassuring with grossly normal range of motion. NSGY consulted with recommendations to continue soft C collar without need for surgical intervention given his age and chronicity, as expected. 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 male presenting with anemia found  after evaluation for a fall.  Patient was trying to transfer from wheelchair to bed. Happened all of a sudden. Unclear if he passed out.  States the only thing he had on the way down was the floor does not recall hitting his head.  Denies any weakness, dizziness, chest pain, shortness of breath, blood in the stool. Has been eating and drinking normally patient, but son says no fluids. Last urinated yesterday at lunch. Unsure of last bowel movement. No abdominal pain.  In the ED, he had basic lab work done as well as FOBT, CT head and neck, and EKG. labs were normal with the exception of AKI and hemoglobin of 6.9.  Received 500 mL bolus of fluid and 1 unit of PRBCs.  ET head and neck were negative for any acute intracranial abnormalities but did note an unhealed C1 fracture that the family was previously unaware of.  Review Of Systems: Per HPI.  Pertinent Past Medical History: Chronic diastolic heart failure Persistent Afib Non rheumatic mitral regurgitation Intracranial Hemorrhage Sleep apnea, central - uses cpap GERD Bilateral osteoarthritis of the knee Remainder reviewed in history tab.   Pertinent Past Surgical History: Mitral valve clip implantation Heart catheterization Remainder reviewed in history tab.   Pertinent Social History: Tobacco use: No Alcohol use: No Other Substance use: No Lives at Alf  Pertinent Family History: None Remainder reviewed in history tab.   Important Outpatient Medications: Flonase 50 mcg/ACT daily Furosemide 20 mg QOD Gabapentin 300 mg in AM and 600 mg pm Montelukast 10 mg daily Nitrostat 0.4 mg tablet prn Protonix 40 mg Exelon 3 mg BID Remainder reviewed in medication history.   Objective: BP (!) 124/93   Pulse 72   Temp (!) 97.5 F (36.4 C) (Oral)   Resp (!) 26   SpO2 99%  Exam: General: Alert, oriented.  No obvious distress Cardiovascular: A-fib, no M/R/G.  Delayed capillary refill Respiratory: CTAB, no increased work of  breathing Gastrointestinal: Flat, soft.  Mild tenderness to palpation in the left upper quadrant MSK: Appropriate bulk and tone.  Bilateral digits of the hands with osteo arthritic changes.  Trace bilateral pitting edema Derm: No apparent bruising or hematomas Neuro: Cranial nerves II through XII intact bilaterally.  Strength 4/5 bilaterally in the upper and lower extremities.  Intact sensation.  Appropriately responsive to verbal prompts and stimuli.   Labs:  CBC BMET  Recent Labs  Lab 01/07/23 1020  WBC 7.2  HGB 6.9*  HCT 23.7*  PLT 267   Recent Labs  Lab 01/07/23 1020  NA 139  K 4.0  CL 114*  CO2 15*  BUN 64*  CREATININE 2.00*  GLUCOSE 109*  CALCIUM 8.7*    FOBT: Negative  EKG: Atrial fibrillation, positive axis, no evidence of ST elevations, LBBB  Imaging Studies Performed: CT head without contrast IMPRESSION: 1. No acute intracranial abnormality or acute traumatic injury identified. 2. Un-healed chronic C1 ring fracture. CT cervical spine reported separately.  CT cervical spine without contrast IMPRESSION: 1. No acute traumatic injury identified in the cervical spine. 2. 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 3. 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 4. Calcified atherosclerosis.  Gerrit Heck, DO 01/07/2023, 2:36 PM PGY-1, [facility]  FPTS Intern pager: [phone], text pages welcome Secure chat group [facility] Teaching Service   I agree with the assessment and plan as documented in the note above. Janeal Holmes, MD PGY-2, [facility]

## 2023-01-08 ENCOUNTER — Observation Stay (HOSPITAL_COMMUNITY): Payer: Medicare HMO

## 2023-01-08 ENCOUNTER — Encounter: Payer: Self-pay | Admitting: Cardiovascular Disease

## 2023-01-08 DIAGNOSIS — N179 Acute kidney failure, unspecified: Secondary | ICD-10-CM | POA: Insufficient documentation

## 2023-01-08 DIAGNOSIS — D509 Iron deficiency anemia, unspecified: Secondary | ICD-10-CM

## 2023-01-08 DIAGNOSIS — S12090A Other displaced fracture of first cervical vertebra, initial encounter for closed fracture: Secondary | ICD-10-CM | POA: Insufficient documentation

## 2023-01-08 DIAGNOSIS — D649 Anemia, unspecified: Secondary | ICD-10-CM | POA: Diagnosis not present

## 2023-01-08 DIAGNOSIS — W19XXXA Unspecified fall, initial encounter: Secondary | ICD-10-CM | POA: Insufficient documentation

## 2023-01-08 LAB — TYPE AND SCREEN
ABO/RH(D): A POS
Antibody Screen: NEGATIVE
Unit division: 0

## 2023-01-08 LAB — BASIC METABOLIC PANEL
Anion gap: 12 (ref 5–15)
BUN: 47 mg/dL — ABNORMAL HIGH (ref 8–23)
CO2: 13 mmol/L — ABNORMAL LOW (ref 22–32)
Calcium: 8.7 mg/dL — ABNORMAL LOW (ref 8.9–10.3)
Chloride: 115 mmol/L — ABNORMAL HIGH (ref 98–111)
Creatinine, Ser: 1.5 mg/dL — ABNORMAL HIGH (ref 0.61–1.24)
GFR, Estimated: 43 mL/min — ABNORMAL LOW (ref >60)
Glucose, Bld: 89 mg/dL (ref 70–99)
Potassium: 4.3 mmol/L (ref 3.5–5.1)
Sodium: 140 mmol/L (ref 135–145)

## 2023-01-08 LAB — BPAM RBC
Blood Product Expiration Date: 202411192359
ISSUE DATE / TIME: 202410261258
Unit Type and Rh: 6200

## 2023-01-08 LAB — CBC
HCT: 27.2 % — ABNORMAL LOW (ref 39.0–52.0)
HCT: 29.3 % — ABNORMAL LOW (ref 39.0–52.0)
Hemoglobin: 8.6 g/dL — ABNORMAL LOW (ref 13.0–17.0)
Hemoglobin: 9.2 g/dL — ABNORMAL LOW (ref 13.0–17.0)
MCH: 27.6 pg (ref 26.0–34.0)
MCH: 27.1 pg (ref 26.0–34.0)
MCHC: 31.6 g/dL (ref 30.0–36.0)
MCHC: 31.4 g/dL (ref 30.0–36.0)
MCV: 87.2 fL (ref 80.0–100.0)
MCV: 86.2 fL (ref 80.0–100.0)
Platelets: 279 10*3/uL (ref 150–400)
Platelets: 300 10*3/uL (ref 150–400)
RBC: 3.12 MIL/uL — ABNORMAL LOW (ref 4.22–5.81)
RBC: 3.4 MIL/uL — ABNORMAL LOW (ref 4.22–5.81)
RDW: 15.4 % (ref 11.5–15.5)
RDW: 15.3 % (ref 11.5–15.5)
WBC: 6.9 10*3/uL (ref 4.0–10.5)
WBC: 9.4 10*3/uL (ref 4.0–10.5)
nRBC: 0 % (ref 0.0–0.2)
nRBC: 0 % (ref 0.0–0.2)

## 2023-01-08 MED ORDER — DICLOFENAC SODIUM 1 % EX GEL
2.0000 g | Freq: Four times a day (QID) | CUTANEOUS | Status: DC
  Filled 2023-01-08: qty 100

## 2023-01-08 MED ORDER — ENSURE ENLIVE PO LIQD
237.0000 mL | Freq: Two times a day (BID) | ORAL | Status: DC
  Administered 2023-01-08 – 2023-01-10 (×5): 237 mL via ORAL

## 2023-01-08 MED ORDER — IOHEXOL 350 MG/ML SOLN
75.0000 mL | Freq: Once | INTRAVENOUS | Status: AC | PRN
  Administered 2023-01-08: 75 mL via INTRAVENOUS

## 2023-01-08 NOTE — Care Management Obs Status (Signed)
MEDICARE OBSERVATION STATUS NOTIFICATION   Patient Details  Name: Adrian [name] MRN: [id number] Date of Birth: 05-25-1927   Medicare Observation Status Notification Given:  Yes    Ronny Bacon, RN 01/08/2023, 9:38 AM

## 2023-01-08 NOTE — Evaluation (Signed)
Physical Therapy Evaluation Patient Details Name: Adrian [name] MRN: [id number] DOB: Sep 23, 1927 Today's Date: 01/08/2023  History of Present Illness  Pt is a 89 y.o. male presenting 10/26 from [facility] with mechanical fall from wheelchair to recliner. Found to have severe iron deficiency anemia, AKI, chronic C1 fx. PMH significant for afib, mitral regurgitation s/p mitral valve clip, OSA, intracranial hemorrhage, GERD.    Clinical Impression  Pt in bed upon arrival of PT, agreeable to evaluation at this time. Prior to admission the pt was able to transfer from his bed-WC independently, reports he was completing stand-pivot without use of RW. The pt needed modA for safety and balance when attempting to complete pivot without DME this morning. He demos improved stability, posture, and clearance of steps with use of RW for small lateral steps. Discussed with pt's family who report staff at [facility] can provide increased assist with transfers and mobility in his room, and that he can receive PT at that facility. Will continue to benefit from skilled PT acutely to progress mobility and independence with transfers, anticipate he will be able to return to facility when medically cleared for d/c.       If plan is discharge home, recommend the following: A lot of help with walking and/or transfers;A lot of help with bathing/dressing/bathroom;Assistance with cooking/housework;Direct supervision/assist for medications management;Direct supervision/assist for financial management;Assist for transportation;Help with stairs or ramp for entrance;Supervision due to cognitive status   Can travel by private vehicle        Equipment Recommendations None recommended by PT (pt has needed DME)  Recommendations for Other Services       Functional Status Assessment Patient has had a recent decline in their functional status and demonstrates the ability to make significant improvements in function in a  reasonable and predictable amount of time.     Precautions / Restrictions Precautions Precautions: Fall Precaution Comments: admitted after a fall Required Braces or Orthoses: Cervical Brace Cervical Brace: Soft collar;For comfort ("as tolerated") Restrictions Weight Bearing Restrictions: No      Mobility  Bed Mobility Overal bed mobility: Needs Assistance Bed Mobility: Rolling, Sidelying to Sit Rolling: Min assist Sidelying to sit: Min assist, +2 for physical assistance, HOB elevated, Used rails       General bed mobility comments: minA to roll and minA to manage LE and trunk to achieve sitting. pt does assist but using bed elevated and rails. fearful of falling out of bed    Transfers Overall transfer level: Needs assistance Equipment used: Rolling walker (2 wheels), None Transfers: Sit to/from Stand, Bed to chair/wheelchair/BSC Sit to Stand: Mod assist, +2 physical assistance   Step pivot transfers: Mod assist, +2 physical assistance       General transfer comment: pt initially showing transfer he normally does, no DME pivot from bed-recliner. modA of 2 with cues for safety and hand placement. pt then cued to attempt sit-stand with RW, improved safety, balance, and ability to complete small pivotal steps with minA and improve upright posture    Ambulation/Gait Ambulation/Gait assistance: Mod assist, +2 physical assistance     Gait Pattern/deviations: Step-to pattern       General Gait Details: small pivotal steps with BUE support on armrest of chair, then small lateral steps along edge of chair with BUE support on RW     Balance Overall balance assessment: Needs assistance Sitting-balance support: Bilateral upper extremity supported, Feet supported Sitting balance-Leahy Scale: Poor Sitting balance - Comments: fearful of falling, increased time  and cues to get feet on floor Postural control: Posterior lean Standing balance support: Bilateral upper extremity  supported, During functional activity Standing balance-Leahy Scale: Poor Standing balance comment: trunk flexed, modA to steady                             Pertinent Vitals/Pain Pain Assessment Pain Assessment: Faces Faces Pain Scale: Hurts even more Pain Location: R side of neck (posterior/lateral) Pain Descriptors / Indicators: Discomfort, Grimacing, Guarding Pain Intervention(s): Limited activity within patient's tolerance, Monitored during session, Repositioned    Home Living Family/patient expects to be discharged to:: Assisted living                 Home Equipment: Agricultural consultant (2 wheels);Rollator (4 wheels);Wheelchair - manual;Wheelchair - power Additional Comments: pt typically transfers to and from [facility] independently without use of RW, does walk with staff at times with rollator    Prior Function Prior Level of Function : Needs assist       Physical Assist : Mobility (physical);ADLs (physical) Mobility (physical): Gait ADLs (physical): Bathing;Dressing;Toileting;IADLs Mobility Comments: pt walking with staff using rollator and chair follow. does go to exercise classes as able ADLs Comments: assist from staff for compression socks     Extremity/Trunk Assessment   Upper Extremity Assessment Upper Extremity Assessment: Defer to OT evaluation    Lower Extremity Assessment Lower Extremity Assessment: Generalized weakness (limited power and endurance, no overt buckling)    Cervical / Trunk Assessment Cervical / Trunk Assessment: Kyphotic  Communication   Communication Communication: Hearing impairment Cueing Techniques: Verbal cues;Gestural cues;Tactile cues;Visual cues  Cognition Arousal: Alert Behavior During Therapy: Flat affect, WFL for tasks assessed/performed Overall Cognitive Status: Within Functional Limits for tasks assessed                                 General Comments: not formally assessed, at times limited by [facility],  per family, pt likely close to baseline        General Comments General comments (skin integrity, edema, etc.): VSS on RA, family present and supportive. pt left compression socks on all night, skin checked, is red but no sores    Exercises     Assessment/Plan    PT Assessment Patient needs continued PT services  PT Problem List Decreased strength;Decreased activity tolerance;Decreased balance;Decreased mobility;Decreased safety awareness       PT Treatment Interventions DME instruction;Gait training;Functional mobility training;Therapeutic activities;Therapeutic exercise;Balance training    PT Goals (Current goals can be found in the Care Plan section)  Acute Rehab PT Goals Patient Stated Goal: return to [facility] [facility] PT Goal Formulation: With patient/family Time For Goal Achievement: 01/22/23 Potential to Achieve Goals: Good    Frequency Min 1X/week     Co-evaluation PT/OT/SLP Co-Evaluation/Treatment: Yes Reason for Co-Treatment: Complexity of the patient's impairments (multi-system involvement);Necessary to address cognition/behavior during functional activity;For patient/therapist safety;To address functional/ADL transfers PT goals addressed during session: Mobility/safety with mobility;Balance;Proper use of DME;Strengthening/ROM         AM-PAC PT "6 Clicks" Mobility  Outcome Measure Help needed turning from your back to your side while in a flat bed without using bedrails?: A Lot Help needed moving from lying on your back to sitting on the side of a flat bed without using bedrails?: A Lot Help needed moving to and from a bed to a chair (including a wheelchair)?: A Lot Help  needed standing up from a chair using your arms (e.g., wheelchair or bedside chair)?: A Lot Help needed to walk in hospital room?: Total Help needed climbing 3-5 steps with a railing? : Total 6 Click Score: 10    End of Session Equipment Utilized During Treatment: Gait belt;Cervical  collar Activity Tolerance: Patient limited by pain Patient left: in chair;with call bell/phone within reach;with chair alarm set;with family/visitor present Nurse Communication: Mobility status PT Visit Diagnosis: Other abnormalities of gait and mobility (R26.89);Muscle weakness (generalized) (M62.81);Pain Pain - part of body:  (neck)    Time: 1914-7829 PT Time Calculation (min) (ACUTE ONLY): 41 min   Charges:   PT Evaluation $PT Eval Low Complexity: 1 Low PT Treatments $Therapeutic Activity: 8-22 mins PT General Charges $$ ACUTE PT VISIT: 1 Visit         Vickki Muff, PT, DPT   Acute Rehabilitation Department Office [phone] Secure Chat Communication Preferred  Ronnie Derby 01/08/2023, 2:34 PM

## 2023-01-08 NOTE — Progress Notes (Signed)
Patient took off gown and CPAP multiple times. He also attempted to get out of bed stating "he has to to go." I laid him back in bed, Changed his gown, re-applied his CPAP, and placed his call light within reach.

## 2023-01-08 NOTE — Assessment & Plan Note (Addendum)
Neurologically intact today.  Awake and alert.  Family notes improvement in his mentation today. -Fall precautions ordered today -Plan to work with physical therapy today-with the knowledge that his baseline require some assistance and will return to ALF

## 2023-01-08 NOTE — Evaluation (Signed)
Occupational Therapy Evaluation Patient Details Name: Adrian [name] MRN: [id number] DOB: Jun 15, 1927 Today's Date: 01/08/2023   History of Present Illness Pt is a 89 y.o. male presenting 10/26 from [facility] with mechanical fall from wheelchair to recliner. Found to have severe iron deficiency anemia, AKI, chronic C1 fx. PMH significant for afib, mitral regurgitation s/p mitral valve clip, OSA, intracranial hemorrhage, GERD   Clinical Impression   PTA, pt lived at ALF and received assist with LB ADL. Upon eval, pt requires +2 mod A for transfers, total A for LB ADL, and up to min A for Ub ADL secondary to neck pain. Cervical collar encouraged and pt agreeable. Daughter reports staff can assist at home, recommending [facility].       If plan is discharge home, recommend the following: A lot of help with bathing/dressing/bathroom;A lot of help with walking and/or transfers;Two people to help with walking and/or transfers;Two people to help with bathing/dressing/bathroom;Assistance with cooking/housework;Assist for transportation;Help with stairs or ramp for entrance    Functional Status Assessment  Patient has had a recent decline in their functional status and demonstrates the ability to make significant improvements in function in a reasonable and predictable amount of time.  Equipment Recommendations  Other (comment) (RW)    Recommendations for Other Services       Precautions / Restrictions Precautions Precautions: Fall Precaution Comments: admitted after a fall Required Braces or Orthoses: Cervical Brace Cervical Brace: Soft collar;For comfort ("as tolerated") Restrictions Weight Bearing Restrictions: No      Mobility Bed Mobility Overal bed mobility: Needs Assistance Bed Mobility: Rolling, Sidelying to Sit Rolling: Min assist Sidelying to sit: Min assist, +2 for physical assistance, HOB elevated, Used rails       General bed  mobility comments: minA to roll and minA to manage LE and trunk to achieve sitting. pt does assist but using bed elevated and rails. fearful of falling out of bed    Transfers Overall transfer level: Needs assistance Equipment used: Rolling walker (2 wheels), None Transfers: Sit to/from Stand, Bed to chair/wheelchair/BSC Sit to Stand: Mod assist, +2 physical assistance     Step pivot transfers: Mod assist, +2 physical assistance     General transfer comment: pt initially showing transfer he normally does, no DME pivot from bed-recliner. modA of 2 with cues for safety and hand placement. pt then cued to attempt sit-stand with RW, improved safety, balance, and ability to complete small pivotal steps with minA and improve upright posture      Balance Overall balance assessment: Needs assistance Sitting-balance support: Bilateral upper extremity supported, Feet supported Sitting balance-Leahy Scale: Poor Sitting balance - Comments: fearful of falling, increased time and cues to get feet on floor Postural control: Posterior lean Standing balance support: Bilateral upper extremity supported, During functional activity Standing balance-Leahy Scale: Poor Standing balance comment: trunk flexed, modA to steady                           ADL either performed or assessed with clinical judgement   ADL Overall ADL's : Needs assistance/impaired Eating/Feeding: Set up;Sitting   Grooming: Set up;Sitting Grooming Details (indicate cue type and reason): with back support Upper Body Bathing: Set up;Sitting   Lower Body Bathing: Maximal assistance   Upper Body Dressing : Minimal assistance;Sitting   Lower Body Dressing: Total assistance   Toilet Transfer: Moderate assistance;+2 for physical assistance;+2 for safety/equipment  Functional mobility during ADLs: Moderate assistance;+2 for physical assistance;+2 for safety/equipment       Vision Baseline Vision/History:  1 Wears glasses Ability to See in Adequate Light: 0 Adequate Patient Visual Report: No change from baseline Additional Comments: not formally assessed     Perception         Praxis         Pertinent Vitals/Pain Pain Assessment Faces Pain Scale: Hurts even more Pain Location: R side of neck (posterior/lateral) Pain Descriptors / Indicators: Discomfort, Grimacing, Guarding Pain Intervention(s): Limited activity within patient's tolerance, Monitored during session     Extremity/Trunk Assessment Upper Extremity Assessment Upper Extremity Assessment: Generalized weakness   Lower Extremity Assessment Lower Extremity Assessment: Defer to PT evaluation   Cervical / Trunk Assessment Cervical / Trunk Assessment: Kyphotic   Communication Communication Communication: Hearing impairment Cueing Techniques: Verbal cues;Gestural cues;Visual cues;Tactile cues   Cognition Arousal: Alert Behavior During Therapy: Flat affect, WFL for tasks assessed/performed Overall Cognitive Status: Within Functional Limits for tasks assessed                                 General Comments: not formally assessed, at times limited by [facility], per family, pt likely close to baseline. "forgetful at baseline"     General Comments  VSS on RA; family present,  pleasant and supportive    Exercises     Shoulder Instructions      Home Living Family/patient expects to be discharged to:: Assisted living                             Home Equipment: Rolling Walker (2 wheels);Rollator (4 wheels);Wheelchair - manual;Wheelchair - power   Additional Comments: pt typically transfers to and from [facility] independently without use of RW, does walk with staff at times with rollator      Prior Functioning/Environment Prior Level of Function : Needs assist       Physical Assist : Mobility (physical);ADLs (physical) Mobility (physical): Gait ADLs (physical):  Bathing;Dressing;Toileting;IADLs Mobility Comments: pt walking with staff using rollator and chair follow. does go to exercise classes as able ADLs Comments: assist from staff for compression socks        OT Problem List: Decreased strength;Decreased activity tolerance;Impaired balance (sitting and/or standing);Decreased cognition;Decreased safety awareness;Decreased knowledge of use of DME or AE      OT Treatment/Interventions: Self-care/ADL training;Therapeutic exercise;DME and/or AE instruction;Balance training;Patient/family education;Therapeutic activities    OT Goals(Current goals can be found in the care plan section) Acute Rehab OT Goals Patient Stated Goal: get home OT Goal Formulation: With patient Time For Goal Achievement: 01/22/23 Potential to Achieve Goals: Good  OT Frequency: Min 1X/week    Co-evaluation   Reason for Co-Treatment: Complexity of the patient's impairments (multi-system involvement);Necessary to address cognition/behavior during functional activity;For patient/therapist safety;To address functional/ADL transfers PT goals addressed during session: Mobility/safety with mobility;Balance;Proper use of DME;Strengthening/ROM        AM-PAC OT "6 Clicks" Daily Activity     Outcome Measure Help from another person eating meals?: A Little Help from another person taking care of personal grooming?: A Little Help from another person toileting, which includes using toliet, bedpan, or urinal?: A Lot Help from another person bathing (including washing, rinsing, drying)?: A Lot Help from another person to put on and taking off regular upper body clothing?: A Little Help from another person to put  on and taking off regular lower body clothing?: Total 6 Click Score: 14   End of Session Equipment Utilized During Treatment: Gait belt;Rolling walker (2 wheels);Cervical collar Nurse Communication: Mobility status  Activity Tolerance: Patient tolerated treatment  well Patient left: in chair;with call bell/phone within reach;with chair alarm set;with family/visitor present  OT Visit Diagnosis: Unsteadiness on feet (R26.81);Muscle weakness (generalized) (M62.81);Other symptoms and signs involving cognitive function;History of falling (Z91.81)                Time: 3710-6269 OT Time Calculation (min): 40 min Charges:  OT General Charges $OT Visit: 1 Visit OT Evaluation $OT Eval Moderate Complexity: 1 Mod  Adrian [name], OTR/L [facility] Acute Rehabilitation Office: [phone]   Adrian [name] 01/08/2023, 5:39 PM

## 2023-01-08 NOTE — Plan of Care (Signed)
  Problem: Clinical Measurements: Goal: Ability to maintain clinical measurements within normal limits will improve Outcome: Progressing Goal: Diagnostic test results will improve Outcome: Progressing Goal: Cardiovascular complication will be avoided Outcome: Progressing   Problem: Clinical Measurements: Goal: Diagnostic test results will improve Outcome: Progressing   Problem: Clinical Measurements: Goal: Cardiovascular complication will be avoided Outcome: Progressing

## 2023-01-08 NOTE — Assessment & Plan Note (Addendum)
Not wearing soft collar, does not like it.

## 2023-01-08 NOTE — Assessment & Plan Note (Addendum)
Creatinine improving, 1.5 this morning. Still with low p.o. intake, but is hungry and requesting breakfast this morning. Likely prerenal with possible postrenal (obstructive) given bladder scan overnight with 456, and 700 mL with In-N-Out cath -Continue to collect bladder scans, if tomorrow bladder scans show greater than 300 mL, may need to consider Foley placement -Encourage p.o. fluid intake given CHF status and fluid shortage -AM BMP -Hold Lasix until AKI resolves

## 2023-01-08 NOTE — Progress Notes (Signed)
Daily Progress Note Intern Pager: [phone]  Patient name: Adrian [name] Medical record number: [id number] Date of birth: Jan 14, 1928 Age: 89 y.o. Gender: male  Primary Care Provider: [facility] Consultants: Neurosurgery Code Status: DNR/DNI  Pt Overview and Major Events to Date:  10/26: Admitted from ALF with fall from wheelchair to chair transfer  Assessment and Plan:  Adrian [name] is a pleasant 89 year old male here with fall, and severe iron deficiency anemia, improving s/p 1U PRBC.  Assessment & Plan Iron deficiency anemia Fairly severe.  Hemoglobin 8.8 this morning s/p 1U PRBC yesterday for hemoglobin 6.9. Concern remains for possible GI losses/possible malignancy. Long discussion with family (daughter and son at bedside) this morning, and they do not wish to pursue aggressive workup with colonoscopy, as he would not tolerate the prep well. Question if he could have a less invasive workup such as capsule endoscopy. -CBC this evening at 1800, and in the morning -Will receive outpatient IV iron transfusions -Continue ferrous sulfate 325 mg daily Fall Neurologically intact today.  Awake and alert.  Family notes improvement in his mentation today. -Fall precautions ordered today -Plan to work with physical therapy today-with the knowledge that his baseline require some assistance and will return to ALF AKI (acute kidney injury) (HCC) Creatinine improving, 1.5 this morning. Still with low p.o. intake, but is hungry and requesting breakfast this morning. Likely prerenal with possible postrenal (obstructive) given bladder scan overnight with 456, and 700 mL with In-N-Out cath -Continue to collect bladder scans, if tomorrow bladder scans show greater than 300 mL, may need to consider Foley placement -Encourage p.o. fluid intake given CHF status and fluid shortage -AM BMP -Hold Lasix until AKI resolves Compression fracture of C1 vertebra (HCC) Not wearing  soft collar, does not like it.    FEN/GI: Regular PPx: SCDs, he is a high bleeding risk so will not order chemical VTE prophylaxis Dispo: Return to ALF when clinically stable and improved    Subjective:  Patient is awake and alert this morning on my exam.  He is not wearing his hearing aids, so has a hard time hearing but says that he is hungry and wants grits and sausage.  He denies pain anywhere. 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  They say that at his ALF, he does well working with physical therapy and are looking forward to him working with him on the hospital.  Family offered him water this morning, which he readily excepted. Daughter thinks he looks a lot better this morning after receiving a blood transfusion, and she is pleased with this.  Objective: Temp:  [97.4 F (36.3 C)-98.2 F (36.8 C)] 98.2 F (36.8 C) (10/27 0833) Pulse Rate:  [56-89] 56 (10/27 0415) Resp:  [16-30] 19 (10/27 0833) BP: (109-142)/(63-116) 131/83 (10/27 0833) SpO2:  [88 %-100 %] 88 % (10/27 1308) Physical Exam: General: Comfortable resting in bed, awake and alert, elderly, frail HEENT: Extremely hard of hearing CV: Regular rate and rhythm Respiratory: Normal work of breathing on room air Abdomen: Soft, nontender and nondistended Neuro: Speech is difficult to understand, but he is able to make full sentences Extremities: Warm and well-perfused Psych: Pleasant  Laboratory: Most recent CBC Lab Results  Component Value Date   WBC 6.9 01/08/2023   HGB 8.6 (L)  01/08/2023   HCT 27.2 (L) 01/08/2023   MCV 87.2 01/08/2023   PLT 279 01/08/2023   Most recent BMP    Latest Ref Rng & Units 01/08/2023    7:45 AM  BMP  Glucose 70 - 99 mg/dL 89   BUN 8 - 23 mg/dL 47   Creatinine 4.09 - 1.24 mg/dL 8.11   Sodium 914 -  782 mmol/L 140   Potassium 3.5 - 5.1 mmol/L 4.3   Chloride 98 - 111 mmol/L 115   CO2 22 - 32 mmol/L 13   Calcium 8.9 - 10.3 mg/dL 8.7     Darral Dash, DO 01/08/2023, 9:12 AM  PGY-3, [facility] FPTS Intern pager: [phone], text pages welcome Secure chat group [facility] Teaching Service

## 2023-01-08 NOTE — Assessment & Plan Note (Signed)
Fairly severe.  Hemoglobin 8.8 this morning s/p 1U PRBC yesterday for hemoglobin 6.9. Concern remains for possible GI losses/possible malignancy. Long discussion with family (daughter and son at bedside) this morning, and they do not wish to pursue aggressive workup with colonoscopy, as he would not tolerate the prep well. Question if he could have a less invasive workup such as capsule endoscopy. -CBC this evening at 1800, and in the morning -Will receive outpatient IV iron transfusions -Continue ferrous sulfate 325 mg daily

## 2023-01-08 NOTE — Progress Notes (Signed)
Bladder scan performed, 233 ml present in bladder.  Will rescan in 6 hours, will cont to monitor.

## 2023-01-08 NOTE — Assessment & Plan Note (Signed)
Most likely in the setting of acute anemia/dehydration.  Dehydration likely due to decreased intake per report.  Patient received 500 mL bolus in the emergency department along with blood transfusion. -Monitor I's and O's -Encourage p.o. fluid intake given CHF status and fluid shortage -AM BMP -Hold Lasix until AKI resolves

## 2023-01-08 NOTE — Assessment & Plan Note (Signed)
Likely due to deconditioning and weakness from acute anemia while trying to transfer between chairs.  CT head without acute abnormality.  No concern for extremity fracture at this time. -PT OT eval

## 2023-01-09 ENCOUNTER — Observation Stay (HOSPITAL_BASED_OUTPATIENT_CLINIC_OR_DEPARTMENT_OTHER): Payer: Medicare HMO

## 2023-01-09 DIAGNOSIS — W19XXXA Unspecified fall, initial encounter: Secondary | ICD-10-CM | POA: Diagnosis not present

## 2023-01-09 DIAGNOSIS — N179 Acute kidney failure, unspecified: Secondary | ICD-10-CM | POA: Diagnosis not present

## 2023-01-09 DIAGNOSIS — R55 Syncope and collapse: Secondary | ICD-10-CM | POA: Diagnosis not present

## 2023-01-09 DIAGNOSIS — D508 Other iron deficiency anemias: Secondary | ICD-10-CM | POA: Diagnosis not present

## 2023-01-09 DIAGNOSIS — S12090A Other displaced fracture of first cervical vertebra, initial encounter for closed fracture: Secondary | ICD-10-CM | POA: Diagnosis not present

## 2023-01-09 LAB — CBC
HCT: 27.3 % — ABNORMAL LOW (ref 39.0–52.0)
Hemoglobin: 8.4 g/dL — ABNORMAL LOW (ref 13.0–17.0)
MCH: 26.8 pg (ref 26.0–34.0)
MCHC: 30.8 g/dL (ref 30.0–36.0)
MCV: 86.9 fL (ref 80.0–100.0)
Platelets: 263 10*3/uL (ref 150–400)
RBC: 3.14 MIL/uL — ABNORMAL LOW (ref 4.22–5.81)
RDW: 15.6 % — ABNORMAL HIGH (ref 11.5–15.5)
WBC: 7.5 10*3/uL (ref 4.0–10.5)
nRBC: 0 % (ref 0.0–0.2)

## 2023-01-09 LAB — ECHOCARDIOGRAM COMPLETE
AR max vel: 1.96 cm2
AV Area VTI: 2.21 cm2
AV Area mean vel: 1.9 cm2
AV Mean grad: 4.3 mm[Hg]
AV Peak grad: 7.9 mm[Hg]
AV Vena cont: 0.4 cm
Ao pk vel: 1.4 m/s
Area-P 1/2: 4.37 cm2
S' Lateral: 2.1 cm

## 2023-01-09 LAB — BASIC METABOLIC PANEL
Anion gap: 9 (ref 5–15)
BUN: 44 mg/dL — ABNORMAL HIGH (ref 8–23)
CO2: 13 mmol/L — ABNORMAL LOW (ref 22–32)
Calcium: 8.2 mg/dL — ABNORMAL LOW (ref 8.9–10.3)
Chloride: 113 mmol/L — ABNORMAL HIGH (ref 98–111)
Creatinine, Ser: 1.48 mg/dL — ABNORMAL HIGH (ref 0.61–1.24)
GFR, Estimated: 44 mL/min — ABNORMAL LOW (ref >60)
Glucose, Bld: 91 mg/dL (ref 70–99)
Potassium: 4.4 mmol/L (ref 3.5–5.1)
Sodium: 135 mmol/L (ref 135–145)

## 2023-01-09 MED ORDER — DICLOFENAC SODIUM 1 % EX GEL
2.0000 g | Freq: Four times a day (QID) | CUTANEOUS | Status: DC
  Administered 2023-01-09 – 2023-01-10 (×4): 2 g via TOPICAL
  Filled 2023-01-09: qty 100

## 2023-01-09 MED ORDER — TAMSULOSIN HCL 0.4 MG PO CAPS
0.4000 mg | ORAL_CAPSULE | Freq: Every day | ORAL | Status: DC
  Administered 2023-01-09 – 2023-01-10 (×2): 0.4 mg via ORAL
  Filled 2023-01-09 (×2): qty 1

## 2023-01-09 MED ORDER — FERROUS SULFATE 325 (65 FE) MG PO TABS
325.0000 mg | ORAL_TABLET | ORAL | Status: DC
  Administered 2023-01-09: 325 mg via ORAL
  Filled 2023-01-09: qty 1

## 2023-01-09 NOTE — Assessment & Plan Note (Addendum)
Not wearing soft collar, does not like it.

## 2023-01-09 NOTE — Progress Notes (Signed)
   01/09/23 2104  BiPAP/CPAP/SIPAP  BiPAP/CPAP/SIPAP Pt Type Adult  BiPAP/CPAP/SIPAP Resmed  Reason BIPAP/CPAP not in use Non-compliant  Patient Home Equipment No

## 2023-01-09 NOTE — Plan of Care (Signed)

## 2023-01-09 NOTE — Plan of Care (Signed)

## 2023-01-09 NOTE — Assessment & Plan Note (Addendum)
Neurologically intact today.  Awake and alert.  -Fall precautions -Plan to work with physical therapy today-with the knowledge that his baseline require some assistance and will return to ALF

## 2023-01-09 NOTE — Assessment & Plan Note (Addendum)
Fairly severe.  Hemoglobin this a.m. is 8.4. Was some concern for possible GI losses and or malignancy; CT angio obtained which did not show any bleeds. Family still in agreement that they do not want to pursue aggressive workup. -CBC this evening at 1800, and in the morning -Will receive outpatient IV iron transfusions -Continue ferrous sulfate 325 mg daily

## 2023-01-09 NOTE — Progress Notes (Signed)
Bladder scan performed, 300 in bladder, no foley per order, patient asymptomatic.  Will cont to monitor.

## 2023-01-09 NOTE — Progress Notes (Signed)
Daily Progress Note Intern Pager: [phone]  Patient name: Adrian [name] Medical record number: [id number] Date of birth: 12/04/1927 Age: 89 y.o. Gender: male  Primary Care Provider: [facility] Consultants: Neurosurgery Code Status: DNR/DNI   Pt Overview and Major Events to Date:  10/26: Admitted from ALF with fall from wheelchair to chair transfer; transfused 1U PRBCs   Assessment and Plan: Adrian [name] is a pleasant 89 year old male here with fall, and severe iron deficiency anemia.  Will place Foley catheter today and void trial when able.  Need to find out if facility will accept patient return with Foley in place. Assessment & Plan Iron deficiency anemia Fairly severe.  Hemoglobin this a.m. is 8.4. Was some concern for possible GI losses and or malignancy; CT angio obtained which did not show any bleeds. Family still in agreement that they do not want to pursue aggressive workup. -CBC this evening at 1800, and in the morning -Will receive outpatient IV iron transfusions -Continue ferrous sulfate 325 mg daily Fall Neurologically intact today.  Awake and alert.  -Fall precautions -Plan to work with physical therapy today-with the knowledge that his baseline require some assistance and will return to ALF AKI (acute kidney injury) (HCC) Creatinine very slowly improving, 1.48 this morning.  Very likely prerenal with postrenal/obstructive component as patient has needed in and out caths. - Patient will need Foley placed based on multiple bladder scans with > 100 mL.  Unsure how this will affect his return to facility; consider void trial as able. -Encourage p.o. fluid intake given CHF status and fluid shortage -AM BMP -Hold Lasix until AKI resolves Compression fracture of C1 vertebra (HCC) Not wearing soft collar, does not like it.   FEN/GI: Regular diet PPx: SCDs; high bleeding risk so will not order chemical VTE prophylaxis Dispo: Return to ALF when  clinically stable and improved.    Subjective:  Patient seen lying in bed this morning resting with son at bedside.  Son provides most of the information, the patient does state that he wants to go home as soon as possible.  Denies pain.  No further concerns at this time.  Objective: Temp:  [98.2 F (36.8 C)] 98.2 F (36.8 C) (10/27 2100) Pulse Rate:  [57-101] 57 (10/28 0806) Resp:  [16-18] 16 (10/28 0806) BP: (136-140)/(75-77) 136/75 (10/28 0806) SpO2:  [91 %-100 %] 91 % (10/28 0806) FiO2 (%):  [21 %] 21 % (10/27 2304) Physical Exam: General: Lying in bed, sleepy but awakens appropriately to verbal stimuli, appropriately communicative, NAD. Cardiovascular: RRR, no murmurs Respiratory: CTA bilaterally, no increased work of breathing on room air. Abdomen: Normoactive bowel sounds, soft, nontender, nondistended Extremities: Moves all extremities equally.  Skin is warm and dry.  Laboratory: Most recent CBC Lab Results  Component Value Date   WBC 7.5 01/09/2023   HGB 8.4 (L) 01/09/2023   HCT 27.3 (L) 01/09/2023   MCV 86.9 01/09/2023   PLT 263 01/09/2023   Most recent BMP    Latest Ref Rng & Units 01/09/2023    8:30 AM  BMP  Glucose 70 - 99 mg/dL 91   BUN 8 - 23 mg/dL 44   Creatinine 9.51 - 1.24 mg/dL 8.84   Sodium 166 - 063 mmol/L 135   Potassium 3.5 - 5.1 mmol/L 4.4   Chloride 98 - 111 mmol/L 113   CO2 22 - 32 mmol/L 13   Calcium 8.9 - 10.3 mg/dL 8.2     01/60 CTA GI bleed: 1.  No intraluminal contrast extravasation or other findings to localize GI bleeding. 2. Sigmoid diverticulosis without evidence of acute diverticulitis. 3. Large partially imaged hiatal hernia with intrathoracic position of the gastric body and fundus. 4. 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 Recommend follow-up ultrasound every 2 years. Severe atherosclerosis. 5. Coronary artery disease.  Cyndia Skeeters, DO 01/09/2023, 12:38 PM  PGY-1, [facility] Family  Medicine FPTS Intern pager: [phone], text pages welcome Secure chat group [facility] Teaching Service

## 2023-01-09 NOTE — Progress Notes (Signed)
*  PRELIMINARY RESULTS* Echocardiogram 2D Echocardiogram has been performed.  Adrian [name] 01/09/2023, 9:48 AM

## 2023-01-09 NOTE — Assessment & Plan Note (Addendum)
Creatinine very slowly improving, 1.48 this morning.  Very likely prerenal with postrenal/obstructive component as patient has needed in and out caths. - Patient will need Foley placed based on multiple bladder scans with > 100 mL.  Unsure how this will affect his return to facility; consider void trial as able. -Encourage p.o. fluid intake given CHF status and fluid shortage -AM BMP -Hold Lasix until AKI resolves

## 2023-01-10 LAB — CBC
HCT: 29.6 % — ABNORMAL LOW (ref 39.0–52.0)
Hemoglobin: 9.4 g/dL — ABNORMAL LOW (ref 13.0–17.0)
MCH: 27.6 pg (ref 26.0–34.0)
MCHC: 31.8 g/dL (ref 30.0–36.0)
MCV: 86.8 fL (ref 80.0–100.0)
Platelets: 288 10*3/uL (ref 150–400)
RBC: 3.41 MIL/uL — ABNORMAL LOW (ref 4.22–5.81)
RDW: 15.2 % (ref 11.5–15.5)
WBC: 8.9 10*3/uL (ref 4.0–10.5)
nRBC: 0 % (ref 0.0–0.2)

## 2023-01-10 LAB — BASIC METABOLIC PANEL
Anion gap: 11 (ref 5–15)
BUN: 40 mg/dL — ABNORMAL HIGH (ref 8–23)
CO2: 15 mmol/L — ABNORMAL LOW (ref 22–32)
Calcium: 8.6 mg/dL — ABNORMAL LOW (ref 8.9–10.3)
Chloride: 110 mmol/L (ref 98–111)
Creatinine, Ser: 1.47 mg/dL — ABNORMAL HIGH (ref 0.61–1.24)
GFR, Estimated: 44 mL/min — ABNORMAL LOW (ref >60)
Glucose, Bld: 98 mg/dL (ref 70–99)
Potassium: 4.6 mmol/L (ref 3.5–5.1)
Sodium: 136 mmol/L (ref 135–145)

## 2023-01-10 LAB — GLUCOSE, CAPILLARY: Glucose-Capillary: 76 mg/dL (ref 70–99)

## 2023-01-10 MED ORDER — FERROUS SULFATE 325 (65 FE) MG PO TABS: 325.0000 mg | ORAL_TABLET | ORAL | Status: DC

## 2023-01-10 MED ORDER — TAMSULOSIN HCL 0.4 MG PO CAPS: 0.4000 mg | ORAL_CAPSULE | Freq: Every day | ORAL | Status: AC

## 2023-01-10 MED ORDER — DICLOFENAC SODIUM 1 % EX GEL: 2.0000 g | Freq: Four times a day (QID) | CUTANEOUS | Status: AC

## 2023-01-10 MED ORDER — ENSURE ENLIVE PO LIQD: 237.0000 mL | Freq: Two times a day (BID) | ORAL | Status: AC

## 2023-01-10 NOTE — Discharge Summary (Addendum)
Family Medicine Teaching [facility] Discharge Summary  Patient name: Adrian [name] Medical record number: [id number] Date of birth: 1927-12-13 Age: 89 y.o. Gender: male Date of Admission: 01/07/2023  Date of Discharge: 01/10/23 Admitting Physician: Gerrit Heck, DO  Primary Care Provider: [facility] Consultants: None  Indication for Hospitalization: Symptomatic Anemia  Discharge Diagnoses/Problem List:  Principal Problem for Admission: Symptomatic Anemia Other Problems addressed during stay:  Principal Problem:   Iron deficiency anemia Active Problems:   Fall   AKI (acute kidney injury) (HCC)   Compression fracture of C1 vertebra [facility])    Brief Hospital Course:  Adrian [name] is a 88 y.o. male with a past medical history of A-fib, mitral regurg status post mitral valve clipping, OSA, history of intracranial hemorrhage on Coumadin, GERD who was admitted to the [facility] Medicine Teaching Service at [facility] for acute anemia found after a fall. Hospital course is outlined below by problem.   Anemia Patient found to have severe iron deficiency anemia, requiring a unit of PRBCs on admission.  Hemoglobin 6.9 initially with a ferritin of 14, a stat ratio 5, and an iron of 20. With shared decision making, did not want to move forward with colonoscopy or invasive procedures to characterize the source of the bleed. CT abdomen/pelvis angio negative for acute bleed. His Hgb was stable to 9.4 on discharge.  AKI  Urinary Retention  Initially, the patient was found to have a creatinine of 2 that seem to be prerenal in nature due to dehydration.  A bolus of 500 mL of which improved his creatinine overall to 1.5. Patient had multiple I/Os after retaining urine, required foley placement ultimately. At discharge he was tolerating the foley cath and flomax. Will have void trial at ALF.  Fall Patient had a fall when he was trying to transfer from wheelchair to bed likely  secondary to deconditioning and weakness from anemia that was found while in the ED.  CT head was negative. Worked with physical and occupational therapy prior to disposition.   Occult C1 fracture Chronic compression fracture of C1 seen on CT C-spine with longstanding neck pain.  Neurosurgery recommended soft c-collar without need for surgical intervention.   Other conditions that were chronic and stable.  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 Foley care and voiding trial needed at ALF Aspirin held due to concern for chronic bleed, family declined further workup. Have risk/benefit discussion in outpatient setting Weight remained stable - held Lasix inpatient, consider resuming at discharge,  Disposition: ALF  Discharge Condition: Stable  Discharge Exam:  Vitals:   01/10/23 0512 01/10/23 0737  BP: (!) 140/74 (!) 146/61  Pulse: 66 73  Resp: 19 17  Temp: 97.7 F (36.5 C) 97.6 F (36.4 C)  SpO2: 94% 95%   General: Alert and orient to person, place and situation. No distress Cardiac: RRR, no m/r/g Respiratory: CTAB, no increased WOB Extremities: 2+ radial pulses. Cap refill WNL  Significant Procedures: Transfused 1 unit PRBCs  Significant Labs and Imaging:  Recent Labs  Lab 01/08/23 2035 01/09/23 0830 01/10/23 0640  WBC 9.4 7.5 8.9  HGB 9.2* 8.4* 9.4*  HCT 29.3* 27.3* 29.6*  PLT 300 263 288   Recent Labs  Lab 01/09/23 0830 01/10/23 0640  NA 135 136  K 4.4 4.6  CL 113* 110  CO2 13* 15*  GLUCOSE  91 98  BUN 44* 40*  CREATININE 1.48* 1.47*  CALCIUM 8.2* 8.6*    Discharge Medications:  Allergies as of 01/10/2023   No Active Allergies      Medication List     STOP taking these medications    aspirin EC 81 MG tablet   furosemide 20 MG tablet Commonly known as: LASIX   penicillin v potassium 250  MG tablet Commonly known as: VEETID       TAKE these medications    acetaminophen 500 MG tablet Commonly known as: TYLENOL Take 500 mg by mouth in the morning, at noon, and at bedtime.   Calcium 500 +D 500-10 MG-MCG Tabs Generic drug: Calcium Carb-Cholecalciferol Take 1 tablet by mouth daily.   cholecalciferol 25 MCG (1000 UNIT) tablet Commonly known as: VITAMIN D3 Take 1,000 Units by mouth daily.   Daily Vite Tabs Take 1 tablet by mouth daily.   diclofenac Sodium 1 % Gel Commonly known as: VOLTAREN Apply 2 g topically 4 (four) times daily.   feeding supplement Liqd Take 237 mLs by mouth 2 (two) times daily between meals.   ferrous sulfate 325 (65 FE) MG tablet Take 1 tablet (325 mg total) by mouth every other day. Start taking on: January 11, 2023   gabapentin 300 MG capsule Commonly known as: NEURONTIN Take 300-600 mg by mouth See admin instructions. Give 300 mg (1 capsule) in the mornings and 600 mg (2 capsules) at bedtime.   guaiFENesin 600 MG 12 hr tablet Commonly known as: MUCINEX Take 600 mg by mouth 2 (two) times daily.   Lecithin 1200 MG Caps Take 1,200 mg by mouth daily.   montelukast 10 MG tablet Commonly known as: SINGULAIR Take 10 mg by mouth daily.   pantoprazole 40 MG tablet Commonly known as: PROTONIX TAKE 1 TABLET BY MOUTH DAILY. --DX: GERD   Phillips Colon Health Caps Take 1 capsule by mouth daily.   PreserVision AREDS 2 Caps Take 1 capsule by mouth 2 (two) times daily.   rivastigmine 3 MG capsule Commonly known as: EXELON Take 3 mg by mouth 2 (two) times daily.   Saw Palmetto Caps Take 600 mg by mouth daily.   tamsulosin 0.4 MG Caps capsule Commonly known as: FLOMAX Take 1 capsule (0.4 mg total) by mouth daily.   traMADol 50 MG tablet Commonly known as: ULTRAM Take 25 mg by mouth 2 (two) times daily as needed (pain, headache).        Discharge Instructions: Please refer to Patient Instructions section of EMR for full  details.  Patient was counseled important signs and symptoms that should prompt return to medical care, changes in medications, dietary instructions, activity restrictions, and follow up appointments.   Follow-Up Appointments:  Follow-up Information     [facility] Follow up.   Specialty: Geriatric Medicine Contact information: [address] [place] [address] [phone]                 Darral Dash, DO 01/10/2023, 4:41 PM PGY-1, [facility]   Upper Level Addendum:  I have seen and evaluated this patient along with Dr. Hyacinth Meeker and reviewed the above note, making necessary revisions.  Darral Dash, DO 01/10/2023, 4:41 PM PGY-3,  Family Medicine

## 2023-01-10 NOTE — TOC Transition Note (Signed)
Transition of Care [facility]) - CM/SW Discharge Note   Patient Details  Name: Adrian [name] MRN: [id number] Date of Birth: 1927/07/16  Transition of Care [facility]) CM/SW Contact:  Lorri Frederick, LCSW Phone Number: 01/10/2023, 12:29 PM   Clinical Narrative:   Pt discharging to [facility].  RN call report to [phone].  Family in room planning to transport pt.      Final next level of care: Assisted Living Barriers to Discharge: No Barriers Identified   Patient Goals and CMS Choice   Choice offered to / list presented to : Adult Children (sons Gerlene Burdock and Rosalia Hammers, daughter Thurston Hole)  Discharge Placement                Patient chooses bed at:  [facility]) Patient to be transferred to facility by: family: son Gerlene Burdock, daughter Thurston Hole Name of family member notified: Gerlene Burdock and Thurston Hole in room Patient and family notified of of transfer: 01/10/23  Discharge Plan and Services Additional resources added to the After Visit Summary for   In-house Referral: Clinical Social Work   Post Acute Care Choice: Home Health                               Social Determinants of Health (SDOH) Interventions SDOH Screenings   Food Insecurity: No Food Insecurity (01/07/2023)  Housing: Low Risk  (01/07/2023)  Transportation Needs: No Transportation Needs (01/07/2023)  Utilities: Not At Risk (01/07/2023)  Social Connections: Unknown (07/27/2021)   Received from [facility]  Tobacco Use: Low Risk  (01/07/2023)     Readmission Risk Interventions     No data to display

## 2023-01-10 NOTE — Progress Notes (Signed)
Physical Therapy Treatment Patient Details Name: Adrian [name] MRN: [id number] DOB: 06-14-1927 Today's Date: 01/10/2023   History of Present Illness Pt is a 89 y.o. male presenting 10/26 from [facility] with mechanical fall from wheelchair to recliner. Found to have severe iron deficiency anemia, AKI, chronic C1 fx. PMH significant for afib, mitral regurgitation s/p mitral valve clip, OSA, intracranial hemorrhage, GERD    PT Comments  Patient resting in recliner with family at bedside. Pt c/o neck pain and discomfort sitting in the recliner requesting return to bed. Mod assist required with cues for technique to power up and steady balance in standing. Mod assist to guide RW for turn chair>bed and to maintain upright posture. Pt completed additional stand and side steps along EOB to reposition and place bed pad. Mod assist to move sit>sidelying and min assist to roll supine and reposition to center self in bed. RN provided topical pain gel during session as pt c/o neck pain. EOS pt moved to partial chair position and call bell within reach. Will continue to progress as able.    If plan is discharge home, recommend the following: A lot of help with walking and/or transfers;A lot of help with bathing/dressing/bathroom;Assistance with cooking/housework;Direct supervision/assist for medications management;Direct supervision/assist for financial management;Assist for transportation;Help with stairs or ramp for entrance;Supervision due to cognitive status   Can travel by private vehicle        Equipment Recommendations  None recommended by PT    Recommendations for Other Services       Precautions / Restrictions Precautions Precautions: Fall Precaution Comments: admitted after a fall Required Braces or Orthoses: Cervical Brace Cervical Brace: Soft collar;For comfort ("as tolerated") Restrictions Weight Bearing Restrictions: No     Mobility  Bed Mobility Overal bed mobility: Needs  Assistance Bed Mobility: Rolling, Sit to Sidelying Rolling: Min assist       Sit to sidelying: Mod assist, Used rails General bed mobility comments: Cues to reach for bed rail and Mod assist to bring LE's back onto bed. Min assist to roll to supine from side and reposition to center self in bed. +2 to boost superiorly in bed.    Transfers Overall transfer level: Needs assistance Equipment used: Rolling walker (2 wheels) Transfers: Sit to/from Stand, Bed to chair/wheelchair/BSC Sit to Stand: Mod assist, From elevated surface   Step pivot transfers: Mod assist       General transfer comment: Mod assist to rise with cues for hand placement on armrest for power up from chair. Mod assist to steady balance in standing with RW. Pt required Mod assist to guide RW and maintain upright posture.    Ambulation/Gait                   Stairs             Wheelchair Mobility     Tilt Bed    Modified Rankin (Stroke Patients Only)       Balance Overall balance assessment: Needs assistance Sitting-balance support: Feet supported, Single extremity supported Sitting balance-Leahy Scale: Poor Sitting balance - Comments: CGA for sitting balance Postural control: Posterior lean Standing balance support: Bilateral upper extremity supported, During functional activity Standing balance-Leahy Scale: Poor Standing balance comment: reliant on RW for support                            Cognition Arousal: Alert Behavior During Therapy: Flat affect, WFL for tasks assessed/performed Overall Cognitive  Status: Within Functional Limits for tasks assessed                                          Exercises      General Comments General comments (skin integrity, edema, etc.): VSS on RA      Pertinent Vitals/Pain Pain Assessment Pain Assessment: Faces Faces Pain Scale: Hurts whole lot Pain Location: right side of neck Pain Descriptors / Indicators:  Discomfort, Grimacing, Guarding Pain Intervention(s): Limited activity within patient's tolerance, Monitored during session, Repositioned, Patient requesting pain meds-RN notified, RN gave pain meds during session    Home Living                          Prior Function            PT Goals (current goals can now be found in the care plan section) Acute Rehab PT Goals Patient Stated Goal: return to [place] PT Goal Formulation: With patient/family Time For Goal Achievement: 01/22/23 Potential to Achieve Goals: Good Progress towards PT goals: Progressing toward goals    Frequency    Min 1X/week      PT Plan      Co-evaluation              AM-PAC PT "6 Clicks" Mobility   Outcome Measure  Help needed turning from your back to your side while in a flat bed without using bedrails?: A Lot Help needed moving from lying on your back to sitting on the side of a flat bed without using bedrails?: A Lot Help needed moving to and from a bed to a chair (including a wheelchair)?: A Lot Help needed standing up from a chair using your arms (e.g., wheelchair or bedside chair)?: A Lot Help needed to walk in hospital room?: Total Help needed climbing 3-5 steps with a railing? : Total 6 Click Score: 10    End of Session Equipment Utilized During Treatment: Gait belt;Cervical collar Activity Tolerance: Patient tolerated treatment well;Patient limited by pain Patient left: in bed;with call bell/phone within reach;with family/visitor present;with bed alarm set Nurse Communication: Mobility status PT Visit Diagnosis: Other abnormalities of gait and mobility (R26.89);Muscle weakness (generalized) (M62.81);Pain Pain - Right/Left: Right Pain - part of body:  (neck)     Time: 1914-7829 PT Time Calculation (min) (ACUTE ONLY): 24 min  Charges:    $Therapeutic Activity: 23-37 mins PT General Charges $$ ACUTE PT VISIT: 1 Visit                     Wynn Maudlin,  DPT Acute Rehabilitation Services Office [phone]  01/10/23 12:48 PM

## 2023-01-10 NOTE — TOC Initial Note (Signed)
Transition of Care [facility]) - Initial/Assessment Note    Patient Details  Name: Adrian [name] MRN: [id number] Date of Birth: 04/08/1927  Transition of Care [facility]) CM/SW Contact:    Lorri Frederick, LCSW Phone Number: 01/10/2023, 10:26 AM  Clinical Narrative:    Pt oriented x1 but is talkative while CSW was present.  Pt three children in room: sons Gerlene Burdock and Rosalia Hammers, daughter Thurston Hole, and they provide all information.  Pt from [facility], they have already spoken to [place] [facility] regarding pt returning with [facility] services.    CSW spoke with [facility] who does confirm pt can return with HH, [facility] is their provider.  Also need FL2, HH orders, DC summary faxed to [phone].                 Expected Discharge Plan: Assisted Living Barriers to Discharge: No Barriers Identified   Patient Goals and CMS Choice     Choice offered to / list presented to : Adult Children (sons Gerlene Burdock and Rosalia Hammers, daughter Thurston Hole)      Expected Discharge Plan and Services In-house Referral: Clinical Social Work   Post Acute Care Choice: Home Health Living arrangements for the past 2 months: Assisted Living Facility                                      Prior Living Arrangements/Services Living arrangements for the past 2 months: Assisted Living Facility Lives with:: Facility Resident Patient language and need for interpreter reviewed:: Yes        Need for Family Participation in Patient Care: Yes (Comment) Care giver support system in place?: Yes (comment) Current home services: Other (comment) (none) Criminal Activity/Legal Involvement Pertinent to Current Situation/Hospitalization: No - Comment as needed  Activities of Daily Living   ADL Screening (condition at time of admission) Independently performs ADLs?: No Does the patient have a NEW difficulty with bathing/dressing/toileting/self-feeding that is expected to last >3 days?: No Does the patient have a NEW  difficulty with getting in/out of bed, walking, or climbing stairs that is expected to last >3 days?: No Does the patient have a NEW difficulty with communication that is expected to last >3 days?: No Is the patient deaf or have difficulty hearing?: Yes Does the patient have difficulty seeing, even when wearing glasses/contacts?: No Does the patient have difficulty concentrating, remembering, or making decisions?: No  Permission Sought/Granted                  Emotional Assessment Appearance:: Appears stated age Attitude/Demeanor/Rapport: Engaged Affect (typically observed): Pleasant Orientation: : Oriented to Self      Admission diagnosis:  Normocytic anemia [D64.9] AKI (acute kidney injury) (HCC) [N17.9] Fall, initial encounter [W19.XXXA] Acute anemia [D64.9] Patient Active Problem List   Diagnosis Date Noted   Fall 01/08/2023   AKI (acute kidney injury) (HCC) 01/08/2023   Iron deficiency anemia 01/08/2023   Compression fracture of C1 vertebra (HCC) 01/08/2023   Bilateral primary osteoarthritis of knee 06/01/2022   Non-rheumatic mitral regurgitation 06/18/2020   S/P mitral valve clip implantation 06/18/2020   History of intracranial hemorrhage    OSA (obstructive sleep apnea)    GERD (gastroesophageal reflux disease)    Chronic diastolic heart failure (HCC) 05/24/2020   Persistent atrial fibrillation (HCC) 05/24/2020   PCP:  Almetta Lovely, [facility] Pharmacy:   [id number] - [place], [place] - [address] AT [facility] [address] [facility] [address] [place] [address] Phone: [phone] Fax: [phone]  [facility], [place] - [address] [place] [address] Phone: [phone] Fax: [phone]     Social Determinants of Health (SDOH) Social History: SDOH Screenings   Food Insecurity: No Food Insecurity (01/07/2023)  Housing: Low Risk  (01/07/2023)  Transportation  Needs: No Transportation Needs (01/07/2023)  Utilities: Not At Risk (01/07/2023)  Social Connections: Unknown (07/27/2021)   [facility]  Tobacco Use: Low Risk  (01/07/2023)   SDOH Interventions:     Readmission Risk Interventions     No data to display

## 2023-01-10 NOTE — Progress Notes (Signed)
Occupational Therapy Treatment Patient Details Name: Adrian [name] MRN: [id number] DOB: October 16, 1927 Today's Date: 01/10/2023   History of present illness Pt is a 89 y.o. male presenting 10/26 from [facility] with mechanical fall from wheelchair to recliner. Found to have severe iron deficiency anemia, AKI, chronic C1 fx. PMH significant for afib, mitral regurgitation s/p mitral valve clip, OSA, intracranial hemorrhage, GERD   OT comments  Patient received in supine and agreeable to OOB for breakfast. Patient demonstrated gains with bed mobility and transfers with mod assists of one to get to EOB and to perform step pivot transfer to recliner. Patient able to perform grooming tasks seated in recliner. Discharge recommendations continue to be appropriate. Acute OT to continue follow for functional transfers and self care.       If plan is discharge home, recommend the following:  A lot of help with bathing/dressing/bathroom;A lot of help with walking and/or transfers;Two people to help with walking and/or transfers;Two people to help with bathing/dressing/bathroom;Assistance with cooking/housework;Assist for transportation;Help with stairs or ramp for entrance   Equipment Recommendations  Other (comment) (RW)    Recommendations for Other Services      Precautions / Restrictions Precautions Precautions: Fall Precaution Comments: admitted after a fall Required Braces or Orthoses: Cervical Brace Cervical Brace: Soft collar;For comfort ("as tolerated") Restrictions Weight Bearing Restrictions: No       Mobility Bed Mobility Overal bed mobility: Needs Assistance Bed Mobility: Rolling, Sidelying to Sit Rolling: Min assist Sidelying to sit: Min assist       General bed mobility comments: bed pad used to assist with scooting towards EOB    Transfers Overall transfer level: Needs assistance Equipment used: Rolling walker (2 wheels) Transfers: Sit to/from Stand, Bed to  chair/wheelchair/BSC Sit to Stand: Mod assist, From elevated surface     Step pivot transfers: Mod assist     General transfer comment: bed raised to assist with sit to stand, assistance for balance and walker management     Balance Overall balance assessment: Needs assistance Sitting-balance support: Feet supported, Single extremity supported Sitting balance-Leahy Scale: Poor Sitting balance - Comments: decreased fear of falling demonstrated, CGA for sitting balance Postural control: Posterior lean Standing balance support: Bilateral upper extremity supported, During functional activity Standing balance-Leahy Scale: Poor Standing balance comment: reliant on RW for support                           ADL either performed or assessed with clinical judgement   ADL Overall ADL's : Needs assistance/impaired     Grooming: Wash/dry hands;Wash/dry face;Oral care;Set up;Sitting Grooming Details (indicate cue type and reason): in recliner                 Toilet Transfer: Moderate assistance;Rolling walker (2 wheels) Toilet Transfer Details (indicate cue type and reason): simulated to recliner                Extremity/Trunk Assessment              Vision       Perception     Praxis      Cognition Arousal: Alert Behavior During Therapy: Flat affect, WFL for tasks assessed/performed Overall Cognitive Status: Within Functional Limits for tasks assessed  Exercises      Shoulder Instructions       General Comments VSS on RA, son present    Pertinent Vitals/ Pain       Pain Assessment Pain Assessment: Faces Faces Pain Scale: Hurts little more Pain Location: right side of neck Pain Descriptors / Indicators: Discomfort, Grimacing, Guarding Pain Intervention(s): Monitored during session, Repositioned, Limited activity within patient's tolerance  Home Living                                           Prior Functioning/Environment              Frequency  Min 1X/week        Progress Toward Goals  OT Goals(current goals can now be found in the care plan section)  Progress towards OT goals: Progressing toward goals  Acute Rehab OT Goals Patient Stated Goal: go home OT Goal Formulation: With patient Time For Goal Achievement: 01/22/23 Potential to Achieve Goals: Good ADL Goals Pt Will Perform Grooming: with set-up;sitting Pt Will Perform Upper Body Bathing: with modified independence;sitting Pt Will Perform Lower Body Bathing: with mod assist;sit to/from stand Pt Will Transfer to Toilet: with min assist;stand pivot transfer  Plan      Co-evaluation                 AM-PAC OT "6 Clicks" Daily Activity     Outcome Measure   Help from another person eating meals?: A Little Help from another person taking care of personal grooming?: A Little Help from another person toileting, which includes using toliet, bedpan, or urinal?: A Lot Help from another person bathing (including washing, rinsing, drying)?: A Lot Help from another person to put on and taking off regular upper body clothing?: A Little Help from another person to put on and taking off regular lower body clothing?: A Lot 6 Click Score: 15    End of Session Equipment Utilized During Treatment: Gait belt;Rolling walker (2 wheels);Cervical collar  OT Visit Diagnosis: Unsteadiness on feet (R26.81);Muscle weakness (generalized) (M62.81);Other symptoms and signs involving cognitive function;History of falling (Z91.81)   Activity Tolerance Patient tolerated treatment well   Patient Left in chair;with call bell/phone within reach;with chair alarm set;with family/visitor present;with nursing/sitter in room   Nurse Communication Mobility status        Time: 4166-0630 OT Time Calculation (min): 20 min  Charges: OT General Charges $OT Visit: 1 Visit OT Treatments $Self  Care/Home Management : 8-22 mins  Alfonse Flavors, OTA Acute Rehabilitation Services  Office [phone]   Dewain Penning 01/10/2023, 9:23 AM

## 2023-01-10 NOTE — NC FL2 (Signed)
[place] MEDICAID FL2 LEVEL OF CARE FORM     IDENTIFICATION  Patient Name: Adrian [name] Birthdate: 1927/11/21 Sex: male Admission Date (Current Location): 01/07/2023  [facility] and [place] Number:  Producer, television/film/video and Address:  [facility], [address] [place], [place] [address]      Provider Number: [id number]  Attending Physician Name and Address:  Billey Co, MD  Relative Name and Phone Number:  Lando, Gluth   [phone]    Current Level of Care: Hospital Recommended Level of Care: Assisted Living Facility [facility]) Prior Approval Number:    Date Approved/Denied:   PASRR Number:    Discharge Plan: Other (Comment) ([facility])    Current Diagnoses: Patient Active Problem List   Diagnosis Date Noted   Fall 01/08/2023   AKI (acute kidney injury) (HCC) 01/08/2023   Iron deficiency anemia 01/08/2023   Compression fracture of C1 vertebra (HCC) 01/08/2023   Bilateral primary osteoarthritis of knee 06/01/2022   Non-rheumatic mitral regurgitation 06/18/2020   S/P mitral valve clip implantation 06/18/2020   History of intracranial hemorrhage    OSA (obstructive sleep apnea)    GERD (gastroesophageal reflux disease)    Chronic diastolic heart failure (HCC) 05/24/2020   Persistent atrial fibrillation (HCC) 05/24/2020    Orientation RESPIRATION BLADDER Height & Weight     Self  Normal Indwelling catheter, Incontinent Weight:   Height:     BEHAVIORAL SYMPTOMS/MOOD NEUROLOGICAL BOWEL NUTRITION STATUS      Continent Diet (regular diet)  AMBULATORY STATUS COMMUNICATION OF NEEDS Skin   Extensive Assist Verbally Skin abrasions                       Personal Care Assistance Level of Assistance  Bathing, Feeding, Dressing, Total care Bathing Assistance: Maximum assistance Feeding assistance: Limited assistance Dressing Assistance: Maximum assistance Total Care Assistance: Limited assistance    Functional Limitations Info  Sight, Hearing, Speech Sight Info: Adequate Hearing Info: Impaired Speech Info: Adequate    SPECIAL CARE FACTORS FREQUENCY  PT (By licensed PT), OT (By licensed OT)     PT Frequency: evaluate and treat OT Frequency: evaluate and treat            Contractures Contractures Info: Not present    Additional Factors Info  Code Status, Allergies Code Status Info: DNR Allergies Info: NKA           Current Medications (01/10/2023):  This is the current hospital active medication list Current Facility-Administered Medications  Medication Dose Route Frequency Provider Last Rate Last Admin   acetaminophen (TYLENOL) tablet 650 mg  650 mg Oral Q6H PRN Evette Georges, MD   650 mg at 01/09/23 2035   diclofenac Sodium (VOLTAREN) 1 % topical gel 2 g  2 g Topical QID Darral Dash, DO   2 g at 01/10/23 1011   feeding supplement (ENSURE ENLIVE / ENSURE PLUS) liquid 237 mL  237 mL Oral BID BM Dameron, Marisa, DO   237 mL at 01/09/23 1521   ferrous sulfate tablet 325 mg  325 mg Oral Hedda Slade, MD   325 mg at 01/09/23 1313   gabapentin (NEURONTIN) capsule 300 mg  300 mg Oral q morning Evette Georges, MD   300 mg at 01/09/23 1124   And   gabapentin (NEURONTIN) capsule 600 mg  600 mg Oral QHS Evette Georges, MD   600 mg at 01/09/23 2035   guaiFENesin (MUCINEX) 12 hr tablet  600 mg  600 mg Oral BID Evette Georges, MD   600 mg at 01/09/23 2035   montelukast (SINGULAIR) tablet 10 mg  10 mg Oral Daily Evette Georges, MD   10 mg at 01/09/23 1125   pantoprazole (PROTONIX) EC tablet 40 mg  40 mg Oral Daily Evette Georges, MD   40 mg at 01/09/23 1125   rivastigmine (EXELON) capsule 3 mg  3 mg Oral BID Evette Georges, MD   3 mg at 01/09/23 2035   tamsulosin (FLOMAX) capsule 0.4 mg  0.4 mg Oral Daily Billey Co, MD   0.4 mg at 01/09/23 1313   traMADol (ULTRAM) tablet 25 mg  25 mg Oral BID PRN Evette Georges, MD   25 mg at 01/08/23 2017     Discharge Medications: Please  see discharge summary for a list of discharge medications.  Relevant Imaging Results:  Relevant Lab Results:   Additional Information SSN: [id number]  Lorri Frederick, LCSW

## 2023-01-10 NOTE — Discharge Instructions (Signed)
It was a pleasure to care for you in the hospital and we are so glad that you are feeling better!  While in the hospital we treated you for a kidney injury and acute anemia. You will be going home with a new medication to help your body make urine, so please read your medication list carefully. We also gave you some blood to help resolve your anemia, and have added an iron supplement on to help keep your blood counts in a better range.  We also discovered an unhealed fracture in your neck. Because of your age and comorbid conditions, it would not be appropriate to operate and repair the fracture. You should avoid any strenuous activities that could result in further injuries to your neck, and should inform any health care providers you have of this injury.  You will need to have follow-up with your regular doctor to discuss this hospital visit.  If you experience any of the following symptoms, please return to the hospital: - Chest pain -Shortness of breath -New or worsening weakness. -Blood in the toilet bowl or in your stool -not being able to pee  For any other questions or concerns or medication refill needs please contact your primary care provider.

## 2023-02-11 ENCOUNTER — Emergency Department (HOSPITAL_BASED_OUTPATIENT_CLINIC_OR_DEPARTMENT_OTHER)
Admission: EM | Admit: 2023-02-11 | Discharge: 2023-02-11 | Disposition: A | Discharge status: Home / Self Care | Payer: Medicare HMO | Attending: Emergency Medicine | Admitting: Emergency Medicine

## 2023-02-11 ENCOUNTER — Emergency Department (HOSPITAL_BASED_OUTPATIENT_CLINIC_OR_DEPARTMENT_OTHER): Payer: Medicare HMO

## 2023-02-11 DIAGNOSIS — R339 Retention of urine, unspecified: Secondary | ICD-10-CM | POA: Diagnosis not present

## 2023-02-11 DIAGNOSIS — W19XXXA Unspecified fall, initial encounter: Secondary | ICD-10-CM | POA: Insufficient documentation

## 2023-02-11 DIAGNOSIS — N179 Acute kidney failure, unspecified: Secondary | ICD-10-CM | POA: Insufficient documentation

## 2023-02-11 DIAGNOSIS — R410 Disorientation, unspecified: Secondary | ICD-10-CM | POA: Diagnosis not present

## 2023-02-11 DIAGNOSIS — R7309 Other abnormal glucose: Secondary | ICD-10-CM | POA: Insufficient documentation

## 2023-02-11 DIAGNOSIS — R4182 Altered mental status, unspecified: Secondary | ICD-10-CM | POA: Diagnosis present

## 2023-02-11 LAB — CBC WITH DIFFERENTIAL/PLATELET
Abs Immature Granulocytes: 0.02 10*3/uL (ref 0.00–0.07)
Basophils Absolute: 0.1 10*3/uL (ref 0.0–0.1)
Basophils Relative: 1 %
Eosinophils Absolute: 0.4 10*3/uL (ref 0.0–0.5)
Eosinophils Relative: 7 %
HCT: 27.1 % — ABNORMAL LOW (ref 39.0–52.0)
Hemoglobin: 8.2 g/dL — ABNORMAL LOW (ref 13.0–17.0)
Immature Granulocytes: 0 %
Lymphocytes Relative: 10 %
Lymphs Abs: 0.6 10*3/uL — ABNORMAL LOW (ref 0.7–4.0)
MCH: 27.3 pg (ref 26.0–34.0)
MCHC: 30.3 g/dL (ref 30.0–36.0)
MCV: 90.3 fL (ref 80.0–100.0)
Monocytes Absolute: 0.7 10*3/uL (ref 0.1–1.0)
Monocytes Relative: 12 %
Neutro Abs: 3.9 10*3/uL (ref 1.7–7.7)
Neutrophils Relative %: 70 %
Platelets: 206 10*3/uL (ref 150–400)
RBC: 3 MIL/uL — ABNORMAL LOW (ref 4.22–5.81)
RDW: 17.2 % — ABNORMAL HIGH (ref 11.5–15.5)
WBC: 5.6 10*3/uL (ref 4.0–10.5)
nRBC: 0 % (ref 0.0–0.2)

## 2023-02-11 LAB — RAPID URINE DRUG SCREEN, HOSP PERFORMED
Amphetamines: NOT DETECTED
Barbiturates: NOT DETECTED
Benzodiazepines: NOT DETECTED
Cocaine: NOT DETECTED
Opiates: NOT DETECTED
Tetrahydrocannabinol: NOT DETECTED

## 2023-02-11 LAB — URINALYSIS, ROUTINE W REFLEX MICROSCOPIC
Bilirubin Urine: NEGATIVE
Glucose, UA: NEGATIVE mg/dL
Hgb urine dipstick: NEGATIVE
Ketones, ur: NEGATIVE mg/dL
Leukocytes,Ua: NEGATIVE
Nitrite: NEGATIVE
Specific Gravity, Urine: 1.018 (ref 1.005–1.030)
pH: 5 (ref 5.0–8.0)

## 2023-02-11 LAB — COMPREHENSIVE METABOLIC PANEL
ALT: 10 U/L (ref 0–44)
AST: 12 U/L — ABNORMAL LOW (ref 15–41)
Albumin: 3.7 g/dL (ref 3.5–5.0)
Alkaline Phosphatase: 91 U/L (ref 38–126)
Anion gap: 9 (ref 5–15)
BUN: 49 mg/dL — ABNORMAL HIGH (ref 8–23)
CO2: 18 mmol/L — ABNORMAL LOW (ref 22–32)
Calcium: 8.3 mg/dL — ABNORMAL LOW (ref 8.9–10.3)
Chloride: 109 mmol/L (ref 98–111)
Creatinine, Ser: 1.9 mg/dL — ABNORMAL HIGH (ref 0.61–1.24)
GFR, Estimated: 32 mL/min — ABNORMAL LOW (ref >60)
Glucose, Bld: 92 mg/dL (ref 70–99)
Potassium: 4.9 mmol/L (ref 3.5–5.1)
Sodium: 136 mmol/L (ref 135–145)
Total Bilirubin: 0.4 mg/dL (ref <1.2)
Total Protein: 6.2 g/dL — ABNORMAL LOW (ref 6.5–8.1)

## 2023-02-11 LAB — ETHANOL: Alcohol, Ethyl (B): <10 mg/dL (ref <10)

## 2023-02-11 LAB — CBG MONITORING, ED: Glucose-Capillary: 93 mg/dL (ref 70–99)

## 2023-02-11 MED ORDER — LACTATED RINGERS IV BOLUS
1000.0000 mL | Freq: Once | INTRAVENOUS | Status: AC
  Administered 2023-02-11: 1000 mL via INTRAVENOUS

## 2023-02-11 NOTE — ED Triage Notes (Signed)
Per son at bedside, pt has had some behavioral changes at his care facility and a recent fall with no head injury or obvious signs of pain at present resulting.  Per UA done on Nov 16, small amount of yeast noted in urine at that time - not treated with any Rx.

## 2023-02-11 NOTE — ED Provider Notes (Signed)
EMERGENCY DEPARTMENT AT [facility] Provider Note   CSN: [id number] Arrival date & time: 02/11/23  1013     History {Add pertinent medical, surgical, social history, OB history to HPI:1} Chief Complaint  Patient presents with   Altered Mental Status    Adrian [name] is a 89 y.o. male.  89 year old male with a history of atrial fibrillation not on AC due to ICH and anemia who presents to the emergency department with behavioral changes.  2 days ago started having behavioral changes where he was acting more belligerent in his facility ([facility]).  No other symptoms.  Last night tried to get up without assistance and had an unwitnessed fall.  No prolonged downtime.  Does not believe that he hit his head.  Says that staff was there almost immediately afterwards and called 911 but he refused transport to the hospital.  Brought in this morning for evaluation.  According to his son they did change his tramadol to scheduled rather than as needed on 01/31/2023.       Home Medications Prior to Admission medications   Medication Sig Start Date End Date Taking? Authorizing Provider  acetaminophen (TYLENOL) 500 MG tablet Take 500 mg by mouth in the morning, at noon, and at bedtime.    [provider]  Calcium Carb-Cholecalciferol (CALCIUM 500 +D) 500-10 MG-MCG TABS Take 1 tablet by mouth daily.    [provider]  cholecalciferol (VITAMIN D3) 25 MCG (1000 UNIT) tablet Take 1,000 Units by mouth daily.    [provider]  diclofenac Sodium (VOLTAREN) 1 % GEL Apply 2 g topically 4 (four) times daily. 01/10/23   Dameron, Nolberto Hanlon, DO  feeding supplement (ENSURE ENLIVE / ENSURE PLUS) LIQD Take 237 mLs by mouth 2 (two) times daily between meals. 01/10/23   Dameron, Nolberto Hanlon, DO  ferrous sulfate 325 (65 FE) MG tablet Take 1 tablet (325 mg total) by mouth every other day. 01/11/23   Dameron, Nolberto Hanlon, DO  gabapentin (NEURONTIN) 300 MG capsule Take 300-600  mg by mouth See admin instructions. Give 300 mg (1 capsule) in the mornings and 600 mg (2 capsules) at bedtime. 04/12/21   [provider]  guaiFENesin (MUCINEX) 600 MG 12 hr tablet Take 600 mg by mouth 2 (two) times daily.    [provider]  Lecithin 1200 MG CAPS Take 1,200 mg by mouth daily.    [provider]  Misc Natural Products (SAW PALMETTO) CAPS Take 600 mg by mouth daily.    [provider]  montelukast (SINGULAIR) 10 MG tablet Take 10 mg by mouth daily.    [provider]  Multiple Vitamin (DAILY VITE) TABS Take 1 tablet by mouth daily.    [provider]  Multiple Vitamins-Minerals (PRESERVISION AREDS 2) CAPS Take 1 capsule by mouth 2 (two) times daily.    [provider]  pantoprazole (PROTONIX) 40 MG tablet TAKE 1 TABLET BY MOUTH DAILY. --DX: GERD 06/07/22   O'Neal, Ronnald Ramp, MD  Probiotic Product [facility]) CAPS Take 1 capsule by mouth daily.    [provider]  rivastigmine (EXELON) 3 MG capsule Take 3 mg by mouth 2 (two) times daily. 04/11/20   [provider]  tamsulosin (FLOMAX) 0.4 MG CAPS capsule Take 1 capsule (0.4 mg total) by mouth daily. 01/10/23   Dameron, Nolberto Hanlon, DO  traMADol (ULTRAM) 50 MG tablet Take 25 mg by mouth 2 (two) times daily as needed (pain, headache).    [provider]  Allergies    Patient has no active allergies.    Review of Systems   Review of Systems  Physical Exam Updated Vital Signs BP (!) 127/47 (BP Location: Right Arm)   Pulse 70   Temp (!) 97.5 F (36.4 C) (Oral)   Resp 17   SpO2 96%  Physical Exam Vitals and nursing note reviewed.  Constitutional:      General: He is not in acute distress.    Appearance: He is well-developed.  HENT:     Head: Normocephalic and atraumatic.     Right Ear: External ear normal.     Left Ear: External ear normal.     Nose: Nose normal.  Eyes:     Extraocular Movements: Extraocular movements  intact.     Conjunctiva/sclera: Conjunctivae normal.     Pupils: Pupils are equal, round, and reactive to light.  Neck:     Comments: No C-spine midline tenderness to palpation Cardiovascular:     Rate and Rhythm: Normal rate. Rhythm irregular.     Heart sounds: Normal heart sounds.  Pulmonary:     Effort: Pulmonary effort is normal. No respiratory distress.     Breath sounds: Normal breath sounds.  Abdominal:     General: There is no distension.     Palpations: Abdomen is soft. There is no mass.     Tenderness: There is no abdominal tenderness. There is no guarding.  Musculoskeletal:     Cervical back: Normal range of motion and neck supple.     Right lower leg: No edema.     Left lower leg: No edema.     Comments: No T-spine or L-spine tenderness to palpation.  No chest wall tenderness palpation.  No knee or ankle tenderness to palpation.  No shoulder, elbow, or wrist tenderness to palpation including snuffbox  Skin:    General: Skin is warm and dry.  Neurological:     Mental Status: He is alert. Mental status is at baseline.     Cranial Nerves: No cranial nerve deficit.     Sensory: No sensory deficit.     Motor: No weakness.  Psychiatric:        Mood and Affect: Mood normal.        Behavior: Behavior normal.     ED Results / Procedures / Treatments   Labs (all labs ordered are listed, but only abnormal results are displayed) Labs Reviewed - No data to display  EKG None  Radiology No results found.  Procedures Procedures  {Document cardiac monitor, telemetry assessment procedure when appropriate:1}  Medications Ordered in ED Medications - No data to display  ED Course/ Medical Decision Making/ A&P   {   Click here for ABCD2, HEART and other calculatorsREFRESH Note before signing :1}                              Medical Decision Making Amount and/or Complexity of Data Reviewed Labs: ordered. Radiology: ordered.   ***  {Document critical care time when  appropriate:1} {Document review of labs and clinical decision tools ie heart score, Chads2Vasc2 etc:1}  {Document your independent review of radiology images, and any outside records:1} {Document your discussion with family members, caretakers, and with consultants:1} {Document social determinants of health affecting pt's care:1} {Document your decision making why or why not admission, treatments were needed:1} Final Clinical Impression(s) / ED Diagnoses Final diagnoses:  None    Rx / DC  Orders ED Discharge Orders     None

## 2023-02-11 NOTE — Discharge Instructions (Addendum)
You were seen for seen for your behavioral changes in the emergency department.  You are found to have urinary retention and had a Foley catheter placed.  You had reduced kidney function and were given IV fluids as well.  At home, please stay well-hydrated.  Leave the catheter in place for the next 4 to 5 days and then talk to your primary doctor about when he can be removed.  Please follow-up with them to have lab work drawn at that time as well to make sure that your kidney function is improving.    Please take the tramadol as needed and not as a scheduled medication  Check your MyChart online for the results of any tests that had not resulted by the time you left the emergency department.   Follow-up with your primary doctor in 4-5 days regarding your visit.    Return immediately to the emergency department if you experience any of the following: Bladder pain, fevers, or any other concerning symptoms.    Thank you for visiting our Emergency Department. It was a pleasure taking care of you today.

## 2023-02-11 NOTE — ED Notes (Signed)
CBG was 93. 

## 2023-03-20 ENCOUNTER — Telehealth: Payer: Self-pay

## 2023-03-20 ENCOUNTER — Encounter: Payer: Self-pay | Admitting: Cardiovascular Disease

## 2023-03-20 NOTE — Progress Notes (Signed)
 Cardiology Office Note    Date:  03/22/2023  ID:  Alandis Bluemel, DOB [birth year], MRN [id number] PCP:  [facility]  Cardiologist:  Darryle ONEIDA Decent, MD  Electrophysiologist:  None   Chief Complaint: Shortness of breath   History of Present Illness: .    Tallon Gertz is a 89 y.o. male with visit-pertinent history of persistent atrial fibrillation not on anticoagulation due to history of intracranial hemorrhage while on Coumadin, obstructive sleep apnea on CPAP, GERD and severe MR s/p TEER on 06/18/20.   Right and left heart catheterization on 06/03/2020 indicated mild disease in the mid LAD, the distal LAD had a severe lesion just before the vessel wraps around the apex of the heart, felt to only have a small amount of myocardium at risk and was recommended for medication management.  There was no obstructive disease in the dominant circumflex and RCA.  On 06/18/2020 he presented for MitraClip and underwent successful transcatheter edge-to-edge mitral valve repair using a single MitraClip.   In 01/2022 he had a fall at home, he had subdural scalp soft tissue defect with trace hematoma formation and no other acute changes.  He was last seen in clinic on 11/04/2022 by Dr. Decent.  He had remained stable from a cardiac standpoint.  On chart review patient was hospitalized in 12/2022 following a fall at home.  He was found to have severe iron deficiency anemia, requiring a unit of RBCs on admission.  Hemoglobin 6.9 initially with a ferritin of 14.  Patient for not to have colonoscopy or invasive procedures to characterize source of the bleed.  CT abdomen/pelvis angio negative for acute bleed.  His hemoglobin was stable to 9.4 on discharge.  He was also found to have an AKI with creatinine of 2, felt to be prerenal in nature due to dehydration.  After a fluid bolus his creatinine proved 1.5.  Echo on 01/09/2023 indicated LVEF of 60 to 65%, no RWMA, moderate concentric LVH, diastolic function  could not be evaluated, RV systolic function was low normal, moderately elevated pulmonary artery systolic pressures. His Lasix  was discontinued.  Today he presents for increased shortness of breath.  He reports that 2 days ago he saw his PCP for UTI, has been started on broad-spectrum antibiotics.  His PCP noted increased work of breathing which has also been noted by family.  Patient reports that he has been more short of breath and has started noticing some increased lower extremity edema.  He denies chest pain, palpitations, orthopnea or PND.  They are unsure if he has had any significant weight gain. Per family report his creatinine was 1.76 on 03/16/2023. His facility has been trying to increase his fluid intake given problems with dehydration.  His furosemide  was discontinued in October 2024.  ROS: .   Today he denies chest pain, fatigue, palpitations, melena, hematuria, hemoptysis, diaphoresis, weakness, presyncope, syncope, orthopnea, and PND.  All other systems are reviewed and otherwise negative. Studies Reviewed: SABRA    EKG:  EKG is not ordered today.   CV Studies:  Cardiac Studies & Procedures   CARDIAC CATHETERIZATION  CARDIAC CATHETERIZATION 06/18/2020  Narrative Successful transcatheter edge-to-edge mitral valve repair using a single MitraClip G4 XTW device positioned on the medial aspect of A2/P2 with reduction in mitral regurgitation from 3+ at baseline to 2+ post procedure.  There is significant V wave reduction following MitraClip deployment.  Pulmonary vein flow was initially blunted and is forward flow post clip deployment.   CARDIAC  CATHETERIZATION  CARDIAC CATHETERIZATION 06/03/2020  Narrative  Dist LAD lesion is 80% stenosed.  Mid LAD-1 lesion is 30% stenosed.  Mid LAD-2 lesion is 30% stenosed.  1. Mild disease in the mid LAD. The distal LAD has a severe lesion just before the vessel wraps around the apex. 2. No obstructive disease in the dominant Circumflex and  RCA  Findings Coronary Findings Diagnostic  Dominance: Co-dominant  Left Anterior Descending Mid LAD-1 lesion is 30% stenosed. Mid LAD-2 lesion is 30% stenosed. Dist LAD lesion is 80% stenosed.  Left Circumflex Vessel is large.  Right Coronary Artery Vessel is moderate in size.  Intervention  No interventions have been documented.    ECHOCARDIOGRAM  ECHOCARDIOGRAM COMPLETE 01/09/2023  Narrative ECHOCARDIOGRAM REPORT    Patient Name:   EFOSA TREICHLER Date of Exam: 01/09/2023 Medical Rec #:  [id number]      Height:       66.0 in Accession #:    [id number]     Weight:       150.0 lb Date of Birth:  [birth year]      BSA:          1.770 m Patient Age:    89 years       BP:           136/75 mmHg Patient Gender: M              HR:           83 bpm. Exam Location:  Inpatient  Procedure: 2D Echo, Cardiac Doppler and Color Doppler  Indications:    Syncope R55  History:        Patient has prior history of Echocardiogram examinations, most recent 02/11/2022. Risk Factors:Sleep Apnea and Non-Smoker. Mitra-Clip valve is present in the mitral position. Procedure Date: 06/18/2020.  Sonographer:    Tillman Nora RVT RCS Referring Phys: [id number] MARGARET E PRAY   Sonographer Comments: Image acquisition challenging due to respiratory motion. IMPRESSIONS   1. Left ventricular ejection fraction, by estimation, is 60 to 65%. The left ventricle has normal function. The left ventricle has no regional wall motion abnormalities. There is moderate concentric left ventricular hypertrophy. Left ventricular diastolic function could not be evaluated. 2. Right ventricular systolic function is low normal. The right ventricular size is normal. There is moderately elevated pulmonary artery systolic pressure. The estimated right ventricular systolic pressure is 46.9 mmHg. 3. The mitral valve is s/p TEER. Mild 2+ eccentric mitral valve regurgitation. No evidence of mitral stenosis. 4. Tricuspid valve  regurgitation is moderate. 5. The aortic valve is calcified. There is moderate calcification of the aortic valve. Aortic valve regurgitation is mild. No aortic stenosis is present. 6. The inferior vena cava is dilated in size with >50% respiratory variability, suggesting right atrial pressure of 8 mmHg. 7. Left atrial size was moderately dilated. 8. Right atrial size was mildly dilated.  FINDINGS Left Ventricle: Left ventricular ejection fraction, by estimation, is 60 to 65%. The left ventricle has normal function. The left ventricle has no regional wall motion abnormalities. The left ventricular internal cavity size was normal in size. There is moderate concentric left ventricular hypertrophy. Left ventricular diastolic function could not be evaluated due to atrial fibrillation. Left ventricular diastolic function could not be evaluated.  Right Ventricle: The right ventricular size is normal. No increase in right ventricular wall thickness. Right ventricular systolic function is low normal. There is moderately elevated pulmonary artery systolic pressure. The tricuspid regurgitant velocity is 3.12 m/s, and with  an assumed right atrial pressure of 8 mmHg, the estimated right ventricular systolic pressure is 46.9 mmHg.  Left Atrium: Left atrial size was moderately dilated.  Right Atrium: Right atrial size was mildly dilated.  Pericardium: There is no evidence of pericardial effusion.  Mitral Valve: The mitral valve has been repaired/replaced. Mild mitral valve regurgitation. No evidence of mitral valve stenosis.  Tricuspid Valve: The tricuspid valve is normal in structure. Tricuspid valve regurgitation is moderate . No evidence of tricuspid stenosis.  Aortic Valve: The aortic valve is calcified. There is moderate calcification of the aortic valve. Aortic valve regurgitation is mild. No aortic stenosis is present. Aortic valve mean gradient measures 4.3 mmHg. Aortic valve peak gradient measures  7.9 mmHg. Aortic valve area, by VTI measures 2.21 cm.  Pulmonic Valve: The pulmonic valve was grossly normal. Pulmonic valve regurgitation is trivial. No evidence of pulmonic stenosis.  Aorta: The aortic root is normal in size and structure.  Venous: The inferior vena cava is dilated in size with greater than 50% respiratory variability, suggesting right atrial pressure of 8 mmHg.  IAS/Shunts: No atrial level shunt detected by color flow Doppler.   LEFT VENTRICLE PLAX 2D LVIDd:         3.50 cm LVIDs:         2.10 cm LV PW:         1.20 cm LV IVS:        1.40 cm LVOT diam:     2.20 cm LV SV:         55 LV SV Index:   31 LVOT Area:     3.80 cm   RIGHT VENTRICLE             IVC RV S prime:     11.30 cm/s  IVC diam: 2.40 cm TAPSE (M-mode): 1.2 cm  LEFT ATRIUM            Index        RIGHT ATRIUM           Index LA diam:      3.80 cm  2.15 cm/m   RA Area:     17.10 cm LA Vol (A2C): 82.0 ml  46.34 ml/m  RA Volume:   45.50 ml  25.71 ml/m LA Vol (A4C): 100.0 ml 56.51 ml/m AORTIC VALVE                    PULMONIC VALVE AV Area (Vmax):    1.96 cm     PV Vmax:          0.98 m/s AV Area (Vmean):   1.90 cm     PV Peak grad:     3.8 mmHg AV Area (VTI):     2.21 cm     PR End Diast Vel: 10.76 msec AV Vmax:           140.23 cm/s AV Vmean:          93.367 cm/s AV VTI:            0.247 m AV Peak Grad:      7.9 mmHg AV Mean Grad:      4.3 mmHg LVOT Vmax:         72.25 cm/s LVOT Vmean:        46.600 cm/s LVOT VTI:          0.144 m LVOT/AV VTI ratio: 0.58 AR Vena Contracta: 0.40 cm  AORTA Ao Root diam: 3.70 cm Ao  Asc diam:  3.50 cm  MITRAL VALVE                TRICUSPID VALVE MV Area (PHT): 4.37 cm     TV Peak grad:   38.9 mmHg MV Decel Time: 174 msec     TV Vmax:        3.12 m/s MV E velocity: 169.50 cm/s  TR Peak grad:   38.9 mmHg MV A velocity: 61.55 cm/s   TR Vmax:        312.00 cm/s MV E/A ratio:  2.75 SHUNTS Systemic VTI:  0.14 m Systemic Diam: 2.20 cm  Aditya  Sabharwal Electronically signed by Ria Commander Signature Date/Time: 01/09/2023/1:18:27 PM    Final  TEE  ECHO TEE 06/18/2020  Narrative TRANSESOPHOGEAL ECHO REPORT    Patient Name:   ENDY EASTERLY Date of Exam: 06/18/2020 Medical Rec #:  [id number]      Height:       67.0 in Accession #:    [id number]     Weight:       154.1 lb Date of Birth:  04-12-1927      BSA:          1.810 m Patient Age:    89 years       BP:           135/76 mmHg Patient Gender: M              HR:           86 bpm. Exam Location:  Inpatient  Procedure: Transesophageal Echo, Color Doppler, Cardiac Doppler and 3D Echo  Indications:    Severe mitral regurgitation [[id number]]  History:        Patient has prior history of Echocardiogram examinations, most recent 05/22/2020. Arrythmias:Atrial Flutter; Risk Factors:Sleep Apnea. GERD.  Mitral Valve: NTW Mitral Clip valve is present in the mitral position. Procedure Date: 06/18/2020.  Sonographer:    Annabella Fell RDCS Referring Phys: [phone] MICHAEL COOPER  PROCEDURE: After discussion of the risks and benefits of a TEE, an informed consent was obtained from the patient. The patient was intubated. The transesophogeal probe was passed without difficulty through the esophogus of the patient. Imaged were obtained with the patient in a supine position. Sedation performed by different physician. The patient was monitored while under deep sedation. Anesthestetic sedation was provided intravenously by Anesthesiology: 90mg  of Propofol . The patient developed no complications during the procedure.   PRE-PROCEDURE EXAM  Normal left ventricular systolic function, LVEF 70%. 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 Moderate to severe mitral insufficiency, with the largest jet located at the medial side of P2 (towards P3) with an eccentric, medially directed jet. Myxomatous tricuspid  valve with mild-moderate regurgitation. No left atrial thrombus. No pericardial effusion.   POST-PROCEDURE EXAM  Noral left ventricular systolic function, LVEF 65%. Well-deployed XTW MitraClip with stable position and good tissue bridge. There is eccentric, mild-to-moderate residual mitral insufficiency medial to the clip. There is trivial-to-mild residual mitral insufficiency lateral to the clip. Mean mitral valve diastolic gradient is 2 mm Hg. PHT 107 ms. By summation of the larger lateral and smaller medial mitral neo-orifices the mitral valve area is approximately 1.9 cm sq by planimetry. There is a small (2.5 mm) iatrogenic atrial septal defect in the mid-fossa ovale with exclusively left-to-right shunt. No pericardial effusion.  IMPRESSIONS   1. Left ventricular ejection fraction, by estimation, is 65  to 70%. The left ventricle has normal function. The left ventricle has no regional wall motion abnormalities. Left ventricular diastolic function could not be evaluated. 2. Right ventricular systolic function is normal. The right ventricular size is normal. There is normal pulmonary artery systolic pressure. 3. Left atrial size was mildly dilated. No left atrial/left atrial appendage thrombus was detected. 4. The mitral valve is myxomatous. Moderate to severe mitral valve regurgitation. No evidence of mitral stenosis. There is severe holosystolic prolapse of the middle scallop of the posterior leaflet of the mitral valve. The mean mitral valve gradient is 1.6 mmHg. There is a NTW Mitral Clip present in the mitral position. Procedure Date: 06/18/2020. 5. The tricuspid valve is myxomatous. Tricuspid valve regurgitation is mild to moderate. 6. The aortic valve is tricuspid. Aortic valve regurgitation is mild. Mild to moderate aortic valve sclerosis/calcification is present, without any evidence of aortic stenosis. 7. There is Moderate (Grade III) layered plaque involving the descending  aorta.  FINDINGS Left Ventricle: Left ventricular ejection fraction, by estimation, is 65 to 70%. The left ventricle has normal function. The left ventricle has no regional wall motion abnormalities. The left ventricular internal cavity size was normal in size. There is no left ventricular hypertrophy. Left ventricular diastolic function could not be evaluated due to atrial fibrillation. Left ventricular diastolic function could not be evaluated.  Right Ventricle: The right ventricular size is normal. No increase in right ventricular wall thickness. Right ventricular systolic function is normal. There is normal pulmonary artery systolic pressure. The tricuspid regurgitant velocity is 1.95 m/s, and with an assumed right atrial pressure of 5 mmHg, the estimated right ventricular systolic pressure is 20.2 mmHg.  Left Atrium: Left atrial size was mildly dilated. No left atrial/left atrial appendage thrombus was detected.  Right Atrium: Right atrial size was normal in size.  Pericardium: There is no evidence of pericardial effusion.  Mitral Valve: The mitral valve is myxomatous. There is severe holosystolic prolapse of the middle scallop of the posterior leaflet of the mitral valve. There is moderate thickening of the anterior and posterior mitral valve leaflet(s). Moderate to severe mitral valve regurgitation. There is a NTW Mitral Clip present in the mitral position. Procedure Date: 06/18/2020. No evidence of mitral valve stenosis. The mean mitral valve gradient is 1.6 mmHg with average heart rate of 67 bpm.  Tricuspid Valve: The tricuspid valve is myxomatous. Tricuspid valve regurgitation is mild to moderate.  Aortic Valve: The aortic valve is tricuspid. Aortic valve regurgitation is mild. Aortic regurgitation PHT measures 497 msec. Mild to moderate aortic valve sclerosis/calcification is present, without any evidence of aortic stenosis.  Pulmonic Valve: The pulmonic valve was normal in structure.  Pulmonic valve regurgitation is trivial.  Aorta: The aortic root, ascending aorta and aortic arch are all structurally normal, with no evidence of dilitation or obstruction. There is moderate (Grade III) layered plaque involving the descending aorta.  IAS/Shunts: No atrial level shunt detected by color flow Doppler.   LEFT VENTRICLE PLAX 2D LVOT diam:     2.40 cm LVOT Area:     4.52 cm   AORTIC VALVE AI PHT:      497 msec  MITRAL VALVE            TRICUSPID VALVE MV Area (PHT): 2.10 cm TR Peak grad:   15.2 mmHg MV Mean grad:  1.6 mmHg TR Vmax:        195.00 cm/s MV Decel Time: 361 msec SHUNTS Systemic Diam: 2.40 cm  Mihai  Croitoru MD Electronically signed by Jerel Balding MD Signature Date/Time: 06/18/2020/1:08:12 PM    Final             Current Reported Medications:.    Current Meds  Medication Sig   acetaminophen  (TYLENOL ) 500 MG tablet Take 500 mg by mouth in the morning, at noon, and at bedtime.   Calcium Carb-Cholecalciferol (CALCIUM 500 +D) 500-10 MG-MCG TABS Take 1 tablet by mouth daily.   cephALEXin (KEFLEX) 500 MG capsule Take 500 mg by mouth 3 (three) times daily.   cholecalciferol (VITAMIN D3) 25 MCG (1000 UNIT) tablet Take 1,000 Units by mouth daily.   diclofenac  Sodium (VOLTAREN ) 1 % GEL Apply 2 g topically 4 (four) times daily.   feeding supplement (ENSURE ENLIVE / ENSURE PLUS) LIQD Take 237 mLs by mouth 2 (two) times daily between meals.   gabapentin  (NEURONTIN ) 300 MG capsule Take 300-600 mg by mouth See admin instructions. Give 300 mg (1 capsule) in the mornings and 600 mg (2 capsules) at bedtime.   guaiFENesin  (MUCINEX ) 600 MG 12 hr tablet Take 600 mg by mouth 2 (two) times daily.   Lecithin 1200 MG CAPS Take 1,200 mg by mouth daily.   Misc Natural Products (SAW PALMETTO) CAPS Take 600 mg by mouth daily.   montelukast  (SINGULAIR ) 10 MG tablet Take 10 mg by mouth daily.   Multiple Vitamin (DAILY VITE) TABS Take 1 tablet by mouth daily.   Multiple  Vitamins-Minerals (PRESERVISION AREDS 2) CAPS Take 1 capsule by mouth 2 (two) times daily.   pantoprazole  (PROTONIX ) 40 MG tablet TAKE 1 TABLET BY MOUTH DAILY. --DX: GERD   Probiotic Product (PHILLIPS COLON HEALTH) CAPS Take 1 capsule by mouth daily.   rivastigmine  (EXELON ) 3 MG capsule Take 3 mg by mouth 2 (two) times daily.   tamsulosin  (FLOMAX ) 0.4 MG CAPS capsule Take 1 capsule (0.4 mg total) by mouth daily.   [DISCONTINUED] furosemide  (LASIX ) 20 MG tablet Take by mouth daily.   Physical Exam:    VS:  BP 124/78   Pulse 85   Ht 5' 6 (1.676 m)   Wt 155 lb (70.3 kg)   SpO2 95%   BMI 25.02 kg/m    Wt Readings from Last 3 Encounters:  03/22/23 155 lb (70.3 kg)  11/04/22 150 lb (68 kg)  06/10/22 154 lb 1.6 oz (69.9 kg)    GEN: Well nourished, well developed in no acute distress NECK: No JVD; No carotid bruits CARDIAC: Irregularly irregular RR, no murmurs, rubs, gallops RESPIRATORY:  Crackles in bilateral bases  ABDOMEN: Soft, non-tender, non-distended EXTREMITIES:  Mild nonpitting edema to shins; No acute deformity   Asessement and Plan:.    Chronic diastolic HF/shortness of breath/lower extremity edema: Echo on 01/09/2023 indicated LVEF of 60 to 65%, no RWMA, moderate concentric LVH, diastolic function could not be evaluated, RV systolic function was low normal, moderately elevated pulmonary artery systolic pressures.Today he reports increased shortness of breath and lower extremity edema. Denies chest pain, orthopnea or pnd. On exam he has mild lower extremity edema that is nonpitting, he has some crackles in bilateral lung bases. given discontinuation of Lasix  and increased fluid intake at his facility he is likely fluid overloaded.  Will check BNP and BMET. Will also check CBC given hx of anemia. Start Lasix  20 mg daily for 3 days then every other day thereafter.  Repeat BMET in two weeks.   Severe mitral insufficiency: s/p mitral valve clip in 06/2020.  Stable on echo on  01/09/2023.  Hypertension: Initial blood pressure today 148/80, on recheck was 124/78.  Persistent atrial fibrillation: Heart rate well-controlled at 85 bpm today.  He denies feelings of increased heart rate or palpitations. Not on anticoagulation due to prior history of intracerebral hemorrhage.    Disposition: F/u with Nycole Kawahara, NP in four weeks or sooner if needed.  Signed, Jagger Beahm D Joee Iovine, NP

## 2023-03-20 NOTE — Telephone Encounter (Signed)
 Called and spoke with son.  He states patient seen his FNP this morning as was diagnosed with a new UTI. He states FNP also remarked about his increased cardiac effort with ambulation. Son/family has also noticed recent increase of shortness of breath with exertion. Appt made with ARNP .

## 2023-03-22 ENCOUNTER — Encounter: Payer: Self-pay | Admitting: Cardiology

## 2023-03-22 ENCOUNTER — Ambulatory Visit: Payer: Medicare HMO | Attending: Cardiology | Admitting: Cardiology

## 2023-03-22 VITALS — BP 124/78 | HR 85 | Ht 66.0 in | Wt 155.0 lb

## 2023-03-22 DIAGNOSIS — R0602 Shortness of breath: Secondary | ICD-10-CM

## 2023-03-22 DIAGNOSIS — Z9889 Other specified postprocedural states: Secondary | ICD-10-CM | POA: Diagnosis not present

## 2023-03-22 DIAGNOSIS — I5032 Chronic diastolic (congestive) heart failure: Secondary | ICD-10-CM | POA: Diagnosis not present

## 2023-03-22 DIAGNOSIS — I4819 Other persistent atrial fibrillation: Secondary | ICD-10-CM

## 2023-03-22 DIAGNOSIS — D509 Iron deficiency anemia, unspecified: Secondary | ICD-10-CM

## 2023-03-22 DIAGNOSIS — I340 Nonrheumatic mitral (valve) insufficiency: Secondary | ICD-10-CM

## 2023-03-22 DIAGNOSIS — R600 Localized edema: Secondary | ICD-10-CM

## 2023-03-22 DIAGNOSIS — Z95818 Presence of other cardiac implants and grafts: Secondary | ICD-10-CM

## 2023-03-22 DIAGNOSIS — I10 Essential (primary) hypertension: Secondary | ICD-10-CM | POA: Diagnosis not present

## 2023-03-22 MED ORDER — FUROSEMIDE 20 MG PO TABS: ORAL_TABLET | ORAL | 3 refills | Status: AC

## 2023-03-22 NOTE — Patient Instructions (Signed)
 Medication Instructions:  Start Furosamide 20 mg once a day for the next 3 days after that take 20 mgs will be every other day *If you need a refill on your cardiac medications before your next appointment, please call your pharmacy*  Lab Work: Today we are going to draw CBC, BNP, and Bmet We will need to get Bmet in 2 weeks If you have labs (blood work) drawn today and your tests are completely normal, you will receive your results only by: MyChart Message (if you have MyChart) OR A paper copy in the mail If you have any lab test that is abnormal or we need to change your treatment, we will call you to review the results.  Testing/Procedures: No testing  Follow-Up: At [facility], you and your health needs are our priority.  As part of our continuing mission to provide you with exceptional heart care, we have created designated Provider Care Teams.  These Care Teams include your primary Cardiologist (physician) and Advanced Practice Providers (APPs -  Physician Assistants and Nurse Practitioners) who all work together to provide you with the care you need, when you need it.  We recommend signing up for the patient portal called MyChart.  Sign up information is provided on this After Visit Summary.  MyChart is used to connect with patients for Virtual Visits (Telemedicine).  Patients are able to view lab/test results, encounter notes, upcoming appointments, etc.  Non-urgent messages can be sent to your provider as well.   To learn more about what you can do with MyChart, go to [email or web address].    Your next appointment:   4-6 weeks  Provider:   Katlyn West, NP

## 2023-03-23 ENCOUNTER — Telehealth: Payer: Self-pay | Admitting: Physician Assistant

## 2023-03-23 ENCOUNTER — Telehealth: Payer: Self-pay | Admitting: Cardiovascular Disease

## 2023-03-23 ENCOUNTER — Emergency Department (HOSPITAL_BASED_OUTPATIENT_CLINIC_OR_DEPARTMENT_OTHER)
Admission: EM | Admit: 2023-03-23 | Discharge: 2023-03-23 | Disposition: A | Discharge status: Home / Self Care | Payer: Medicare HMO | Attending: Emergency Medicine | Admitting: Emergency Medicine

## 2023-03-23 ENCOUNTER — Other Ambulatory Visit: Payer: Self-pay

## 2023-03-23 DIAGNOSIS — R319 Hematuria, unspecified: Secondary | ICD-10-CM | POA: Diagnosis not present

## 2023-03-23 DIAGNOSIS — I509 Heart failure, unspecified: Secondary | ICD-10-CM | POA: Diagnosis not present

## 2023-03-23 DIAGNOSIS — R7989 Other specified abnormal findings of blood chemistry: Secondary | ICD-10-CM | POA: Insufficient documentation

## 2023-03-23 DIAGNOSIS — Z79899 Other long term (current) drug therapy: Secondary | ICD-10-CM | POA: Diagnosis not present

## 2023-03-23 DIAGNOSIS — R899 Unspecified abnormal finding in specimens from other organs, systems and tissues: Secondary | ICD-10-CM

## 2023-03-23 LAB — BASIC METABOLIC PANEL
Anion gap: 9 (ref 5–15)
BUN/Creatinine Ratio: 33 — ABNORMAL HIGH (ref 10–24)
BUN: 53 mg/dL — ABNORMAL HIGH (ref 10–36)
BUN: 57 mg/dL — ABNORMAL HIGH (ref 8–23)
CO2: 17 mmol/L — ABNORMAL LOW (ref 20–29)
CO2: 17 mmol/L — ABNORMAL LOW (ref 22–32)
Calcium: 8.6 mg/dL (ref 8.6–10.2)
Calcium: 8.4 mg/dL — ABNORMAL LOW (ref 8.9–10.3)
Chloride: 101 mmol/L (ref 96–106)
Chloride: 106 mmol/L (ref 98–111)
Creatinine, Ser: 1.61 mg/dL — ABNORMAL HIGH (ref 0.76–1.27)
Creatinine, Ser: 1.63 mg/dL — ABNORMAL HIGH (ref 0.61–1.24)
GFR, Estimated: 39 mL/min — ABNORMAL LOW (ref >60)
Glucose, Bld: 78 mg/dL (ref 70–99)
Glucose: 104 mg/dL — ABNORMAL HIGH (ref 70–99)
Potassium: 6.3 mmol/L (ref 3.5–5.2)
Potassium: 4.6 mmol/L (ref 3.5–5.1)
Sodium: 131 mmol/L — ABNORMAL LOW (ref 134–144)
Sodium: 132 mmol/L — ABNORMAL LOW (ref 135–145)
eGFR: 40 mL/min/{1.73_m2} — ABNORMAL LOW (ref >59)

## 2023-03-23 LAB — CBC
Hematocrit: 30.7 % — ABNORMAL LOW (ref 37.5–51.0)
Hemoglobin: 9.3 g/dL — ABNORMAL LOW (ref 13.0–17.7)
MCH: 27.7 pg (ref 26.6–33.0)
MCHC: 30.3 g/dL — ABNORMAL LOW (ref 31.5–35.7)
MCV: 91 fL (ref 79–97)
Platelets: 222 10*3/uL (ref 150–450)
RBC: 3.36 x10E6/uL — ABNORMAL LOW (ref 4.14–5.80)
RDW: 17 % — ABNORMAL HIGH (ref 11.6–15.4)
WBC: 6.6 10*3/uL (ref 3.4–10.8)

## 2023-03-23 LAB — BRAIN NATRIURETIC PEPTIDE: BNP: 369.3 pg/mL — ABNORMAL HIGH (ref 0.0–100.0)

## 2023-03-23 NOTE — Telephone Encounter (Signed)
 I received sign out from overnight fellow that K was 6.3. He asked [facility] to re run lab but we have not gotten the result yet. Given prior K 4.9, CKD, and recent antibiotic initiation for UTI (son unsure if Cipro, Cephalexin?), advanced age, and plan for med change with Lasix , this will be difficult to manage outpatient if truly elevated. I called and spoke with son Charlie on [place] and relayed recommendation to proceed to ED to have this rechecked. They prefer [facility]. They will confirm med list as well and bring to that encounter. Do not see any potassium supplements or common offenders to raise this value.  I will route this msg to Katlyn West as FYI in case this impacts plan going forward.  Son appreciative for call.

## 2023-03-23 NOTE — Telephone Encounter (Signed)
 [facility] is calling needing to report to clinical they were unable to resubmit labs.   Call transferred to triage.

## 2023-03-23 NOTE — ED Provider Notes (Signed)
 [place] EMERGENCY DEPARTMENT AT [facility] Provider Note   CSN: [id number] Arrival date & time: 03/23/23  9141     History  Chief Complaint  Patient presents with   Abnormal Lab    Adrian [name] is a 89 y.o. male.  Patient is a 89 year old male with a past medical history of A-fib not on AC due to prior ICH, CHF presenting to the emergency department with abnormal labs.  The patient was seen by his cardiologist yesterday and had routine labs ordered.  Family was called this morning with critical potassium result of 6.4.  They state that the patient was recently started on Keflex for a UTI 2 days ago.  Was having hematuria at that time that the patient reports has improved and he is otherwise been urinating normally.  Was also having some swelling in his feet and shortness of breath and was restarted on the Lasix  yesterday but did not take his first dose until after the labs were drawn.  He denies any chest pain or dizziness.  The history is provided by the patient and a relative.  Abnormal Lab      Home Medications Prior to Admission medications   Medication Sig Start Date End Date Taking? Authorizing Provider  acetaminophen  (TYLENOL ) 500 MG tablet Take 500 mg by mouth in the morning, at noon, and at bedtime.    [provider]  Calcium Carb-Cholecalciferol (CALCIUM 500 +D) 500-10 MG-MCG TABS Take 1 tablet by mouth daily.    [provider]  cephALEXin (KEFLEX) 500 MG capsule Take 500 mg by mouth 3 (three) times daily. 03/20/23   [provider]  cholecalciferol (VITAMIN D3) 25 MCG (1000 UNIT) tablet Take 1,000 Units by mouth daily.    [provider]  diclofenac  Sodium (VOLTAREN ) 1 % GEL Apply 2 g topically 4 (four) times daily. 01/10/23   Dameron, Marisa, DO  feeding supplement (ENSURE ENLIVE / ENSURE PLUS) LIQD Take 237 mLs by mouth 2 (two) times daily between meals. 01/10/23   Dameron, Marisa, DO  furosemide  (LASIX ) 20 MG tablet  Take 20 mg once a day for the next three day's then decrease to 20 mg every other day. 03/22/23   West, Katlyn D, NP  gabapentin  (NEURONTIN ) 300 MG capsule Take 300-600 mg by mouth See admin instructions. Give 300 mg (1 capsule) in the mornings and 600 mg (2 capsules) at bedtime. 04/12/21   [provider]  guaiFENesin  (MUCINEX ) 600 MG 12 hr tablet Take 600 mg by mouth 2 (two) times daily.    [provider]  Lecithin 1200 MG CAPS Take 1,200 mg by mouth daily.    [provider]  Misc Natural Products (SAW PALMETTO) CAPS Take 600 mg by mouth daily.    [provider]  montelukast  (SINGULAIR ) 10 MG tablet Take 10 mg by mouth daily.    [provider]  Multiple Vitamin (DAILY VITE) TABS Take 1 tablet by mouth daily.    [provider]  Multiple Vitamins-Minerals (PRESERVISION AREDS 2) CAPS Take 1 capsule by mouth 2 (two) times daily.    [provider]  pantoprazole  (PROTONIX ) 40 MG tablet TAKE 1 TABLET BY MOUTH DAILY. --DX: GERD 06/07/22   O'Neal, Darryle Ned, MD  Probiotic Product [facility] COLON HEALTH) CAPS Take 1 capsule by mouth daily.    [provider]  rivastigmine  (EXELON ) 3 MG capsule Take 3 mg by mouth 2 (two) times daily. 04/11/20   [provider]  tamsulosin  (FLOMAX ) 0.4  MG CAPS capsule Take 1 capsule (0.4 mg total) by mouth daily. 01/10/23   Dameron, Marisa, DO      Allergies    Patient has no known allergies.    Review of Systems   Review of Systems  Physical Exam Updated Vital Signs BP 130/84 (BP Location: Left Arm)   Pulse 85   Temp 98.6 F (37 C) (Temporal)   Resp 18   SpO2 95%  Physical Exam Vitals and nursing note reviewed.  Constitutional:      General: He is not in acute distress.    Appearance: Normal appearance.  HENT:     Head: Normocephalic and atraumatic.     Nose: Nose normal.     Mouth/Throat:     Mouth: Mucous membranes are moist.     Pharynx: Oropharynx is clear.  Eyes:      Extraocular Movements: Extraocular movements intact.     Conjunctiva/sclera: Conjunctivae normal.  Cardiovascular:     Rate and Rhythm: Normal rate. Rhythm irregular.     Heart sounds: Normal heart sounds.  Pulmonary:     Effort: Pulmonary effort is normal.     Breath sounds: Normal breath sounds.  Abdominal:     General: Abdomen is flat.     Palpations: Abdomen is soft.     Tenderness: There is no abdominal tenderness.  Musculoskeletal:        General: Normal range of motion.     Cervical back: Normal range of motion.  Skin:    General: Skin is warm and dry.  Neurological:     General: No focal deficit present.     Mental Status: He is alert and oriented to person, place, and time.  Psychiatric:        Mood and Affect: Mood normal.        Behavior: Behavior normal.     ED Results / Procedures / Treatments   Labs (all labs ordered are listed, but only abnormal results are displayed) Labs Reviewed  BASIC METABOLIC PANEL - Abnormal; Notable for the following components:      Result Value   Sodium 132 (*)    CO2 17 (*)    BUN 57 (*)    Creatinine, Ser 1.63 (*)    Calcium 8.4 (*)    GFR, Estimated 39 (*)    All other components within normal limits    EKG EKG Interpretation Date/Time:  Thursday March 23 2023 09:39:23 EST Ventricular Rate:  82 PR Interval:  131 QRS Duration:  101 QT Interval:  438 QTC Calculation: 506 R Axis:   1  Text Interpretation: Atrial flutter Multiform ventricular premature complexes Sinus pause Low voltage, extremity leads Prolonged QT interval PVCs otherwise unchanged from prior EKG Confirmed by Ellouise Fine ([id number]) on 03/23/2023 10:06:48 AM  Radiology No results found.  Procedures Procedures    Medications Ordered in ED Medications - No data to display  ED Course/ Medical Decision Making/ A&P Clinical Course as of 03/23/23 1105  Thu Mar 23, 2023  1042 Potassium here 4.6, likely lab error yesterday. Patient is stable for  discharge home. Recommended to continue lasix  as prescribed and outpatient follow up. [VK]    Clinical Course User Index [VK] Kingsley, Anaclara Acklin K, DO                                 Medical Decision Making This patient presents to the ED with chief complaint(s)  of abnormal labs with pertinent past medical history of A-fib, CHF which further complicates the presenting complaint. The complaint involves an extensive differential diagnosis and also carries with it a high risk of complications and morbidity.    The differential diagnosis includes hemolysis/lab error, hyperkalemia, AKI, arrhythmia  Additional history obtained: Additional history obtained from family Records reviewed Primary Care Documents and outpatient cardiology records  ED Course and Reassessment: On patient's arrival he is hemodynamically stable in no acute distress.  EKG on arrival showed rate controlled atrial flutter without any hyperkalemic EKG changes.  The patient labs drawn yesterday showed a potassium of 6.3 with creatinine approximately at baseline.  Will have repeat labs drawn today to evaluate for any possible hemolysis or lab error.  Independent labs interpretation:  The following labs were independently interpreted: within normal range  Independent visualization of imaging: - N/A  Consultation: - Consulted or discussed management/test interpretation w/ external professional: N/A  Consideration for admission or further workup: Patient has no emergent conditions requiring admission or further work-up at this time and is stable for discharge home with primary care follow-up  Social Determinants of health: N/A    Amount and/or Complexity of Data Reviewed Labs: ordered.          Final Clinical Impression(s) / ED Diagnoses Final diagnoses:  Abnormal laboratory test    Rx / DC Orders ED Discharge Orders     None         Kingsley, Tiffinie Caillier K, DO 03/23/23 1105

## 2023-03-23 NOTE — Discharge Instructions (Signed)
 You were seen in the emergency department for your abnormal labs.  We repeated your labs today and your potassium level is normal.  There was likely a lab error that caused your potassium to look falsely elevated yesterday.  You can continue to complete your antibiotics as prescribed and take your Lasix  as prescribed.  You should follow-up with your primary doctor and cardiologist as scheduled.  You can return to the emergency department if you have any new or concerning symptoms.

## 2023-03-23 NOTE — ED Notes (Signed)
 Dc instructions reviewed with patient. Patient voiced understanding. Dc with belongings.

## 2023-03-23 NOTE — ED Triage Notes (Signed)
 Per son at bedside, pt seen by cardiologist yesterday, had labs drawn and received call this morning that pt's K+ was 6.5 and advised to report to ED for evaluation.  Pt denies any pain or symptoms at present. Pt currently taking Abx for UTI, since Jan 7.

## 2023-03-23 NOTE — Telephone Encounter (Signed)
 Received call from Kim at [facility]. She stated they were unable to do a repeat potassium level due to insufficient specimen quantity. I reviewed chart and patient had labs done at [facility] ED today with potassium level 4.6. I couldn't find an order for a repeat lab draw to check potassium levels.   Josie LPN

## 2023-03-24 ENCOUNTER — Telehealth: Payer: Self-pay

## 2023-03-24 NOTE — Telephone Encounter (Signed)
-----   Message from Katlyn D [place] sent at 03/24/2023  8:00 AM EST ----- Please call Mr. Flury, let him and his sons know that I was made aware of repeat labs at [facility] ED with normal potassium. He can continue with Lasix  as discussed and follow up as planned. His CBC shows baseline anemia. His BNP which indicates his fluid level is slightly elevated, continue with lasix  as discussed. He should repeat a BMET in two weeks.

## 2023-03-24 NOTE — Telephone Encounter (Signed)
 Called patient advised of below they verbalized understanding.

## 2023-04-07 LAB — BASIC METABOLIC PANEL
BUN/Creatinine Ratio: 27 — ABNORMAL HIGH (ref 10–24)
BUN: 46 mg/dL — ABNORMAL HIGH (ref 10–36)
CO2: 16 mmol/L — ABNORMAL LOW (ref 20–29)
Calcium: 8.3 mg/dL — ABNORMAL LOW (ref 8.6–10.2)
Chloride: 98 mmol/L (ref 96–106)
Creatinine, Ser: 1.7 mg/dL — ABNORMAL HIGH (ref 0.76–1.27)
Glucose: 79 mg/dL (ref 70–99)
Potassium: 5.4 mmol/L — ABNORMAL HIGH (ref 3.5–5.2)
Sodium: 131 mmol/L — ABNORMAL LOW (ref 134–144)
eGFR: 37 mL/min/{1.73_m2} — ABNORMAL LOW (ref >59)

## 2023-04-13 ENCOUNTER — Other Ambulatory Visit (HOSPITAL_BASED_OUTPATIENT_CLINIC_OR_DEPARTMENT_OTHER): Payer: Self-pay

## 2023-04-16 NOTE — Progress Notes (Unsigned)
Cardiology Office Note    Date:  04/17/2023  ID:  Adrian [name], DOB January 21, 1928, MRN [id number] PCP:  Adrian [name], [facility]  Cardiologist:  Adrian Harps, MD  Electrophysiologist:  None   Chief Complaint: Follow up for shortness of breath   History of Present Illness: .    Adrian [name] is a 89 y.o. male with visit-pertinent history of persistent atrial fibrillation not on anticoagulation due to history of intracranial hemorrhage while on Coumadin, obstructive sleep apnea on CPAP, GERD and severe MR s/p TEER on 06/18/20.   Right and left heart catheterization on 06/03/2020 indicated mild disease in the mid LAD, the distal LAD had a severe lesion just before the vessel wraps around the apex of the heart, felt to only have a small amount of myocardium at risk and was recommended for medication management.  There was no obstructive disease in the dominant circumflex and RCA.  On 06/18/2020 he presented for MitraClip and underwent successful transcatheter edge-to-edge mitral valve repair using a single MitraClip.   In 01/2022 he had a fall at home, he had subdural scalp soft tissue defect with trace hematoma formation and no other acute changes.  He was last seen in clinic on 11/04/2022 by Dr. Flora [name].  He had remained stable from a cardiac standpoint.  On chart review patient was hospitalized in 12/2022 following a fall at home.  He was found to have severe iron deficiency anemia, requiring a unit of RBCs on admission.  Hemoglobin 6.9 initially with a ferritin of 14.  Patient for not to have colonoscopy or invasive procedures to characterize source of the bleed.  CT abdomen/pelvis angio negative for acute bleed.  His hemoglobin was stable to 9.4 on discharge.  He was also found to have an AKI with creatinine of 2, felt to be prerenal in nature due to dehydration.  After a fluid bolus his creatinine proved 1.5.  Echo on 01/09/2023 indicated LVEF of 60 to 65%, no RWMA, moderate concentric LVH,  diastolic function could not be evaluated, RV systolic function was low normal, moderately elevated pulmonary artery systolic pressures. His Lasix was discontinued.  On 03/22/2023 he presented for increased shortness of breath.  He and his sons reported that 2 days prior he saw his PCP for a UTI and had been started on broad-spectrum antibiotics.  His PCP noted increased work of breathing.  Family also endorsed increased lower extremity edema.  His facility had been trying to increase his fluid intake given problems with dehydration.  His furosemide had previously been discontinued in October 2024.  Patient was started on Lasix 20 mg daily for 3 days then to start every other day thereafter.  Today he presents for follow-up.  He reports that he is doing well.  Denies chest pain, feels that his breathing was somewhat improved.  He notes that his biggest concern currently is diarrhea.  Sounds and starting on Keflex he has had problems with intermittent diarrhea, he plans to follow-up with his PCP regarding this.  His lower extremity edema has improved.  Labwork independently reviewed: 04/06/2023: Sodium 131, potassium 5.4, creatinine 1.7  ROS: .   Today he denies chest pain, fatigue, palpitations, melena, hematuria, hemoptysis, diaphoresis, weakness, presyncope, syncope, orthopnea, and PND.  All other systems are reviewed and otherwise negative.  Studies Reviewed: Marland Kitchen    EKG:  EKG is not ordered today.  CV Studies:  Cardiac Studies & Procedures   CARDIAC CATHETERIZATION  CARDIAC CATHETERIZATION 06/18/2020  Narrative Successful transcatheter edge-to-edge  mitral valve repair using a single MitraClip G4 XTW device positioned on the medial aspect of A2/P2 with reduction in mitral regurgitation from 3+ at baseline to 2+ post procedure.  There is significant V wave reduction following MitraClip deployment.  Pulmonary vein flow was initially blunted and is forward flow post clip deployment.   CARDIAC  CATHETERIZATION  CARDIAC CATHETERIZATION 06/03/2020  Narrative  Dist LAD lesion is 80% stenosed.  Mid LAD-1 lesion is 30% stenosed.  Mid LAD-2 lesion is 30% stenosed.  1. Mild disease in the mid LAD. The distal LAD has a severe lesion just before the vessel wraps around the apex. 2. No obstructive disease in the dominant Circumflex and RCA  Findings Coronary Findings Diagnostic  Dominance: Co-dominant  Left Anterior Descending Mid LAD-1 lesion is 30% stenosed. Mid LAD-2 lesion is 30% stenosed. Dist LAD lesion is 80% stenosed.  Left Circumflex Vessel is large.  Right Coronary Artery Vessel is moderate in size.  Intervention  No interventions have been documented.    ECHOCARDIOGRAM  ECHOCARDIOGRAM COMPLETE 01/09/2023  Narrative ECHOCARDIOGRAM REPORT    Patient Name:   Adrian [name] Date of Exam: 01/09/2023 Medical Rec #:  [id number]      Height:       66.0 in Accession #:    [id number]     Weight:       150.0 lb Date of Birth:  1927/09/03      BSA:          1.770 m Patient Age:    89 years       BP:           136/75 mmHg Patient Gender: M              HR:           83 bpm. Exam Location:  Inpatient  Procedure: 2D Echo, Cardiac Doppler and Color Doppler  Indications:    Syncope R55  History:        Patient has prior history of Echocardiogram examinations, most recent 02/11/2022. Risk Factors:Sleep Apnea and Non-Smoker. Mitra-Clip valve is present in the mitral position. Procedure Date: 06/18/2020.  Sonographer:    Dondra Prader RVT RCS Referring Phys: [id number] MARGARET E PRAY   Sonographer Comments: Image acquisition challenging due to respiratory motion. IMPRESSIONS   1. Left ventricular ejection fraction, by estimation, is 60 to 65%. The left ventricle has normal function. The left ventricle has no regional wall motion abnormalities. There is moderate concentric left ventricular hypertrophy. Left ventricular diastolic function could not be evaluated. 2.  Right ventricular systolic function is low normal. The right ventricular size is normal. There is moderately elevated pulmonary artery systolic pressure. The estimated right ventricular systolic pressure is 46.9 mmHg. 3. The mitral valve is s/p TEER. Mild 2+ eccentric mitral valve regurgitation. No evidence of mitral stenosis. 4. Tricuspid valve regurgitation is moderate. 5. The aortic valve is calcified. There is moderate calcification of the aortic valve. Aortic valve regurgitation is mild. No aortic stenosis is present. 6. The inferior vena cava is dilated in size with >50% respiratory variability, suggesting right atrial pressure of 8 mmHg. 7. Left atrial size was moderately dilated. 8. Right atrial size was mildly dilated.  FINDINGS Left Ventricle: Left ventricular ejection fraction, by estimation, is 60 to 65%. The left ventricle has normal function. The left ventricle has no regional wall motion abnormalities. The left ventricular internal cavity size was normal in size. There is moderate concentric left ventricular hypertrophy. Left ventricular diastolic  function could not be evaluated due to atrial fibrillation. Left ventricular diastolic function could not be evaluated.  Right Ventricle: The right ventricular size is normal. No increase in right ventricular wall thickness. Right ventricular systolic function is low normal. There is moderately elevated pulmonary artery systolic pressure. The tricuspid regurgitant velocity is 3.12 m/s, and with an assumed right atrial pressure of 8 mmHg, the estimated right ventricular systolic pressure is 46.9 mmHg.  Left Atrium: Left atrial size was moderately dilated.  Right Atrium: Right atrial size was mildly dilated.  Pericardium: There is no evidence of pericardial effusion.  Mitral Valve: The mitral valve has been repaired/replaced. Mild mitral valve regurgitation. No evidence of mitral valve stenosis.  Tricuspid Valve: The tricuspid valve is  normal in structure. Tricuspid valve regurgitation is moderate . No evidence of tricuspid stenosis.  Aortic Valve: The aortic valve is calcified. There is moderate calcification of the aortic valve. Aortic valve regurgitation is mild. No aortic stenosis is present. Aortic valve mean gradient measures 4.3 mmHg. Aortic valve peak gradient measures 7.9 mmHg. Aortic valve area, by VTI measures 2.21 cm.  Pulmonic Valve: The pulmonic valve was grossly normal. Pulmonic valve regurgitation is trivial. No evidence of pulmonic stenosis.  Aorta: The aortic root is normal in size and structure.  Venous: The inferior vena cava is dilated in size with greater than 50% respiratory variability, suggesting right atrial pressure of 8 mmHg.  IAS/Shunts: No atrial level shunt detected by color flow Doppler.   LEFT VENTRICLE PLAX 2D LVIDd:         3.50 cm LVIDs:         2.10 cm LV PW:         1.20 cm LV IVS:        1.40 cm LVOT diam:     2.20 cm LV SV:         55 LV SV Index:   31 LVOT Area:     3.80 cm   RIGHT VENTRICLE             IVC RV S prime:     11.30 cm/s  IVC diam: 2.40 cm TAPSE (M-mode): 1.2 cm  LEFT ATRIUM            Index        RIGHT ATRIUM           Index LA diam:      3.80 cm  2.15 cm/m   RA Area:     17.10 cm LA Vol (A2C): 82.0 ml  46.34 ml/m  RA Volume:   45.50 ml  25.71 ml/m LA Vol (A4C): 100.0 ml 56.51 ml/m AORTIC VALVE                    PULMONIC VALVE AV Area (Vmax):    1.96 cm     PV Vmax:          0.98 m/s AV Area (Vmean):   1.90 cm     PV Peak grad:     3.8 mmHg AV Area (VTI):     2.21 cm     PR End Diast Vel: 10.76 msec AV Vmax:           140.23 cm/s AV Vmean:          93.367 cm/s AV VTI:            0.247 m AV Peak Grad:      7.9 mmHg AV Mean Grad:  4.3 mmHg LVOT Vmax:         72.25 cm/s LVOT Vmean:        46.600 cm/s LVOT VTI:          0.144 m LVOT/AV VTI ratio: 0.58 AR Vena Contracta: 0.40 cm  AORTA Ao Root diam: 3.70 cm Ao Asc diam:  3.50  cm  MITRAL VALVE                TRICUSPID VALVE MV Area (PHT): 4.37 cm     TV Peak grad:   38.9 mmHg MV Decel Time: 174 msec     TV Vmax:        3.12 m/s MV E velocity: 169.50 cm/s  TR Peak grad:   38.9 mmHg MV A velocity: 61.55 cm/s   TR Vmax:        312.00 cm/s MV E/A ratio:  2.75 SHUNTS Systemic VTI:  0.14 m Systemic Diam: 2.20 cm  Aditya Sabharwal Electronically signed by Dorthula Nettles Signature Date/Time: 01/09/2023/1:18:27 PM    Final  TEE  ECHO TEE 06/18/2020  Narrative TRANSESOPHOGEAL ECHO REPORT    Patient Name:   STACE PEACE Date of Exam: 06/18/2020 Medical Rec #:  [id number]      Height:       67.0 in Accession #:    [id number]     Weight:       154.1 lb Date of Birth:  September 11, 1927      BSA:          1.810 m Patient Age:    89 years       BP:           135/76 mmHg Patient Gender: M              HR:           86 bpm. Exam Location:  Inpatient  Procedure: Transesophageal Echo, Color Doppler, Cardiac Doppler and 3D Echo  Indications:    Severe mitral regurgitation [[id number]]  History:        Patient has prior history of Echocardiogram examinations, most recent 05/22/2020. Arrythmias:Atrial Flutter; Risk Factors:Sleep Apnea. GERD.  Mitral Valve: NTW Mitral Clip valve is present in the mitral position. Procedure Date: 06/18/2020.  Sonographer:    Leta Jungling RDCS Referring Phys: [phone] MICHAEL COOPER  PROCEDURE: After discussion of the risks and benefits of a TEE, an informed consent was obtained from the patient. The patient was intubated. The transesophogeal probe was passed without difficulty through the esophogus of the patient. Imaged were obtained with the patient in a supine position. Sedation performed by different physician. The patient was monitored while under deep sedation. Anesthestetic sedation was provided intravenously by Anesthesiology: 90mg  of Propofol. The patient developed no complications during the procedure.   PRE-PROCEDURE  EXAM  Normal left ventricular systolic function, LVEF 70%. 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 Moderate to severe mitral insufficiency, with the largest jet located at the medial side of P2 (towards P3) with an eccentric, medially directed jet. Myxomatous tricuspid valve with mild-moderate regurgitation. No left atrial thrombus. No pericardial effusion.   POST-PROCEDURE EXAM  Noral left ventricular systolic function, LVEF 65%. Well-deployed XTW MitraClip with stable position and good tissue bridge. There is eccentric, mild-to-moderate residual mitral insufficiency medial to the clip. There is trivial-to-mild residual mitral insufficiency lateral to the clip. Mean mitral valve diastolic gradient is 2 mm Hg. PHT 107 ms. By  summation of the larger lateral and smaller medial mitral neo-orifices the mitral valve area is approximately 1.9 cm sq by planimetry. There is a small (2.5 mm) iatrogenic atrial septal defect in the mid-fossa ovale with exclusively left-to-right shunt. No pericardial effusion.  IMPRESSIONS   1. Left ventricular ejection fraction, by estimation, is 65 to 70%. The left ventricle has normal function. The left ventricle has no regional wall motion abnormalities. Left ventricular diastolic function could not be evaluated. 2. Right ventricular systolic function is normal. The right ventricular size is normal. There is normal pulmonary artery systolic pressure. 3. Left atrial size was mildly dilated. No left atrial/left atrial appendage thrombus was detected. 4. The mitral valve is myxomatous. Moderate to severe mitral valve regurgitation. No evidence of mitral stenosis. There is severe holosystolic prolapse of the middle scallop of the posterior leaflet of the mitral valve. The mean mitral valve gradient is 1.6 mmHg. There is a NTW Mitral Clip present in the mitral position. Procedure  Date: 06/18/2020. 5. The tricuspid valve is myxomatous. Tricuspid valve regurgitation is mild to moderate. 6. The aortic valve is tricuspid. Aortic valve regurgitation is mild. Mild to moderate aortic valve sclerosis/calcification is present, without any evidence of aortic stenosis. 7. There is Moderate (Grade III) layered plaque involving the descending aorta.  FINDINGS Left Ventricle: Left ventricular ejection fraction, by estimation, is 65 to 70%. The left ventricle has normal function. The left ventricle has no regional wall motion abnormalities. The left ventricular internal cavity size was normal in size. There is no left ventricular hypertrophy. Left ventricular diastolic function could not be evaluated due to atrial fibrillation. Left ventricular diastolic function could not be evaluated.  Right Ventricle: The right ventricular size is normal. No increase in right ventricular wall thickness. Right ventricular systolic function is normal. There is normal pulmonary artery systolic pressure. The tricuspid regurgitant velocity is 1.95 m/s, and with an assumed right atrial pressure of 5 mmHg, the estimated right ventricular systolic pressure is 20.2 mmHg.  Left Atrium: Left atrial size was mildly dilated. No left atrial/left atrial appendage thrombus was detected.  Right Atrium: Right atrial size was normal in size.  Pericardium: There is no evidence of pericardial effusion.  Mitral Valve: The mitral valve is myxomatous. There is severe holosystolic prolapse of the middle scallop of the posterior leaflet of the mitral valve. There is moderate thickening of the anterior and posterior mitral valve leaflet(s). Moderate to severe mitral valve regurgitation. There is a NTW Mitral Clip present in the mitral position. Procedure Date: 06/18/2020. No evidence of mitral valve stenosis. The mean mitral valve gradient is 1.6 mmHg with average heart rate of 67 bpm.  Tricuspid Valve: The tricuspid valve is  myxomatous. Tricuspid valve regurgitation is mild to moderate.  Aortic Valve: The aortic valve is tricuspid. Aortic valve regurgitation is mild. Aortic regurgitation PHT measures 497 msec. Mild to moderate aortic valve sclerosis/calcification is present, without any evidence of aortic stenosis.  Pulmonic Valve: The pulmonic valve was normal in structure. Pulmonic valve regurgitation is trivial.  Aorta: The aortic root, ascending aorta and aortic arch are all structurally normal, with no evidence of dilitation or obstruction. There is moderate (Grade III) layered plaque involving the descending aorta.  IAS/Shunts: No atrial level shunt detected by color flow Doppler.   LEFT VENTRICLE PLAX 2D LVOT diam:     2.40 cm LVOT Area:     4.52 cm   AORTIC VALVE AI PHT:      497 msec  MITRAL  VALVE            TRICUSPID VALVE MV Area (PHT): 2.10 cm TR Peak grad:   15.2 mmHg MV Mean grad:  1.6 mmHg TR Vmax:        195.00 cm/s MV Decel Time: 361 msec SHUNTS Systemic Diam: 2.40 cm  Thurmon Fair MD Electronically signed by Thurmon Fair MD Signature Date/Time: 06/18/2020/1:08:12 PM    Final              Current Reported Medications:.    Current Meds  Medication Sig   acetaminophen (TYLENOL) 500 MG tablet Take 500 mg by mouth in the morning, at noon, and at bedtime.   Calcium Carb-Cholecalciferol (CALCIUM 500 +D) 500-10 MG-MCG TABS Take 1 tablet by mouth daily.   cephALEXin (KEFLEX) 500 MG capsule Take 500 mg by mouth 3 (three) times daily.   cholecalciferol (VITAMIN D3) 25 MCG (1000 UNIT) tablet Take 1,000 Units by mouth daily.   diclofenac Sodium (VOLTAREN) 1 % GEL Apply 2 g topically 4 (four) times daily.   feeding supplement (ENSURE ENLIVE / ENSURE PLUS) LIQD Take 237 mLs by mouth 2 (two) times daily between meals.   furosemide (LASIX) 20 MG tablet Take 20 mg once a day for the next three day's then decrease to 20 mg every other day.   gabapentin (NEURONTIN) 300 MG capsule Take  300-600 mg by mouth See admin instructions. Give 300 mg (1 capsule) in the mornings and 600 mg (2 capsules) at bedtime.   guaiFENesin (MUCINEX) 600 MG 12 hr tablet Take 600 mg by mouth 2 (two) times daily.   Lecithin 1200 MG CAPS Take 1,200 mg by mouth daily.   Misc Natural Products (SAW PALMETTO) CAPS Take 600 mg by mouth daily.   montelukast (SINGULAIR) 10 MG tablet Take 10 mg by mouth daily.   Multiple Vitamin (DAILY VITE) TABS Take 1 tablet by mouth daily.   Multiple Vitamins-Minerals (PRESERVISION AREDS 2) CAPS Take 1 capsule by mouth 2 (two) times daily.   pantoprazole (PROTONIX) 40 MG tablet TAKE 1 TABLET BY MOUTH DAILY. --DX: GERD   Probiotic Product (PHILLIPS COLON HEALTH) CAPS Take 1 capsule by mouth daily.   rivastigmine (EXELON) 3 MG capsule Take 3 mg by mouth 2 (two) times daily.   tamsulosin (FLOMAX) 0.4 MG CAPS capsule Take 1 capsule (0.4 mg total) by mouth daily.    Physical Exam:    VS:  BP 134/74   Pulse 75   Ht 5\' 7"  (1.702 m)   Wt 159 lb (72.1 kg)   SpO2 96%   BMI 24.90 kg/m    Wt Readings from Last 3 Encounters:  04/17/23 159 lb (72.1 kg)  03/22/23 155 lb (70.3 kg)  11/04/22 150 lb (68 kg)    GEN: Well nourished, well developed in no acute distress NECK: No JVD; No carotid bruits CARDIAC: Irregular RR, no murmurs, rubs, gallops RESPIRATORY:  Clear to auscultation without rales, wheezing or rhonchi  ABDOMEN: Soft, non-tender, non-distended EXTREMITIES:  Mild bilateral ankle edema; No acute deformity   Asessement and Plan:.    Chronic diastolic HF/shortness of breath/lower extremity edema: Echo on 01/01/2023 indicated LVEF 60 to 65%, no RWMA, moderate concentric LVH, diastolic function could not be evaluated, RV systolic function was low normal, moderately elevated pulmonary artery systolic pressures.  Last office visit he noted increased shortness of breath and lower extremity edema.  BNP was 369.3.  She was started on Lasix 20 mg for 3 days then 32 every other  day.  Today he and his son report that his breathing has somewhat improved, his lower extremity edema appears improved.  His lung sounds are clear. Will check BMET today. Continue Lasix 20 mg every other day  Severe mitral insufficiency: S/p mitral valve clip in 06/2020.  Stable on echo on 01/09/2023.  Hypertension: Blood pressure today 132/74. Continue Lasix 20 mg every other day.   Persistent atrial fibrillation: Heart rate well-controlled today at 75. Not on anticoagulation due to prior history of intracerebral hemorrhage.     Disposition: F/u with Dr. Flora [name] in three months (pt request).   Signed, Rip Harbour, NP

## 2023-04-17 ENCOUNTER — Encounter: Payer: Self-pay | Admitting: Cardiology

## 2023-04-17 ENCOUNTER — Ambulatory Visit: Payer: Medicare HMO | Attending: Cardiology | Admitting: Cardiology

## 2023-04-17 VITALS — BP 134/74 | HR 75 | Ht 67.0 in | Wt 159.0 lb

## 2023-04-17 DIAGNOSIS — I10 Essential (primary) hypertension: Secondary | ICD-10-CM | POA: Diagnosis not present

## 2023-04-17 DIAGNOSIS — I4819 Other persistent atrial fibrillation: Secondary | ICD-10-CM

## 2023-04-17 DIAGNOSIS — I5032 Chronic diastolic (congestive) heart failure: Secondary | ICD-10-CM | POA: Diagnosis not present

## 2023-04-17 DIAGNOSIS — Z95818 Presence of other cardiac implants and grafts: Secondary | ICD-10-CM

## 2023-04-17 DIAGNOSIS — I340 Nonrheumatic mitral (valve) insufficiency: Secondary | ICD-10-CM | POA: Diagnosis not present

## 2023-04-17 DIAGNOSIS — Z9889 Other specified postprocedural states: Secondary | ICD-10-CM | POA: Diagnosis not present

## 2023-04-17 NOTE — Patient Instructions (Addendum)
Medication Instructions:  No changes *If you need a refill on your cardiac medications before your next appointment, please call your pharmacy*  Lab Work: Today we are going to draw BMet If you have labs (blood work) drawn today and your tests are completely normal, you will receive your results only by: MyChart Message (if you have MyChart) OR A paper copy in the mail If you have any lab test that is abnormal or we need to change your treatment, we will call you to review the results.  Testing/Procedures: No testing  Follow-Up: At [facility], you and your health needs are our priority.  As part of our continuing mission to provide you with exceptional heart care, we have created designated Provider Care Teams.  These Care Teams include your primary Cardiologist (physician) and Advanced Practice Providers (APPs -  Physician Assistants and Nurse Practitioners) who all work together to provide you with the care you need, when you need it.  We recommend signing up for the patient portal called "MyChart".  Sign up information is provided on this After Visit Summary.  MyChart is used to connect with patients for Virtual Visits (Telemedicine).  Patients are able to view lab/test results, encounter notes, upcoming appointments, etc.  Non-urgent messages can be sent to your provider as well.   To learn more about what you can do with MyChart, go to [email or web address].    Your next appointment:   3 month(s)  Provider:   Reatha Harps, MD

## 2023-04-18 ENCOUNTER — Encounter: Payer: Self-pay | Admitting: Cardiology

## 2023-04-18 LAB — BASIC METABOLIC PANEL
BUN/Creatinine Ratio: 28 — ABNORMAL HIGH (ref 10–24)
BUN: 45 mg/dL — ABNORMAL HIGH (ref 10–36)
CO2: 19 mmol/L — ABNORMAL LOW (ref 20–29)
Calcium: 8.9 mg/dL (ref 8.6–10.2)
Chloride: 98 mmol/L (ref 96–106)
Creatinine, Ser: 1.63 mg/dL — ABNORMAL HIGH (ref 0.76–1.27)
Sodium: 131 mmol/L — ABNORMAL LOW (ref 134–144)
eGFR: 39 mL/min/{1.73_m2} — ABNORMAL LOW (ref >59)

## 2023-04-19 ENCOUNTER — Telehealth: Payer: Self-pay

## 2023-04-19 DIAGNOSIS — I340 Nonrheumatic mitral (valve) insufficiency: Secondary | ICD-10-CM

## 2023-04-19 DIAGNOSIS — I5032 Chronic diastolic (congestive) heart failure: Secondary | ICD-10-CM

## 2023-04-19 NOTE — Telephone Encounter (Signed)
 Called patient advised of below they verbalized understanding.

## 2023-04-19 NOTE — Addendum Note (Signed)
Addended by: Clotilde Dieter on: 04/19/2023 05:30 PM   Modules accepted: Orders

## 2023-04-19 NOTE — Telephone Encounter (Signed)
-----   Message from Katlyn D [place] sent at 04/18/2023  2:21 PM EST ----- Please let Mr. Adrian [name] and his sons know that his BMET showed his kidney function is close to baseline, unfortunately the lab was unable to analyze his potassium and glucose levels. He will need a repeat BMET, recommend collection at another [facility].

## 2023-04-21 ENCOUNTER — Telehealth: Payer: Self-pay | Admitting: Internal Medicine

## 2023-04-21 NOTE — Telephone Encounter (Signed)
 I received a message from [facility] about a critical lab.  Potassium level from labs drawn on 04/21/23 elevated to 5.8.  I attempted to call the patient to discuss lab results and next steps.  There was no answer.  I left a HIPAA compliant voicemail that we are trying to reach him.  Will forward to ordering provider for further evaluation/management.

## 2023-04-22 LAB — BASIC METABOLIC PANEL
BUN/Creatinine Ratio: 26 — ABNORMAL HIGH (ref 10–24)
BUN: 44 mg/dL — ABNORMAL HIGH (ref 10–36)
CO2: 17 mmol/L — ABNORMAL LOW (ref 20–29)
Calcium: 8.8 mg/dL (ref 8.6–10.2)
Chloride: 98 mmol/L (ref 96–106)
Creatinine, Ser: 1.68 mg/dL — ABNORMAL HIGH (ref 0.76–1.27)
Glucose: 92 mg/dL (ref 70–99)
Potassium: 5.8 mmol/L (ref 3.5–5.2)
Sodium: 131 mmol/L — ABNORMAL LOW (ref 134–144)
eGFR: 38 mL/min/{1.73_m2} — ABNORMAL LOW (ref >59)

## 2023-04-24 ENCOUNTER — Encounter (HOSPITAL_BASED_OUTPATIENT_CLINIC_OR_DEPARTMENT_OTHER): Payer: Self-pay

## 2023-04-24 ENCOUNTER — Telehealth: Payer: Self-pay | Admitting: Cardiology

## 2023-04-24 ENCOUNTER — Other Ambulatory Visit: Payer: Self-pay

## 2023-04-24 ENCOUNTER — Telehealth: Payer: Self-pay

## 2023-04-24 ENCOUNTER — Emergency Department (HOSPITAL_BASED_OUTPATIENT_CLINIC_OR_DEPARTMENT_OTHER)
Admission: EM | Admit: 2023-04-24 | Discharge: 2023-04-24 | Disposition: A | Discharge status: Home / Self Care | Payer: Medicare HMO | Attending: Emergency Medicine | Admitting: Emergency Medicine

## 2023-04-24 DIAGNOSIS — E875 Hyperkalemia: Secondary | ICD-10-CM | POA: Diagnosis present

## 2023-04-24 DIAGNOSIS — E876 Hypokalemia: Secondary | ICD-10-CM

## 2023-04-24 LAB — BASIC METABOLIC PANEL
Anion gap: 8 (ref 5–15)
BUN/Creatinine Ratio: 30 — ABNORMAL HIGH (ref 10–24)
BUN: 44 mg/dL — ABNORMAL HIGH (ref 10–36)
BUN: 41 mg/dL — ABNORMAL HIGH (ref 8–23)
CO2: 23 mmol/L (ref 20–29)
CO2: 22 mmol/L (ref 22–32)
Calcium: 8.8 mg/dL (ref 8.6–10.2)
Calcium: 9 mg/dL (ref 8.9–10.3)
Chloride: 97 mmol/L (ref 96–106)
Chloride: 97 mmol/L — ABNORMAL LOW (ref 98–111)
Creatinine, Ser: 1.49 mg/dL — ABNORMAL HIGH (ref 0.76–1.27)
Creatinine, Ser: 1.53 mg/dL — ABNORMAL HIGH (ref 0.61–1.24)
GFR, Estimated: 42 mL/min — ABNORMAL LOW (ref >60)
Glucose, Bld: 96 mg/dL (ref 70–99)
Glucose: 95 mg/dL (ref 70–99)
Potassium: 6.4 mmol/L (ref 3.5–5.2)
Potassium: 5.6 mmol/L — ABNORMAL HIGH (ref 3.5–5.1)
Sodium: 128 mmol/L — ABNORMAL LOW (ref 134–144)
Sodium: 127 mmol/L — ABNORMAL LOW (ref 135–145)
eGFR: 43 mL/min/{1.73_m2} — ABNORMAL LOW (ref >59)

## 2023-04-24 LAB — CBC
HCT: 32.9 % — ABNORMAL LOW (ref 39.0–52.0)
Hemoglobin: 10.6 g/dL — ABNORMAL LOW (ref 13.0–17.0)
MCH: 30.2 pg (ref 26.0–34.0)
MCHC: 32.2 g/dL (ref 30.0–36.0)
MCV: 93.7 fL (ref 80.0–100.0)
Platelets: 184 10*3/uL (ref 150–400)
RBC: 3.51 MIL/uL — ABNORMAL LOW (ref 4.22–5.81)
RDW: 17.4 % — ABNORMAL HIGH (ref 11.5–15.5)
WBC: 6.2 10*3/uL (ref 4.0–10.5)
nRBC: 0 % (ref 0.0–0.2)

## 2023-04-24 MED ORDER — SODIUM ZIRCONIUM CYCLOSILICATE 10 G PO PACK
10.0000 g | PACK | ORAL | Status: AC
  Administered 2023-04-24: 10 g via ORAL
  Filled 2023-04-24: qty 1

## 2023-04-24 MED ORDER — LOKELMA 5 G PO PACK: 10.0000 g | PACK | Freq: Two times a day (BID) | ORAL | 0 refills | Status: AC

## 2023-04-24 NOTE — ED Triage Notes (Signed)
 In for eval of hyperkalemia. Friday K 5.8, today K 6.4. Admitted for same last month.

## 2023-04-24 NOTE — Telephone Encounter (Signed)
 Stat Lab Results

## 2023-04-24 NOTE — Telephone Encounter (Signed)
 Called patients son advised of below they verbalized understanding Ordered Lab work again and found out patient was drinking orange juice in the morning with his iron supplement.

## 2023-04-24 NOTE — Discharge Instructions (Signed)
 Take the dose of Lokelma  as prescribed tomorrow.  Prescription provided.  Also will need to have potassium rechecked and sodium rechecked at the end of the week.  Continue all other current medications.  Potassium here was 5.6.  So better than the 6.4 that they got earlier or better than the 5.8 they got on Friday.  Return for any new or worse symptoms.

## 2023-04-24 NOTE — Telephone Encounter (Signed)
 Called patient's son per Katlyn West NP we are sending patient to the ED at [facility].

## 2023-04-24 NOTE — ED Provider Notes (Signed)
 [place] EMERGENCY DEPARTMENT AT [facility] Provider Note   CSN: [id number] Arrival date & time: 04/24/23  1710     History  Chief Complaint  Patient presents with   Hyperkalemia    Adrian [name] is a 89 y.o. male.  Patient sent in for hyperkalemia.  Patient potassium on Friday was 5.8 today was recorded at 6.4.  We repeated here and we got 5.6.  Patient not on any potassium supplementation but has been drinking a lot of orange juice.  Could be responsible.  Patient without any other complaints.  Patient does have a history of congestive heart failure.  Oxygen saturation is 95% at front [place] 91% here.  There was some shortness of breath earlier.  But family member states that usually occurs when he has to transfer do you to his wheelchair to go to the bathroom.  Past medical history significant for GERD status post mitral valve clip implementation 2022 persistent atrial fibrillation also EKGs showed atrial flutter.  History of intracranial hemorrhage.  Patient is on Lasix .  20 mg every other day.  Patient is never used tobacco products.       Home Medications Prior to Admission medications   Medication Sig Start Date End Date Taking? Authorizing Provider  acetaminophen  (TYLENOL ) 500 MG tablet Take 500 mg by mouth in the morning, at noon, and at bedtime.    [provider]  Calcium Carb-Cholecalciferol (CALCIUM 500 +D) 500-10 MG-MCG TABS Take 1 tablet by mouth daily.    [provider]  cephALEXin (KEFLEX) 500 MG capsule Take 500 mg by mouth 3 (three) times daily. 03/20/23   [provider]  cholecalciferol (VITAMIN D3) 25 MCG (1000 UNIT) tablet Take 1,000 Units by mouth daily.    [provider]  diclofenac  Sodium (VOLTAREN ) 1 % GEL Apply 2 g topically 4 (four) times daily. 01/10/23   Dameron, Marisa, DO  feeding supplement (ENSURE ENLIVE / ENSURE PLUS) LIQD Take 237 mLs by mouth 2 (two) times daily between meals. 01/10/23   Dameron,  Marisa, DO  furosemide  (LASIX ) 20 MG tablet Take 20 mg once a day for the next three day's then decrease to 20 mg every other day. 03/22/23   West, Katlyn D, NP  gabapentin  (NEURONTIN ) 300 MG capsule Take 300-600 mg by mouth See admin instructions. Give 300 mg (1 capsule) in the mornings and 600 mg (2 capsules) at bedtime. 04/12/21   [provider]  guaiFENesin  (MUCINEX ) 600 MG 12 hr tablet Take 600 mg by mouth 2 (two) times daily.    [provider]  Lecithin 1200 MG CAPS Take 1,200 mg by mouth daily.    [provider]  Misc Natural Products (SAW PALMETTO) CAPS Take 600 mg by mouth daily.    [provider]  montelukast  (SINGULAIR ) 10 MG tablet Take 10 mg by mouth daily.    [provider]  Multiple Vitamin (DAILY VITE) TABS Take 1 tablet by mouth daily.    [provider]  Multiple Vitamins-Minerals (PRESERVISION AREDS 2) CAPS Take 1 capsule by mouth 2 (two) times daily.    [provider]  pantoprazole  (PROTONIX ) 40 MG tablet TAKE 1 TABLET BY MOUTH DAILY. --DX: GERD 06/07/22   O'Neal, Cathay Clonts, MD  Probiotic Product [facility] COLON HEALTH) CAPS Take 1 capsule by mouth daily.    [provider]  rivastigmine  (EXELON ) 3 MG capsule Take 3 mg by mouth 2 (two) times daily. 04/11/20   [provider]  tamsulosin  (FLOMAX )  0.4 MG CAPS capsule Take 1 capsule (0.4 mg total) by mouth daily. 01/10/23   Dameron, Marisa, DO      Allergies    Patient has no known allergies.    Review of Systems   Review of Systems  Constitutional:  Negative for chills and fever.  HENT:  Negative for ear pain and sore throat.   Eyes:  Negative for pain and visual disturbance.  Respiratory:  Negative for cough and shortness of breath.   Cardiovascular:  Negative for chest pain and palpitations.  Gastrointestinal:  Negative for abdominal pain and vomiting.  Genitourinary:  Negative for dysuria and hematuria.  Musculoskeletal:  Negative for  arthralgias and back pain.  Skin:  Negative for color change and rash.  Neurological:  Negative for seizures and syncope.  All other systems reviewed and are negative.   Physical Exam Updated Vital Signs BP (!) 149/85   Pulse 76   Temp 99.3 F (37.4 C)   Resp 14   Ht 1.702 m (5\' 7" )   Wt 72.1 kg   SpO2 95%   BMI 24.90 kg/m  Physical Exam Vitals and nursing note reviewed.  Constitutional:      General: He is not in acute distress.    Appearance: Normal appearance. He is well-developed.  HENT:     Head: Normocephalic and atraumatic.     Mouth/Throat:     Mouth: Mucous membranes are moist.  Eyes:     Conjunctiva/sclera: Conjunctivae normal.  Cardiovascular:     Rate and Rhythm: Normal rate and regular rhythm.     Heart sounds: No murmur heard. Pulmonary:     Effort: Pulmonary effort is normal. No respiratory distress.     Breath sounds: Normal breath sounds. No wheezing, rhonchi or rales.  Abdominal:     Palpations: Abdomen is soft.     Tenderness: There is no abdominal tenderness.  Musculoskeletal:        General: No swelling.     Cervical back: Normal range of motion and neck supple.  Skin:    General: Skin is warm and dry.     Capillary Refill: Capillary refill takes less than 2 seconds.  Neurological:     Mental Status: He is alert. Mental status is at baseline.  Psychiatric:        Mood and Affect: Mood normal.     ED Results / Procedures / Treatments   Labs (all labs ordered are listed, but only abnormal results are displayed) Labs Reviewed  CBC - Abnormal; Notable for the following components:      Result Value   RBC 3.51 (*)    Hemoglobin 10.6 (*)    HCT 32.9 (*)    RDW 17.4 (*)    All other components within normal limits  BASIC METABOLIC PANEL - Abnormal; Notable for the following components:   Sodium 127 (*)    Potassium 5.6 (*)    Chloride 97 (*)    BUN 41 (*)    Creatinine, Ser 1.53 (*)    GFR, Estimated 42 (*)    All other components  within normal limits    EKG EKG Interpretation Date/Time:  Monday April 24 2023 17:49:32 EST Ventricular Rate:  91 PR Interval:    QRS Duration:  90 QT Interval:  410 QTC Calculation: 504 R Axis:   -45  Text Interpretation: Undetermined rhythm Left anterior fascicular block Possible Anterior infarct , age undetermined Prolonged QT Abnormal ECG ? Atrial Flutter When compared with ECG  of 23-Mar-2023 09:39, PREVIOUS ECG IS PRESENT Confirmed by Eann Cleland [phone]) on 04/24/2023 6:49:33 PM  Radiology No results found.  Procedures Procedures    Medications Ordered in ED Medications  sodium zirconium cyclosilicate  (LOKELMA ) packet 10 g (has no administration in time range)    ED Course/ Medical Decision Making/ A&P                                 Medical Decision Making Risk Prescription drug management.   EKG here today probably atrial flutter.  No significant changes in EKG.  CBC white count 6.2 hemoglobin 10.6 platelets 184.  Basic metabolic panel sodium down a little bit at 127 potassium at 5.6 chloride 97 glucose 96 GFR 42 actually little bit improved for him with a creatinine 1.53.  Sodium a little low but not in danger zone.  And with lungs currently being clear.  Will not give any fluids.  Will give dose of Lokelma  here and have him take another dose tomorrow and they can follow-up with the potassium with primary care doctor  Will need to have both potassium and sodium rechecked.   Final Clinical Impression(s) / ED Diagnoses Final diagnoses:  Hyperkalemia    Rx / DC Orders ED Discharge Orders     None         Nicklas Barns, MD 04/24/23 [phone]

## 2023-04-24 NOTE — Telephone Encounter (Signed)
 Spoke with [facility] Critical lab results from 04/24/23 K+ 6.4

## 2023-04-24 NOTE — Telephone Encounter (Signed)
-----   Message from Katlyn D [place] sent at 04/24/2023  9:55 AM EST ----- Please call Mr. Medica and his son, let them know his potassium is shown to be elevated at 5.8. He needs to have a repeat BMET today. Recommend he stop any supplements or electrolyte beverages such as Gatorade or liquid IV. He should reduce intake of high potassium foods such as bananas, squash, yogurt, white beans, sweet potatoes, leafy greens, and avocados.

## 2023-05-17 ENCOUNTER — Encounter (HOSPITAL_COMMUNITY): Payer: Self-pay | Admitting: Emergency Medicine

## 2023-05-17 ENCOUNTER — Emergency Department (HOSPITAL_COMMUNITY)
Admission: EM | Admit: 2023-05-17 | Discharge: 2023-05-17 | Disposition: A | Discharge status: Skilled Nursing Facility | Attending: Emergency Medicine | Admitting: Emergency Medicine

## 2023-05-17 ENCOUNTER — Emergency Department (HOSPITAL_COMMUNITY)

## 2023-05-17 DIAGNOSIS — R4182 Altered mental status, unspecified: Secondary | ICD-10-CM | POA: Insufficient documentation

## 2023-05-17 DIAGNOSIS — F0390 Unspecified dementia without behavioral disturbance: Secondary | ICD-10-CM | POA: Diagnosis not present

## 2023-05-17 DIAGNOSIS — I272 Pulmonary hypertension, unspecified: Secondary | ICD-10-CM | POA: Diagnosis not present

## 2023-05-17 DIAGNOSIS — K8020 Calculus of gallbladder without cholecystitis without obstruction: Secondary | ICD-10-CM | POA: Diagnosis not present

## 2023-05-17 DIAGNOSIS — K5730 Diverticulosis of large intestine without perforation or abscess without bleeding: Secondary | ICD-10-CM | POA: Diagnosis not present

## 2023-05-17 DIAGNOSIS — K529 Noninfective gastroenteritis and colitis, unspecified: Secondary | ICD-10-CM | POA: Diagnosis not present

## 2023-05-17 DIAGNOSIS — R9082 White matter disease, unspecified: Secondary | ICD-10-CM | POA: Diagnosis not present

## 2023-05-17 DIAGNOSIS — I700 Atherosclerosis of aorta: Secondary | ICD-10-CM | POA: Insufficient documentation

## 2023-05-17 DIAGNOSIS — N400 Enlarged prostate without lower urinary tract symptoms: Secondary | ICD-10-CM | POA: Insufficient documentation

## 2023-05-17 DIAGNOSIS — I4891 Unspecified atrial fibrillation: Secondary | ICD-10-CM | POA: Diagnosis not present

## 2023-05-17 DIAGNOSIS — K449 Diaphragmatic hernia without obstruction or gangrene: Secondary | ICD-10-CM | POA: Diagnosis not present

## 2023-05-17 DIAGNOSIS — R112 Nausea with vomiting, unspecified: Secondary | ICD-10-CM | POA: Diagnosis present

## 2023-05-17 DIAGNOSIS — I672 Cerebral atherosclerosis: Secondary | ICD-10-CM | POA: Insufficient documentation

## 2023-05-17 DIAGNOSIS — N179 Acute kidney failure, unspecified: Secondary | ICD-10-CM | POA: Diagnosis not present

## 2023-05-17 DIAGNOSIS — I517 Cardiomegaly: Secondary | ICD-10-CM | POA: Diagnosis not present

## 2023-05-17 LAB — BLOOD GAS, VENOUS
Acid-base deficit: 7.8 mmol/L — ABNORMAL HIGH (ref 0.0–2.0)
Bicarbonate: 17.6 mmol/L — ABNORMAL LOW (ref 20.0–28.0)
O2 Saturation: 51.4 %
Patient temperature: 37
pCO2, Ven: 35 mmHg — ABNORMAL LOW (ref 44–60)
pH, Ven: 7.31 (ref 7.25–7.43)
pO2, Ven: 33 mmHg (ref 32–45)

## 2023-05-17 LAB — COMPREHENSIVE METABOLIC PANEL
ALT: 13 U/L (ref 0–44)
AST: 17 U/L (ref 15–41)
Albumin: 3.5 g/dL (ref 3.5–5.0)
Alkaline Phosphatase: 91 U/L (ref 38–126)
Anion gap: 11 (ref 5–15)
BUN: 72 mg/dL — ABNORMAL HIGH (ref 8–23)
CO2: 16 mmol/L — ABNORMAL LOW (ref 22–32)
Calcium: 8.4 mg/dL — ABNORMAL LOW (ref 8.9–10.3)
Chloride: 107 mmol/L (ref 98–111)
Creatinine, Ser: 1.58 mg/dL — ABNORMAL HIGH (ref 0.61–1.24)
GFR, Estimated: 41 mL/min — ABNORMAL LOW (ref >60)
Glucose, Bld: 129 mg/dL — ABNORMAL HIGH (ref 70–99)
Potassium: 4.3 mmol/L (ref 3.5–5.1)
Sodium: 134 mmol/L — ABNORMAL LOW (ref 135–145)
Total Bilirubin: 0.8 mg/dL (ref 0.0–1.2)
Total Protein: 6.4 g/dL — ABNORMAL LOW (ref 6.5–8.1)

## 2023-05-17 LAB — CBC WITH DIFFERENTIAL/PLATELET
Abs Immature Granulocytes: 0.03 10*3/uL (ref 0.00–0.07)
Basophils Absolute: 0 10*3/uL (ref 0.0–0.1)
Basophils Relative: 0 %
Eosinophils Absolute: 0 10*3/uL (ref 0.0–0.5)
Eosinophils Relative: 0 %
HCT: 36.7 % — ABNORMAL LOW (ref 39.0–52.0)
Hemoglobin: 11.6 g/dL — ABNORMAL LOW (ref 13.0–17.0)
Immature Granulocytes: 0 %
Lymphocytes Relative: 8 %
Lymphs Abs: 0.5 10*3/uL — ABNORMAL LOW (ref 0.7–4.0)
MCH: 30.5 pg (ref 26.0–34.0)
MCHC: 31.6 g/dL (ref 30.0–36.0)
MCV: 96.6 fL (ref 80.0–100.0)
Monocytes Absolute: 0.5 10*3/uL (ref 0.1–1.0)
Monocytes Relative: 7 %
Neutro Abs: 5.8 10*3/uL (ref 1.7–7.7)
Neutrophils Relative %: 85 %
Platelets: 179 10*3/uL (ref 150–400)
RBC: 3.8 MIL/uL — ABNORMAL LOW (ref 4.22–5.81)
RDW: 15 % (ref 11.5–15.5)
WBC: 6.9 10*3/uL (ref 4.0–10.5)
nRBC: 0 % (ref 0.0–0.2)

## 2023-05-17 LAB — LACTIC ACID, PLASMA: Lactic Acid, Venous: 1.3 mmol/L (ref 0.5–1.9)

## 2023-05-17 LAB — RESP PANEL BY RT-PCR (RSV, FLU A&B, COVID)  RVPGX2
Influenza A by PCR: NEGATIVE
Influenza B by PCR: NEGATIVE
Resp Syncytial Virus by PCR: NEGATIVE
SARS Coronavirus 2 by RT PCR: NEGATIVE

## 2023-05-17 LAB — LIPASE, BLOOD: Lipase: 39 U/L (ref 11–51)

## 2023-05-17 MED ORDER — SODIUM CHLORIDE 0.9 % IV BOLUS
500.0000 mL | Freq: Once | INTRAVENOUS | Status: AC
Start: 2023-05-17 — End: 2023-05-17
  Administered 2023-05-17: 500 mL via INTRAVENOUS

## 2023-05-17 NOTE — ED Provider Notes (Signed)
 Received signout from previous provider, please see his note for complete H&P.  This is a 89 year old male with advanced dementia, A-fib not on blood thinner medication, severe mitral valve with mitral valve clip implantation brought here from [facility] facility for evaluation of nausea vomiting diarrhea.  Overnight provider was unable to reach in touch with anyone from his facility to get collateral information therefore extensive workup was initiated and.  Labs and imaging obtained.  Labs remarkable for evidence of AKI with a creatinine of 1.58.  Gentle hydration was given.  A CT scan of the abdomen pelvis was obtained independent viewed inter by me showing evidence of enteritis which is consistent with patient's presentation.  At this time patient's son is now at bedside and confirms that there are widespread viral GI illness at the facility which is likely a contributor to his presentation.  He is at baseline according to the son and they felt comfortable with discharge back to the facility.   BP (!) 152/90   Pulse 82   Temp 98.2 F (36.8 C)   Resp 18   Ht 5\' 7"  (1.702 m)   Wt 72.2 kg   SpO2 98%   BMI 24.93 kg/m   Results for orders placed or performed during the hospital encounter of 05/17/23  CBC with Differential   Collection Time: 05/17/23  3:50 AM  Result Value Ref Range   WBC 6.9 4.0 - 10.5 K/uL   RBC 3.80 (L) 4.22 - 5.81 MIL/uL   Hemoglobin 11.6 (L) 13.0 - 17.0 g/dL   HCT 28.4 (L) 13.2 - 44.0 %   MCV 96.6 80.0 - 100.0 fL   MCH 30.5 26.0 - 34.0 pg   MCHC 31.6 30.0 - 36.0 g/dL   RDW 10.2 72.5 - 36.6 %   Platelets 179 150 - 400 K/uL   nRBC 0.0 0.0 - 0.2 %   Neutrophils Relative % 85 %   Neutro Abs 5.8 1.7 - 7.7 K/uL   Lymphocytes Relative 8 %   Lymphs Abs 0.5 (L) 0.7 - 4.0 K/uL   Monocytes Relative 7 %   Monocytes Absolute 0.5 0.1 - 1.0 K/uL   Eosinophils Relative 0 %   Eosinophils Absolute 0.0 0.0 - 0.5 K/uL   Basophils Relative 0 %   Basophils Absolute 0.0 0.0 -  0.1 K/uL   Immature Granulocytes 0 %   Abs Immature Granulocytes 0.03 0.00 - 0.07 K/uL  Comprehensive metabolic panel   Collection Time: 05/17/23  3:50 AM  Result Value Ref Range   Sodium 134 (L) 135 - 145 mmol/L   Potassium 4.3 3.5 - 5.1 mmol/L   Chloride 107 98 - 111 mmol/L   CO2 16 (L) 22 - 32 mmol/L   Glucose, Bld 129 (H) 70 - 99 mg/dL   BUN 72 (H) 8 - 23 mg/dL   Creatinine, Ser 4.40 (H) 0.61 - 1.24 mg/dL   Calcium 8.4 (L) 8.9 - 10.3 mg/dL   Total Protein 6.4 (L) 6.5 - 8.1 g/dL   Albumin 3.5 3.5 - 5.0 g/dL   AST 17 15 - 41 U/L   ALT 13 0 - 44 U/L   Alkaline Phosphatase 91 38 - 126 U/L   Total Bilirubin 0.8 0.0 - 1.2 mg/dL   GFR, Estimated 41 (L) >60 mL/min   Anion gap 11 5 - 15  Lipase, blood   Collection Time: 05/17/23  3:50 AM  Result Value Ref Range   Lipase 39 11 - 51 U/L  Resp panel  by RT-PCR (RSV, Flu A&B, Covid) Anterior Nasal Swab   Collection Time: 05/17/23  4:13 AM   Specimen: Anterior Nasal Swab  Result Value Ref Range   SARS Coronavirus 2 by RT PCR NEGATIVE NEGATIVE   Influenza A by PCR NEGATIVE NEGATIVE   Influenza B by PCR NEGATIVE NEGATIVE   Resp Syncytial Virus by PCR NEGATIVE NEGATIVE  Lactic acid, plasma   Collection Time: 05/17/23  5:04 AM  Result Value Ref Range   Lactic Acid, Venous 1.3 0.5 - 1.9 mmol/L  Blood gas, venous   Collection Time: 05/17/23  5:12 AM  Result Value Ref Range   pH, Ven 7.31 7.25 - 7.43   pCO2, Ven 35 (L) 44 - 60 mmHg   pO2, Ven 33 32 - 45 mmHg   Bicarbonate 17.6 (L) 20.0 - 28.0 mmol/L   Acid-base deficit 7.8 (H) 0.0 - 2.0 mmol/L   O2 Saturation 51.4 %   Patient temperature 37.0    CT ABDOMEN PELVIS WO CONTRAST Result Date: 05/17/2023 CLINICAL DATA:  Nausea, vomiting and diarrhea. EXAM: CT ABDOMEN AND PELVIS WITHOUT CONTRAST TECHNIQUE: Multidetector CT imaging of the abdomen and pelvis was performed following the standard protocol without IV contrast. RADIATION DOSE REDUCTION: This exam was performed according to the  departmental dose-optimization program which includes automated exposure control, adjustment of the mA and/or kV according to patient size and/or use of iterative reconstruction technique. COMPARISON:  CTA abdomen pelvis 01/08/2023. FINDINGS: Lower chest: There are trace pleural effusions. Loss of fine detail due to abundant breathing motion. No focal lung base infiltrates. The heart is moderately enlarged. There is metallic artifact from prior mitral valve edge to edge repair. There is no substantial pericardial effusion. There is calcification and thickening of the aortic valve leaflets. Two-vessel calcific plaques in the LAD and right coronary artery. There is a large hiatal hernia with about 1/3 of the stomach intrathoracic. Hepatobiliary: Limited fine detail due to breathing motion. No obvious hepatic mass. Tiny layering stones again noted in the distal gallbladder without overt wall thickening or bile duct dilatation. Pancreas: No abnormality is seen through the motion artifacts. Spleen: No abnormality is seen through the motion artifacts. Adrenals/Urinary Tract: There is no adrenal mass. There are bilateral Bosniak 1 simple appearing cysts, largest on the left at 7 cm and 17 Hounsfield units, largest on the right 5.3 cm, 4.2 Hounsfield units. No follow-up imaging is recommended. No new contour deforming abnormality is seen of either kidney allowing for motion. There is no urinary stone or obstruction. Bladder is distended up to the level of L4-5 without focal wall thickening. Stomach/Bowel: Fluid distention of the stomach without wall thickening with large hiatal hernia. There is fluid filling of normal caliber small bowel throughout the abdomen. Normal appendix.  Fluid is seen throughout the colon to the rectum. There is advanced sigmoid diverticulosis. No inflammatory changes seen through the breathing motion. No wall thickening is seen through the breathing motion. Vascular/Lymphatic: There is heavy  aortoiliac and branch vessel atherosclerosis. No AAA. No adenopathy. Reproductive: Enlarged prostate 4.8 cm transverse. Other: Small umbilical and bilateral inguinal fat hernias. No free fluid, free hemorrhage or free air. No incarcerated hernia. Musculoskeletal: Moderate levoscoliosis and degenerative change of the spine. There are moderate anterior wedge compression fracture deformities of T12 and L1 which have a chronic appearance. There is advanced degenerative change of the spine. Grade 1 degenerative anterolisthesis at L5-S1. IMPRESSION: 1. Fluid filling of normal caliber small bowel throughout the abdomen and fluid throughout the  colon to the rectum. No wall thickening or inflammatory changes are seen through the breathing motion. Findings are consistent with nonspecific enteritis/diarrheal illness. 2. Large hiatal hernia with about 1/3 of the stomach intrathoracic. Umbilical and inguinal fat hernias. 3. Cholelithiasis. 4. Advanced sigmoid diverticulosis. 5. Prostatomegaly with distended bladder. 6. Trace pleural effusions. 7. Cardiomegaly with calcification and thickening of the aortic valve leaflets. 8. Coronary artery and heavy aortic and branch vessel atherosclerosis. 9. Chronic appearing moderate anterior wedge compression fracture deformities of T12 and L1. Aortic Atherosclerosis (ICD10-I70.0). Electronically Signed   By: Almira Bar M.D.   On: 05/17/2023 07:50   CT Head Wo Contrast Result Date: 05/17/2023 CLINICAL DATA:  89 year old male with altered mental status. Nausea vomiting and diarrhea. EXAM: CT HEAD WITHOUT CONTRAST TECHNIQUE: Contiguous axial images were obtained from the base of the skull through the vertex without intravenous contrast. RADIATION DOSE REDUCTION: This exam was performed according to the departmental dose-optimization program which includes automated exposure control, adjustment of the mA and/or kV according to patient size and/or use of iterative reconstruction  technique. COMPARISON:  Head CT 02/11/2023. FINDINGS: Brain: Stable cerebral volume since last year. No midline shift, ventriculomegaly, mass effect, evidence of mass lesion, intracranial hemorrhage or evidence of cortically based acute infarction. Mild to moderate for age patchy cerebral white matter hypodensity, gray-white differentiation is stable. Vascular: Calcified atherosclerosis at the skull base. No suspicious intracranial vascular hyperdensity. Skull: Chronic bilateral vertex burr holes. No acute osseous abnormality identified. Sinuses/Orbits: Visualized paranasal sinuses and mastoids are stable and well aerated. Other: Chronic postoperative changes to the orbits and scalp. IMPRESSION: 1. No acute intracranial abnormality. 2. Stable chronic postoperative changes and mild to moderate for age cerebral white matter disease. Electronically Signed   By: Odessa Fleming M.D.   On: 05/17/2023 07:07      Fayrene Helper, PA-C 05/17/23 1308    Terald Sleeper, MD 05/18/23 [phone]

## 2023-05-17 NOTE — Discharge Instructions (Signed)
 Your symptom is due to a viral GI illness, likely norovirus.  Please stay hydrated, eat bland food, and follow-up closely with your doctor for further care.

## 2023-05-17 NOTE — ED Triage Notes (Signed)
 Pt presents to the ED via [facility]  with complaints of N/V/D that started tonight. Denies abdominal pain, fevers, CP, SOB nor chills. A&Ox4 at this time.

## 2023-05-17 NOTE — ED Provider Notes (Signed)
 [place] EMERGENCY DEPARTMENT AT [facility] Provider Note   CSN: [id number] Arrival date & time: 05/17/23  4098     History  No chief complaint on file.   Adrian [name] is a 89 y.o. male history of heart failure, A-fib not on blood thinners, severe mitral regurg status post mitral valve clip implantation presented for nausea vomiting diarrhea.  When I spoke to the patient and patient stated he was here because " his dog told him to come in."  Patient is unable to further elaborate.  Does not appear upon chart review the patient has history of altered mental status.  Patient does know his name and location but does not know the year.  I tried to contact [facility] where patient is from however nobody answered.  Level 5 caveat: Altered mental status Home Medications Prior to Admission medications   Medication Sig Start Date End Date Taking? Authorizing Provider  acetaminophen (TYLENOL) 500 MG tablet Take 500 mg by mouth in the morning, at noon, and at bedtime.    [provider]  Calcium Carb-Cholecalciferol (CALCIUM 500 +D) 500-10 MG-MCG TABS Take 1 tablet by mouth daily.    [provider]  cephALEXin (KEFLEX) 500 MG capsule Take 500 mg by mouth 3 (three) times daily. 03/20/23   [provider]  cholecalciferol (VITAMIN D3) 25 MCG (1000 UNIT) tablet Take 1,000 Units by mouth daily.    [provider]  diclofenac Sodium (VOLTAREN) 1 % GEL Apply 2 g topically 4 (four) times daily. 01/10/23   Dameron, Nolberto Hanlon, DO  feeding supplement (ENSURE ENLIVE / ENSURE PLUS) LIQD Take 237 mLs by mouth 2 (two) times daily between meals. 01/10/23   Dameron, Nolberto Hanlon, DO  furosemide (LASIX) 20 MG tablet Take 20 mg once a day for the next three day's then decrease to 20 mg every other day. 03/22/23   Reather Littler D, NP  gabapentin (NEURONTIN) 300 MG capsule Take 300-600 mg by mouth See admin instructions. Give 300 mg (1 capsule) in the mornings and 600 mg (2  capsules) at bedtime. 04/12/21   [provider]  guaiFENesin (MUCINEX) 600 MG 12 hr tablet Take 600 mg by mouth 2 (two) times daily.    [provider]  Lecithin 1200 MG CAPS Take 1,200 mg by mouth daily.    [provider]  Misc Natural Products (SAW PALMETTO) CAPS Take 600 mg by mouth daily.    [provider]  montelukast (SINGULAIR) 10 MG tablet Take 10 mg by mouth daily.    [provider]  Multiple Vitamin (DAILY VITE) TABS Take 1 tablet by mouth daily.    [provider]  Multiple Vitamins-Minerals (PRESERVISION AREDS 2) CAPS Take 1 capsule by mouth 2 (two) times daily.    [provider]  pantoprazole (PROTONIX) 40 MG tablet TAKE 1 TABLET BY MOUTH DAILY. --DX: GERD 06/07/22   O'Neal, Ronnald Ramp, MD  Probiotic Product [facility] COLON HEALTH) CAPS Take 1 capsule by mouth daily.    [provider]  rivastigmine (EXELON) 3 MG capsule Take 3 mg by mouth 2 (two) times daily. 04/11/20   [provider]  tamsulosin (FLOMAX) 0.4 MG CAPS capsule Take 1 capsule (0.4 mg total) by mouth daily. 01/10/23   Darral Dash, DO      Allergies    Patient has no known allergies.    Review of Systems   Review of Systems  Physical Exam Updated Vital Signs BP 110/77   Pulse 82  Temp 98 F (36.7 C) (Oral)   Resp 18   Ht 5\' 7"  (1.702 m)   Wt 72.2 kg   SpO2 100%   BMI 24.93 kg/m  Physical Exam Constitutional:      General: He is not in acute distress. HENT:     Mouth/Throat:     Mouth: Mucous membranes are moist.  Eyes:     Pupils: Pupils are equal, round, and reactive to light.  Cardiovascular:     Rate and Rhythm: Normal rate and regular rhythm.     Pulses: Normal pulses.     Heart sounds: Normal heart sounds.  Pulmonary:     Effort: Pulmonary effort is normal. No respiratory distress.     Breath sounds: Normal breath sounds.  Abdominal:     Palpations: Abdomen is soft.     Tenderness: There is no  abdominal tenderness. There is no guarding or rebound.  Skin:    General: Skin is warm and dry.     Capillary Refill: Capillary refill takes less than 2 seconds.  Neurological:     Mental Status: He is alert.     Comments: AO x 2 person place     ED Results / Procedures / Treatments   Labs (all labs ordered are listed, but only abnormal results are displayed) Labs Reviewed  CBC WITH DIFFERENTIAL/PLATELET - Abnormal; Notable for the following components:      Result Value   RBC 3.80 (*)    Hemoglobin 11.6 (*)    HCT 36.7 (*)    Lymphs Abs 0.5 (*)    All other components within normal limits  COMPREHENSIVE METABOLIC PANEL - Abnormal; Notable for the following components:   Sodium 134 (*)    CO2 16 (*)    Glucose, Bld 129 (*)    BUN 72 (*)    Creatinine, Ser 1.58 (*)    Calcium 8.4 (*)    Total Protein 6.4 (*)    GFR, Estimated 41 (*)    All other components within normal limits  BLOOD GAS, VENOUS - Abnormal; Notable for the following components:   pCO2, Ven 35 (*)    Bicarbonate 17.6 (*)    Acid-base deficit 7.8 (*)    All other components within normal limits  RESP PANEL BY RT-PCR (RSV, FLU A&B, COVID)  RVPGX2  LIPASE, BLOOD  LACTIC ACID, PLASMA  LACTIC ACID, PLASMA    EKG None  Radiology No results found.  Procedures Procedures    Medications Ordered in ED Medications  sodium chloride 0.9 % bolus 500 mL (500 mLs Intravenous New Bag/Given 05/17/23 0557)    ED Course/ Medical Decision Making/ A&P                                 Medical Decision Making Amount and/or Complexity of Data Reviewed Labs: ordered. Radiology: ordered.   Adrian [name] 89 y.o. presented today for nausea vomiting diarrhea. Working DDx that I considered at this time includes, but not limited to, dehydration, anemia, electrolyte imbalance, urea, encephalopathy, CVA/TIA, dementia, subdural/epidural hematoma, colitis.  R/o DDx: Pending  Review of prior external notes: 03/23/2023  ED  Unique Tests and My Independent Interpretation:  CBC: Unremarkable CMP: CO2 16, BUN 72 VBG: Unremarkable Respiratory panel: Negative Lactic acid: Negative Lipase: Unremarkable CT abdomen pelvis without contrast: Pending CT head without contrast: Pending EKG: Pending  Social Determinants of Health: none  Discussion with Independent Historian: None  Discussion of Management of Tests: None  Risk: Low: based on diagnostic testing/clinical impression and treatment plan  Risk Stratification Score: None  Staffed with Rancour, MD  Plan: On exam patient was in no acute distress with stable vitals.  On exam patient states he is here because his dog told him to come in.  Patient was unable to provide history.  I tried to reach out to patient's nursing facility however nobody answered.  Broad workup will be obtained.  Will get imaging of head and abdomen as patient is unable to provide history.  Will give a little bit of fluid as patient does have moderate pulmonary hypertension based on last echo.  Patient signed out to [place], [place].  Please review their note for the continuation of patient's care.  The plan at this point is follow-up on labs and imaging and see if he can reach [facility] to get further history.  This chart was dictated using voice recognition software.  Despite best efforts to proofread,  errors can occur which can change the documentation meaning.         Final Clinical Impression(s) / ED Diagnoses Final diagnoses:  None    Rx / DC Orders ED Discharge Orders     None         Remi Deter 05/17/23 6644    Glynn Octave, MD 05/17/23 [phone]

## 2023-06-25 NOTE — Progress Notes (Unsigned)
 Cardiology Office Note:  .   Date:  06/26/2023  ID:  Adrian [name], DOB 10/09/1927, MRN [id number] PCP: Cole Daubs, [facility] Providers Cardiologist:  Oneil Bigness, MD Structural Heart:  Arnoldo Lapping, MD{ History of Present Illness: .    Chief Complaint  Patient presents with   Follow-up         Adrian [name] is a 89 y.o. male with history of severe MR s/p clip, persistent Afib, OSA, HTN who presents for follow-up.    History of Present Illness   Adrian [name] is a 89 year old male with HFpEF, severe mitral valve regurgitation and permanent atrial fibrillation who presents with intermittent shortness of breath.  He experiences intermittent shortness of breath, particularly after exertion such as going to the bathroom. Despite having oxygen prescribed by a new primary care physician, he has not used it yet. His oxygen levels have been difficult to measure accurately due to cold extremities, but they have been stable during hospital visits. No chest pain or significant trouble breathing at present.  He is currently on Lasix 20 mg every other day, with adjustments made following a fall. He has a history of severe mitral valve regurgitation, status post mitral clip, permanent atrial fibrillation, coronary artery disease, intracranial hemorrhage, and obstructive sleep apnea. His blood pressure is well controlled, and his heart rate was noted to be 68 bpm, despite an initial incorrect reading due to tremors.  He has had several hospital visits, including one for a stomach bug that caused significant diarrhea. He has also experienced some falls, one of which was described as gentle, occurring when he misjudged the position of his wheelchair. His physical activity has declined, and he is now focusing on transitions rather than walking, with therapists concentrating on these transitions due to his shakiness.  He has a do not resuscitate order in place and has  expressed a desire not to be kept alive extensively, as discussed in depth with a lawyer. He has been less positive about his current quality of life, noting a decline in functional capacity.          Problem List 1. Intracranial hemorrhage -while on coumadin in past 2. HTN 3. Persistent Afib -CHADSVASC=3 (age, HTN) -EF 60-65% -normal MPI 11/2019 ([facility]) 4. OSA 5. Severe mitral regurgitation -P2 flail -mitraclip (06/18/2020)  6. CAD -80% dLAD    ROS: All other ROS reviewed and negative. Pertinent positives noted in the HPI.     Studies Reviewed: Aaron Aas   EKG Interpretation Date/Time:  Monday June 26 2023 10:40:42 EDT Ventricular Rate:  68 PR Interval:    QRS Duration:  88 QT Interval:  426 QTC Calculation: 452 R Axis:   -38  Text Interpretation: Atrial fibrillation with premature ventricular or aberrantly conducted complexes Left axis deviation Low voltage QRS Possible Anterolateral infarct , age undetermined Confirmed by Jackquelyn Mass [phone]) on 06/26/2023 10:41:56 AM   TTE 01/09/2023  1. Left ventricular ejection fraction, by estimation, is 60 to 65%. The  left ventricle has normal function. The left ventricle has no regional  wall motion abnormalities. There is moderate concentric left ventricular  hypertrophy. Left ventricular  diastolic function could not be evaluated.   2. Right ventricular systolic function is low normal. The right  ventricular size is normal. There is moderately elevated pulmonary artery  systolic pressure. The estimated right ventricular systolic pressure is  46.9 mmHg.   3. The mitral valve is s/p TEER. Mild 2+ eccentric mitral valve  regurgitation. No evidence of mitral stenosis.   4. Tricuspid valve regurgitation is moderate.   5. The aortic valve is calcified. There is moderate calcification of the  aortic valve. Aortic valve regurgitation is mild. No aortic stenosis is  present.   6. The inferior vena cava is dilated in size with >50%  respiratory  variability, suggesting right atrial pressure of 8 mmHg.   7. Left atrial size was moderately dilated.   8. Right atrial size was mildly dilated.  Physical Exam:   VS:  BP 133/69 (BP Location: Left Arm, Patient Position: Sitting, Cuff Size: Normal)   Pulse 62   Ht 5\' 7"  (1.702 m)   SpO2 94%   BMI 24.93 kg/m    Wt Readings from Last 3 Encounters:  05/17/23 159 lb 2.8 oz (72.2 kg)  04/24/23 159 lb (72.1 kg)  04/17/23 159 lb (72.1 kg)    GEN: Well nourished, well developed in no acute distress NECK: No JVD; No carotid bruits CARDIAC: irregular rhythm, 3/6 HSM, rubs, gallops RESPIRATORY:  diminished breath sounds bilaterally  ABDOMEN: Soft, non-tender, non-distended EXTREMITIES:  No edema; No deformity  ASSESSMENT AND PLAN: .   Assessment and Plan    Severe mitral valve regurgitation, status post mitral clip Condition well-managed post-mitral clip. Intermittent exertional dyspnea, acceptable volume status. - Continue current management without changes.  Diastolic heart failure Controlled with Lasix 20 mg every other day. Intermittent exertional dyspnea, acceptable volume status. Palliative approach for symptom management. - Continue Lasix 20 mg every other day. - Administer extra doses of Lasix as needed.  Permanent atrial fibrillation Heart rate well-controlled at 68 bpm, no acute issues. - Continue current management. - No AC due to prior ICH  Coronary artery disease (CAD) Managed medically, no angina, well-controlled blood pressure. No aggressive therapy due to advanced age. - Continue medical management of CAD.  Goals of Care Advanced directive and DNR order in place. Prefers conservative, palliative care approach. - Respect advanced directive and DNR order. - Focus on palliative care and symptom management.              Follow-up: Return in about 6 months (around 12/26/2023).   Signed, Gigi Kyle. Rolm Clos, MD, [facility]  [facility]   [address], [address] [place], [place] [address] [phone]  11:09 AM

## 2023-06-26 ENCOUNTER — Ambulatory Visit: Payer: Medicare HMO | Attending: Cardiovascular Disease | Admitting: Cardiovascular Disease

## 2023-06-26 ENCOUNTER — Encounter: Payer: Self-pay | Admitting: Cardiovascular Disease

## 2023-06-26 VITALS — BP 133/69 | HR 62 | Ht 67.0 in

## 2023-06-26 DIAGNOSIS — I340 Nonrheumatic mitral (valve) insufficiency: Secondary | ICD-10-CM | POA: Diagnosis not present

## 2023-06-26 DIAGNOSIS — Z95818 Presence of other cardiac implants and grafts: Secondary | ICD-10-CM

## 2023-06-26 DIAGNOSIS — I4819 Other persistent atrial fibrillation: Secondary | ICD-10-CM | POA: Diagnosis not present

## 2023-06-26 DIAGNOSIS — I5032 Chronic diastolic (congestive) heart failure: Secondary | ICD-10-CM

## 2023-06-26 DIAGNOSIS — I10 Essential (primary) hypertension: Secondary | ICD-10-CM | POA: Diagnosis not present

## 2023-06-26 DIAGNOSIS — Z9889 Other specified postprocedural states: Secondary | ICD-10-CM | POA: Diagnosis not present

## 2023-06-26 NOTE — Patient Instructions (Signed)
 Medication Instructions:  Your physician recommends that you continue on your current medications as directed. Please refer to the Current Medication list given to you today.    *If you need a refill on your cardiac medications before your next appointment, please call your pharmacy*   Lab Work: None    If you have labs (blood work) drawn today and your tests are completely normal, you will receive your results only by: MyChart Message (if you have MyChart) OR A paper copy in the mail If you have any lab test that is abnormal or we need to change your treatment, we will call you to review the results.   Testing/Procedures: None    Follow-Up: At [facility], you and your health needs are our priority.  As part of our continuing mission to provide you with exceptional heart care, we have created designated Provider Care Teams.  These Care Teams include your primary Cardiologist (physician) and Advanced Practice Providers (APPs -  Physician Assistants and Nurse Practitioners) who all work together to provide you with the care you need, when you need it.  We recommend signing up for the patient portal called "MyChart".  Sign up information is provided on this After Visit Summary.  MyChart is used to connect with patients for Virtual Visits (Telemedicine).  Patients are able to view lab/test results, encounter notes, upcoming appointments, etc.  Non-urgent messages can be sent to your provider as well.   To learn more about what you can do with MyChart, go to [email or web address].    Your next appointment:   6 month(s)  The format for your next appointment:   In Person  Provider:   Reatha Harps, MD    Other Instructions

## 2023-08-29 ENCOUNTER — Encounter (HOSPITAL_BASED_OUTPATIENT_CLINIC_OR_DEPARTMENT_OTHER): Payer: Self-pay | Admitting: Cardiovascular Disease

## 2024-01-12 ENCOUNTER — Other Ambulatory Visit (HOSPITAL_COMMUNITY): Payer: Self-pay

## 2024-01-12 MED ORDER — MORPHINE SULFATE (CONCENTRATE) 10 MG /0.5 ML PO SOLN
5.0000 mg | ORAL | 0 refills | Status: DC | PRN
  Filled 2024-01-12: qty 30, 20d supply, fill #0

## 2024-01-22 ENCOUNTER — Other Ambulatory Visit (HOSPITAL_COMMUNITY): Payer: Self-pay

## 2024-01-22 MED ORDER — LORAZEPAM 1 MG PO TABS
1.0000 mg | ORAL_TABLET | Freq: Four times a day (QID) | ORAL | 0 refills | Status: AC
  Filled 2024-01-22: qty 60, 15d supply, fill #0

## 2024-01-22 MED ORDER — LORAZEPAM 1 MG PO TABS
1.0000 mg | ORAL_TABLET | ORAL | 0 refills | Status: AC | PRN
  Filled 2024-01-22: qty 30, 6d supply, fill #0

## 2024-01-22 MED ORDER — MORPHINE SULFATE (CONCENTRATE) 10 MG /0.5 ML PO SOLN: 5.0000 mg | Freq: Four times a day (QID) | ORAL | 0 refills | Status: AC | PRN

## 2024-01-22 MED ORDER — MORPHINE SULFATE (CONCENTRATE) 10 MG /0.5 ML PO SOLN: 5.0000 mg | ORAL | 0 refills | Status: AC | PRN

## 2024-02-01 ENCOUNTER — Other Ambulatory Visit (HOSPITAL_COMMUNITY): Payer: Self-pay

## 2024-02-12 DEATH — deceased

## 2024-03-21 ENCOUNTER — Encounter: Payer: Self-pay | Admitting: Cardiovascular Disease
